# Patient Record
Sex: Female | Born: 1935 | Race: White | Hispanic: No | Marital: Married | Smoking: Former smoker
Health system: Southern US, Community
[De-identification: ages and names within clinical notes are randomized; demographics above are authoritative.]

## PROBLEM LIST (undated history)

## (undated) DIAGNOSIS — R17 Unspecified jaundice: Secondary | ICD-10-CM

## (undated) DIAGNOSIS — E039 Hypothyroidism, unspecified: Secondary | ICD-10-CM

## (undated) DIAGNOSIS — K831 Obstruction of bile duct: Secondary | ICD-10-CM

## (undated) DIAGNOSIS — I10 Essential (primary) hypertension: Secondary | ICD-10-CM

## (undated) DIAGNOSIS — M1990 Unspecified osteoarthritis, unspecified site: Secondary | ICD-10-CM

## (undated) DIAGNOSIS — E079 Disorder of thyroid, unspecified: Secondary | ICD-10-CM

## (undated) DIAGNOSIS — K5790 Diverticulosis of intestine, part unspecified, without perforation or abscess without bleeding: Secondary | ICD-10-CM

## (undated) DIAGNOSIS — T884XXA Failed or difficult intubation, initial encounter: Secondary | ICD-10-CM

## (undated) DIAGNOSIS — C259 Malignant neoplasm of pancreas, unspecified: Secondary | ICD-10-CM

## (undated) DIAGNOSIS — I341 Nonrheumatic mitral (valve) prolapse: Secondary | ICD-10-CM

## (undated) HISTORY — PX: INGUINAL HERNIA REPAIR: SUR1180

## (undated) HISTORY — DX: Diverticulosis of intestine, part unspecified, without perforation or abscess without bleeding: K57.90

## (undated) HISTORY — DX: Nonrheumatic mitral (valve) prolapse: I34.1

## (undated) HISTORY — DX: Disorder of thyroid, unspecified: E07.9

## (undated) HISTORY — DX: Malignant neoplasm of pancreas, unspecified: C25.9

## (undated) HISTORY — DX: Obstruction of bile duct: K83.1

## (undated) HISTORY — PX: BREAST CYST EXCISION: SHX579

## (undated) HISTORY — PX: TONSILLECTOMY: SUR1361

## (undated) HISTORY — PX: BREAST BIOPSY: SHX20

## (undated) HISTORY — DX: Unspecified osteoarthritis, unspecified site: M19.90

## (undated) HISTORY — DX: Essential (primary) hypertension: I10

## (undated) HISTORY — DX: Unspecified jaundice: R17

---

## 1998-01-23 ENCOUNTER — Ambulatory Visit (HOSPITAL_COMMUNITY): Admission: RE | Admit: 1998-01-23 | Discharge: 1998-01-23 | Payer: Self-pay | Admitting: Obstetrics & Gynecology

## 1998-04-10 ENCOUNTER — Other Ambulatory Visit: Admission: RE | Admit: 1998-04-10 | Discharge: 1998-04-10 | Payer: Self-pay | Admitting: Obstetrics & Gynecology

## 1999-01-15 ENCOUNTER — Other Ambulatory Visit: Admission: RE | Admit: 1999-01-15 | Discharge: 1999-01-15 | Payer: Self-pay | Admitting: Obstetrics & Gynecology

## 1999-02-05 ENCOUNTER — Encounter: Payer: Self-pay | Admitting: Family Medicine

## 1999-02-05 ENCOUNTER — Ambulatory Visit (HOSPITAL_COMMUNITY): Admission: RE | Admit: 1999-02-05 | Discharge: 1999-02-05 | Payer: Self-pay | Admitting: Family Medicine

## 1999-04-25 ENCOUNTER — Other Ambulatory Visit: Admission: RE | Admit: 1999-04-25 | Discharge: 1999-04-25 | Payer: Self-pay | Admitting: Obstetrics & Gynecology

## 2000-05-07 ENCOUNTER — Other Ambulatory Visit: Admission: RE | Admit: 2000-05-07 | Discharge: 2000-05-07 | Payer: Self-pay | Admitting: Obstetrics & Gynecology

## 2001-03-11 ENCOUNTER — Ambulatory Visit (HOSPITAL_COMMUNITY): Admission: RE | Admit: 2001-03-11 | Discharge: 2001-03-11 | Payer: Self-pay | Admitting: Obstetrics & Gynecology

## 2001-03-11 ENCOUNTER — Encounter: Payer: Self-pay | Admitting: Obstetrics & Gynecology

## 2001-03-15 ENCOUNTER — Encounter: Admission: RE | Admit: 2001-03-15 | Discharge: 2001-03-15 | Payer: Self-pay | Admitting: Obstetrics & Gynecology

## 2001-03-15 ENCOUNTER — Encounter: Payer: Self-pay | Admitting: Obstetrics & Gynecology

## 2001-06-09 ENCOUNTER — Other Ambulatory Visit: Admission: RE | Admit: 2001-06-09 | Discharge: 2001-06-09 | Payer: Self-pay | Admitting: Obstetrics & Gynecology

## 2002-03-22 ENCOUNTER — Encounter: Payer: Self-pay | Admitting: Obstetrics & Gynecology

## 2002-03-22 ENCOUNTER — Ambulatory Visit (HOSPITAL_COMMUNITY): Admission: RE | Admit: 2002-03-22 | Discharge: 2002-03-22 | Payer: Self-pay | Admitting: Obstetrics & Gynecology

## 2003-04-03 ENCOUNTER — Ambulatory Visit (HOSPITAL_COMMUNITY): Admission: RE | Admit: 2003-04-03 | Discharge: 2003-04-03 | Payer: Self-pay | Admitting: Obstetrics & Gynecology

## 2003-04-03 ENCOUNTER — Encounter: Payer: Self-pay | Admitting: Obstetrics & Gynecology

## 2003-06-15 ENCOUNTER — Other Ambulatory Visit: Admission: RE | Admit: 2003-06-15 | Discharge: 2003-06-15 | Payer: Self-pay | Admitting: Obstetrics & Gynecology

## 2004-04-08 ENCOUNTER — Ambulatory Visit (HOSPITAL_COMMUNITY): Admission: RE | Admit: 2004-04-08 | Discharge: 2004-04-08 | Payer: Self-pay | Admitting: Obstetrics & Gynecology

## 2005-04-09 ENCOUNTER — Ambulatory Visit (HOSPITAL_COMMUNITY): Admission: RE | Admit: 2005-04-09 | Discharge: 2005-04-09 | Payer: Self-pay | Admitting: Obstetrics & Gynecology

## 2005-08-05 ENCOUNTER — Other Ambulatory Visit: Admission: RE | Admit: 2005-08-05 | Discharge: 2005-08-05 | Payer: Self-pay | Admitting: Obstetrics & Gynecology

## 2006-04-14 ENCOUNTER — Ambulatory Visit (HOSPITAL_COMMUNITY): Admission: RE | Admit: 2006-04-14 | Discharge: 2006-04-14 | Payer: Self-pay | Admitting: Obstetrics & Gynecology

## 2006-04-23 ENCOUNTER — Encounter: Admission: RE | Admit: 2006-04-23 | Discharge: 2006-04-23 | Payer: Self-pay | Admitting: Obstetrics & Gynecology

## 2007-01-25 ENCOUNTER — Encounter: Admission: RE | Admit: 2007-01-25 | Discharge: 2007-01-25 | Payer: Self-pay | Admitting: Family Medicine

## 2007-04-27 ENCOUNTER — Ambulatory Visit (HOSPITAL_COMMUNITY): Admission: RE | Admit: 2007-04-27 | Discharge: 2007-04-27 | Payer: Self-pay | Admitting: Obstetrics & Gynecology

## 2008-06-04 ENCOUNTER — Ambulatory Visit (HOSPITAL_COMMUNITY): Admission: RE | Admit: 2008-06-04 | Discharge: 2008-06-04 | Payer: Self-pay | Admitting: Obstetrics & Gynecology

## 2008-08-20 ENCOUNTER — Encounter: Admission: RE | Admit: 2008-08-20 | Discharge: 2008-08-20 | Payer: Self-pay | Admitting: Family Medicine

## 2009-06-05 ENCOUNTER — Ambulatory Visit (HOSPITAL_COMMUNITY): Admission: RE | Admit: 2009-06-05 | Discharge: 2009-06-05 | Payer: Self-pay | Admitting: Obstetrics & Gynecology

## 2010-02-07 ENCOUNTER — Encounter: Admission: RE | Admit: 2010-02-07 | Discharge: 2010-02-07 | Payer: Self-pay | Admitting: Family Medicine

## 2010-03-10 ENCOUNTER — Ambulatory Visit (HOSPITAL_COMMUNITY): Admission: RE | Admit: 2010-03-10 | Discharge: 2010-03-10 | Payer: Self-pay | Admitting: General Surgery

## 2010-06-11 ENCOUNTER — Ambulatory Visit (HOSPITAL_COMMUNITY): Admission: RE | Admit: 2010-06-11 | Discharge: 2010-06-11 | Payer: Self-pay | Admitting: Obstetrics & Gynecology

## 2010-11-01 LAB — DIFFERENTIAL
Eosinophils Absolute: 0.2 10*3/uL (ref 0.0–0.7)
Eosinophils Relative: 4 % (ref 0–5)
Lymphs Abs: 1.2 10*3/uL (ref 0.7–4.0)

## 2010-11-01 LAB — CBC
HCT: 39.3 % (ref 36.0–46.0)
Hemoglobin: 13.4 g/dL (ref 12.0–15.0)
MCHC: 34 g/dL (ref 30.0–36.0)
MCV: 96.5 fL (ref 78.0–100.0)
Platelets: 222 10*3/uL (ref 150–400)
RBC: 4.07 MIL/uL (ref 3.87–5.11)
WBC: 4.7 10*3/uL (ref 4.0–10.5)

## 2010-11-01 LAB — COMPREHENSIVE METABOLIC PANEL
ALT: 30 U/L (ref 0–35)
AST: 30 U/L (ref 0–37)
BUN: 12 mg/dL (ref 6–23)
Calcium: 10.3 mg/dL (ref 8.4–10.5)
Chloride: 104 mEq/L (ref 96–112)
Creatinine, Ser: 0.84 mg/dL (ref 0.4–1.2)
GFR calc Af Amer: >60 mL/min (ref >60)
GFR calc non Af Amer: >60 mL/min (ref >60)
Glucose, Bld: 113 mg/dL — ABNORMAL HIGH (ref 70–99)
Potassium: 4.4 mEq/L (ref 3.5–5.1)
Total Protein: 7.3 g/dL (ref 6.0–8.3)

## 2010-11-01 LAB — SURGICAL PCR SCREEN: Staphylococcus aureus: NEGATIVE

## 2011-05-04 ENCOUNTER — Other Ambulatory Visit (HOSPITAL_COMMUNITY): Payer: Self-pay | Admitting: Obstetrics & Gynecology

## 2011-05-04 DIAGNOSIS — Z1231 Encounter for screening mammogram for malignant neoplasm of breast: Secondary | ICD-10-CM

## 2011-06-15 ENCOUNTER — Ambulatory Visit (HOSPITAL_COMMUNITY)
Admission: RE | Admit: 2011-06-15 | Discharge: 2011-06-15 | Disposition: A | Discharge status: Home / Self Care | Payer: PRIVATE HEALTH INSURANCE | Source: Ambulatory Visit | Attending: Obstetrics & Gynecology | Admitting: Obstetrics & Gynecology

## 2011-06-15 DIAGNOSIS — Z1231 Encounter for screening mammogram for malignant neoplasm of breast: Secondary | ICD-10-CM | POA: Insufficient documentation

## 2012-06-01 ENCOUNTER — Other Ambulatory Visit (HOSPITAL_COMMUNITY): Payer: Self-pay | Admitting: Obstetrics & Gynecology

## 2012-06-01 DIAGNOSIS — Z1231 Encounter for screening mammogram for malignant neoplasm of breast: Secondary | ICD-10-CM

## 2012-06-16 ENCOUNTER — Ambulatory Visit (HOSPITAL_COMMUNITY): Payer: 59

## 2012-06-20 ENCOUNTER — Ambulatory Visit (HOSPITAL_COMMUNITY)
Admission: RE | Admit: 2012-06-20 | Discharge: 2012-06-20 | Disposition: A | Discharge status: Home / Self Care | Payer: Medicare Other | Source: Ambulatory Visit | Attending: Obstetrics & Gynecology | Admitting: Obstetrics & Gynecology

## 2012-06-20 DIAGNOSIS — Z1231 Encounter for screening mammogram for malignant neoplasm of breast: Secondary | ICD-10-CM

## 2012-06-23 ENCOUNTER — Other Ambulatory Visit: Payer: Self-pay | Admitting: Obstetrics & Gynecology

## 2012-06-23 DIAGNOSIS — R928 Other abnormal and inconclusive findings on diagnostic imaging of breast: Secondary | ICD-10-CM

## 2012-07-01 ENCOUNTER — Ambulatory Visit
Admission: RE | Admit: 2012-07-01 | Discharge: 2012-07-01 | Disposition: A | Discharge status: Home / Self Care | Payer: PRIVATE HEALTH INSURANCE | Source: Ambulatory Visit | Attending: Obstetrics & Gynecology | Admitting: Obstetrics & Gynecology

## 2012-07-01 DIAGNOSIS — R928 Other abnormal and inconclusive findings on diagnostic imaging of breast: Secondary | ICD-10-CM

## 2013-06-05 ENCOUNTER — Encounter: Payer: Self-pay | Admitting: Physician Assistant

## 2013-06-12 ENCOUNTER — Encounter: Payer: Self-pay | Admitting: *Deleted

## 2013-06-13 ENCOUNTER — Ambulatory Visit (INDEPENDENT_AMBULATORY_CARE_PROVIDER_SITE_OTHER): Payer: Medicare Other | Admitting: Physician Assistant

## 2013-06-13 ENCOUNTER — Encounter: Payer: Self-pay | Admitting: Physician Assistant

## 2013-06-13 VITALS — BP 112/60 | HR 72 | Ht 60.0 in | Wt 96.0 lb

## 2013-06-13 DIAGNOSIS — Z9889 Other specified postprocedural states: Secondary | ICD-10-CM

## 2013-06-13 DIAGNOSIS — Z8719 Personal history of other diseases of the digestive system: Secondary | ICD-10-CM

## 2013-06-13 DIAGNOSIS — K625 Hemorrhage of anus and rectum: Secondary | ICD-10-CM

## 2013-06-13 DIAGNOSIS — K5730 Diverticulosis of large intestine without perforation or abscess without bleeding: Secondary | ICD-10-CM

## 2013-06-13 DIAGNOSIS — R1084 Generalized abdominal pain: Secondary | ICD-10-CM

## 2013-06-13 DIAGNOSIS — E039 Hypothyroidism, unspecified: Secondary | ICD-10-CM

## 2013-06-13 NOTE — Progress Notes (Signed)
Subjective:    Patient ID: Summer Jones, female    DOB: 08-Jun-1930, 79 y.o.   MRN: [id number]  HPI  Summer Jones  is a very nice 79 year old white female known to Dr. Lina Sar from previous procedures. She last had colonoscopy in June of 2005 which was done for routine screening. She was noted to have mild right and left colon diverticulosis, no polyps were found. She is generally in good health does have history of hypertension and hypothyroidism. She is referred today by Dr. Waynard Edwards for evaluation of midabdominal pain and intermittent rectal bleeding. Patient relates that she had an episode of beginning of October with a small amount of mucus and blood noted in her bowel movement and on the tissue. She says this occurred over a couple of days resolved and then recurred about a week ago. She says when symptoms started last week it was just a small amount of bright red blood in noted primarily on the tissue. She says after that she began having abdominal pain which is now been bothering her "sporadically" over the past week. She describes it as a mid abdominal aching below her belly button. She had tried Zantac on one occasion which she felt was helpful. She has not had any associated fever or chills changes in her bowel habits with melena or hematochezia. She says she has noted that she is uncomfortable with eating over the past week and that she gets a lot of gurgling and then midabdominal discomfort. Over the past few days she has not seen any blood. She has no urinary symptoms. She denies nausea and vomiting, appetite recently has been stable as has her weight. She had been seen in Dr. Laurey Morale office last week and had labs done which were unremarkable including CBC and be met. She also had a rectal exam done was noted to be Hemoccult negative.    Review of Systems  Constitutional: Negative.   HENT: Negative.   Eyes: Negative.   Respiratory: Negative.   Cardiovascular: Negative.   Gastrointestinal:  Positive for abdominal pain and anal bleeding.  Endocrine: Negative.   Genitourinary: Negative.   Musculoskeletal: Negative.   Allergic/Immunologic: Negative.   Neurological: Negative.   Hematological: Negative.   Psychiatric/Behavioral: Negative.    Outpatient Encounter Prescriptions as of 06/13/2013  Medication Sig Dispense Refill  . AMLODIPINE BESYLATE PO Take 10 mg by mouth daily.      Marland Kitchen levothyroxine (SYNTHROID, LEVOTHROID) 25 MCG tablet Take 25 mcg by mouth daily before breakfast.      . lisinopril (PRINIVIL,ZESTRIL) 5 MG tablet Take 5 mg by mouth daily.       No facility-administered encounter medications on file as of 06/13/2013.       No Known Allergies Patient Active Problem List   Diagnosis Date Noted  . Unspecified hypothyroidism 06/13/2013  . Diverticulosis of colon without hemorrhage 06/13/2013  . S/P hernia repair 06/13/2013   family history includes Breast cancer in her sister; Heart disease in her father. History  Substance Use Topics  . Smoking status: Never Smoker   . Smokeless tobacco: Not on file  . Alcohol Use: Yes     Comment: 3-4 per week     Objective:   Physical Exam Well-developed elderly white female in no acute distress, quite pleasant blood pressure 112/60 pulse 72 height 5 foot weight 96. HEENT nontraumatic normocephalic EOMI PERRLA sclera anicteric, Supple no JVD, Cardiovascular regular rate and rhythm with S1-S2 no murmur or gallop, Pulmonary clear bilaterally, Abdomen soft  nondistended no palpable mass or hepatosplenomegaly bowel sounds are somewhat hyperactive she does not have any focal abdominal tenderness no guarding or rebound, Rectal exam external exam no external hemorrhoids internal exam was not done, this was done last week and Dr. Laurey Morale  office and she was Hemoccult-negative but felt to have internal hemorrhoids, Extremities no clubbing cyanosis or edema skin warm and dry, Psych mood and affect normal and appropriate        Assessment & Plan:  #91 79 year old female with a several week history of symptoms initially with small amounts of blood and mucus in her stools and now development of intermittent mid abdominal pain x1-2 weeks. Etiology of her symptoms is unclear. She does have diverticulosis however her exam is not consistent with diverticulitis. Last colonoscopy was done 9 years ago. Will need to rule out occult colon lesion or other intra-abdominal inflammatory process  #2 hypertension #3 hypothyroidism  Plan; Will schedule for CT scan of the abdomen and pelvis with contrast. If this is unrevealing she will need colonoscopy with Dr. Juanda Chance Further plans pending results of CT.

## 2013-06-13 NOTE — Progress Notes (Deleted)
Patient ID: Summer Jones, female   DOB: 03-28-1930, 79 y.o.   MRN: [id number]

## 2013-06-13 NOTE — Progress Notes (Signed)
Reviewed and agree.

## 2013-06-13 NOTE — Patient Instructions (Signed)
  You have been scheduled for a CT scan of the abdomen and pelvis at [facility] ([address]---this is in the same building as [facility]).   You are scheduled on 06-19-2013 at 3:30 PM . You should arrive at 3:15 PM prior to your appointment time for registration. Please follow the written instructions below on the day of your exam:  WARNING: IF YOU ARE ALLERGIC TO IODINE/X-RAY DYE, PLEASE NOTIFY RADIOLOGY IMMEDIATELY AT [phone]! YOU WILL BE GIVEN A 13 HOUR PREMEDICATION PREP.  1) Do not eat or drink anything after 11:30 am (4 hours prior to your test) 2) You have been given  bottle of oral contrast to drink. The solution may taste better if refrigerated, but do NOT add ice or any other liquid to this solution. Shake well before drinking.    Drink  1/2 bottle of contrast @ 1:30 PM  (2 hours prior to your exam)  Drink 1/2 bottle of contrast @ 2:30 PM  (1 hour prior to your exam)  You may take any medications as prescribed with a small amount of water except for the following: Metformin, Glucophage, Glucovance, Avandamet, Riomet, Fortamet, Actoplus Met, Janumet, Glumetza or Metaglip. The above medications must be held the day of the exam AND 48 hours after the exam.  The purpose of you drinking the oral contrast is to aid in the visualization of your intestinal tract. The contrast solution may cause some diarrhea. Before your exam is started, you will be given a small amount of fluid to drink. Depending on your individual set of symptoms, you may also receive an intravenous injection of x-ray contrast/dye. Plan on being at [facility] for 30 minutes or long, depending on the type of exam you are having performed.  If you have any questions regarding your exam or if you need to reschedule, you may call the CT department at [phone] between the hours of 8:00 am and 5:00 pm,  Monday-Friday.  ________________________________________________________________________

## 2013-06-19 ENCOUNTER — Ambulatory Visit (INDEPENDENT_AMBULATORY_CARE_PROVIDER_SITE_OTHER)
Admission: RE | Admit: 2013-06-19 | Discharge: 2013-06-19 | Disposition: A | Discharge status: Home / Self Care | Payer: Medicare Other | Source: Ambulatory Visit | Attending: Physician Assistant | Admitting: Physician Assistant

## 2013-06-19 DIAGNOSIS — R1084 Generalized abdominal pain: Secondary | ICD-10-CM

## 2013-06-19 DIAGNOSIS — K625 Hemorrhage of anus and rectum: Secondary | ICD-10-CM

## 2013-06-19 MED ORDER — IOHEXOL 300 MG/ML  SOLN
80.0000 mL | Freq: Once | INTRAMUSCULAR | Status: AC | PRN
  Administered 2013-06-19: 80 mL via INTRAVENOUS

## 2013-08-30 ENCOUNTER — Other Ambulatory Visit (HOSPITAL_COMMUNITY): Payer: Self-pay | Admitting: Obstetrics & Gynecology

## 2013-08-30 DIAGNOSIS — Z1231 Encounter for screening mammogram for malignant neoplasm of breast: Secondary | ICD-10-CM

## 2013-09-13 ENCOUNTER — Ambulatory Visit (HOSPITAL_COMMUNITY): Payer: Medicare Other

## 2013-10-25 ENCOUNTER — Encounter: Payer: Self-pay | Admitting: Internal Medicine

## 2013-11-21 ENCOUNTER — Ambulatory Visit (HOSPITAL_COMMUNITY)
Admission: RE | Admit: 2013-11-21 | Discharge: 2013-11-21 | Disposition: A | Discharge status: Home / Self Care | Payer: Medicare Other | Source: Ambulatory Visit | Attending: Obstetrics & Gynecology | Admitting: Obstetrics & Gynecology

## 2013-11-21 DIAGNOSIS — Z1231 Encounter for screening mammogram for malignant neoplasm of breast: Secondary | ICD-10-CM | POA: Insufficient documentation

## 2013-12-26 ENCOUNTER — Ambulatory Visit (INDEPENDENT_AMBULATORY_CARE_PROVIDER_SITE_OTHER): Payer: Medicare Other | Admitting: Internal Medicine

## 2013-12-26 ENCOUNTER — Encounter: Payer: Self-pay | Admitting: Internal Medicine

## 2013-12-26 VITALS — BP 100/60 | HR 64 | Ht 60.0 in | Wt 96.0 lb

## 2013-12-26 DIAGNOSIS — Z1211 Encounter for screening for malignant neoplasm of colon: Secondary | ICD-10-CM

## 2013-12-26 NOTE — Progress Notes (Signed)
Summer Jones [birth year] [id number]  Note: This dictation was prepared with Dragon digital system. Any transcriptional errors that result from this procedure are unintentional.   History of Present Illness:  This is an 80 year old, white female who is here to discuss a screening colonoscopy. Her last colon exam in June 2005 showed diverticulosis of the left and right colon. She is asymptomatic. Her last office visit was in October 2014 with Nicoletta Ba, PA-C for acute abdominal pain and rectal bleeding. A CT scan of the abdomen showed a small hiatal hernia and diverticulosis but no acute process. She has been feeling well having normal bowel movements and having a good appetite. There is a family history of colorectal cancer in her maternal uncle and maternal aunt who were both in their 16s. Both relatives are still alive at age 70. Patient is hesitant to undergo screening colonoscopy     Past Medical History  Diagnosis Date  . Diverticular disease   . Arthritis   . Diverticulosis   . Hypertension   . Thyroid disease   . Mitral valve prolapse     Past Surgical History  Procedure Laterality Date  . Inguinal hernia repair      right  . Breast biopsy    . Tonsillectomy      No Known Allergies  Family history and social history have been reviewed.  Review of Systems:   The remainder of the 10 point ROS is negative except as outlined in the H&P  Physical Exam: General Appearance Well developed, in no distress Eyes  Non icteric  HEENT  Non traumatic, normocephalic  Mouth No lesion, tongue papillated, no cheilosis Neck Supple without adenopathy, thyroid not enlarged, no carotid bruits, no JVD Lungs Clear to auscultation bilaterally, mild kyphosis of the thoracic spine COR Normal S1, normal S2, regular rhythm, no murmur, quiet precordium Abdomen soft nontender abdomen with normal active bowel sounds Rectal small amount of formed Hemoccult-negative stool Extremities  No pedal  edema Skin No lesions Neurological Alert and oriented x 3 Psychological Normal mood and affect  Assessment and Plan:   Problem #19 80 year old white female here to discuss  screening colonoscopy. Her last exam was 10 years ago. She has no risk factors for colon cancer such colon cancer in a direct relative. She is Hemoccult negative. From my standpoint it it acceptable not to plan  a recall colonoscopy as long as she is aware of the risks of missing colon polyps or possibly a cancer.. We have discussed a high fiber diet and followup with Dr. Joylene Draft for preventive medical care. I'll be happy to see her in the future.    [name] 12/26/2013

## 2013-12-26 NOTE — Patient Instructions (Addendum)
Dr Joylene Draft

## 2015-01-21 ENCOUNTER — Other Ambulatory Visit (HOSPITAL_COMMUNITY): Payer: Self-pay | Admitting: Obstetrics & Gynecology

## 2015-01-21 DIAGNOSIS — Z1231 Encounter for screening mammogram for malignant neoplasm of breast: Secondary | ICD-10-CM

## 2015-02-06 ENCOUNTER — Ambulatory Visit (HOSPITAL_COMMUNITY)
Admission: RE | Admit: 2015-02-06 | Discharge: 2015-02-06 | Disposition: A | Discharge status: Home / Self Care | Payer: Medicare Other | Source: Ambulatory Visit | Attending: Obstetrics & Gynecology | Admitting: Obstetrics & Gynecology

## 2015-02-06 DIAGNOSIS — Z1231 Encounter for screening mammogram for malignant neoplasm of breast: Secondary | ICD-10-CM | POA: Diagnosis not present

## 2015-08-06 ENCOUNTER — Encounter (HOSPITAL_COMMUNITY): Payer: Self-pay | Admitting: *Deleted

## 2015-08-06 ENCOUNTER — Inpatient Hospital Stay (HOSPITAL_COMMUNITY)
Admission: EM | Admit: 2015-08-06 | Discharge: 2015-08-10 | DRG: 444 | Disposition: A | Discharge status: Home Health | Payer: Medicare Other | Attending: Internal Medicine | Admitting: Internal Medicine

## 2015-08-06 ENCOUNTER — Emergency Department (HOSPITAL_COMMUNITY): Payer: Medicare Other

## 2015-08-06 DIAGNOSIS — K7200 Acute and subacute hepatic failure without coma: Secondary | ICD-10-CM | POA: Diagnosis present

## 2015-08-06 DIAGNOSIS — T8386XA Thrombosis of genitourinary prosthetic devices, implants and grafts, initial encounter: Secondary | ICD-10-CM | POA: Diagnosis not present

## 2015-08-06 DIAGNOSIS — M1990 Unspecified osteoarthritis, unspecified site: Secondary | ICD-10-CM | POA: Diagnosis present

## 2015-08-06 DIAGNOSIS — D630 Anemia in neoplastic disease: Secondary | ICD-10-CM

## 2015-08-06 DIAGNOSIS — Z681 Body mass index (BMI) 19 or less, adult: Secondary | ICD-10-CM

## 2015-08-06 DIAGNOSIS — R739 Hyperglycemia, unspecified: Secondary | ICD-10-CM | POA: Diagnosis present

## 2015-08-06 DIAGNOSIS — I341 Nonrheumatic mitral (valve) prolapse: Secondary | ICD-10-CM | POA: Diagnosis present

## 2015-08-06 DIAGNOSIS — I10 Essential (primary) hypertension: Secondary | ICD-10-CM | POA: Diagnosis present

## 2015-08-06 DIAGNOSIS — R630 Anorexia: Secondary | ICD-10-CM | POA: Diagnosis present

## 2015-08-06 DIAGNOSIS — Y838 Other surgical procedures as the cause of abnormal reaction of the patient, or of later complication, without mention of misadventure at the time of the procedure: Secondary | ICD-10-CM | POA: Diagnosis not present

## 2015-08-06 DIAGNOSIS — D649 Anemia, unspecified: Secondary | ICD-10-CM | POA: Diagnosis present

## 2015-08-06 DIAGNOSIS — E43 Unspecified severe protein-calorie malnutrition: Secondary | ICD-10-CM | POA: Diagnosis present

## 2015-08-06 DIAGNOSIS — K831 Obstruction of bile duct: Principal | ICD-10-CM | POA: Diagnosis present

## 2015-08-06 DIAGNOSIS — K5900 Constipation, unspecified: Secondary | ICD-10-CM | POA: Diagnosis not present

## 2015-08-06 DIAGNOSIS — D473 Essential (hemorrhagic) thrombocythemia: Secondary | ICD-10-CM | POA: Diagnosis present

## 2015-08-06 DIAGNOSIS — R17 Unspecified jaundice: Secondary | ICD-10-CM | POA: Insufficient documentation

## 2015-08-06 DIAGNOSIS — C801 Malignant (primary) neoplasm, unspecified: Secondary | ICD-10-CM

## 2015-08-06 DIAGNOSIS — E039 Hypothyroidism, unspecified: Secondary | ICD-10-CM | POA: Diagnosis present

## 2015-08-06 DIAGNOSIS — E876 Hypokalemia: Secondary | ICD-10-CM | POA: Diagnosis present

## 2015-08-06 DIAGNOSIS — R109 Unspecified abdominal pain: Secondary | ICD-10-CM

## 2015-08-06 HISTORY — DX: Failed or difficult intubation, initial encounter: T88.4XXA

## 2015-08-06 LAB — COMPREHENSIVE METABOLIC PANEL
ALT: 361 U/L — ABNORMAL HIGH (ref 14–54)
AST: 273 U/L — AB (ref 15–41)
Albumin: 4.2 g/dL (ref 3.5–5.0)
Alkaline Phosphatase: 862 U/L — ABNORMAL HIGH (ref 38–126)
Anion gap: 11 (ref 5–15)
BUN: 8 mg/dL (ref 6–20)
CHLORIDE: 97 mmol/L — AB (ref 101–111)
CO2: 26 mmol/L (ref 22–32)
Calcium: 9.8 mg/dL (ref 8.9–10.3)
Creatinine, Ser: 0.36 mg/dL — ABNORMAL LOW (ref 0.44–1.00)
GFR calc non Af Amer: >60 mL/min (ref >60)
Glucose, Bld: 142 mg/dL — ABNORMAL HIGH (ref 65–99)
POTASSIUM: 3.6 mmol/L (ref 3.5–5.1)
Sodium: 134 mmol/L — ABNORMAL LOW (ref 135–145)
Total Bilirubin: 16.6 mg/dL — ABNORMAL HIGH (ref 0.3–1.2)
Total Protein: 7.5 g/dL (ref 6.5–8.1)

## 2015-08-06 LAB — CBC WITH DIFFERENTIAL/PLATELET
BASOS ABS: 0.1 10*3/uL (ref 0.0–0.1)
Basophils Relative: 1 %
EOS PCT: 1 %
Eosinophils Absolute: 0 10*3/uL (ref 0.0–0.7)
HCT: 31.8 % — ABNORMAL LOW (ref 36.0–46.0)
Hemoglobin: 11 g/dL — ABNORMAL LOW (ref 12.0–15.0)
LYMPHS PCT: 12 %
Lymphs Abs: 0.6 10*3/uL — ABNORMAL LOW (ref 0.7–4.0)
MCH: 32.4 pg (ref 26.0–34.0)
MCHC: 34.6 g/dL (ref 30.0–36.0)
MCV: 93.8 fL (ref 78.0–100.0)
Monocytes Absolute: 0.5 10*3/uL (ref 0.1–1.0)
Monocytes Relative: 10 %
Neutro Abs: 3.8 10*3/uL (ref 1.7–7.7)
Neutrophils Relative %: 76 %
Platelets: 411 10*3/uL — ABNORMAL HIGH (ref 150–400)
RBC: 3.39 MIL/uL — ABNORMAL LOW (ref 3.87–5.11)
RDW: 15.9 % — ABNORMAL HIGH (ref 11.5–15.5)
WBC: 5 10*3/uL (ref 4.0–10.5)

## 2015-08-06 LAB — I-STAT CHEM 8, ED
BUN: 8 mg/dL (ref 6–20)
CALCIUM ION: 1.15 mmol/L (ref 1.13–1.30)
CHLORIDE: 97 mmol/L — AB (ref 101–111)
CREATININE: 0.6 mg/dL (ref 0.44–1.00)
Glucose, Bld: 139 mg/dL — ABNORMAL HIGH (ref 65–99)
HCT: 40 % (ref 36.0–46.0)
Hemoglobin: 13.6 g/dL (ref 12.0–15.0)
POTASSIUM: 4.7 mmol/L (ref 3.5–5.1)
Sodium: 133 mmol/L — ABNORMAL LOW (ref 135–145)
TCO2: 26 mmol/L (ref 0–100)

## 2015-08-06 LAB — APTT: aPTT: 27 seconds (ref 24–37)

## 2015-08-06 LAB — PROTIME-INR
INR: 0.95 (ref 0.00–1.49)
PROTHROMBIN TIME: 12.8 s (ref 11.6–15.2)

## 2015-08-06 LAB — LIPASE, BLOOD: Lipase: 55 U/L — ABNORMAL HIGH (ref 11–51)

## 2015-08-06 LAB — BILIRUBIN, DIRECT: Bilirubin, Direct: 9.2 mg/dL — ABNORMAL HIGH (ref 0.1–0.5)

## 2015-08-06 MED ORDER — POTASSIUM CHLORIDE IN NACL 40-0.9 MEQ/L-% IV SOLN
INTRAVENOUS | Status: DC
Start: 2015-08-06 — End: 2015-08-09
  Administered 2015-08-06 – 2015-08-08 (×3): 75 mL/h via INTRAVENOUS
  Filled 2015-08-06 (×7): qty 1000

## 2015-08-06 MED ORDER — IOHEXOL 300 MG/ML  SOLN
100.0000 mL | Freq: Once | INTRAMUSCULAR | Status: AC | PRN
  Administered 2015-08-06: 100 mL via INTRAVENOUS

## 2015-08-06 MED ORDER — SODIUM CHLORIDE 0.9 % IV SOLN
INTRAVENOUS | Status: DC
  Administered 2015-08-06: 19:00:00 via INTRAVENOUS

## 2015-08-06 MED ORDER — HYDROCODONE-ACETAMINOPHEN 5-325 MG PO TABS
1.0000 | ORAL_TABLET | ORAL | Status: DC | PRN
  Administered 2015-08-09 (×2): 1 via ORAL
  Filled 2015-08-06 (×2): qty 1

## 2015-08-06 MED ORDER — LISINOPRIL 5 MG PO TABS
5.0000 mg | ORAL_TABLET | Freq: Every day | ORAL | Status: DC
  Administered 2015-08-07 – 2015-08-09 (×2): 5 mg via ORAL
  Filled 2015-08-06 (×5): qty 1

## 2015-08-06 MED ORDER — LEVOTHYROXINE SODIUM 25 MCG PO TABS
25.0000 ug | ORAL_TABLET | Freq: Every day | ORAL | Status: DC
  Administered 2015-08-09 – 2015-08-10 (×2): 25 ug via ORAL
  Filled 2015-08-06 (×5): qty 1

## 2015-08-06 MED ORDER — HEPARIN SODIUM (PORCINE) 5000 UNIT/ML IJ SOLN
5000.0000 [IU] | Freq: Three times a day (TID) | INTRAMUSCULAR | Status: DC
  Filled 2015-08-06 (×14): qty 1

## 2015-08-06 MED ORDER — POLYETHYLENE GLYCOL 3350 17 G PO PACK
17.0000 g | PACK | Freq: Every day | ORAL | Status: DC | PRN
  Filled 2015-08-06: qty 1

## 2015-08-06 MED ORDER — ONDANSETRON HCL 4 MG/2ML IJ SOLN
4.0000 mg | Freq: Four times a day (QID) | INTRAMUSCULAR | Status: DC | PRN
  Administered 2015-08-09: 4 mg via INTRAVENOUS
  Filled 2015-08-06: qty 2

## 2015-08-06 MED ORDER — ACETAMINOPHEN 325 MG PO TABS: 650.0000 mg | ORAL_TABLET | Freq: Four times a day (QID) | ORAL | Status: DC | PRN

## 2015-08-06 MED ORDER — ALUM & MAG HYDROXIDE-SIMETH 200-200-20 MG/5ML PO SUSP
30.0000 mL | Freq: Four times a day (QID) | ORAL | Status: DC | PRN
  Administered 2015-08-08 – 2015-08-09 (×2): 30 mL via ORAL
  Filled 2015-08-06 (×2): qty 30

## 2015-08-06 MED ORDER — ONDANSETRON HCL 4 MG PO TABS: 4.0000 mg | ORAL_TABLET | Freq: Four times a day (QID) | ORAL | Status: DC | PRN

## 2015-08-06 MED ORDER — AMLODIPINE BESYLATE 10 MG PO TABS
10.0000 mg | ORAL_TABLET | Freq: Every day | ORAL | Status: DC
  Administered 2015-08-07 – 2015-08-09 (×2): 10 mg via ORAL
  Filled 2015-08-06 (×5): qty 1

## 2015-08-06 MED ORDER — IOHEXOL 300 MG/ML  SOLN
50.0000 mL | Freq: Once | INTRAMUSCULAR | Status: DC | PRN
  Administered 2015-08-06: 50 mL via ORAL
  Filled 2015-08-06: qty 50

## 2015-08-06 MED ORDER — MULTIVITAMIN GUMMIES ADULT PO CHEW: 1.0000 | CHEWABLE_TABLET | Freq: Every day | ORAL | Status: DC

## 2015-08-06 MED ORDER — ACETAMINOPHEN 650 MG RE SUPP: 650.0000 mg | Freq: Four times a day (QID) | RECTAL | Status: DC | PRN

## 2015-08-06 MED ORDER — ADULT MULTIVITAMIN W/MINERALS CH
1.0000 | ORAL_TABLET | Freq: Every day | ORAL | Status: DC
  Administered 2015-08-09: 1 via ORAL
  Filled 2015-08-06 (×5): qty 1

## 2015-08-06 NOTE — H&P (Addendum)
[facility] History and Physical  Summer Jones [id number] DOB: 1930/02/10 DOA: 08/06/2015  Referring physician: ED physician PCP: [name], MD  Specialists:   Chief Complaint: Painless jaundice  HPI: Summer Jones is a generally very healthy 81 y.o. female with PMH of hypothyroidism, osteoarthritis, and mitral valve prolapse who presents to the ED at the direction of her primary care physician for evaluation of painless jaundice of 1 week's duration. Mr. Loughrey notes that she had been in her usual state of good health until 2-4 weeks ago when her appetite dropped off and she experienced some general malaise. She notes a 5-10 pound weight loss over this interval, which she attributes to her poor appetite. It was not until yesterday that the patient realized she had become jaundiced and saw her PCP for evaluation. She was sent for lab work that reportedly featured elevated LFTs and she was directed to the ED for further evaluation. She denies any abdominal pain, nausea, or vomiting. She denies fevers, chills, or easy bruising or bleeding. She consumes alcohol only occasionally and denies history of known gallstones. There is a family history of GI malignancy in two 3rd relatives.  In ED, patient was found to be afebrile, saturating well on room air, and with vital signs stable. Initial lab work feature and marked elevations in alkaline phosphatase, AST, ALT, and with total bilirubin elevated to 16.6. CBC was notable for a new normocytic anemia and thrombocytosis. Patient was sent for contrast-enhanced CT of the abdomen, revealing intra- and extrahepatic biliary dilation with abrupt cut off at the site of CBD entry into the pancreas. Dr. Hilarie Fredrickson or use of [facility] was consulted from the emergency department and recommended admission with tentative plans for endoscopic evaluation.   Where does patient live?   At home     Can patient participate in ADLs?  Yes       Review of Systems:    General: no fevers, chills, or sweats. Unintentional weight loss and anorexia. Malaise. HEENT: no blurry vision, hearing changes or sore throat might be  Pulm: no dyspnea, cough, or wheeze CV: no chest pain or palpitations Abd: no nausea, vomiting, abdominal pain, diarrhea, or constipation GU: no dysuria, hematuria, increased urinary frequency, or urgency  Ext: no leg edema Neuro: no focal weakness, numbness, or tingling, no vision change or hearing loss Skin: no rash, no wounds. Jaundiced throughout  MSK: No muscle spasm, no deformity, no red, hot, or swollen joint Heme: No easy bruising or bleeding Travel history: No recent long distant travel    Allergy: No Known Allergies  Past Medical History  Diagnosis Date  . Diverticular disease   . Arthritis   . Diverticulosis   . Hypertension   . Thyroid disease   . Mitral valve prolapse     Past Surgical History  Procedure Laterality Date  . Inguinal hernia repair      right  . Breast biopsy    . Tonsillectomy      Social History:  reports that she has never smoked. She has never used smokeless tobacco. She reports that she drinks alcohol. She reports that she does not use illicit drugs.  Family History:  Family History  Problem Relation Age of Onset  . Breast cancer Sister   . Heart disease Father      Prior to Admission medications   Medication Sig Start Date End Date Taking? Authorizing Provider  amLODipine (NORVASC) 10 MG tablet  06/16/15  Yes Historical Provider, MD  levothyroxine (SYNTHROID, LEVOTHROID) 25 MCG tablet Take 25 mcg by mouth daily before breakfast.   Yes Historical Provider, MD  lisinopril (PRINIVIL,ZESTRIL) 5 MG tablet Take 5 mg by mouth daily.   Yes Historical Provider, MD  Multiple Vitamins-Minerals (MULTIVITAMIN GUMMIES ADULT) CHEW Chew 1 tablet by mouth daily.   Yes Historical Provider, MD    Physical Exam: Filed Vitals:   08/06/15 1353 08/06/15 1821  BP: 142/64 161/86  Pulse: 82 78  Temp:  97.7 F (36.5 C)   TempSrc: Oral   Resp: 18 16  SpO2: 98% 98%   General: Not in acute distress HEENT:       Eyes: PERRL, EOMI, no conjunctival pallor. icteric sclerae       ENT: No discharge from the ears or nose, no pharyngeal ulcers, petechiae or exudate, no tonsillar enlargement.        Neck: No bruit, no appreciable mass. Modest neck vein distention  Heme: No cervical adenopathy, no pallor Cardiac: S1/S2, RRR, No murmurs, No gallops or rubs. Pulm: Good air movement bilaterally. No rales, wheezing, rhonchi or rubs. Abd: Soft, nondistended, nontender, no rebound pain or gaurding, no mass or organomegaly, BS present. Ext: No LE edema bilaterally. 2+DP/PT pulse bilaterally. Musculoskeletal: No gross deformity, no red, hot, swollen joints, no limitation in ROM  Skin: No rashes or wounds on exposed surfaces. Jaundiced throughout   Neuro: Alert, oriented X3, cranial nerves II-XII grossly intact. No focal findings Psych: Patient is not overtly psychotic.  Labs on Admission:  Basic Metabolic Panel:  Recent Labs Lab 08/06/15 1428 08/06/15 1429  NA 133* 134*  K 4.7 3.6  CL 97* 97*  CO2  --  26  GLUCOSE 139* 142*  BUN 8 8  CREATININE 0.60 0.36*  CALCIUM  --  9.8   Liver Function Tests:  Recent Labs Lab 08/06/15 1429  AST 273*  ALT 361*  ALKPHOS 862*  BILITOT 16.6*  PROT 7.5  ALBUMIN 4.2    Recent Labs Lab 08/06/15 1429  LIPASE 55*   No results for input(s): AMMONIA in the last 168 hours. CBC:  Recent Labs Lab 08/06/15 1428 08/06/15 1429  WBC  --  5.0  NEUTROABS  --  3.8  HGB 13.6 11.0*  HCT 40.0 31.8*  MCV  --  93.8  PLT  --  411*   Cardiac Enzymes: No results for input(s): CKTOTAL, CKMB, CKMBINDEX, TROPONINI in the last 168 hours.  BNP (last 3 results) No results for input(s): BNP in the last 8760 hours.  ProBNP (last 3 results) No results for input(s): PROBNP in the last 8760 hours.  CBG: No results for input(s): GLUCAP in the last 168  hours.  Radiological Exams on Admission: Ct Abdomen Pelvis W Contrast  08/06/2015  CLINICAL DATA:  Increasing fatigue since October. Yesterday patient becmae jaundiced. Elevated liver function studies. EXAM: CT ABDOMEN AND PELVIS WITH CONTRAST TECHNIQUE: Multidetector CT imaging of the abdomen and pelvis was performed using the standard protocol following bolus administration of intravenous contrast. CONTRAST:  139mL OMNIPAQUE IOHEXOL 300 MG/ML  SOLN COMPARISON:  CT scan 06/19/2013 FINDINGS: Lower chest: The lung bases are clear of acute process. No pleural effusion or pulmonary nodules. The heart is normal in size for age. No pericardial effusion. The small hiatal hernia is noted. Hepatobiliary: Significant intrahepatic biliary dilatation. The common hepatic duct is also dilated and there is an abrupt cut off of the common bile duct as it enters the pancreatic head area. Findings could be due to a  common bile duct stone, biliary tumor (cholangiocarcinoma) or possibly a pancreatic lesion. I believe a biliary tumor is most likely. The pancreatic duct is normal and I do not see an obvious pancreatic mass. MRI abdomen/ MRCP may be helpful for further evaluation if the patient can hold her breath adequately. Otherwise, ERCP may be helpful for both diagnostic and therapeutic purposes if the obstruction can be crossed. No focal hepatic lesions are identified. The portal and hepatic veins are patent. Pancreas: No definite pancreatic mass or inflammation. The pancreatic duct is normal in caliber and has a normal course. Spleen: Normal size.  No focal lesions. Adrenals/Urinary Tract: The adrenal glands and kidneys are normal. Stomach/Bowel: The stomach, duodenum, small bowel and colon are unremarkable. No inflammatory changes, mass lesions or obstructive findings. Diffuse colonic diverticulosis but no findings for acute diverticulitis. The terminal ileum is normal. The appendix is normal. Vascular/Lymphatic: No  mesenteric or retroperitoneal mass or adenopathy. Specifically, I do not see any portal or periportal mass or adenopathy. Scattered atherosclerotic calcifications involving the aorta but fairly minimal for age. The major venous structures are patent. Reproductive: The uterus and ovaries are normal. Other: The bladder is normal. No pelvic mass, adenopathy or free pelvic fluid collections. No inguinal mass or adenopathy. Musculoskeletal: Left convex thoracolumbar scoliosis and associated degenerative lumbar spondylosis. No acute bony findings or destructive bony changes. IMPRESSION: 1. Intra and extrahepatic biliary dilatation with an abrupt cut off of the common bile duct near its entry into the pancreatic head. Findings most suspicious for a biliary tumor (cholangiocarcinoma) but a common bile duct stone and pancreatic lesion are also possible. MRCP may be helpful for further evaluation if the patient can hold her breath. Otherwise, ERCP is suggested for both diagnostic and therapeutic purposes. 2. Moderate distention of the gallbladder without obvious gallstones. 3. No liver lesions or abdominal lymphadenopathy. Electronically Signed   By: Marijo Sanes M.D.   On: 08/06/2015 17:34    EKG:   Not done in ED, will obtain as appropriate   Assessment/Plan  1.  Painless jaundice, biliary obstruction  - CT abdomen demonstrates intra- and extrahepatic biliary dilatation with abrupt cut off with her CBD enters head of pancreas  - Unfortunately, the differential in this case is dominated by malignancy, possibly cholangiocarcinoma or pancreatic cancer, with obstructing stone less likely -  GI was consulted from the emergency department and much appreciated, recommending admission to the hospital for further workup - Supportive care, trend LFTs, check INR - We'll keep nothing by mouth after midnight for possible endoscopy  2. Normocytic anemia  - Mild, normocytic, and not noted on prior labs - We will  monitor, need for transfusion is not anticipated  3. Hypokalemia  - Mild, checking magnesium, replacing  4.  Hyperglycemia - Patient is not a known diabetic - Check A1c and repeat CBG in the a.m.  5. Hypertension  - There is a modest elevation in blood pressure since admission - We'll continue home dose lisinopril and Norvasc - Monitor, adjust regimen as needed  6. Hypothyroidism  - Continue home dose Synthroid, with monitoring  7. Weight loss, underweight - PMI less than 18.5, patient complains of loss of appetite and recent 5-10 pound weight loss - Likely secondary to #1 - Consult dietary   DVT ppx: SQ Heparin     Code Status: Full code Family Communication:  Yes, patient's husband at bed side Disposition Plan: Admit to inpatient   Date of Service 08/06/2015    Ilene Qua  Bob Eastwood, MD [facility] Pager [phone]  If 7PM-7AM, please contact night-coverage [email or web address] Password [facility] [place] 08/06/2015, 7:33 PM

## 2015-08-06 NOTE — ED Provider Notes (Signed)
Patient's care has been assumed for Dr. Tyrone Nine. CT does show an obstructive biliary duct mass or possibly stone. Patient has significant elevated LFTs and total bilirubin. She is alert and appropriate. She has no respiratory distress. Her mental status is clear. Patient has a nontender abdominal examination. [place] [facility] was consulted. The patient will be further evaluated in the morning. [facility] consult for admission.  Charlesetta Shanks, MD 08/06/15 [phone]

## 2015-08-06 NOTE — ED Notes (Addendum)
Pt being sent over by Dr Letta Median from [facility] for painless jaundice. Bilirubin >16, AST/ALT 300, alk phos 1000.   Upon rn assessment, pt reports she has noticed her skin has been yellow for 1 week. Denies pain. Went to pcp today due to skin being yellow. He sent her to ED due to elevated blood work results. Blood work results have been faxed over.

## 2015-08-06 NOTE — ED Notes (Signed)
Patient comes from home with c/o jaundice and icterus.  Patient states she has not felt well since the beginning of October, with increased fatigue.  Patient had a check-up with PCP in October and states her lab values were "fine."  Patient states yesterday she noticed her skin was yellow.  She called her PCP and had labs done there today.  Her "liver function" was elevated and she was directed to ED.  Patient denies N/V/D, fever and pain in abdomen or elsewhere.  Patient's abdomen is soft and non-tender to palpation.  She is jaundiced and with scleral icterus.

## 2015-08-06 NOTE — Progress Notes (Signed)
Chaplain was paged at [place] by [facility] ED. When chaplain arrived he was told that Ms Summer Jones was moved to [phone]. Chaplain visit found that Ms Summer Jones did not request a chaplain nor did she know why the chaplain was paged. Floor staff were unaware of the reason for the page.  Should pt need or request spiritual/emotional care please page the chaplain immediately.  Summer Jones. Summer Jones, DMin. MDiv Chaplain

## 2015-08-06 NOTE — ED Notes (Signed)
Report given to floor nurse

## 2015-08-06 NOTE — ED Provider Notes (Signed)
CSN: [id number]     Arrival date & time 08/06/15  1336 History   First MD Initiated Contact with Patient 08/06/15 1401     Chief Complaint  Patient presents with  . Elevated Labs      (Consider location/radiation/quality/duration/timing/severity/associated sxs/prior Treatment) Patient is a 81 y.o. female presenting with general illness. The history is provided by the patient.  Illness Severity:  Mild Onset quality:  Gradual Duration:  2 weeks Timing:  Constant Progression:  Unchanged Chronicity:  New Associated symptoms: no chest pain, no congestion, no fever, no headaches, no myalgias, no nausea, no rhinorrhea, no shortness of breath, no vomiting and no wheezing    81 yo F with a chief complaint of generalized jaundiced. This been going on for the past couple weeks. Patient denies any abdominal pain vomiting fevers or chills. Patient went to see her family doctor today was found to have a increased bilirubin as well as an elevated liver enzymes and alkaline phosphatase. Sent for further evaluation.   Past Medical History  Diagnosis Date  . Diverticular disease   . Arthritis   . Diverticulosis   . Hypertension   . Thyroid disease   . Mitral valve prolapse    Past Surgical History  Procedure Laterality Date  . Inguinal hernia repair      right  . Breast biopsy    . Tonsillectomy     Family History  Problem Relation Age of Onset  . Breast cancer Sister   . Heart disease Father    Social History  Substance Use Topics  . Smoking status: Never Smoker   . Smokeless tobacco: Never Used  . Alcohol Use: Yes     Comment: 3-4 per week    OB History    No data available     Review of Systems  Constitutional: Negative for fever and chills.  HENT: Negative for congestion and rhinorrhea.   Eyes: Negative for redness and visual disturbance.  Respiratory: Negative for shortness of breath and wheezing.   Cardiovascular: Negative for chest pain and palpitations.   Gastrointestinal: Negative for nausea and vomiting.  Genitourinary: Negative for dysuria and urgency.  Musculoskeletal: Negative for myalgias and arthralgias.  Skin: Positive for color change. Negative for pallor and wound.  Neurological: Negative for dizziness and headaches.      Allergies  Review of patient's allergies indicates no known allergies.  Home Medications   Prior to Admission medications   Medication Sig Start Date End Date Taking? Authorizing Provider  AMLODIPINE BESYLATE PO Take 10 mg by mouth daily.    Historical Provider, MD  levothyroxine (SYNTHROID, LEVOTHROID) 25 MCG tablet Take 25 mcg by mouth daily before breakfast.    Historical Provider, MD  lisinopril (PRINIVIL,ZESTRIL) 5 MG tablet Take 5 mg by mouth daily.    Historical Provider, MD   BP 142/64 mmHg  Pulse 82  Temp(Src) 97.7 F (36.5 C) (Oral)  Resp 18  SpO2 98% Physical Exam  Constitutional: She is oriented to person, place, and time. She appears well-developed and well-nourished. No distress.  HENT:  Head: Normocephalic and atraumatic.  Eyes: EOM are normal. Pupils are equal, round, and reactive to light.  Neck: Normal range of motion. Neck supple.  Cardiovascular: Normal rate and regular rhythm.  Exam reveals no gallop and no friction rub.   No murmur heard. Pulmonary/Chest: Effort normal. She has no wheezes. She has no rales.  Abdominal: Soft. She exhibits no distension. There is no tenderness.  Musculoskeletal: She exhibits no  edema or tenderness.  Neurological: She is alert and oriented to person, place, and time.  Skin: Skin is warm and dry. She is not diaphoretic.  jaundiced  Psychiatric: She has a normal mood and affect. Her behavior is normal.  Nursing note and vitals reviewed.   ED Course  Procedures (including critical care time) Labs Review Labs Reviewed - No data to display  Imaging Review No results found. I have personally reviewed and evaluated these images and lab  results as part of my medical decision-making.   EKG Interpretation None      MDM   Final diagnoses:  Common biliary duct obstruction    81 yo F with a chief complaints of jaundice. This is painless. No noted abnormality on abdominal exam. Will obtain a CT scan to further evaluate.  Discussed with patient the major concern is that this is malignancy. Awaiting CT scan.  Patient care turned over to Dr. Johnney Killian.  The patients results and plan were reviewed and discussed.   Any x-rays performed were independently reviewed by myself.   Differential diagnosis were considered with the presenting HPI.  Medications  iohexol (OMNIPAQUE) 300 MG/ML solution 50 mL (50 mLs Oral Contrast Given 08/06/15 1519)  amLODipine (NORVASC) tablet 10 mg (10 mg Oral Not Given 08/06/15 2145)  levothyroxine (SYNTHROID, LEVOTHROID) tablet 25 mcg (not administered)  lisinopril (PRINIVIL,ZESTRIL) tablet 5 mg (5 mg Oral Not Given 08/06/15 2145)  heparin injection 5,000 Units (5,000 Units Subcutaneous Not Given 08/07/15 0555)  0.9 % NaCl with KCl 40 mEq / L  infusion (75 mL/hr Intravenous New Bag/Given 08/06/15 2145)  acetaminophen (TYLENOL) tablet 650 mg (not administered)    Or  acetaminophen (TYLENOL) suppository 650 mg (not administered)  HYDROcodone-acetaminophen (NORCO/VICODIN) 5-325 MG per tablet 1-2 tablet (not administered)  polyethylene glycol (MIRALAX / GLYCOLAX) packet 17 g (not administered)  ondansetron (ZOFRAN) tablet 4 mg (not administered)    Or  ondansetron (ZOFRAN) injection 4 mg (not administered)  alum & mag hydroxide-simeth (MAALOX/MYLANTA) 200-200-20 MG/5ML suspension 30 mL (not administered)  multivitamin with minerals tablet 1 tablet (1 tablet Oral Not Given 08/06/15 2145)  iohexol (OMNIPAQUE) 300 MG/ML solution 100 mL (100 mLs Intravenous Contrast Given 08/06/15 1706)    Filed Vitals:   08/06/15 1353 08/06/15 1821 08/06/15 2018 08/07/15 0650  BP: 142/64 161/86 154/87 138/86   Pulse: 82 78 82 92  Temp: 97.7 F (36.5 C)  97.7 F (36.5 C) 98 F (36.7 C)  TempSrc: Oral  Oral Oral  Resp: 18 16 18 18   Height:   5' (1.524 m)   Weight:   93 lb 11.1 oz (42.5 kg)   SpO2: 98% 98% 100% 95%    Final diagnoses:  Common biliary duct obstruction     Deno Etienne, DO 08/07/15 0900

## 2015-08-07 ENCOUNTER — Other Ambulatory Visit: Payer: Self-pay

## 2015-08-07 ENCOUNTER — Telehealth: Payer: Self-pay

## 2015-08-07 DIAGNOSIS — R17 Unspecified jaundice: Secondary | ICD-10-CM

## 2015-08-07 LAB — COMPREHENSIVE METABOLIC PANEL
ALBUMIN: 3.5 g/dL (ref 3.5–5.0)
ALT: 324 U/L — AB (ref 14–54)
AST: 249 U/L — AB (ref 15–41)
Alkaline Phosphatase: 688 U/L — ABNORMAL HIGH (ref 38–126)
Anion gap: 10 (ref 5–15)
BUN: 8 mg/dL (ref 6–20)
CHLORIDE: 103 mmol/L (ref 101–111)
CO2: 23 mmol/L (ref 22–32)
CREATININE: 0.38 mg/dL — AB (ref 0.44–1.00)
Calcium: 9.4 mg/dL (ref 8.9–10.3)
GFR calc Af Amer: >60 mL/min (ref >60)
GFR calc non Af Amer: >60 mL/min (ref >60)
GLUCOSE: 90 mg/dL (ref 65–99)
POTASSIUM: 4.4 mmol/L (ref 3.5–5.1)
Sodium: 136 mmol/L (ref 135–145)
Total Bilirubin: 15.5 mg/dL — ABNORMAL HIGH (ref 0.3–1.2)
Total Protein: 6.3 g/dL — ABNORMAL LOW (ref 6.5–8.1)

## 2015-08-07 LAB — GLUCOSE, CAPILLARY: Glucose-Capillary: 89 mg/dL (ref 65–99)

## 2015-08-07 LAB — CBC
HEMATOCRIT: 29.8 % — AB (ref 36.0–46.0)
Hemoglobin: 10.3 g/dL — ABNORMAL LOW (ref 12.0–15.0)
MCH: 32.6 pg (ref 26.0–34.0)
MCHC: 34.6 g/dL (ref 30.0–36.0)
MCV: 94.3 fL (ref 78.0–100.0)
Platelets: 376 10*3/uL (ref 150–400)
RBC: 3.16 MIL/uL — ABNORMAL LOW (ref 3.87–5.11)
RDW: 16.1 % — AB (ref 11.5–15.5)
WBC: 4.5 10*3/uL (ref 4.0–10.5)

## 2015-08-07 LAB — TSH: TSH: 3.776 u[IU]/mL (ref 0.350–4.500)

## 2015-08-07 NOTE — Progress Notes (Signed)
[facility] PROGRESS NOTE  Summer Jones [id number] DOB: 21-May-1930 DOA: 08/06/2015 PCP: [name], MD  HPI/Brief narrative 81 y.o. female with PMH of hypothyroidism, osteoarthritis, and mitral valve prolapse who presents to the ED at the direction of her primary care physician, Dr. Joylene Draft, for evaluation of painless jaundice.  Assessment/Plan: 1. Painless jaundice, biliary obstruction  - CT abdomen demonstrates intra- and extrahepatic biliary dilatation with abrupt cut off with her CBD enters head of pancreas  - [facility] was consulted from the emergency department. Discuss with gastroenterology. Plans for EUS and possible ERCP on 08/08/2015  2. Normocytic anemia  - Mild, normocytic, and not noted on prior labs - We will monitor, need for transfusion is not anticipated  3. Hypokalemia  - Continue to replace as needed  4. Hyperglycemia - Patient is not a known diabetic - Check A1c and repeat CBG in the a.m.  5. Hypertension  - There is a modest elevation in blood pressure since admission - We'll continue home dose lisinopril and Norvasc - Monitor, adjust regimen as needed  6. Hypothyroidism  - Continue home dose Synthroid, with monitoring  7. Weight loss, underweight - PMI less than 18.5, patient complains of loss of appetite and recent 5-10 pound weight loss -Nutrition consulted  Code Status: Full Family Communication: Pt in room Disposition Plan: Home when cleared by GI   Consultants:  GI  Procedures:    Antibiotics: Anti-infectives    None      HPI/Subjective: Patient denies abdominal pain or nausea.  Objective: Filed Vitals:   08/06/15 1821 08/06/15 2018 08/07/15 0650 08/07/15 1402  BP: 161/86 154/87 138/86 128/74  Pulse: 78 82 92 73  Temp:  97.7 F (36.5 C) 98 F (36.7 C) 98.2 F (36.8 C)  TempSrc:  Oral Oral Oral  Resp: 16 18 18 17   Height:  5' (1.524 m)    Weight:  42.5 kg (93 lb 11.1 oz)    SpO2: 98% 100% 95% 98%     Intake/Output Summary (Last 24 hours) at 08/07/15 1707 Last data filed at 08/07/15 1237  Gross per 24 hour  Intake 1098.75 ml  Output      0 ml  Net 1098.75 ml   Filed Weights   08/06/15 2018  Weight: 42.5 kg (93 lb 11.1 oz)    Exam:   General:  Awake, in nad  Cardiovascular: regular, s1, s2  Respiratory: normal resp effort, no wheezing  Abdomen: soft,nondistended  Musculoskeletal: perfused, no clubbing   Data Reviewed: Basic Metabolic Panel:  Recent Labs Lab 08/06/15 1428 08/06/15 1429 08/07/15 0515  NA 133* 134* 136  K 4.7 3.6 4.4  CL 97* 97* 103  CO2  --  26 23  GLUCOSE 139* 142* 90  BUN 8 8 8   CREATININE 0.60 0.36* 0.38*  CALCIUM  --  9.8 9.4   Liver Function Tests:  Recent Labs Lab 08/06/15 1429 08/07/15 0515  AST 273* 249*  ALT 361* 324*  ALKPHOS 862* 688*  BILITOT 16.6* 15.5*  PROT 7.5 6.3*  ALBUMIN 4.2 3.5    Recent Labs Lab 08/06/15 1429  LIPASE 55*   No results for input(s): AMMONIA in the last 168 hours. CBC:  Recent Labs Lab 08/06/15 1428 08/06/15 1429 08/07/15 0515  WBC  --  5.0 4.5  NEUTROABS  --  3.8  --   HGB 13.6 11.0* 10.3*  HCT 40.0 31.8* 29.8*  MCV  --  93.8 94.3  PLT  --  411* 376  Cardiac Enzymes: No results for input(s): CKTOTAL, CKMB, CKMBINDEX, TROPONINI in the last 168 hours. BNP (last 3 results) No results for input(s): BNP in the last 8760 hours.  ProBNP (last 3 results) No results for input(s): PROBNP in the last 8760 hours.  CBG:  Recent Labs Lab 08/07/15 0738  GLUCAP 89    No results found for this or any previous visit (from the past 240 hour(s)).   Studies: Ct Abdomen Pelvis W Contrast  08/06/2015  CLINICAL DATA:  Increasing fatigue since October. Yesterday patient becmae jaundiced. Elevated liver function studies. EXAM: CT ABDOMEN AND PELVIS WITH CONTRAST TECHNIQUE: Multidetector CT imaging of the abdomen and pelvis was performed using the standard protocol following bolus  administration of intravenous contrast. CONTRAST:  179mL OMNIPAQUE IOHEXOL 300 MG/ML  SOLN COMPARISON:  CT scan 06/19/2013 FINDINGS: Lower chest: The lung bases are clear of acute process. No pleural effusion or pulmonary nodules. The heart is normal in size for age. No pericardial effusion. The small hiatal hernia is noted. Hepatobiliary: Significant intrahepatic biliary dilatation. The common hepatic duct is also dilated and there is an abrupt cut off of the common bile duct as it enters the pancreatic head area. Findings could be due to a common bile duct stone, biliary tumor (cholangiocarcinoma) or possibly a pancreatic lesion. I believe a biliary tumor is most likely. The pancreatic duct is normal and I do not see an obvious pancreatic mass. MRI abdomen/ MRCP may be helpful for further evaluation if the patient can hold her breath adequately. Otherwise, ERCP may be helpful for both diagnostic and therapeutic purposes if the obstruction can be crossed. No focal hepatic lesions are identified. The portal and hepatic veins are patent. Pancreas: No definite pancreatic mass or inflammation. The pancreatic duct is normal in caliber and has a normal course. Spleen: Normal size.  No focal lesions. Adrenals/Urinary Tract: The adrenal glands and kidneys are normal. Stomach/Bowel: The stomach, duodenum, small bowel and colon are unremarkable. No inflammatory changes, mass lesions or obstructive findings. Diffuse colonic diverticulosis but no findings for acute diverticulitis. The terminal ileum is normal. The appendix is normal. Vascular/Lymphatic: No mesenteric or retroperitoneal mass or adenopathy. Specifically, I do not see any portal or periportal mass or adenopathy. Scattered atherosclerotic calcifications involving the aorta but fairly minimal for age. The major venous structures are patent. Reproductive: The uterus and ovaries are normal. Other: The bladder is normal. No pelvic mass, adenopathy or free pelvic fluid  collections. No inguinal mass or adenopathy. Musculoskeletal: Left convex thoracolumbar scoliosis and associated degenerative lumbar spondylosis. No acute bony findings or destructive bony changes. IMPRESSION: 1. Intra and extrahepatic biliary dilatation with an abrupt cut off of the common bile duct near its entry into the pancreatic head. Findings most suspicious for a biliary tumor (cholangiocarcinoma) but a common bile duct stone and pancreatic lesion are also possible. MRCP may be helpful for further evaluation if the patient can hold her breath. Otherwise, ERCP is suggested for both diagnostic and therapeutic purposes. 2. Moderate distention of the gallbladder without obvious gallstones. 3. No liver lesions or abdominal lymphadenopathy. Electronically Signed   By: Marijo Sanes M.D.   On: 08/06/2015 17:34    Scheduled Meds: . amLODipine  10 mg Oral Daily  . heparin  5,000 Units Subcutaneous 3 times per day  . levothyroxine  25 mcg Oral QAC breakfast  . lisinopril  5 mg Oral Daily  . multivitamin with minerals  1 tablet Oral Daily   Continuous Infusions: . 0.9 %  NaCl with KCl 40 mEq / L 75 mL/hr (08/07/15 1101)    Principal Problem:   Biliary obstruction Active Problems:   Liver failure, acute   Anemia   Hypokalemia   Hyperglycemia   Common biliary duct obstruction   CHIU, STEPHEN K  [facility] Pager [phone]. If 7PM-7AM, please contact night-coverage at [email or web address], password [facility] 08/07/2015, 5:07 PM  LOS: 1 day

## 2015-08-07 NOTE — Evaluation (Signed)
Physical Therapy Evaluation Patient Details Name: Summer Jones MRN: [id number] DOB: June 17, 1930 Today's Date: 08/07/2015   History of Present Illness  Findings most suspicious for a biliary tumor (cholangiocarcinoma85 yo female admitted 12/20 with painless jaundice. CT of abdomen revealed Intra and extrahepatic biliary dilatation with an abrupt cut off. Most suspicious for biliary carcinoma.  Clinical Impression  Patient is very mobile and requires no assistance. No further PT needs. Patient  May ambulate with family.    Follow Up Recommendations No PT follow up    Equipment Recommendations  None recommended by PT    Recommendations for Other Services       Precautions / Restrictions Precautions Precautions: None      Mobility  Bed Mobility Overal bed mobility: Independent                Transfers Overall transfer level: Independent                  Ambulation/Gait Ambulation/Gait assistance: Independent Ambulation Distance (Feet): 400 Feet Assistive device: None Gait Pattern/deviations: WFL(Within Functional Limits)   Gait velocity interpretation: at or above normal speed for age/gender    Stairs            Wheelchair Mobility    Modified Rankin (Stroke Patients Only)       Balance Overall balance assessment: Independent                                           Pertinent Vitals/Pain Pain Assessment: No/denies pain    Home Living Family/patient expects to be discharged to:: Private residence Living Arrangements: Spouse/significant other Available Help at Discharge: Family;Available 24 hours/day Type of Home: House Home Access: Level entry     Home Layout: One level Home Equipment: None      Prior Function Level of Independence: Independent               Hand Dominance        Extremity/Trunk Assessment   Upper Extremity Assessment: Overall WFL for tasks assessed           Lower Extremity  Assessment: Overall WFL for tasks assessed      Cervical / Trunk Assessment: Kyphotic  Communication   Communication: No difficulties  Cognition Arousal/Alertness: Awake/alert Behavior During Therapy: WFL for tasks assessed/performed Overall Cognitive Status: Within Functional Limits for tasks assessed                      General Comments      Exercises        Assessment/Plan    PT Assessment Patent does not need any further PT services  PT Diagnosis     PT Problem List    PT Treatment Interventions     PT Goals (Current goals can be found in the Care Plan section) Acute Rehab PT Goals Patient Stated Goal: to go home soon PT Goal Formulation: All assessment and education complete, DC therapy    Frequency     Barriers to discharge        Co-evaluation               End of Session   Activity Tolerance: Patient tolerated treatment well Patient left: in bed;with call bell/phone within reach;with family/visitor present Nurse Communication: Mobility status         Time: 1110-1120 PT Time Calculation (  min) (ACUTE ONLY): 10 min   Charges:   PT Evaluation $Initial PT Evaluation Tier I: 1 Procedure     PT G CodesClaretha Cooper 08/07/2015, 12:37 PM Tresa Endo PT [phone]

## 2015-08-07 NOTE — Progress Notes (Signed)
Initial Nutrition Assessment  DOCUMENTATION CODES:   Underweight, Severe malnutrition in context of acute illness/injury  INTERVENTION:  -Ensure Enlive po BID, each supplement provides 350 kcal and 20 grams of protein   NUTRITION DIAGNOSIS:   Malnutrition related to poor appetite as evidenced by severe depletion of muscle mass, severe depletion of body fat.  GOAL:   Patient will meet greater than or equal to 90% of their needs  MONITOR:   PO intake, Supplement acceptance, I & O's, Labs  REASON FOR ASSESSMENT:   Consult, Malnutrition Screening Tool Assessment of nutrition requirement/status  ASSESSMENT:   Summer Jones is a generally very healthy 81 y.o. female with PMH of hypothyroidism, osteoarthritis, and mitral valve prolapse who presents to the ED at the direction of her primary care physician for evaluation of painless jaundice of 1 week's duration. Summer Jones notes that she had been in her usual state of good health until 2-4 weeks ago when her appetite dropped off and she experienced some general malaise. She notes a 5-10 pound weight loss over this interval, which she attributes to her poor appetite. It was not until yesterday that the patient realized she had become jaundiced and saw her PCP for evaluation. She was sent for lab work that reportedly featured elevated LFTs and she was directed to the ED for further evaluation.   Spoke with pt, sons, and husband at bedside. Pt reports her appetite has been ok, with the onset of illness it was poor. Pt reports 5-10# wt loss with 2-4 weeks of illness. Per chart, her previous admission had her at 96# in 2015 and 2014.  She states she does not eat much at home because she has to cook it there, but that she has been eating better here.  RD convinced pt to try to drink ensure during stay to help her get some extra calories in and hopefully rebuild muscle. Encouraged intake of Boost/Ensure at home when she doesn't want to  eat.  Pt said she has been 98# for many years.  Nutrition-Focused physical exam completed. Findings are severe fat depletion, severe muscle depletion, and no edema.   Labs: LFTs 249, 324, Alk Phos 688 Medications reviewed.  Diet Order:  Diet regular Room service appropriate?: Yes; Fluid consistency:: Thin Diet NPO time specified  Skin:  Reviewed, no issues  Last BM:  08/07/2015  Height:   Ht Readings from Last 1 Encounters:  08/06/15 5' (1.524 m)    Weight:   Wt Readings from Last 1 Encounters:  08/06/15 93 lb 11.1 oz (42.5 kg)    Ideal Body Weight:  45.45 kg  BMI:  Body mass index is 18.3 kg/(m^2).  Estimated Nutritional Needs:   Kcal:  1600-1900 calories  Protein:  50-60 grams  Fluid:  Per MD  EDUCATION NEEDS:   No education needs identified at this time  Summer Jones. Summer Frimpong, MS, RD LDN After Hours/Weekend Pager [phone]

## 2015-08-07 NOTE — Telephone Encounter (Signed)
-----   Message from [name], MD sent at 08/06/2015  7:52 PM EST ----- New consult at [facility], added to list.   85 in ER being admitted by hospitalist with painless jaundice, tbili 16.  CT shows dilated biliary tree (?pancreatic or biliary tumor).    I think she is going to need ERCP and possibly EUS as well. I can do those both for her on Thursday.    Chong Sicilian, can you coordinate with the inpatient team about adding her on for EUS/ERCP Thursday with MAC sedation.  Veena, let me know if that plan sounds reasonable after you guys see her.  Thanks

## 2015-08-07 NOTE — Consult Note (Signed)
Referring Provider: EDP Primary Care Physician:  [name], MD Primary Gastroenterologist:  Dr. Olevia Perches  Reason for Consultation:  Painless jaundice  HPI: Summer Jones is a 81 y.o. female with PMH of hypothyroidism, osteoarthritis, and mitral valve prolapse who presents to the ED at the direction of her primary care physician, Dr. Joylene Draft, for evaluation of painless jaundice. Mr. Cana notes that she had been in her usual state of good health until 2-4 weeks ago when her appetite dropped off and she experienced some general malaise. She notes a 5-10 pound weight loss over this interval, which she attributes to her poor appetite. It was not until yesterday that the patient realized she had become jaundiced and saw her PCP for evaluation. She was sent for lab work that reportedly featured elevated LFTs and she was directed to the ED for further evaluation. She denies any abdominal pain, nausea, or vomiting. She denies fevers, chills, or easy bruising or bleeding. She consumes alcohol only occasionally and denies history of known gallstones.  In ED, patient was found to be afebrile, saturating well on room air, and with vital signs stable. Initial lab work feature and marked elevations in alkaline phosphatase, AST, ALT, and with total bilirubin elevated to 16.6. CBC was notable for a new normocytic anemia and thrombocytosis. Patient was sent for contrast-enhanced CT of the abdomen, revealing intra- and extrahepatic biliary dilation with abrupt cut off at the site of CBD entry into the pancreas as follows:  IMPRESSION: 1. Intra and extrahepatic biliary dilatation with an abrupt cut off of the common bile duct near its entry into the pancreatic head. Findings most suspicious for a biliary tumor (cholangiocarcinoma) but a common bile duct stone and pancreatic lesion are also possible. MRCP may be helpful for further evaluation if the patient can hold her breath. Otherwise, ERCP is suggested for  both diagnostic and therapeutic purposes. 2. Moderate distention of the gallbladder without obvious gallstones. 3. No liver lesions or abdominal lymphadenopathy.  Past Medical History  Diagnosis Date  . Diverticular disease   . Arthritis   . Diverticulosis   . Hypertension   . Thyroid disease   . Mitral valve prolapse     Past Surgical History  Procedure Laterality Date  . Inguinal hernia repair      right  . Breast biopsy    . Tonsillectomy      Prior to Admission medications   Medication Sig Start Date End Date Taking? Authorizing Provider  amLODipine (NORVASC) 10 MG tablet  06/16/15  Yes Historical Provider, MD  levothyroxine (SYNTHROID, LEVOTHROID) 25 MCG tablet Take 25 mcg by mouth daily before breakfast.   Yes Historical Provider, MD  lisinopril (PRINIVIL,ZESTRIL) 5 MG tablet Take 5 mg by mouth daily.   Yes Historical Provider, MD  Multiple Vitamins-Minerals (MULTIVITAMIN GUMMIES ADULT) CHEW Chew 1 tablet by mouth daily.   Yes Historical Provider, MD    Current Facility-Administered Medications  Medication Dose Route Frequency Provider Last Rate Last Dose  . 0.9 % NaCl with KCl 40 mEq / L  infusion   Intravenous Continuous Vianne Bulls, MD 75 mL/hr at 08/06/15 2145 75 mL/hr at 08/06/15 2145  . acetaminophen (TYLENOL) tablet 650 mg  650 mg Oral Q6H PRN Vianne Bulls, MD       Or  . acetaminophen (TYLENOL) suppository 650 mg  650 mg Rectal Q6H PRN Vianne Bulls, MD      . alum & mag hydroxide-simeth (MAALOX/MYLANTA) 200-200-20 MG/5ML suspension 30 mL  30 mL Oral Q6H PRN Vianne Bulls, MD      . amLODipine (NORVASC) tablet 10 mg  10 mg Oral Daily Vianne Bulls, MD   10 mg at 08/06/15 2145  . heparin injection 5,000 Units  5,000 Units Subcutaneous 3 times per day Vianne Bulls, MD   5,000 Units at 08/06/15 2145  . HYDROcodone-acetaminophen (NORCO/VICODIN) 5-325 MG per tablet 1-2 tablet  1-2 tablet Oral Q4H PRN Ilene Qua Opyd, MD      . iohexol (OMNIPAQUE) 300 MG/ML  solution 50 mL  50 mL Oral Once PRN Deno Etienne, DO   50 mL at 08/06/15 1519  . levothyroxine (SYNTHROID, LEVOTHROID) tablet 25 mcg  25 mcg Oral QAC breakfast Ilene Qua Opyd, MD      . lisinopril (PRINIVIL,ZESTRIL) tablet 5 mg  5 mg Oral Daily Vianne Bulls, MD   5 mg at 08/06/15 2145  . multivitamin with minerals tablet 1 tablet  1 tablet Oral Daily Vianne Bulls, MD   1 tablet at 08/06/15 2145  . ondansetron (ZOFRAN) tablet 4 mg  4 mg Oral Q6H PRN Vianne Bulls, MD       Or  . ondansetron (ZOFRAN) injection 4 mg  4 mg Intravenous Q6H PRN Ilene Qua Opyd, MD      . polyethylene glycol (MIRALAX / GLYCOLAX) packet 17 g  17 g Oral Daily PRN Vianne Bulls, MD        Allergies as of 08/06/2015  . (No Known Allergies)    Family History  Problem Relation Age of Onset  . Breast cancer Sister   . Heart disease Father     Social History   Social History  . Marital Status: Married    Spouse Name: N/A  . Number of Children: 2  . Years of Education: N/A   Occupational History  . Retired    Social History Main Topics  . Smoking status: Never Smoker   . Smokeless tobacco: Never Used  . Alcohol Use: Yes     Comment: 3-4 per week   . Drug Use: No  . Sexual Activity: Not on file   Other Topics Concern  . Not on file   Social History Narrative    Review of Systems: Ten point ROS is O/W negative except as mentioned in HPI.  Physical Exam: Vital signs in last 24 hours: Temp:  [97.7 F (36.5 C)-98 F (36.7 C)] 98 F (36.7 C) (12/21 0650) Pulse Rate:  [78-92] 92 (12/21 0650) Resp:  [16-18] 18 (12/21 0650) BP: (138-161)/(64-87) 138/86 mmHg (12/21 0650) SpO2:  [95 %-100 %] 95 % (12/21 0650) Weight:  [93 lb 11.1 oz (42.5 kg)] 93 lb 11.1 oz (42.5 kg) (12/20 2018) Last BM Date: 08/07/15 General:  Alert, Well-developed, well-nourished, pleasant and cooperative in NAD; jaundice noted. Head:  Normocephalic and atraumatic. Eyes:  Scleral icterus noted. Ears:  Normal auditory  acuity. Mouth:  No deformity or lesions.   Lungs:  Clear throughout to auscultation.  No wheezes, crackles, or rhonchi.  Heart:  Regular rate and rhythm; no murmurs, clicks, rubs, or gallops. Abdomen:  Soft, non-distended.  BS present.  Non-tender. Rectal:  Deferred  Msk:  Symmetrical without gross deformities. Pulses:  Normal pulses noted. Extremities:  Without clubbing or edema. Neurologic:  Alert and  oriented x4;  grossly normal neurologically. Skin:  Intact without significant lesions or rashes. Psych:  Alert and cooperative. Normal mood and affect.  Intake/Output from previous day: 12/20 0701 -  12/21 0700 In: 618.8 [I.V.:618.8] Out: -   Lab Results:  Recent Labs  08/06/15 1428 08/06/15 1429 08/07/15 0515  WBC  --  5.0 4.5  HGB 13.6 11.0* 10.3*  HCT 40.0 31.8* 29.8*  PLT  --  411* 376   BMET  Recent Labs  08/06/15 1428 08/06/15 1429 08/07/15 0515  NA 133* 134* 136  K 4.7 3.6 4.4  CL 97* 97* 103  CO2  --  26 23  GLUCOSE 139* 142* 90  BUN 8 8 8   CREATININE 0.60 0.36* 0.38*  CALCIUM  --  9.8 9.4   LFT  Recent Labs  08/06/15 2240 08/07/15 0515  PROT  --  6.3*  ALBUMIN  --  3.5  AST  --  249*  ALT  --  324*  ALKPHOS  --  688*  BILITOT  --  15.5*  BILIDIR 9.2*  --    PT/INR  Recent Labs  08/06/15 1429  LABPROT 12.8  INR 0.95   Studies/Results: Ct Abdomen Pelvis W Contrast  08/06/2015  CLINICAL DATA:  Increasing fatigue since October. Yesterday patient becmae jaundiced. Elevated liver function studies. EXAM: CT ABDOMEN AND PELVIS WITH CONTRAST TECHNIQUE: Multidetector CT imaging of the abdomen and pelvis was performed using the standard protocol following bolus administration of intravenous contrast. CONTRAST:  128mL OMNIPAQUE IOHEXOL 300 MG/ML  SOLN COMPARISON:  CT scan 06/19/2013 FINDINGS: Lower chest: The lung bases are clear of acute process. No pleural effusion or pulmonary nodules. The heart is normal in size for age. No pericardial effusion.  The small hiatal hernia is noted. Hepatobiliary: Significant intrahepatic biliary dilatation. The common hepatic duct is also dilated and there is an abrupt cut off of the common bile duct as it enters the pancreatic head area. Findings could be due to a common bile duct stone, biliary tumor (cholangiocarcinoma) or possibly a pancreatic lesion. I believe a biliary tumor is most likely. The pancreatic duct is normal and I do not see an obvious pancreatic mass. MRI abdomen/ MRCP may be helpful for further evaluation if the patient can hold her breath adequately. Otherwise, ERCP may be helpful for both diagnostic and therapeutic purposes if the obstruction can be crossed. No focal hepatic lesions are identified. The portal and hepatic veins are patent. Pancreas: No definite pancreatic mass or inflammation. The pancreatic duct is normal in caliber and has a normal course. Spleen: Normal size.  No focal lesions. Adrenals/Urinary Tract: The adrenal glands and kidneys are normal. Stomach/Bowel: The stomach, duodenum, small bowel and colon are unremarkable. No inflammatory changes, mass lesions or obstructive findings. Diffuse colonic diverticulosis but no findings for acute diverticulitis. The terminal ileum is normal. The appendix is normal. Vascular/Lymphatic: No mesenteric or retroperitoneal mass or adenopathy. Specifically, I do not see any portal or periportal mass or adenopathy. Scattered atherosclerotic calcifications involving the aorta but fairly minimal for age. The major venous structures are patent. Reproductive: The uterus and ovaries are normal. Other: The bladder is normal. No pelvic mass, adenopathy or free pelvic fluid collections. No inguinal mass or adenopathy. Musculoskeletal: Left convex thoracolumbar scoliosis and associated degenerative lumbar spondylosis. No acute bony findings or destructive bony changes. IMPRESSION: 1. Intra and extrahepatic biliary dilatation with an abrupt cut off of the common  bile duct near its entry into the pancreatic head. Findings most suspicious for a biliary tumor (cholangiocarcinoma) but a common bile duct stone and pancreatic lesion are also possible. MRCP may be helpful for further evaluation if the patient can hold  her breath. Otherwise, ERCP is suggested for both diagnostic and therapeutic purposes. 2. Moderate distention of the gallbladder without obvious gallstones. 3. No liver lesions or abdominal lymphadenopathy. Electronically Signed   By: Marijo Sanes M.D.   On: 08/06/2015 17:34    IMPRESSION:  *81 year old female with painless jaundice/biliary obstruction:  Bili 15.5 and CT scan showing biliary dilitation with cut off of CBD suspicious for CBD tumor or pancreatic lesion.  PLAN: *Planning for EUS with ERCP with Dr. [name] tomorrow, 12/22.  Pre-op antibiotics ordered and post-op Indomethacin suppositories ordered.  The risks, benefits to these procedures were discussed with the patient and she consents to proceed.  *Ok for regular diet today then NPO after midnight.  ZEHR, JESSICA D.  08/07/2015, 9:16 AM  Pager number [phone]      Attending physician's note   I have taken a history, examined the patient and reviewed the chart. I agree with the Advanced Practitioner's note, impression and recommendations. 39 yr F with painless jaundice with dark urine and pale stool for about a month. ?pancreatic ca vs cholangio ca. Plan for EUS and +/- ERCP tomorrow   K Denzil Magnuson, MD [phone] Mon-Fri 8a-5p [phone] after 5p, weekends, holidays

## 2015-08-07 NOTE — Progress Notes (Signed)
Lab Results Per Dr. Joylene Draft : CA 19-9 is 138; CEA 3.4; LDH 267. Attempted to reach GI MD, none on call at this time.

## 2015-08-07 NOTE — Telephone Encounter (Signed)
Pt has been scheduled for EUS ERCP inpatient for 08/08/15 Alonza Bogus has been notified. Sharee Pimple at [facility] also notified

## 2015-08-08 ENCOUNTER — Inpatient Hospital Stay (HOSPITAL_COMMUNITY): Payer: Medicare Other | Admitting: Certified Registered Nurse Anesthetist

## 2015-08-08 ENCOUNTER — Inpatient Hospital Stay (HOSPITAL_COMMUNITY): Payer: Medicare Other

## 2015-08-08 ENCOUNTER — Encounter (HOSPITAL_COMMUNITY)
Admission: EM | Disposition: A | Discharge status: Home Health | Payer: Self-pay | Source: Home / Self Care | Attending: Internal Medicine

## 2015-08-08 ENCOUNTER — Encounter (HOSPITAL_COMMUNITY): Payer: Self-pay | Admitting: *Deleted

## 2015-08-08 ENCOUNTER — Ambulatory Visit (HOSPITAL_COMMUNITY): Admission: RE | Admit: 2015-08-08 | Payer: Medicare Other | Source: Ambulatory Visit | Admitting: Gastroenterology

## 2015-08-08 DIAGNOSIS — R17 Unspecified jaundice: Secondary | ICD-10-CM | POA: Insufficient documentation

## 2015-08-08 HISTORY — PX: EUS: SHX5427

## 2015-08-08 HISTORY — PX: ENDOSCOPIC RETROGRADE CHOLANGIOPANCREATOGRAPHY (ERCP) WITH PROPOFOL: SHX5810

## 2015-08-08 LAB — COMPREHENSIVE METABOLIC PANEL
ALBUMIN: 3.1 g/dL — AB (ref 3.5–5.0)
ALK PHOS: 631 U/L — AB (ref 38–126)
ALT: 282 U/L — AB (ref 14–54)
AST: 195 U/L — AB (ref 15–41)
Anion gap: 6 (ref 5–15)
BILIRUBIN TOTAL: 13.8 mg/dL — AB (ref 0.3–1.2)
BUN: 6 mg/dL (ref 6–20)
CALCIUM: 9.2 mg/dL (ref 8.9–10.3)
CO2: 24 mmol/L (ref 22–32)
CREATININE: 0.41 mg/dL — AB (ref 0.44–1.00)
Chloride: 105 mmol/L (ref 101–111)
GFR calc Af Amer: >60 mL/min (ref >60)
GLUCOSE: 106 mg/dL — AB (ref 65–99)
POTASSIUM: 4.9 mmol/L (ref 3.5–5.1)
Sodium: 135 mmol/L (ref 135–145)
Total Protein: 5.8 g/dL — ABNORMAL LOW (ref 6.5–8.1)

## 2015-08-08 LAB — HEMOGLOBIN A1C
Hgb A1c MFr Bld: 4.8 % (ref 4.8–5.6)
Mean Plasma Glucose: 91 mg/dL

## 2015-08-08 LAB — GLUCOSE, CAPILLARY: Glucose-Capillary: 92 mg/dL (ref 65–99)

## 2015-08-08 SURGERY — UPPER ENDOSCOPIC ULTRASOUND (EUS) RADIAL
Anesthesia: General

## 2015-08-08 SURGERY — ERCP, WITH INTERVENTION IF INDICATED
Anesthesia: Monitor Anesthesia Care

## 2015-08-08 MED ORDER — ROCURONIUM BROMIDE 100 MG/10ML IV SOLN
INTRAVENOUS | Status: AC
  Filled 2015-08-08: qty 1

## 2015-08-08 MED ORDER — LIDOCAINE HCL (CARDIAC) 20 MG/ML IV SOLN
INTRAVENOUS | Status: AC
  Filled 2015-08-08: qty 5

## 2015-08-08 MED ORDER — EPHEDRINE SULFATE 50 MG/ML IJ SOLN
INTRAMUSCULAR | Status: DC | PRN
  Administered 2015-08-08: 5 mg via INTRAVENOUS
  Administered 2015-08-08 (×2): 10 mg via INTRAVENOUS
  Administered 2015-08-08: 5 mg via INTRAVENOUS

## 2015-08-08 MED ORDER — SODIUM CHLORIDE 0.9 % IV SOLN
INTRAVENOUS | Status: DC | PRN
  Administered 2015-08-08: 20 mL

## 2015-08-08 MED ORDER — SODIUM CHLORIDE 0.9 % IV SOLN: INTRAVENOUS | Status: DC

## 2015-08-08 MED ORDER — LIDOCAINE HCL (CARDIAC) 20 MG/ML IV SOLN
INTRAVENOUS | Status: DC | PRN
  Administered 2015-08-08: 50 mg via INTRAVENOUS

## 2015-08-08 MED ORDER — EPHEDRINE SULFATE 50 MG/ML IJ SOLN
INTRAMUSCULAR | Status: AC
Start: 2015-08-08 — End: 2015-08-08
  Filled 2015-08-08: qty 1

## 2015-08-08 MED ORDER — FENTANYL CITRATE (PF) 100 MCG/2ML IJ SOLN
INTRAMUSCULAR | Status: AC
  Filled 2015-08-08: qty 2

## 2015-08-08 MED ORDER — SODIUM CHLORIDE 0.9 % IJ SOLN
INTRAMUSCULAR | Status: AC
Start: 2015-08-08 — End: 2015-08-08
  Filled 2015-08-08: qty 10

## 2015-08-08 MED ORDER — MIDAZOLAM HCL 2 MG/2ML IJ SOLN
INTRAMUSCULAR | Status: AC
  Filled 2015-08-08: qty 6

## 2015-08-08 MED ORDER — SUGAMMADEX SODIUM 200 MG/2ML IV SOLN
INTRAVENOUS | Status: DC | PRN
  Administered 2015-08-08: 100 mg via INTRAVENOUS

## 2015-08-08 MED ORDER — INDOMETHACIN 50 MG RE SUPP
RECTAL | Status: AC
  Filled 2015-08-08: qty 2

## 2015-08-08 MED ORDER — FENTANYL CITRATE (PF) 100 MCG/2ML IJ SOLN
INTRAMUSCULAR | Status: DC | PRN
Start: 2015-08-08 — End: 2015-08-08
  Administered 2015-08-08 (×3): 50 ug via INTRAVENOUS

## 2015-08-08 MED ORDER — INDOMETHACIN 50 MG RE SUPP
100.0000 mg | Freq: Once | RECTAL | Status: AC
  Administered 2015-08-08: 100 mg via RECTAL

## 2015-08-08 MED ORDER — ONDANSETRON HCL 4 MG/2ML IJ SOLN
INTRAMUSCULAR | Status: DC | PRN
  Administered 2015-08-08: 4 mg via INTRAVENOUS

## 2015-08-08 MED ORDER — PROPOFOL 10 MG/ML IV BOLUS
INTRAVENOUS | Status: AC
  Filled 2015-08-08: qty 40

## 2015-08-08 MED ORDER — PROPOFOL 10 MG/ML IV BOLUS
INTRAVENOUS | Status: DC | PRN
  Administered 2015-08-08: 100 mg via INTRAVENOUS
  Administered 2015-08-08: 30 mg via INTRAVENOUS
  Administered 2015-08-08: 20 mg via INTRAVENOUS
  Administered 2015-08-08 (×2): 50 mg via INTRAVENOUS
  Administered 2015-08-08: 20 mg via INTRAVENOUS
  Administered 2015-08-08: 50 mg via INTRAVENOUS

## 2015-08-08 MED ORDER — ONDANSETRON HCL 4 MG/2ML IJ SOLN
INTRAMUSCULAR | Status: AC
  Filled 2015-08-08: qty 2

## 2015-08-08 MED ORDER — LIDOCAINE HCL 1 % IJ SOLN
INTRAMUSCULAR | Status: AC
  Filled 2015-08-08: qty 20

## 2015-08-08 MED ORDER — LACTATED RINGERS IV SOLN
INTRAVENOUS | Status: DC
  Administered 2015-08-08: 1000 mL via INTRAVENOUS
  Administered 2015-08-08: 11:00:00 via INTRAVENOUS

## 2015-08-08 MED ORDER — ROCURONIUM BROMIDE 100 MG/10ML IV SOLN
INTRAVENOUS | Status: DC | PRN
Start: 2015-08-08 — End: 2015-08-08
  Administered 2015-08-08: 40 mg via INTRAVENOUS

## 2015-08-08 MED ORDER — FENTANYL CITRATE (PF) 100 MCG/2ML IJ SOLN
INTRAMUSCULAR | Status: AC
  Filled 2015-08-08: qty 4

## 2015-08-08 MED ORDER — GLUCAGON HCL RDNA (DIAGNOSTIC) 1 MG IJ SOLR
INTRAMUSCULAR | Status: AC
Start: 2015-08-08 — End: 2015-08-08
  Filled 2015-08-08: qty 1

## 2015-08-08 MED ORDER — SUGAMMADEX SODIUM 200 MG/2ML IV SOLN
INTRAVENOUS | Status: AC
  Filled 2015-08-08: qty 2

## 2015-08-08 MED ORDER — FENTANYL CITRATE (PF) 100 MCG/2ML IJ SOLN
INTRAMUSCULAR | Status: AC | PRN
  Administered 2015-08-08 (×2): 25 ug via INTRAVENOUS
  Administered 2015-08-08: 50 ug via INTRAVENOUS

## 2015-08-08 MED ORDER — SUCCINYLCHOLINE CHLORIDE 20 MG/ML IJ SOLN
INTRAMUSCULAR | Status: DC | PRN
  Administered 2015-08-08: 100 mg via INTRAVENOUS

## 2015-08-08 MED ORDER — SODIUM CHLORIDE 0.9 % IV SOLN
1.5000 g | Freq: Once | INTRAVENOUS | Status: AC
  Administered 2015-08-08: 1.5 g via INTRAVENOUS
  Filled 2015-08-08: qty 1.5

## 2015-08-08 MED ORDER — MIDAZOLAM HCL 2 MG/2ML IJ SOLN
INTRAMUSCULAR | Status: AC | PRN
  Administered 2015-08-08: 0.5 mg via INTRAVENOUS
  Administered 2015-08-08 (×2): 1 mg via INTRAVENOUS
  Administered 2015-08-08: 0.5 mg via INTRAVENOUS
  Administered 2015-08-08 (×2): 1 mg via INTRAVENOUS

## 2015-08-08 MED ORDER — PHENYLEPHRINE 40 MCG/ML (10ML) SYRINGE FOR IV PUSH (FOR BLOOD PRESSURE SUPPORT)
PREFILLED_SYRINGE | INTRAVENOUS | Status: AC
  Filled 2015-08-08: qty 10

## 2015-08-08 MED ORDER — PROPOFOL 10 MG/ML IV BOLUS
INTRAVENOUS | Status: AC
  Filled 2015-08-08: qty 20

## 2015-08-08 MED ORDER — PHENYLEPHRINE HCL 10 MG/ML IJ SOLN
INTRAMUSCULAR | Status: DC | PRN
  Administered 2015-08-08 (×2): 40 ug via INTRAVENOUS
  Administered 2015-08-08: 80 ug via INTRAVENOUS
  Administered 2015-08-08: 40 ug via INTRAVENOUS

## 2015-08-08 MED ORDER — IOHEXOL 300 MG/ML  SOLN
25.0000 mL | Freq: Once | INTRAMUSCULAR | Status: AC | PRN
  Administered 2015-08-08: 25 mL

## 2015-08-08 NOTE — Transfer of Care (Signed)
Immediate Anesthesia Transfer of Care Note  Patient: Summer Jones  Procedure(s) Performed: Procedure(s): UPPER ENDOSCOPIC ULTRASOUND (EUS) RADIAL (N/A) ENDOSCOPIC RETROGRADE CHOLANGIOPANCREATOGRAPHY (ERCP) WITH PROPOFOL (N/A)  Patient Location: PACU  Anesthesia Type:General  Level of Consciousness: Patient easily awoken, sedated, comfortable, cooperative, following commands, responds to stimulation.   Airway & Oxygen Therapy: Patient spontaneously breathing, ventilating well, oxygen via simple oxygen mask.  Post-op Assessment: Report given to PACU RN, vital signs reviewed and stable, moving all extremities.   Post vital signs: Reviewed and stable.  Complications: No apparent anesthesia complications

## 2015-08-08 NOTE — Anesthesia Preprocedure Evaluation (Addendum)
Anesthesia Evaluation  Patient identified by MRN, date of birth, ID band Patient awake    Reviewed: Allergy & Precautions, NPO status , Patient's Chart, lab work & pertinent test results  History of Anesthesia Complications Negative for: history of anesthetic complications  Airway Mallampati: III  TM Distance: <3 FB Neck ROM: Full  Mouth opening: Limited Mouth Opening  Dental  (+) Teeth Intact, Dental Advisory Given   Pulmonary neg pulmonary ROS,    Pulmonary exam normal breath sounds clear to auscultation       Cardiovascular Exercise Tolerance: Good hypertension, Pt. on medications Normal cardiovascular exam+ Valvular Problems/Murmurs MVP  Rhythm:Regular Rate:Normal     Neuro/Psych negative neurological ROS  negative psych ROS   GI/Hepatic negative GI ROS, Biliary jaundice   Endo/Other  Hypothyroidism   Renal/GU negative Renal ROS     Musculoskeletal  (+) Arthritis ,   Abdominal   Peds  Hematology  (+) Blood dyscrasia, anemia ,   Anesthesia Other Findings Day of surgery medications reviewed with the patient.  Reproductive/Obstetrics                          Anesthesia Physical Anesthesia Plan  ASA: III  Anesthesia Plan: General   Post-op Pain Management:    Induction: Intravenous  Airway Management Planned: Oral ETT  Additional Equipment:   Intra-op Plan:   Post-operative Plan: Extubation in OR  Informed Consent: I have reviewed the patients History and Physical, chart, labs and discussed the procedure including the risks, benefits and alternatives for the proposed anesthesia with the patient or authorized representative who has indicated his/her understanding and acceptance.   Dental advisory given  Plan Discussed with: CRNA  Anesthesia Plan Comments: (Risks/benefits of general anesthesia discussed with patient including risk of damage to teeth, lips, gum, and tongue,  nausea/vomiting, allergic reactions to medications, and the possibility of heart attack, stroke and death.  All patient questions answered.  Patient wishes to proceed.)        Anesthesia Quick Evaluation

## 2015-08-08 NOTE — Consult Note (Signed)
Chief Complaint: Patient was seen in consultation today for percutaneous transhepatic cholangiogram with biliary drain/possible biliary stricture biopsy Chief Complaint  Patient presents with  . Elevated Labs     Referring Physician(s): Jacobs,D  History of Present Illness: Summer Jones is a 81 y.o. female with past medical history significant for hypothyroidism, osteoarthritis, mitral valve prolapse, diverticular disease, hypertension who was admitted to the [facility] hospital on 12/20 with painless jaundice for approximately 1 week duration. In addition, the patient also had diminished appetite, malaise and weight loss. LFTs are elevated. CT scan of the abdomen and pelvis on 12/20 revealed intra-and extrahepatic biliary dilatation with an abrupt cut off of the common bile duct near its entry into the pancreatic head. There was also moderate distention of the gallbladder without gallstones. ERCP performed today revealed distorted duodenal anatomy with a 1 cm distal common bile duct stricture which could not be traversed with a wire. Request now received from GI for PTC with biliary drain and possible biopsy of the stricture.  Past Medical History  Diagnosis Date  . Diverticular disease   . Arthritis   . Diverticulosis   . Hypertension   . Thyroid disease   . Mitral valve prolapse   . Difficult intubation Anterior glottis, short TMD, limited mouth opening. Required glidescope and bougie stylet with multiple attempts.    Past Surgical History  Procedure Laterality Date  . Inguinal hernia repair      right  . Breast biopsy    . Tonsillectomy      Allergies: Review of patient's allergies indicates no known allergies.  Medications: Prior to Admission medications   Medication Sig Start Date End Date Taking? Authorizing Provider  amLODipine (NORVASC) 10 MG tablet  06/16/15  Yes Historical Provider, MD  levothyroxine (SYNTHROID, LEVOTHROID) 25 MCG tablet Take 25 mcg by mouth daily  before breakfast.   Yes Historical Provider, MD  lisinopril (PRINIVIL,ZESTRIL) 5 MG tablet Take 5 mg by mouth daily.   Yes Historical Provider, MD  Multiple Vitamins-Minerals (MULTIVITAMIN GUMMIES ADULT) CHEW Chew 1 tablet by mouth daily.   Yes Historical Provider, MD     Family History  Problem Relation Age of Onset  . Breast cancer Sister   . Heart disease Father     Social History   Social History  . Marital Status: Married    Spouse Name: N/A  . Number of Children: 2  . Years of Education: N/A   Occupational History  . Retired    Social History Main Topics  . Smoking status: Never Smoker   . Smokeless tobacco: Never Used  . Alcohol Use: Yes     Comment: 3-4 per week   . Drug Use: No  . Sexual Activity: Not Asked   Other Topics Concern  . None   Social History Narrative      Review of Systems see above  Vital Signs: BP 150/57 mmHg  Pulse 69  Temp(Src) 98.6 F (37 C) (Oral)  Resp 25  Ht 5' (1.524 m)  Wt 93 lb (42.185 kg)  BMI 18.16 kg/m2  SpO2 100%  Physical Exam patient is awake but slightly drowsy; markedly jaundiced with scleral icterus; currently in endoscopy suite. Family at bedside; chest clear to auscultation bilaterally. Heart with regular rate and rhythm. Abdomen soft, positive bowel sounds, nontender. Extremities with no edema.  Mallampati Score:  MD Evaluation Airway: WNL Heart: WNL Abdomen: WNL Chest/ Lungs: WNL ASA  Classification: 2 Mallampati/Airway Score: Two  Imaging: Ct  Abdomen Pelvis W Contrast  08/06/2015  CLINICAL DATA:  Increasing fatigue since October. Yesterday patient becmae jaundiced. Elevated liver function studies. EXAM: CT ABDOMEN AND PELVIS WITH CONTRAST TECHNIQUE: Multidetector CT imaging of the abdomen and pelvis was performed using the standard protocol following bolus administration of intravenous contrast. CONTRAST:  182m OMNIPAQUE IOHEXOL 300 MG/ML  SOLN COMPARISON:  CT scan 06/19/2013 FINDINGS: Lower chest: The  lung bases are clear of acute process. No pleural effusion or pulmonary nodules. The heart is normal in size for age. No pericardial effusion. The small hiatal hernia is noted. Hepatobiliary: Significant intrahepatic biliary dilatation. The common hepatic duct is also dilated and there is an abrupt cut off of the common bile duct as it enters the pancreatic head area. Findings could be due to a common bile duct stone, biliary tumor (cholangiocarcinoma) or possibly a pancreatic lesion. I believe a biliary tumor is most likely. The pancreatic duct is normal and I do not see an obvious pancreatic mass. MRI abdomen/ MRCP may be helpful for further evaluation if the patient can hold her breath adequately. Otherwise, ERCP may be helpful for both diagnostic and therapeutic purposes if the obstruction can be crossed. No focal hepatic lesions are identified. The portal and hepatic veins are patent. Pancreas: No definite pancreatic mass or inflammation. The pancreatic duct is normal in caliber and has a normal course. Spleen: Normal size.  No focal lesions. Adrenals/Urinary Tract: The adrenal glands and kidneys are normal. Stomach/Bowel: The stomach, duodenum, small bowel and colon are unremarkable. No inflammatory changes, mass lesions or obstructive findings. Diffuse colonic diverticulosis but no findings for acute diverticulitis. The terminal ileum is normal. The appendix is normal. Vascular/Lymphatic: No mesenteric or retroperitoneal mass or adenopathy. Specifically, I do not see any portal or periportal mass or adenopathy. Scattered atherosclerotic calcifications involving the aorta but fairly minimal for age. The major venous structures are patent. Reproductive: The uterus and ovaries are normal. Other: The bladder is normal. No pelvic mass, adenopathy or free pelvic fluid collections. No inguinal mass or adenopathy. Musculoskeletal: Left convex thoracolumbar scoliosis and associated degenerative lumbar spondylosis. No  acute bony findings or destructive bony changes. IMPRESSION: 1. Intra and extrahepatic biliary dilatation with an abrupt cut off of the common bile duct near its entry into the pancreatic head. Findings most suspicious for a biliary tumor (cholangiocarcinoma) but a common bile duct stone and pancreatic lesion are also possible. MRCP may be helpful for further evaluation if the patient can hold her breath. Otherwise, ERCP is suggested for both diagnostic and therapeutic purposes. 2. Moderate distention of the gallbladder without obvious gallstones. 3. No liver lesions or abdominal lymphadenopathy. Electronically Signed   By: PMarijo SanesM.D.   On: 08/06/2015 17:34    Labs:  CBC:  Recent Labs  08/06/15 1428 08/06/15 1429 08/07/15 0515  WBC  --  5.0 4.5  HGB 13.6 11.0* 10.3*  HCT 40.0 31.8* 29.8*  PLT  --  411* 376    COAGS:  Recent Labs  08/06/15 1429  INR 0.95  APTT 27    BMP:  Recent Labs  08/06/15 1428 08/06/15 1429 08/07/15 0515 08/08/15 0510  NA 133* 134* 136 135  K 4.7 3.6 4.4 4.9  CL 97* 97* 103 105  CO2  --  '26 23 24  '$ GLUCOSE 139* 142* 90 106*  BUN '8 8 8 6  '$ CALCIUM  --  9.8 9.4 9.2  CREATININE 0.60 0.36* 0.38* 0.41*  GFRNONAA  --  >60 >60 >60  GFRAA  --  >60 >60 >60    LIVER FUNCTION TESTS:  Recent Labs  08/06/15 1429 08/07/15 0515 08/08/15 0510  BILITOT 16.6* 15.5* 13.8*  AST 273* 249* 195*  ALT 361* 324* 282*  ALKPHOS 862* 688* 631*  PROT 7.5 6.3* 5.8*  ALBUMIN 4.2 3.5 3.1*    TUMOR MARKERS: No results for input(s): AFPTM, CEA, CA199, CHROMGRNA in the last 8760 hours.  Assessment and Plan: Patient with history of painless jaundice, elevated LFTs, weight loss, anorexia, and imaging findings of intra-and extra hepatic biliary dilatation with abrupt cut off of the common bile duct near its entry into the pancreatic head suspicious for cholangiocarcinoma. ERCP on 12/22 revealed distorted duodenal anatomy with distal common bile duct stricture; a  wire could not be passed beyond stricture. Request now received for PTC with biliary drainage/possible biliary stricture biopsy. Imaging studies have been reviewed by Dr. Laurence Ferrari. Current laboratory values reveal WBC 4.5, hemoglobin 10.3, platelets 376K, creatinine 0.41, PT 12.8, INR 0.95, total bilirubin 13.8. Details/risks of above procedure, including but not limited to, internal bleeding, infection/sepsis, inability to place drain or prolonged need for biliary drainage discussed with patient's family with their understanding and consent. Procedure scheduled for later today.   Thank you for this interesting consult.  I greatly enjoyed meeting AZKA STEGER and look forward to participating in their care.  A copy of this report was sent to the requesting provider on this date.  Signed: D. Rowe Robert 08/08/2015, 2:09 PM   I spent a total of   20 minutes in face to face in clinical consultation, greater than 50% of which was counseling/coordinating care for PTC with biliary drain, possible biliary stricture biopsy

## 2015-08-08 NOTE — Interval H&P Note (Signed)
History and Physical Interval Note:  08/08/2015 10:11 AM  Summer Jones  has presented today for surgery, with the diagnosis of painless jaundice  The various methods of treatment have been discussed with the patient and family. After consideration of risks, benefits and other options for treatment, the patient has consented to  Procedure(s): UPPER ENDOSCOPIC ULTRASOUND (EUS) RADIAL (N/A) ENDOSCOPIC RETROGRADE CHOLANGIOPANCREATOGRAPHY (ERCP) WITH PROPOFOL (N/A) as a surgical intervention .  The patient's history has been reviewed, patient examined, no change in status, stable for surgery.  I have reviewed the patient's chart and labs.  Questions were answered to the patient's satisfaction.     [name]

## 2015-08-08 NOTE — Sedation Documentation (Signed)
Unable to return or waste drugs in pyxis.  Versed 1mg  and Fentanyl 190mcg wasted in sink and witnessed by [facility], RTR.

## 2015-08-08 NOTE — Progress Notes (Signed)
[facility] PROGRESS NOTE  Summer Jones [id number] DOB: 07/03/1930 DOA: 08/06/2015 PCP: [name], MD  HPI/Brief narrative 81 y.o. female with PMH of hypothyroidism, osteoarthritis, and mitral valve prolapse who presents to the ED at the direction of her primary care physician, Dr. Joylene Draft, for evaluation of painless jaundice.  Assessment/Plan: 1. Painless jaundice, biliary obstruction  - CT abdomen demonstrates intra- and extrahepatic biliary dilatation with abrupt cut off with her CBD enters head of pancreas with concerns for cancer - [facility] was consulted from the emergency department. Discuss with gastroenterology. -Patient for EUS and possible ERCP on 08/08/2015  2. Normocytic anemia  - Mild, normocytic, and not noted on prior labs - We will monitor, need for transfusion is not anticipated  3. Hypokalemia  - Continue to replace as needed -Normalized  4. Hyperglycemia - Patient is not a known diabetic -Likely transient - A1c 4.8  5. Hypertension  - There is a modest elevation in blood pressure since admission - Will continue home dose lisinopril and Norvasc - Continue to monitor, adjust regimen as needed  6. Hypothyroidism  - Continue home dose Synthroid, with monitoring  7. Weight loss, underweight - BMI less than 18.5, patient complains of loss of appetite and recent 5-10 pound weight loss -Nutrition was consulted  Code Status: Full Family Communication: Pt in room Disposition Plan: Home when cleared by GI   Consultants:  GI  Procedures:    Antibiotics: Anti-infectives    Start     Dose/Rate Route Frequency Ordered Stop   08/08/15 0915  ampicillin-sulbactam (UNASYN) 1.5 g in sodium chloride 0.9 % 50 mL IVPB     1.5 g 100 mL/hr over 30 Minutes Intravenous  Once 08/08/15 0910        HPI/Subjective: Patient in good spirits this morning. Denies abdominal pain  Objective: Filed Vitals:   08/07/15 1402 08/07/15 2133 08/08/15  0609 08/08/15 0909  BP: 128/74 131/67 140/74 149/72  Pulse: 73 67 61 67  Temp: 98.2 F (36.8 C) 98.1 F (36.7 C) 98.3 F (36.8 C) 98.2 F (36.8 C)  TempSrc: Oral Oral Oral Oral  Resp: 17 16 16 20   Height:    5' (1.524 m)  Weight:    42.185 kg (93 lb)  SpO2: 98% 99% 98% 100%    Intake/Output Summary (Last 24 hours) at 08/08/15 1242 Last data filed at 08/08/15 1103  Gross per 24 hour  Intake   2800 ml  Output      0 ml  Net   2800 ml   Filed Weights   08/06/15 2018 08/08/15 0909  Weight: 42.5 kg (93 lb 11.1 oz) 42.185 kg (93 lb)    Exam:   General:  Awake, in nad ambulating in room  Cardiovascular: regular, s1, s2  Respiratory: normal resp effort, no wheezing  Abdomen: soft,nondistended, nontender  Musculoskeletal: perfused, no clubbing   Data Reviewed: Basic Metabolic Panel:  Recent Labs Lab 08/06/15 1428 08/06/15 1429 08/07/15 0515 08/08/15 0510  NA 133* 134* 136 135  K 4.7 3.6 4.4 4.9  CL 97* 97* 103 105  CO2  --  26 23 24   GLUCOSE 139* 142* 90 106*  BUN 8 8 8 6   CREATININE 0.60 0.36* 0.38* 0.41*  CALCIUM  --  9.8 9.4 9.2   Liver Function Tests:  Recent Labs Lab 08/06/15 1429 08/07/15 0515 08/08/15 0510  AST 273* 249* 195*  ALT 361* 324* 282*  ALKPHOS 862* 688* 631*  BILITOT 16.6* 15.5* 13.8*  PROT  7.5 6.3* 5.8*  ALBUMIN 4.2 3.5 3.1*    Recent Labs Lab 08/06/15 1429  LIPASE 55*   No results for input(s): AMMONIA in the last 168 hours. CBC:  Recent Labs Lab 08/06/15 1428 08/06/15 1429 08/07/15 0515  WBC  --  5.0 4.5  NEUTROABS  --  3.8  --   HGB 13.6 11.0* 10.3*  HCT 40.0 31.8* 29.8*  MCV  --  93.8 94.3  PLT  --  411* 376   Cardiac Enzymes: No results for input(s): CKTOTAL, CKMB, CKMBINDEX, TROPONINI in the last 168 hours. BNP (last 3 results) No results for input(s): BNP in the last 8760 hours.  ProBNP (last 3 results) No results for input(s): PROBNP in the last 8760 hours.  CBG:  Recent Labs Lab 08/07/15 0738  08/08/15 0746  GLUCAP 89 92    No results found for this or any previous visit (from the past 240 hour(s)).   Studies: Ct Abdomen Pelvis W Contrast  08/06/2015  CLINICAL DATA:  Increasing fatigue since October. Yesterday patient becmae jaundiced. Elevated liver function studies. EXAM: CT ABDOMEN AND PELVIS WITH CONTRAST TECHNIQUE: Multidetector CT imaging of the abdomen and pelvis was performed using the standard protocol following bolus administration of intravenous contrast. CONTRAST:  157mL OMNIPAQUE IOHEXOL 300 MG/ML  SOLN COMPARISON:  CT scan 06/19/2013 FINDINGS: Lower chest: The lung bases are clear of acute process. No pleural effusion or pulmonary nodules. The heart is normal in size for age. No pericardial effusion. The small hiatal hernia is noted. Hepatobiliary: Significant intrahepatic biliary dilatation. The common hepatic duct is also dilated and there is an abrupt cut off of the common bile duct as it enters the pancreatic head area. Findings could be due to a common bile duct stone, biliary tumor (cholangiocarcinoma) or possibly a pancreatic lesion. I believe a biliary tumor is most likely. The pancreatic duct is normal and I do not see an obvious pancreatic mass. MRI abdomen/ MRCP may be helpful for further evaluation if the patient can hold her breath adequately. Otherwise, ERCP may be helpful for both diagnostic and therapeutic purposes if the obstruction can be crossed. No focal hepatic lesions are identified. The portal and hepatic veins are patent. Pancreas: No definite pancreatic mass or inflammation. The pancreatic duct is normal in caliber and has a normal course. Spleen: Normal size.  No focal lesions. Adrenals/Urinary Tract: The adrenal glands and kidneys are normal. Stomach/Bowel: The stomach, duodenum, small bowel and colon are unremarkable. No inflammatory changes, mass lesions or obstructive findings. Diffuse colonic diverticulosis but no findings for acute diverticulitis.  The terminal ileum is normal. The appendix is normal. Vascular/Lymphatic: No mesenteric or retroperitoneal mass or adenopathy. Specifically, I do not see any portal or periportal mass or adenopathy. Scattered atherosclerotic calcifications involving the aorta but fairly minimal for age. The major venous structures are patent. Reproductive: The uterus and ovaries are normal. Other: The bladder is normal. No pelvic mass, adenopathy or free pelvic fluid collections. No inguinal mass or adenopathy. Musculoskeletal: Left convex thoracolumbar scoliosis and associated degenerative lumbar spondylosis. No acute bony findings or destructive bony changes. IMPRESSION: 1. Intra and extrahepatic biliary dilatation with an abrupt cut off of the common bile duct near its entry into the pancreatic head. Findings most suspicious for a biliary tumor (cholangiocarcinoma) but a common bile duct stone and pancreatic lesion are also possible. MRCP may be helpful for further evaluation if the patient can hold her breath. Otherwise, ERCP is suggested for both diagnostic and therapeutic  purposes. 2. Moderate distention of the gallbladder without obvious gallstones. 3. No liver lesions or abdominal lymphadenopathy. Electronically Signed   By: Marijo Sanes M.D.   On: 08/06/2015 17:34    Scheduled Meds: . [MAR Hold] amLODipine  10 mg Oral Daily  . ampicillin-sulbactam (UNASYN) 1.5 g IVPB  1.5 g Intravenous Once  . [MAR Hold] heparin  5,000 Units Subcutaneous 3 times per day  . indomethacin  100 mg Rectal Once  . [MAR Hold] levothyroxine  25 mcg Oral QAC breakfast  . [MAR Hold] lisinopril  5 mg Oral Daily  . [MAR Hold] multivitamin with minerals  1 tablet Oral Daily   Continuous Infusions: . sodium chloride    . sodium chloride    . 0.9 % NaCl with KCl 40 mEq / L Stopped (08/08/15 0859)  . lactated ringers 1,000 mL (08/08/15 0913)    Principal Problem:   Biliary obstruction Active Problems:   Liver failure, acute    Anemia   Hypokalemia   Hyperglycemia   Common biliary duct obstruction   Seven [place] K  [facility] Pager [phone]. If 7PM-7AM, please contact night-coverage at [email or web address], password [email or web address] 08/08/2015, 12:42 PM  LOS: 2 days

## 2015-08-08 NOTE — H&P (View-Only) (Signed)
Referring Provider: EDP Primary Care Physician:  [name], MD Primary Gastroenterologist:  Dr. Olevia Perches  Reason for Consultation:  Painless jaundice  HPI: Summer Jones is a 81 y.o. female with PMH of hypothyroidism, osteoarthritis, and mitral valve prolapse who presents to the ED at the direction of her primary care physician, Dr. Joylene Draft, for evaluation of painless jaundice. Mr. Dimos notes that she had been in her usual state of good health until 2-4 weeks ago when her appetite dropped off and she experienced some general malaise. She notes a 5-10 pound weight loss over this interval, which she attributes to her poor appetite. It was not until yesterday that the patient realized she had become jaundiced and saw her PCP for evaluation. She was sent for lab work that reportedly featured elevated LFTs and she was directed to the ED for further evaluation. She denies any abdominal pain, nausea, or vomiting. She denies fevers, chills, or easy bruising or bleeding. She consumes alcohol only occasionally and denies history of known gallstones.  In ED, patient was found to be afebrile, saturating well on room air, and with vital signs stable. Initial lab work feature and marked elevations in alkaline phosphatase, AST, ALT, and with total bilirubin elevated to 16.6. CBC was notable for a new normocytic anemia and thrombocytosis. Patient was sent for contrast-enhanced CT of the abdomen, revealing intra- and extrahepatic biliary dilation with abrupt cut off at the site of CBD entry into the pancreas as follows:  IMPRESSION: 1. Intra and extrahepatic biliary dilatation with an abrupt cut off of the common bile duct near its entry into the pancreatic head. Findings most suspicious for a biliary tumor (cholangiocarcinoma) but a common bile duct stone and pancreatic lesion are also possible. MRCP may be helpful for further evaluation if the patient can hold her breath. Otherwise, ERCP is suggested for  both diagnostic and therapeutic purposes. 2. Moderate distention of the gallbladder without obvious gallstones. 3. No liver lesions or abdominal lymphadenopathy.  Past Medical History  Diagnosis Date  . Diverticular disease   . Arthritis   . Diverticulosis   . Hypertension   . Thyroid disease   . Mitral valve prolapse     Past Surgical History  Procedure Laterality Date  . Inguinal hernia repair      right  . Breast biopsy    . Tonsillectomy      Prior to Admission medications   Medication Sig Start Date End Date Taking? Authorizing Provider  amLODipine (NORVASC) 10 MG tablet  06/16/15  Yes Historical Provider, MD  levothyroxine (SYNTHROID, LEVOTHROID) 25 MCG tablet Take 25 mcg by mouth daily before breakfast.   Yes Historical Provider, MD  lisinopril (PRINIVIL,ZESTRIL) 5 MG tablet Take 5 mg by mouth daily.   Yes Historical Provider, MD  Multiple Vitamins-Minerals (MULTIVITAMIN GUMMIES ADULT) CHEW Chew 1 tablet by mouth daily.   Yes Historical Provider, MD    Current Facility-Administered Medications  Medication Dose Route Frequency Provider Last Rate Last Dose  . 0.9 % NaCl with KCl 40 mEq / L  infusion   Intravenous Continuous Vianne Bulls, MD 75 mL/hr at 08/06/15 2145 75 mL/hr at 08/06/15 2145  . acetaminophen (TYLENOL) tablet 650 mg  650 mg Oral Q6H PRN Vianne Bulls, MD       Or  . acetaminophen (TYLENOL) suppository 650 mg  650 mg Rectal Q6H PRN Vianne Bulls, MD      . alum & mag hydroxide-simeth (MAALOX/MYLANTA) 200-200-20 MG/5ML suspension 30 mL  30 mL Oral Q6H PRN Vianne Bulls, MD      . amLODipine (NORVASC) tablet 10 mg  10 mg Oral Daily Vianne Bulls, MD   10 mg at 08/06/15 2145  . heparin injection 5,000 Units  5,000 Units Subcutaneous 3 times per day Vianne Bulls, MD   5,000 Units at 08/06/15 2145  . HYDROcodone-acetaminophen (NORCO/VICODIN) 5-325 MG per tablet 1-2 tablet  1-2 tablet Oral Q4H PRN Ilene Qua Opyd, MD      . iohexol (OMNIPAQUE) 300 MG/ML  solution 50 mL  50 mL Oral Once PRN Deno Etienne, DO   50 mL at 08/06/15 1519  . levothyroxine (SYNTHROID, LEVOTHROID) tablet 25 mcg  25 mcg Oral QAC breakfast Ilene Qua Opyd, MD      . lisinopril (PRINIVIL,ZESTRIL) tablet 5 mg  5 mg Oral Daily Vianne Bulls, MD   5 mg at 08/06/15 2145  . multivitamin with minerals tablet 1 tablet  1 tablet Oral Daily Vianne Bulls, MD   1 tablet at 08/06/15 2145  . ondansetron (ZOFRAN) tablet 4 mg  4 mg Oral Q6H PRN Vianne Bulls, MD       Or  . ondansetron (ZOFRAN) injection 4 mg  4 mg Intravenous Q6H PRN Ilene Qua Opyd, MD      . polyethylene glycol (MIRALAX / GLYCOLAX) packet 17 g  17 g Oral Daily PRN Vianne Bulls, MD        Allergies as of 08/06/2015  . (No Known Allergies)    Family History  Problem Relation Age of Onset  . Breast cancer Sister   . Heart disease Father     Social History   Social History  . Marital Status: Married    Spouse Name: N/A  . Number of Children: 2  . Years of Education: N/A   Occupational History  . Retired    Social History Main Topics  . Smoking status: Never Smoker   . Smokeless tobacco: Never Used  . Alcohol Use: Yes     Comment: 3-4 per week   . Drug Use: No  . Sexual Activity: Not on file   Other Topics Concern  . Not on file   Social History Narrative    Review of Systems: Ten point ROS is O/W negative except as mentioned in HPI.  Physical Exam: Vital signs in last 24 hours: Temp:  [97.7 F (36.5 C)-98 F (36.7 C)] 98 F (36.7 C) (12/21 0650) Pulse Rate:  [78-92] 92 (12/21 0650) Resp:  [16-18] 18 (12/21 0650) BP: (138-161)/(64-87) 138/86 mmHg (12/21 0650) SpO2:  [95 %-100 %] 95 % (12/21 0650) Weight:  [93 lb 11.1 oz (42.5 kg)] 93 lb 11.1 oz (42.5 kg) (12/20 2018) Last BM Date: 08/07/15 General:  Alert, Well-developed, well-nourished, pleasant and cooperative in NAD; jaundice noted. Head:  Normocephalic and atraumatic. Eyes:  Scleral icterus noted. Ears:  Normal auditory  acuity. Mouth:  No deformity or lesions.   Lungs:  Clear throughout to auscultation.  No wheezes, crackles, or rhonchi.  Heart:  Regular rate and rhythm; no murmurs, clicks, rubs, or gallops. Abdomen:  Soft, non-distended.  BS present.  Non-tender. Rectal:  Deferred  Msk:  Symmetrical without gross deformities. Pulses:  Normal pulses noted. Extremities:  Without clubbing or edema. Neurologic:  Alert and  oriented x4;  grossly normal neurologically. Skin:  Intact without significant lesions or rashes. Psych:  Alert and cooperative. Normal mood and affect.  Intake/Output from previous day: 12/20 0701 -  12/21 0700 In: 618.8 [I.V.:618.8] Out: -   Lab Results:  Recent Labs  08/06/15 1428 08/06/15 1429 08/07/15 0515  WBC  --  5.0 4.5  HGB 13.6 11.0* 10.3*  HCT 40.0 31.8* 29.8*  PLT  --  411* 376   BMET  Recent Labs  08/06/15 1428 08/06/15 1429 08/07/15 0515  NA 133* 134* 136  K 4.7 3.6 4.4  CL 97* 97* 103  CO2  --  26 23  GLUCOSE 139* 142* 90  BUN 8 8 8   CREATININE 0.60 0.36* 0.38*  CALCIUM  --  9.8 9.4   LFT  Recent Labs  08/06/15 2240 08/07/15 0515  PROT  --  6.3*  ALBUMIN  --  3.5  AST  --  249*  ALT  --  324*  ALKPHOS  --  688*  BILITOT  --  15.5*  BILIDIR 9.2*  --    PT/INR  Recent Labs  08/06/15 1429  LABPROT 12.8  INR 0.95   Studies/Results: Ct Abdomen Pelvis W Contrast  08/06/2015  CLINICAL DATA:  Increasing fatigue since October. Yesterday patient becmae jaundiced. Elevated liver function studies. EXAM: CT ABDOMEN AND PELVIS WITH CONTRAST TECHNIQUE: Multidetector CT imaging of the abdomen and pelvis was performed using the standard protocol following bolus administration of intravenous contrast. CONTRAST:  131mL OMNIPAQUE IOHEXOL 300 MG/ML  SOLN COMPARISON:  CT scan 06/19/2013 FINDINGS: Lower chest: The lung bases are clear of acute process. No pleural effusion or pulmonary nodules. The heart is normal in size for age. No pericardial effusion.  The small hiatal hernia is noted. Hepatobiliary: Significant intrahepatic biliary dilatation. The common hepatic duct is also dilated and there is an abrupt cut off of the common bile duct as it enters the pancreatic head area. Findings could be due to a common bile duct stone, biliary tumor (cholangiocarcinoma) or possibly a pancreatic lesion. I believe a biliary tumor is most likely. The pancreatic duct is normal and I do not see an obvious pancreatic mass. MRI abdomen/ MRCP may be helpful for further evaluation if the patient can hold her breath adequately. Otherwise, ERCP may be helpful for both diagnostic and therapeutic purposes if the obstruction can be crossed. No focal hepatic lesions are identified. The portal and hepatic veins are patent. Pancreas: No definite pancreatic mass or inflammation. The pancreatic duct is normal in caliber and has a normal course. Spleen: Normal size.  No focal lesions. Adrenals/Urinary Tract: The adrenal glands and kidneys are normal. Stomach/Bowel: The stomach, duodenum, small bowel and colon are unremarkable. No inflammatory changes, mass lesions or obstructive findings. Diffuse colonic diverticulosis but no findings for acute diverticulitis. The terminal ileum is normal. The appendix is normal. Vascular/Lymphatic: No mesenteric or retroperitoneal mass or adenopathy. Specifically, I do not see any portal or periportal mass or adenopathy. Scattered atherosclerotic calcifications involving the aorta but fairly minimal for age. The major venous structures are patent. Reproductive: The uterus and ovaries are normal. Other: The bladder is normal. No pelvic mass, adenopathy or free pelvic fluid collections. No inguinal mass or adenopathy. Musculoskeletal: Left convex thoracolumbar scoliosis and associated degenerative lumbar spondylosis. No acute bony findings or destructive bony changes. IMPRESSION: 1. Intra and extrahepatic biliary dilatation with an abrupt cut off of the common  bile duct near its entry into the pancreatic head. Findings most suspicious for a biliary tumor (cholangiocarcinoma) but a common bile duct stone and pancreatic lesion are also possible. MRCP may be helpful for further evaluation if the patient can hold  her breath. Otherwise, ERCP is suggested for both diagnostic and therapeutic purposes. 2. Moderate distention of the gallbladder without obvious gallstones. 3. No liver lesions or abdominal lymphadenopathy. Electronically Signed   By: Marijo Sanes M.D.   On: 08/06/2015 17:34    IMPRESSION:  *81 year old female with painless jaundice/biliary obstruction:  Bili 15.5 and CT scan showing biliary dilitation with cut off of CBD suspicious for CBD tumor or pancreatic lesion.  PLAN: *Planning for EUS with ERCP with Dr. [name] tomorrow, 12/22.  Pre-op antibiotics ordered and post-op Indomethacin suppositories ordered.  The risks, benefits to these procedures were discussed with the patient and she consents to proceed.  *Ok for regular diet today then NPO after midnight.  ZEHR, JESSICA D.  08/07/2015, 9:16 AM  Pager number [phone]      Attending physician's note   I have taken a history, examined the patient and reviewed the chart. I agree with the Advanced Practitioner's note, impression and recommendations. 31 yr F with painless jaundice with dark urine and pale stool for about a month. ?pancreatic ca vs cholangio ca. Plan for EUS and +/- ERCP tomorrow   K Denzil Magnuson, MD [phone] Mon-Fri 8a-5p [phone] after 5p, weekends, holidays

## 2015-08-08 NOTE — Procedures (Signed)
Interventional Radiology Procedure Note  Procedure:  1.) PTC shows CBD occlusion 2.) Alligator forceps biopsy 3.) Placement int/ext 81F biliary drain  Complications: None  Estimated Blood Loss: 0 mL  Recommendations: - Bedrest overnight - Path pending - Drain to gravity until Br down then may cap  Signed,  Criselda Peaches, MD

## 2015-08-08 NOTE — Anesthesia Postprocedure Evaluation (Signed)
Anesthesia Post Note  Patient: Summer Jones  Procedure(s) Performed: Procedure(s) (LRB): UPPER ENDOSCOPIC ULTRASOUND (EUS) RADIAL (N/A) ENDOSCOPIC RETROGRADE CHOLANGIOPANCREATOGRAPHY (ERCP) WITH PROPOFOL (N/A)  Patient location during evaluation: PACU Anesthesia Type: General Level of consciousness: awake and alert Pain management: pain level controlled Vital Signs Assessment: post-procedure vital signs reviewed and stable Respiratory status: spontaneous breathing, nonlabored ventilation, respiratory function stable and patient connected to nasal cannula oxygen Cardiovascular status: blood pressure returned to baseline and stable Postop Assessment: no signs of nausea or vomiting Anesthetic complications: yes Anesthetic complication details: anesthesia complicationsComments: Difficult airway.  Required glidescope plus bougie stylet for anterior, immobile glottis.      Last Vitals:  Filed Vitals:   08/08/15 1350 08/08/15 1400  BP: 173/62 145/62  Pulse: 67 66  Temp:    Resp: 24 20    Last Pain:  Filed Vitals:   08/08/15 1425  PainSc: 0-No pain                 Catalina Gravel

## 2015-08-08 NOTE — Op Note (Signed)
[facility] [place], [id number]   ERCP PROCEDURE REPORT  PATIENT: Summer Jones, Summer Jones  MR#: [id number] BIRTHDATE: Jan 01, 1930  GENDER: female  ENDOSCOPIST: [name], MD REFERRED BY: Denzil Magnuson, MD  PROCEDURE DATE:  08/08/2015 PROCEDURE:   ERCP, diagnostic INDICATIONS:painless jaundice, dilated biliary tree.  MEDICATIONS: Per Anesthesia and indomethacin '100mg'$  PR, unasyn 1.5gm IV TOPICAL ANESTHETIC:   none  DESCRIPTION OF PROCEDURE:     Physical exam was performed.  Informed consent was obtained from the patient after explaining the benefits, risks, and alternatives to the procedure.  The patient was connected to the monitor and placed in the semi-prone position. IV medicine was administered through an indwelling cannula and oxygen via endotracheal tube.  After administration of sedation, the patients esophagus was intubated and the duodenoscope [id number] endoscope was advanced under direct visualization to the second portion of the duodenum with some difficulty (she has a very small mouth and seemingly large tongue, see EUS report just prior to this case.  The anatomy of the duodenum was quite distorted and so ERCP positioning was less than ideal (long position and also leftward). A 44 Autotome over a .035 hydrawire was initially used.  Main pancreatic duct was cannulated several times.  Main pancreatic duct gently injected with dye once. I was able to intubate the distal CBD with wire and this position was confirmed with incomplete cholangiogram.  This cholangiogram showed a tight, 1cm long distal CBD stricture.  Despite multiple tries I was not able to advance the wire more deeply into the bile duct past the stricture.  I was similarly unsucssesful with a .025 hydrawire.  During my last injection of dye, with catheter tip in the distal CBD, it appeared that contrast extravasated into retroperitineal space without obvious leak site (presumed  leak to be from distal CBD).  The scope was then completely withdrawn from the patient and the procedure terminated.     COMPLICATIONS:    None  ENDOSCOPIC IMPRESSION: Distorted duodenal anatomy. Tight, 1cm distal CBD stricture that was not able to be passed with wire. Extravasation of contrast dye without obvious leak site (presumed leak to be from distal CBD)  RECOMMENDATIONS: PTC for biliary decompression and stricture brushing.  In patient GI team aware.   _______________________________ eSigned:  [name], MD 08/08/2015 1:23 PM

## 2015-08-08 NOTE — Anesthesia Procedure Notes (Signed)
Procedure Name: Intubation Date/Time: 08/08/2015 10:46 AM Performed by: Deliah Boston Pre-anesthesia Checklist: Patient identified, Emergency Drugs available, Suction available and Patient being monitored Patient Re-evaluated:Patient Re-evaluated prior to inductionOxygen Delivery Method: Circle System Utilized Preoxygenation: Pre-oxygenation with 100% oxygen Intubation Type: IV induction Ventilation: Mask ventilation without difficulty Laryngoscope Size: Glidescope and 3 Grade View: Grade I Tube type: Oral Tube size: 7.0 mm Number of attempts: 3 Airway Equipment and Method: Stylet,  Oral airway and Video-laryngoscopy Placement Confirmation: ETT inserted through vocal cords under direct vision,  positive ETCO2 and breath sounds checked- equal and bilateral Secured at: 22 cm Tube secured with: Tape Dental Injury: Teeth and Oropharynx as per pre-operative assessment  Difficulty Due To: Difficulty was anticipated, Difficult Airway- due to anterior larynx, Difficult Airway- due to limited oral opening and Difficult Airway- due to large tongue

## 2015-08-09 ENCOUNTER — Encounter (HOSPITAL_COMMUNITY): Payer: Self-pay | Admitting: Gastroenterology

## 2015-08-09 ENCOUNTER — Inpatient Hospital Stay (HOSPITAL_COMMUNITY): Payer: Medicare Other

## 2015-08-09 ENCOUNTER — Other Ambulatory Visit: Payer: Self-pay | Admitting: *Deleted

## 2015-08-09 DIAGNOSIS — K7200 Acute and subacute hepatic failure without coma: Secondary | ICD-10-CM

## 2015-08-09 DIAGNOSIS — R17 Unspecified jaundice: Secondary | ICD-10-CM

## 2015-08-09 DIAGNOSIS — K831 Obstruction of bile duct: Secondary | ICD-10-CM

## 2015-08-09 LAB — COMPREHENSIVE METABOLIC PANEL
ALK PHOS: 633 U/L — AB (ref 38–126)
ALT: 264 U/L — ABNORMAL HIGH (ref 14–54)
ALT: 256 U/L — AB (ref 14–54)
ANION GAP: 9 (ref 5–15)
AST: 166 U/L — AB (ref 15–41)
AST: 140 U/L — AB (ref 15–41)
Albumin: 3.4 g/dL — ABNORMAL LOW (ref 3.5–5.0)
Albumin: 3.6 g/dL (ref 3.5–5.0)
Alkaline Phosphatase: 624 U/L — ABNORMAL HIGH (ref 38–126)
Anion gap: 8 (ref 5–15)
BILIRUBIN TOTAL: 11.5 mg/dL — AB (ref 0.3–1.2)
BILIRUBIN TOTAL: 11 mg/dL — AB (ref 0.3–1.2)
BUN: 8 mg/dL (ref 6–20)
BUN: 8 mg/dL (ref 6–20)
CALCIUM: 9.2 mg/dL (ref 8.9–10.3)
CHLORIDE: 100 mmol/L — AB (ref 101–111)
CO2: 27 mmol/L (ref 22–32)
CO2: 28 mmol/L (ref 22–32)
CREATININE: 0.43 mg/dL — AB (ref 0.44–1.00)
Calcium: 9.4 mg/dL (ref 8.9–10.3)
Chloride: 101 mmol/L (ref 101–111)
Creatinine, Ser: 0.54 mg/dL (ref 0.44–1.00)
GFR calc Af Amer: >60 mL/min (ref >60)
GLUCOSE: 124 mg/dL — AB (ref 65–99)
Glucose, Bld: 115 mg/dL — ABNORMAL HIGH (ref 65–99)
POTASSIUM: 4.8 mmol/L (ref 3.5–5.1)
Potassium: 4.4 mmol/L (ref 3.5–5.1)
Sodium: 137 mmol/L (ref 135–145)
Sodium: 136 mmol/L (ref 135–145)
TOTAL PROTEIN: 5.9 g/dL — AB (ref 6.5–8.1)
TOTAL PROTEIN: 6.7 g/dL (ref 6.5–8.1)

## 2015-08-09 LAB — CBC
HEMATOCRIT: 29.5 % — AB (ref 36.0–46.0)
Hemoglobin: 10.2 g/dL — ABNORMAL LOW (ref 12.0–15.0)
MCH: 32.8 pg (ref 26.0–34.0)
MCHC: 34.6 g/dL (ref 30.0–36.0)
MCV: 94.9 fL (ref 78.0–100.0)
Platelets: 351 10*3/uL (ref 150–400)
RBC: 3.11 MIL/uL — ABNORMAL LOW (ref 3.87–5.11)
RDW: 16.2 % — AB (ref 11.5–15.5)
WBC: 11.6 10*3/uL — AB (ref 4.0–10.5)

## 2015-08-09 LAB — GLUCOSE, CAPILLARY: GLUCOSE-CAPILLARY: 91 mg/dL (ref 65–99)

## 2015-08-09 MED ORDER — GLYCERIN (LAXATIVE) 2.1 G RE SUPP
1.0000 | Freq: Once | RECTAL | Status: AC
  Administered 2015-08-09: 1 via RECTAL
  Filled 2015-08-09: qty 1

## 2015-08-09 MED ORDER — HYDROMORPHONE HCL 1 MG/ML IJ SOLN: 0.5000 mg | INTRAMUSCULAR | Status: DC | PRN

## 2015-08-09 MED ORDER — PEG 3350-KCL-NA BICARB-NACL 420 G PO SOLR: 4000.0000 mL | Freq: Once | ORAL | Status: DC

## 2015-08-09 MED ORDER — IOHEXOL 300 MG/ML  SOLN
100.0000 mL | Freq: Once | INTRAMUSCULAR | Status: AC | PRN
  Administered 2015-08-09: 100 mL via INTRAVENOUS

## 2015-08-09 MED ORDER — HYDROMORPHONE HCL 1 MG/ML IJ SOLN: 1.0000 mg | INTRAMUSCULAR | Status: DC | PRN

## 2015-08-09 MED ORDER — OXYCODONE HCL 5 MG PO TABS: 5.0000 mg | ORAL_TABLET | ORAL | Status: DC | PRN

## 2015-08-09 NOTE — Discharge Summary (Addendum)
Physician Discharge Summary  Summer Jones ERX:[id number] DOB: 07/30/1930 DOA: 08/06/2015  PCP: [name], MD  Admit date: 08/06/2015 Discharge date: 08/10/2015  Time spent: 20 minutes  Recommendations for Outpatient Follow-up:  1. Follow up with PCP in 2-3 weeks 2. Follow up with GI as scheduled 3. Follow up with IR in 4 weeks for placement of an internal wall flex stent  4. Recommend repeat Comprehensive Metabolic Profile within 1 week  Discharge Diagnoses:  Principal Problem:   Biliary obstruction Active Problems:   Liver failure, acute   Anemia   Hypokalemia   Hyperglycemia   Common biliary duct obstruction   Jaundice   Discharge Condition: Stable  Diet recommendation: Regular  Filed Weights   08/06/15 2018 08/08/15 0909  Weight: 42.5 kg (93 lb 11.1 oz) 42.185 kg (93 lb)    History of present illness:  Please review dictated H and P from 12/20 for details. Briefly, 81 y.o. female with PMH of hypothyroidism, osteoarthritis, and mitral valve prolapse who presents to the ED at the direction of her primary care physician, Dr. Joylene Draft, for evaluation of painless jaundice  Hospital Course:  1. Painless jaundice, biliary obstruction  - CT abdomen demonstrates intra- and extrahepatic biliary dilatation with abrupt cut off with her CBD enters head of pancreas with concerns for cancer - [facility] was consulted from the emergency department. -Patient underwent EUS and ERCP on 08/08/2015 demonstrating a tight 1 cm distal CBD stricture that was not able to be passed with wire. -Interventional radiology was consulted for PTC of biliary decompression and stricture brushing which was performed on 08/08/2015 -Patient remained stable overnight with a gradual trend down of her liver function -on 12/23, pt's biliary drain output stopped and was not able to be flushed. Discussed case with IR and pt subsequently underwent cholangiogram with drain exchange on 12/24 -Drain now working  and patient cleared for discharge per IR  2. Normocytic anemia  - Mild, normocytic, and not noted on prior labs  3. Hypokalemia  - Continue to replace as needed -Normalized  4. Hyperglycemia - Patient is not a known diabetic -Likely transient - A1c 4.8  5. Hypertension  - There is a modest elevation in blood pressure since admission - Will continue home dose lisinopril and Norvasc - Continue to monitor, adjust regimen as needed  6. Hypothyroidism  - Continue home dose Synthroid, with monitoring  7. Weight loss, underweight - BMI less than 18.5, patient complains of loss of appetite and recent 5-10 pound weight loss -Nutrition was consulted  Procedures:  EUS, ERCP 08/08/2015  PTC by interventional radiology on 08/08/2015  Consultations:  Gastroenterology  Interventional radiology  Discharge Exam: Filed Vitals:   08/08/15 1746 08/08/15 1839 08/09/15 0514 08/09/15 1300  BP: 154/88 137/59 125/59 140/66  Pulse: 85 62 71 81  Temp:  97.5 F (36.4 C) 98.4 F (36.9 C) 97.7 F (36.5 C)  TempSrc:  Axillary Oral Oral  Resp: _0 Height:      Weight:      SpO2: 100% 98% 99% 98%    General: awake, in nad Cardiovascular: regular, s1, s2 Respiratory: normal resp effort, no wheezing  Discharge Instructions     Medication List    TAKE these medications        amLODipine 10 MG tablet  Commonly known as:  NORVASC     levothyroxine 25 MCG tablet  Commonly known as:  SYNTHROID, LEVOTHROID  Take 25 mcg by mouth daily before breakfast.  lisinopril 5 MG tablet  Commonly known as:  PRINIVIL,ZESTRIL  Take 5 mg by mouth daily.     MULTIVITAMIN GUMMIES ADULT Chew  Chew 1 tablet by mouth daily.     oxyCODONE 5 MG immediate release tablet  Commonly known as:  Oxy IR/ROXICODONE  Take 1 tablet (5 mg total) by mouth every 4 (four) hours as needed for severe pain.       No Known Allergies Follow-up Information    Follow up with Harl Bowie, MD  On 08/13/2015.   Specialty:  Gastroenterology   Why:  9:00 am   Contact information:   [place] [address] [phone]       Schedule an appointment as soon as possible for a visit with Return to IR in approximately 4 weeks for placement of an internal wall flex stent.       The results of significant diagnostics from this hospitalization (including imaging, microbiology, ancillary and laboratory) are listed below for reference.    Significant Diagnostic Studies: Ct Abdomen Pelvis W Contrast  08/06/2015  CLINICAL DATA:  Increasing fatigue since October. Yesterday patient becmae jaundiced. Elevated liver function studies. EXAM: CT ABDOMEN AND PELVIS WITH CONTRAST TECHNIQUE: Multidetector CT imaging of the abdomen and pelvis was performed using the standard protocol following bolus administration of intravenous contrast. CONTRAST:  126m OMNIPAQUE IOHEXOL 300 MG/ML  SOLN COMPARISON:  CT scan 06/19/2013 FINDINGS: Lower chest: The lung bases are clear of acute process. No pleural effusion or pulmonary nodules. The heart is normal in size for age. No pericardial effusion. The small hiatal hernia is noted. Hepatobiliary: Significant intrahepatic biliary dilatation. The common hepatic duct is also dilated and there is an abrupt cut off of the common bile duct as it enters the pancreatic head area. Findings could be due to a common bile duct stone, biliary tumor (cholangiocarcinoma) or possibly a pancreatic lesion. I believe a biliary tumor is most likely. The pancreatic duct is normal and I do not see an obvious pancreatic mass. MRI abdomen/ MRCP may be helpful for further evaluation if the patient can hold her breath adequately. Otherwise, ERCP may be helpful for both diagnostic and therapeutic purposes if the obstruction can be crossed. No focal hepatic lesions are identified. The portal and hepatic veins are patent. Pancreas: No definite pancreatic mass or inflammation. The  pancreatic duct is normal in caliber and has a normal course. Spleen: Normal size.  No focal lesions. Adrenals/Urinary Tract: The adrenal glands and kidneys are normal. Stomach/Bowel: The stomach, duodenum, small bowel and colon are unremarkable. No inflammatory changes, mass lesions or obstructive findings. Diffuse colonic diverticulosis but no findings for acute diverticulitis. The terminal ileum is normal. The appendix is normal. Vascular/Lymphatic: No mesenteric or retroperitoneal mass or adenopathy. Specifically, I do not see any portal or periportal mass or adenopathy. Scattered atherosclerotic calcifications involving the aorta but fairly minimal for age. The major venous structures are patent. Reproductive: The uterus and ovaries are normal. Other: The bladder is normal. No pelvic mass, adenopathy or free pelvic fluid collections. No inguinal mass or adenopathy. Musculoskeletal: Left convex thoracolumbar scoliosis and associated degenerative lumbar spondylosis. No acute bony findings or destructive bony changes. IMPRESSION: 1. Intra and extrahepatic biliary dilatation with an abrupt cut off of the common bile duct near its entry into the pancreatic head. Findings most suspicious for a biliary tumor (cholangiocarcinoma) but a common bile duct stone and pancreatic lesion are also possible. MRCP may be helpful for further evaluation if  the patient can hold her breath. Otherwise, ERCP is suggested for both diagnostic and therapeutic purposes. 2. Moderate distention of the gallbladder without obvious gallstones. 3. No liver lesions or abdominal lymphadenopathy. Electronically Signed   By: Marijo Sanes M.D.   On: 08/06/2015 17:34   Dg Ercp  08/08/2015  CLINICAL DATA:  Distal biliary obstruction EXAM: ERCP attempted, failed endoscopic stent placement TECHNIQUE: Multiple spot images obtained with the fluoroscopic device and submitted for interpretation post-procedure. FLUOROSCOPY TIME:  Radiation Exposure Index  (as provided by the fluoroscopic device): 19.9 mGy If the device does not provide the exposure index: Fluoroscopy Time:  4 minutes 31 seconds Number of Acquired Images:  2 COMPARISON:  08/06/2015 FINDINGS: Limited opacification of the distal CBD over a short segment. Distal CBD obstruction/severe stricture suspected. IMPRESSION: Incomplete cholangiogram.  Distal CBD obstruction/severe stricture. These images were submitted for radiologic interpretation only. Please see the procedural report for the amount of contrast and the fluoroscopy time utilized. Electronically Signed   By: Jerilynn Mages.  Shick M.D.   On: 08/08/2015 15:04   Ir Int Lianne Cure Biliary Drain With Cholangiogram  08/09/2015  CLINICAL DATA:  81 year old female with obstructed jaundice and severe hyperbilirubinemia. ERCP was unsuccessful secondary to high-grade stenosis in the distal common bile duct. Patient presents for percutaneous transhepatic cholangiography with possible biliary duct biopsy and placement of a biliary drain EXAM: IR INT-EXT BILIARY DRAIN W/ CHOLANGIOGRAM; IR ENDOLUMINAL BIOPSY OF BILIARY TREE Date: 08/09/2015 PROCEDURE: 1. Ultrasound-guided puncture of a peripheral left biliary duct 2. Percutaneous transhepatic cholangiogram 3. Successful crossing of distal common bile duct occlusion 4. Alligator forceps biopsy of distal common bile duct 5. Placement of an internal/external biliary drainage catheter Interventional Radiologist:  Criselda Peaches, MD ANESTHESIA/SEDATION: Moderate (conscious) sedation was used. 4 mg Versed, 100 mcg Fentanyl were administered intravenously. The patient's vital signs were monitored continuously by radiology nursing throughout the procedure. Sedation Time: 39 minutes MEDICATIONS: Patient Unasyn intravenously prior to the attempted ERCP. No additional antibiotics were administered. FLUOROSCOPY TIME:  13 minutes 6 seconds for a total of 126 mGy CONTRAST:  49m OMNIPAQUE IOHEXOL 300 MG/ML  SOLN TECHNIQUE: Informed  consent was obtained from the patient following explanation of the procedure, risks, benefits and alternatives. The patient understands, agrees and consents for the procedure. All questions were addressed. A time out was performed. Maximal barrier sterile technique utilized including caps, mask, sterile gowns, sterile gloves, large sterile drape, hand hygiene, and Betadine skin prep. The upper abdomen was interrogated with ultrasound. There is a very ductal dilatation is visible by sonography. Local anesthesia was attained by infiltration with 1% lidocaine. A small dermatotomy was made. Under real-time sonographic guidance, a peripheral branch of the left segment 3 biliary tree was punctured with a 2[place] Lakesneedle. Puncture was confirmed by return of bile from the needle as well as a gentle hand injection of contrast material under fluoroscopy. A night tracks wire was then carefully advanced into the biliary tree and coiled within the common bile duct. The needle was then exchanged for the Accustick sheath. Through the Accustick sheath a percutaneous transhepatic cholangiogram was performed. There is significant intra and extrahepatic biliary ductal dilatation. There is complete smooth occlusion of the distal common bile duct. No significant irregularity or evidence of choledocholithiasis. The cystic duct is patent. There is opacifications of the gallbladder lumen. Using a 4 FP[place]istanglide catheter and hydrophilic wire coaxially through the Accustick sheath, the wire and catheter were successfully navigated through the occlusion into the duodenum.  Of note, the occlusion is very tight and crossing the lesion was difficult. A superstiff Amplatz wire was advanced through the Accustick sheath and coiled in the duodenum. The skin tract was then dilated and a 7 [place] cook flexor sheath advanced over the wire and positioned at the distal common bile duct. The Cook flexible alligator forceps were then advanced  coaxially through the sheath next to the safety wire and several biopsy specimens were obtained from the distal common bile duct occlusion. The specimens were placed in formalin and sent to pathology for further analysis. The skin tract was further dilated and a [place] biliary drainage catheter was advanced over the wire and formed with the locking loop in the duodenum. The proximal side hole is in the left segment 3 ductal system. The catheter was connected to gravity bag drainage and secured to the skin with 0 Prolene suture. The patient tolerated the procedure well. COMPLICATIONS: None Estimated blood loss:  0 mL IMPRESSION: 1. Percutaneous transhepatic cholangiogram demonstrates complete occlusion of the distal common bile duct. The occlusion appears smooth and is not particularly irregular. Differential considerations include both benign and malignant stricture. 2. Difficulty but ultimately successful crossing of the distal common bile duct occlusion. 3. Transhepatic alligator forceps biopsy of the distal common bile duct occlusion. 4. Placement of a 10 French internal/external biliary drainage catheter. PLAN: 1. Maintain tube to external gravity drainage until bilirubin has significantly trended down. At that point, since this is an internal/external drainage catheter the tube can be capped. 2. Monitor electrolytes and fluid balance and hydrate and replete electrolytes as needed while externally draining. 3. Pathology results are pending. 4. Recommend return to interventional radiology in approximately 4 weeks 4 placement of an internal WalFlex stent. Signed, Criselda Peaches, MD [facility] Electronically Signed   By: Jacqulynn Cadet M.D.   On: 08/09/2015 07:45   Ir Endoluminal Bx Of Biliary Tree  08/09/2015  CLINICAL DATA:  81 year old female with obstructed jaundice and severe hyperbilirubinemia. ERCP was unsuccessful secondary to  high-grade stenosis in the distal common bile duct. Patient presents for percutaneous transhepatic cholangiography with possible biliary duct biopsy and placement of a biliary drain EXAM: IR INT-EXT BILIARY DRAIN W/ CHOLANGIOGRAM; IR ENDOLUMINAL BIOPSY OF BILIARY TREE Date: 08/09/2015 PROCEDURE: 1. Ultrasound-guided puncture of a peripheral left biliary duct 2. Percutaneous transhepatic cholangiogram 3. Successful crossing of distal common bile duct occlusion 4. Alligator forceps biopsy of distal common bile duct 5. Placement of an internal/external biliary drainage catheter Interventional Radiologist:  Criselda Peaches, MD ANESTHESIA/SEDATION: Moderate (conscious) sedation was used. 4 mg Versed, 100 mcg Fentanyl were administered intravenously. The patient's vital signs were monitored continuously by radiology nursing throughout the procedure. Sedation Time: 39 minutes MEDICATIONS: Patient Unasyn intravenously prior to the attempted ERCP. No additional antibiotics were administered. FLUOROSCOPY TIME:  13 minutes 6 seconds for a total of 126 mGy CONTRAST:  40m OMNIPAQUE IOHEXOL 300 MG/ML  SOLN TECHNIQUE: Informed consent was obtained from the patient following explanation of the procedure, risks, benefits and alternatives. The patient understands, agrees and consents for the procedure. All questions were addressed. A time out was performed. Maximal barrier sterile technique utilized including caps, mask, sterile gowns, sterile gloves, large sterile drape, hand hygiene, and Betadine skin prep. The upper abdomen was interrogated with ultrasound. There is a very ductal dilatation is visible by sonography. Local anesthesia was attained by infiltration with 1% lidocaine. A small dermatotomy was made. Under real-time sonographic  guidance, a peripheral branch of the left segment 3 biliary tree was punctured with a [place] needle. Puncture was confirmed by return of bile from the needle as well as a gentle hand  injection of contrast material under fluoroscopy. A night tracks wire was then carefully advanced into the biliary tree and coiled within the common bile duct. The needle was then exchanged for the Accustick sheath. Through the Accustick sheath a percutaneous transhepatic cholangiogram was performed. There is significant intra and extrahepatic biliary ductal dilatation. There is complete smooth occlusion of the distal common bile duct. No significant irregularity or evidence of choledocholithiasis. The cystic duct is patent. There is opacifications of the gallbladder lumen. Using a 4 [place] glide catheter and hydrophilic wire coaxially through the Accustick sheath, the wire and catheter were successfully navigated through the occlusion into the duodenum. Of note, the occlusion is very tight and crossing the lesion was difficult. A superstiff Amplatz wire was advanced through the Accustick sheath and coiled in the duodenum. The skin tract was then dilated and a 7 [place] cook flexor sheath advanced over the wire and positioned at the distal common bile duct. The Cook flexible alligator forceps were then advanced coaxially through the sheath next to the safety wire and several biopsy specimens were obtained from the distal common bile duct occlusion. The specimens were placed in formalin and sent to pathology for further analysis. The skin tract was further dilated and a [place] biliary drainage catheter was advanced over the wire and formed with the locking loop in the duodenum. The proximal side hole is in the left segment 3 ductal system. The catheter was connected to gravity bag drainage and secured to the skin with 0 Prolene suture. The patient tolerated the procedure well. COMPLICATIONS: None Estimated blood loss:  0 mL IMPRESSION: 1. Percutaneous transhepatic cholangiogram demonstrates complete occlusion of the distal common bile duct. The occlusion appears smooth and is not particularly irregular.  Differential considerations include both benign and malignant stricture. 2. Difficulty but ultimately successful crossing of the distal common bile duct occlusion. 3. Transhepatic alligator forceps biopsy of the distal common bile duct occlusion. 4. Placement of a 10 French internal/external biliary drainage catheter. PLAN: 1. Maintain tube to external gravity drainage until bilirubin has significantly trended down. At that point, since this is an internal/external drainage catheter the tube can be capped. 2. Monitor electrolytes and fluid balance and hydrate and replete electrolytes as needed while externally draining. 3. Pathology results are pending. 4. Recommend return to interventional radiology in approximately 4 weeks 4 placement of an internal WalFlex stent. Signed, Criselda Peaches, MD [facility] Radiology Electronically Signed   By: Jacqulynn Cadet M.D.   On: 08/09/2015 07:45    Microbiology: No results found for this or any previous visit (from the past 240 hour(s)).   Labs: Basic Metabolic Panel:  Recent Labs Lab 08/06/15 1429 08/07/15 0515 08/08/15 0510 08/09/15 0455 08/09/15 1345  NA 134* 136 135 137 136  K 3.6 4.4 4.9 4.8 4.4  CL 97* 103 105 101 100*  CO2 _0 GLUCOSE 142* 90 106* 124* 115*  BUN _1 CREATININE 0.36* 0.38* 0.41* 0.54 0.43*  CALCIUM 9.8 9.4 9.2 9.2 9.4   Liver Function Tests:  Recent Labs Lab 08/06/15 1429 08/07/15 0515 08/08/15 0510 08/09/15 0455 08/09/15 1345  AST 273* 249* 195* 166* 140*  ALT 361* 324* 282* 264* 256*  ALKPHOS 862* 688* 631* 633* 624*  BILITOT 16.6* 15.5* 13.8* 11.5* 11.0*  PROT 7.5 6.3* 5.8* 5.9* 6.7  ALBUMIN 4.2 3.5 3.1* 3.4* 3.6    Recent Labs Lab 08/06/15 1429  LIPASE 55*   No results for input(s): AMMONIA in the last 168 hours. CBC:  Recent Labs Lab 08/06/15 1428 08/06/15 1429 08/07/15 0515  WBC  --  5.0 4.5  NEUTROABS  --  3.8  --    HGB 13.6 11.0* 10.3*  HCT 40.0 31.8* 29.8*  MCV  --  93.8 94.3  PLT  --  411* 376   Cardiac Enzymes: No results for input(s): CKTOTAL, CKMB, CKMBINDEX, TROPONINI in the last 168 hours. BNP: BNP (last 3 results) No results for input(s): BNP in the last 8760 hours.  ProBNP (last 3 results) No results for input(s): PROBNP in the last 8760 hours.  CBG:  Recent Labs Lab 08/07/15 0738 08/08/15 0746 08/09/15 0753  GLUCAP 89 92 91    Signed:  Kailee Essman K  [facility] 08/09/2015, 4:21 PM

## 2015-08-09 NOTE — Treatment Plan (Signed)
Called by RN. Prior to finalizing d/c home, pt noted to have increased abd pain, bloody drainage. Later in afternoon, drain output essentially ceased per RN. Abd xray done with tube appearing to be in correct position. CT abd ordered, pending. Discussed case with IR on call who recommends adding CBC and OK to attempt flushing as tolerated. IR to follow up on CT already ordered and will d/w Hospitalist night coverage.   I have since updated the Hospitalist night cross cover provider the above plan

## 2015-08-09 NOTE — Progress Notes (Signed)
[facility] Progress Note  Subjective:  Feels sore and bloated.  Otherwise doing ok.  Ate a small amount for breakfast.  Objective:  Vital signs in last 24 hours: Temp:  [97.5 F (36.4 C)-98.6 F (37 C)] 98.4 F (36.9 C) (12/23 0514) Pulse Rate:  [62-92] 71 (12/23 0514) Resp:  [15-28] 16 (12/23 0514) BP: (123-173)/(57-88) 125/59 mmHg (12/23 0514) SpO2:  [98 %-100 %] 99 % (12/23 0514) Weight:  [93 lb (42.185 kg)] 93 lb (42.185 kg) (12/22 0909) Last BM Date: 08/07/15 General:  Alert, Well-developed, in NAD; jaundice noted. Heart:  Regular rate and rhythm; no murmurs Pulm:  CTAB.  No W/R/R. Abdomen:  Soft, non-distended. Normal bowel sounds.  Some right sided TTP. Extremities:  Without edema. Neurologic:  Alert and  oriented x 4;  grossly normal neurologically. Psych:  Alert and cooperative. Normal mood and affect.  Intake/Output from previous day: 12/22 0701 - 12/23 0700 In: 2800 [I.V.:2800] Out: 970 [Drains:970]  Lab Results:  Recent Labs  08/06/15 1428 08/06/15 1429 08/07/15 0515  WBC  --  5.0 4.5  HGB 13.6 11.0* 10.3*  HCT 40.0 31.8* 29.8*  PLT  --  411* 376   BMET  Recent Labs  08/07/15 0515 08/08/15 0510 08/09/15 0455  NA 136 135 137  K 4.4 4.9 4.8  CL 103 105 101  CO2 '23 24 27  '$ GLUCOSE 90 106* 124*  BUN '8 6 8  '$ CREATININE 0.38* 0.41* 0.54  CALCIUM 9.4 9.2 9.2   LFT  Recent Labs  08/06/15 2240  08/09/15 0455  PROT  --   < > 5.9*  ALBUMIN  --   < > 3.4*  AST  --   < > 166*  ALT  --   < > 264*  ALKPHOS  --   < > 633*  BILITOT  --   < > 11.5*  BILIDIR 9.2*  --   --   < > = values in this interval not displayed. PT/INR  Recent Labs  08/06/15 1429  LABPROT 12.8  INR 0.95   Dg Ercp  08/08/2015  CLINICAL DATA:  Distal biliary obstruction EXAM: ERCP attempted, failed endoscopic stent placement TECHNIQUE: Multiple spot images obtained with the fluoroscopic device and submitted for interpretation post-procedure. FLUOROSCOPY  TIME:  Radiation Exposure Index (as provided by the fluoroscopic device): 19.9 mGy If the device does not provide the exposure index: Fluoroscopy Time:  4 minutes 31 seconds Number of Acquired Images:  2 COMPARISON:  08/06/2015 FINDINGS: Limited opacification of the distal CBD over a short segment. Distal CBD obstruction/severe stricture suspected. IMPRESSION: Incomplete cholangiogram.  Distal CBD obstruction/severe stricture. These images were submitted for radiologic interpretation only. Please see the procedural report for the amount of contrast and the fluoroscopy time utilized. Electronically Signed   By: Jerilynn Mages.  Shick M.D.   On: 08/08/2015 15:04   Ir Int Lianne Cure Biliary Drain With Cholangiogram  08/09/2015  CLINICAL DATA:  81 year old female with obstructed jaundice and severe hyperbilirubinemia. ERCP was unsuccessful secondary to high-grade stenosis in the distal common bile duct. Patient presents for percutaneous transhepatic cholangiography with possible biliary duct biopsy and placement of a biliary drain EXAM: IR INT-EXT BILIARY DRAIN W/ CHOLANGIOGRAM; IR ENDOLUMINAL BIOPSY OF BILIARY TREE Date: 08/09/2015 PROCEDURE: 1. Ultrasound-guided puncture of a peripheral left biliary duct 2. Percutaneous transhepatic cholangiogram 3. Successful crossing of distal common bile duct occlusion 4. Alligator forceps biopsy of distal common bile duct 5. Placement of an internal/external biliary drainage catheter Interventional  Radiologist:  Sterling Big, MD ANESTHESIA/SEDATION: Moderate (conscious) sedation was used. 4 mg Versed, 100 mcg Fentanyl were administered intravenously. The patient's vital signs were monitored continuously by radiology nursing throughout the procedure. Sedation Time: 39 minutes MEDICATIONS: Patient Unasyn intravenously prior to the attempted ERCP. No additional antibiotics were administered. FLUOROSCOPY TIME:  13 minutes 6 seconds for a total of 126 mGy CONTRAST:  61mL OMNIPAQUE IOHEXOL 300  MG/ML  SOLN TECHNIQUE: Informed consent was obtained from the patient following explanation of the procedure, risks, benefits and alternatives. The patient understands, agrees and consents for the procedure. All questions were addressed. A time out was performed. Maximal barrier sterile technique utilized including caps, mask, sterile gowns, sterile gloves, large sterile drape, hand hygiene, and Betadine skin prep. The upper abdomen was interrogated with ultrasound. There is a very ductal dilatation is visible by sonography. Local anesthesia was attained by infiltration with 1% lidocaine. A small dermatotomy was made. Under real-time sonographic guidance, a peripheral branch of the left segment 3 biliary tree was punctured with a 22 gauge Chiba needle. Puncture was confirmed by return of bile from the needle as well as a gentle hand injection of contrast material under fluoroscopy. A night tracks wire was then carefully advanced into the biliary tree and coiled within the common bile duct. The needle was then exchanged for the Accustick sheath. Through the Accustick sheath a percutaneous transhepatic cholangiogram was performed. There is significant intra and extrahepatic biliary ductal dilatation. There is complete smooth occlusion of the distal common bile duct. No significant irregularity or evidence of choledocholithiasis. The cystic duct is patent. There is opacifications of the gallbladder lumen. Using a 4 [place] glide catheter and hydrophilic wire coaxially through the Accustick sheath, the wire and catheter were successfully navigated through the occlusion into the duodenum. Of note, the occlusion is very tight and crossing the lesion was difficult. A superstiff Amplatz wire was advanced through the Accustick sheath and coiled in the duodenum. The skin tract was then dilated and a 7 [place] cook flexor sheath advanced over the wire and positioned at the distal common bile duct. The Cook flexible alligator  forceps were then advanced coaxially through the sheath next to the safety wire and several biopsy specimens were obtained from the distal common bile duct occlusion. The specimens were placed in formalin and sent to pathology for further analysis. The skin tract was further dilated and a [place] biliary drainage catheter was advanced over the wire and formed with the locking loop in the duodenum. The proximal side hole is in the left segment 3 ductal system. The catheter was connected to gravity bag drainage and secured to the skin with 0 Prolene suture. The patient tolerated the procedure well. COMPLICATIONS: None Estimated blood loss:  0 mL IMPRESSION: 1. Percutaneous transhepatic cholangiogram demonstrates complete occlusion of the distal common bile duct. The occlusion appears smooth and is not particularly irregular. Differential considerations include both benign and malignant stricture. 2. Difficulty but ultimately successful crossing of the distal common bile duct occlusion. 3. Transhepatic alligator forceps biopsy of the distal common bile duct occlusion. 4. Placement of a 10 French internal/external biliary drainage catheter. PLAN: 1. Maintain tube to external gravity drainage until bilirubin has significantly trended down. At that point, since this is an internal/external drainage catheter the tube can be capped. 2. Monitor electrolytes and fluid balance and hydrate and replete electrolytes as needed while externally draining. 3. Pathology results are pending. 4. Recommend return to interventional radiology  in approximately 4 weeks 4 placement of an internal WalFlex stent. Signed, Criselda Peaches, MD [facility] Radiology Electronically Signed   By: Jacqulynn Cadet M.D.   On: 08/09/2015 07:45   Ir Endoluminal Bx Of Biliary Tree  08/09/2015  CLINICAL DATA:  81 year old female with obstructed jaundice and severe hyperbilirubinemia. ERCP was  unsuccessful secondary to high-grade stenosis in the distal common bile duct. Patient presents for percutaneous transhepatic cholangiography with possible biliary duct biopsy and placement of a biliary drain EXAM: IR INT-EXT BILIARY DRAIN W/ CHOLANGIOGRAM; IR ENDOLUMINAL BIOPSY OF BILIARY TREE Date: 08/09/2015 PROCEDURE: 1. Ultrasound-guided puncture of a peripheral left biliary duct 2. Percutaneous transhepatic cholangiogram 3. Successful crossing of distal common bile duct occlusion 4. Alligator forceps biopsy of distal common bile duct 5. Placement of an internal/external biliary drainage catheter Interventional Radiologist:  Criselda Peaches, MD ANESTHESIA/SEDATION: Moderate (conscious) sedation was used. 4 mg Versed, 100 mcg Fentanyl were administered intravenously. The patient's vital signs were monitored continuously by radiology nursing throughout the procedure. Sedation Time: 39 minutes MEDICATIONS: Patient Unasyn intravenously prior to the attempted ERCP. No additional antibiotics were administered. FLUOROSCOPY TIME:  13 minutes 6 seconds for a total of 126 mGy CONTRAST:  43m OMNIPAQUE IOHEXOL 300 MG/ML  SOLN TECHNIQUE: Informed consent was obtained from the patient following explanation of the procedure, risks, benefits and alternatives. The patient understands, agrees and consents for the procedure. All questions were addressed. A time out was performed. Maximal barrier sterile technique utilized including caps, mask, sterile gowns, sterile gloves, large sterile drape, hand hygiene, and Betadine skin prep. The upper abdomen was interrogated with ultrasound. There is a very ductal dilatation is visible by sonography. Local anesthesia was attained by infiltration with 1% lidocaine. A small dermatotomy was made. Under real-time sonographic guidance, a peripheral branch of the left segment 3 biliary tree was punctured with a 2Kendallneedle. Puncture was confirmed by return of bile from the needle  as well as a gentle hand injection of contrast material under fluoroscopy. A night tracks wire was then carefully advanced into the biliary tree and coiled within the common bile duct. The needle was then exchanged for the Accustick sheath. Through the Accustick sheath a percutaneous transhepatic cholangiogram was performed. There is significant intra and extrahepatic biliary ductal dilatation. There is complete smooth occlusion of the distal common bile duct. No significant irregularity or evidence of choledocholithiasis. The cystic duct is patent. There is opacifications of the gallbladder lumen. Using a 4 [place] catheter and hydrophilic wire coaxially through the Accustick sheath, the wire and catheter were successfully navigated through the occlusion into the duodenum. Of note, the occlusion is very tight and crossing the lesion was difficult. A superstiff Amplatz wire was advanced through the Accustick sheath and coiled in the duodenum. The skin tract was then dilated and a 7 [place] flexor sheath advanced over the wire and positioned at the distal common bile duct. The Cook flexible alligator forceps were then advanced coaxially through the sheath next to the safety wire and several biopsy specimens were obtained from the distal common bile duct occlusion. The specimens were placed in formalin and sent to pathology for further analysis. The skin tract was further dilated and a [place]iliary drainage catheter was advanced over the wire and formed with the locking loop in the duodenum. The proximal side hole is in the left segment 3 ductal system. The catheter was connected to gravity bag drainage  and secured to the skin with 0 Prolene suture. The patient tolerated the procedure well. COMPLICATIONS: None Estimated blood loss:  0 mL IMPRESSION: 1. Percutaneous transhepatic cholangiogram demonstrates complete occlusion of the distal common bile duct. The occlusion appears smooth and is not  particularly irregular. Differential considerations include both benign and malignant stricture. 2. Difficulty but ultimately successful crossing of the distal common bile duct occlusion. 3. Transhepatic alligator forceps biopsy of the distal common bile duct occlusion. 4. Placement of a 10 French internal/external biliary drainage catheter. PLAN: 1. Maintain tube to external gravity drainage until bilirubin has significantly trended down. At that point, since this is an internal/external drainage catheter the tube can be capped. 2. Monitor electrolytes and fluid balance and hydrate and replete electrolytes as needed while externally draining. 3. Pathology results are pending. 4. Recommend return to interventional radiology in approximately 4 weeks 4 placement of an internal WalFlex stent. Signed, Criselda Peaches, MD [facility] Radiology Electronically Signed   By: Jacqulynn Cadet M.D.   On: 08/09/2015 07:45    Assessment / Plan: *81 year old female with painless jaundice/biliary obstruction:  Bili 15.5 and CT scan showing biliary dilitation with cut off of CBD suspicious for CBD tumor or pancreatic lesion.  EUS/ERCP unsuccessful 12/22 so she underwent PTC with internal/external drain placement by IR.  LFT's trending down.  Will repeat LFT's this afternoon and if continue to trend down then ok to go home (as long as cleared by IR as well).  Has follow-up with Dr. Silverio Decamp on 12/27 at which time she will have repeat labs and hopefully can discuss pathology/biopsy results at that time.   LOS: 3 days   ZEHR, JESSICA D.  08/09/2015, 9:01 AM  Pager number [phone]    Attending physician's note   I have taken an interval history, reviewed the chart and examined the patient. I agree with the Advanced Practitioner's note, impression and recommendations.  Bili is down trending with bilious output in the internal external drain. Ok to discharge home, if no  concerns from IR. f/u labs and office visit next week on 12/27  Damaris Hippo, MD [phone] Mon-Fri 8a-5p [phone] after 5p, weekends, holidays

## 2015-08-09 NOTE — Progress Notes (Signed)
Patient ID: RELENA MCADAM, female   DOB: April 03, 1930, 80 y.o.   MRN: [id number] CT tonight shows no acute bleed or hematoma.  Stable biliary drain placement and no significant change in bile ducts.  Drain is occluded likely from clot related to the forceps biopsy yesterday.  Recs:   Keep drain to gravity bag even though its clogged Control pain overnight NPO after MN We will exchange tube tomorrow am  If any questions call [phone]

## 2015-08-09 NOTE — Discharge Instructions (Signed)
Please come to the office Tuesday morning, December 27th at 8 AM to have labs drawn prior to your 9 AM appt with Dr. Silverio Decamp.  The lab is on the basement level and our office is on the 3rd floor.

## 2015-08-09 NOTE — Progress Notes (Signed)
Patient ID: Summer Jones, female   DOB: 10-Aug-1930, 81 y.o.   MRN: [id number]          Subjective: Patient doing fairly well today; does have some soreness at biliary drain site; denies nausea, vomiting; constipated but passing gas.   Allergies: Review of patient's allergies indicates no known allergies.  Medications: Prior to Admission medications   Medication Sig Start Date End Date Taking? Authorizing Provider  amLODipine (NORVASC) 10 MG tablet  06/16/15  Yes Historical Provider, MD  levothyroxine (SYNTHROID, LEVOTHROID) 25 MCG tablet Take 25 mcg by mouth daily before breakfast.   Yes Historical Provider, MD  lisinopril (PRINIVIL,ZESTRIL) 5 MG tablet Take 5 mg by mouth daily.   Yes Historical Provider, MD  Multiple Vitamins-Minerals (MULTIVITAMIN GUMMIES ADULT) CHEW Chew 1 tablet by mouth daily.   Yes Historical Provider, MD     Vital Signs: BP 125/59 mmHg  Pulse 71  Temp(Src) 98.4 F (36.9 C) (Oral)  Resp 16  Ht 5' (1.524 m)  Wt 93 lb (42.185 kg)  BMI 18.16 kg/m2  SpO2 99%  Physical Exam patient awake, alert. Biliary drain intact; insertion site okay, mildly tender to palpation. Output approximately 1 L dark bile; abdomen soft, mildly distended.  Imaging: Ct Abdomen Pelvis W Contrast  08/06/2015  CLINICAL DATA:  Increasing fatigue since October. Yesterday patient becmae jaundiced. Elevated liver function studies. EXAM: CT ABDOMEN AND PELVIS WITH CONTRAST TECHNIQUE: Multidetector CT imaging of the abdomen and pelvis was performed using the standard protocol following bolus administration of intravenous contrast. CONTRAST:  121mL OMNIPAQUE IOHEXOL 300 MG/ML  SOLN COMPARISON:  CT scan 06/19/2013 FINDINGS: Lower chest: The lung bases are clear of acute process. No pleural effusion or pulmonary nodules. The heart is normal in size for age. No pericardial effusion. The small hiatal hernia is noted. Hepatobiliary: Significant intrahepatic biliary dilatation. The common hepatic  duct is also dilated and there is an abrupt cut off of the common bile duct as it enters the pancreatic head area. Findings could be due to a common bile duct stone, biliary tumor (cholangiocarcinoma) or possibly a pancreatic lesion. I believe a biliary tumor is most likely. The pancreatic duct is normal and I do not see an obvious pancreatic mass. MRI abdomen/ MRCP may be helpful for further evaluation if the patient can hold her breath adequately. Otherwise, ERCP may be helpful for both diagnostic and therapeutic purposes if the obstruction can be crossed. No focal hepatic lesions are identified. The portal and hepatic veins are patent. Pancreas: No definite pancreatic mass or inflammation. The pancreatic duct is normal in caliber and has a normal course. Spleen: Normal size.  No focal lesions. Adrenals/Urinary Tract: The adrenal glands and kidneys are normal. Stomach/Bowel: The stomach, duodenum, small bowel and colon are unremarkable. No inflammatory changes, mass lesions or obstructive findings. Diffuse colonic diverticulosis but no findings for acute diverticulitis. The terminal ileum is normal. The appendix is normal. Vascular/Lymphatic: No mesenteric or retroperitoneal mass or adenopathy. Specifically, I do not see any portal or periportal mass or adenopathy. Scattered atherosclerotic calcifications involving the aorta but fairly minimal for age. The major venous structures are patent. Reproductive: The uterus and ovaries are normal. Other: The bladder is normal. No pelvic mass, adenopathy or free pelvic fluid collections. No inguinal mass or adenopathy. Musculoskeletal: Left convex thoracolumbar scoliosis and associated degenerative lumbar spondylosis. No acute bony findings or destructive bony changes. IMPRESSION: 1. Intra and extrahepatic biliary dilatation with an abrupt cut off of the common bile duct  near its entry into the pancreatic head. Findings most suspicious for a biliary tumor  (cholangiocarcinoma) but a common bile duct stone and pancreatic lesion are also possible. MRCP may be helpful for further evaluation if the patient can hold her breath. Otherwise, ERCP is suggested for both diagnostic and therapeutic purposes. 2. Moderate distention of the gallbladder without obvious gallstones. 3. No liver lesions or abdominal lymphadenopathy. Electronically Signed   By: Marijo Sanes M.D.   On: 08/06/2015 17:34   Dg Ercp  08/08/2015  CLINICAL DATA:  Distal biliary obstruction EXAM: ERCP attempted, failed endoscopic stent placement TECHNIQUE: Multiple spot images obtained with the fluoroscopic device and submitted for interpretation post-procedure. FLUOROSCOPY TIME:  Radiation Exposure Index (as provided by the fluoroscopic device): 19.9 mGy If the device does not provide the exposure index: Fluoroscopy Time:  4 minutes 31 seconds Number of Acquired Images:  2 COMPARISON:  08/06/2015 FINDINGS: Limited opacification of the distal CBD over a short segment. Distal CBD obstruction/severe stricture suspected. IMPRESSION: Incomplete cholangiogram.  Distal CBD obstruction/severe stricture. These images were submitted for radiologic interpretation only. Please see the procedural report for the amount of contrast and the fluoroscopy time utilized. Electronically Signed   By: Jerilynn Mages.  Shick M.D.   On: 08/08/2015 15:04   Ir Int Lianne Cure Biliary Drain With Cholangiogram  08/09/2015  CLINICAL DATA:  81 year old female with obstructed jaundice and severe hyperbilirubinemia. ERCP was unsuccessful secondary to high-grade stenosis in the distal common bile duct. Patient presents for percutaneous transhepatic cholangiography with possible biliary duct biopsy and placement of a biliary drain EXAM: IR INT-EXT BILIARY DRAIN W/ CHOLANGIOGRAM; IR ENDOLUMINAL BIOPSY OF BILIARY TREE Date: 08/09/2015 PROCEDURE: 1. Ultrasound-guided puncture of a peripheral left biliary duct 2. Percutaneous transhepatic cholangiogram 3.  Successful crossing of distal common bile duct occlusion 4. Alligator forceps biopsy of distal common bile duct 5. Placement of an internal/external biliary drainage catheter Interventional Radiologist:  Criselda Peaches, MD ANESTHESIA/SEDATION: Moderate (conscious) sedation was used. 4 mg Versed, 100 mcg Fentanyl were administered intravenously. The patient's vital signs were monitored continuously by radiology nursing throughout the procedure. Sedation Time: 39 minutes MEDICATIONS: Patient Unasyn intravenously prior to the attempted ERCP. No additional antibiotics were administered. FLUOROSCOPY TIME:  13 minutes 6 seconds for a total of 126 mGy CONTRAST:  66mL OMNIPAQUE IOHEXOL 300 MG/ML  SOLN TECHNIQUE: Informed consent was obtained from the patient following explanation of the procedure, risks, benefits and alternatives. The patient understands, agrees and consents for the procedure. All questions were addressed. A time out was performed. Maximal barrier sterile technique utilized including caps, mask, sterile gowns, sterile gloves, large sterile drape, hand hygiene, and Betadine skin prep. The upper abdomen was interrogated with ultrasound. There is a very ductal dilatation is visible by sonography. Local anesthesia was attained by infiltration with 1% lidocaine. A small dermatotomy was made. Under real-time sonographic guidance, a peripheral branch of the left segment 3 biliary tree was punctured with a [place] needle. Puncture was confirmed by return of bile from the needle as well as a gentle hand injection of contrast material under fluoroscopy. A night tracks wire was then carefully advanced into the biliary tree and coiled within the common bile duct. The needle was then exchanged for the Accustick sheath. Through the Accustick sheath a percutaneous transhepatic cholangiogram was performed. There is significant intra and extrahepatic biliary ductal dilatation. There is complete smooth occlusion  of the distal common bile duct. No significant irregularity or evidence of choledocholithiasis. The cystic duct  is patent. There is opacifications of the gallbladder lumen. Using a 4 [place] glide catheter and hydrophilic wire coaxially through the Accustick sheath, the wire and catheter were successfully navigated through the occlusion into the duodenum. Of note, the occlusion is very tight and crossing the lesion was difficult. A superstiff Amplatz wire was advanced through the Accustick sheath and coiled in the duodenum. The skin tract was then dilated and a 7 [place] cook flexor sheath advanced over the wire and positioned at the distal common bile duct. The Cook flexible alligator forceps were then advanced coaxially through the sheath next to the safety wire and several biopsy specimens were obtained from the distal common bile duct occlusion. The specimens were placed in formalin and sent to pathology for further analysis. The skin tract was further dilated and a [place] biliary drainage catheter was advanced over the wire and formed with the locking loop in the duodenum. The proximal side hole is in the left segment 3 ductal system. The catheter was connected to gravity bag drainage and secured to the skin with 0 Prolene suture. The patient tolerated the procedure well. COMPLICATIONS: None Estimated blood loss:  0 mL IMPRESSION: 1. Percutaneous transhepatic cholangiogram demonstrates complete occlusion of the distal common bile duct. The occlusion appears smooth and is not particularly irregular. Differential considerations include both benign and malignant stricture. 2. Difficulty but ultimately successful crossing of the distal common bile duct occlusion. 3. Transhepatic alligator forceps biopsy of the distal common bile duct occlusion. 4. Placement of a 10 French internal/external biliary drainage catheter. PLAN: 1. Maintain tube to external gravity drainage until bilirubin has significantly trended  down. At that point, since this is an internal/external drainage catheter the tube can be capped. 2. Monitor electrolytes and fluid balance and hydrate and replete electrolytes as needed while externally draining. 3. Pathology results are pending. 4. Recommend return to interventional radiology in approximately 4 weeks 4 placement of an internal WalFlex stent. Signed, Criselda Peaches, MD [facility] Radiology Electronically Signed   By: Jacqulynn Cadet M.D.   On: 08/09/2015 07:45   Ir Endoluminal Bx Of Biliary Tree  08/09/2015  CLINICAL DATA:  81 year old female with obstructed jaundice and severe hyperbilirubinemia. ERCP was unsuccessful secondary to high-grade stenosis in the distal common bile duct. Patient presents for percutaneous transhepatic cholangiography with possible biliary duct biopsy and placement of a biliary drain EXAM: IR INT-EXT BILIARY DRAIN W/ CHOLANGIOGRAM; IR ENDOLUMINAL BIOPSY OF BILIARY TREE Date: 08/09/2015 PROCEDURE: 1. Ultrasound-guided puncture of a peripheral left biliary duct 2. Percutaneous transhepatic cholangiogram 3. Successful crossing of distal common bile duct occlusion 4. Alligator forceps biopsy of distal common bile duct 5. Placement of an internal/external biliary drainage catheter Interventional Radiologist:  Criselda Peaches, MD ANESTHESIA/SEDATION: Moderate (conscious) sedation was used. 4 mg Versed, 100 mcg Fentanyl were administered intravenously. The patient's vital signs were monitored continuously by radiology nursing throughout the procedure. Sedation Time: 39 minutes MEDICATIONS: Patient Unasyn intravenously prior to the attempted ERCP. No additional antibiotics were administered. FLUOROSCOPY TIME:  13 minutes 6 seconds for a total of 126 mGy CONTRAST:  73mL OMNIPAQUE IOHEXOL 300 MG/ML  SOLN TECHNIQUE: Informed consent was obtained from the patient following explanation of the procedure, risks, benefits  and alternatives. The patient understands, agrees and consents for the procedure. All questions were addressed. A time out was performed. Maximal barrier sterile technique utilized including caps, mask, sterile gowns, sterile gloves, large sterile drape, hand hygiene, and Betadine  skin prep. The upper abdomen was interrogated with ultrasound. There is a very ductal dilatation is visible by sonography. Local anesthesia was attained by infiltration with 1% lidocaine. A small dermatotomy was made. Under real-time sonographic guidance, a peripheral branch of the left segment 3 biliary tree was punctured with a [place] needle. Puncture was confirmed by return of bile from the needle as well as a gentle hand injection of contrast material under fluoroscopy. A night tracks wire was then carefully advanced into the biliary tree and coiled within the common bile duct. The needle was then exchanged for the Accustick sheath. Through the Accustick sheath a percutaneous transhepatic cholangiogram was performed. There is significant intra and extrahepatic biliary ductal dilatation. There is complete smooth occlusion of the distal common bile duct. No significant irregularity or evidence of choledocholithiasis. The cystic duct is patent. There is opacifications of the gallbladder lumen. Using a 4 [place] glide catheter and hydrophilic wire coaxially through the Accustick sheath, the wire and catheter were successfully navigated through the occlusion into the duodenum. Of note, the occlusion is very tight and crossing the lesion was difficult. A superstiff Amplatz wire was advanced through the Accustick sheath and coiled in the duodenum. The skin tract was then dilated and a 7 [place] cook flexor sheath advanced over the wire and positioned at the distal common bile duct. The Cook flexible alligator forceps were then advanced coaxially through the sheath next to the safety wire and several biopsy specimens were obtained from  the distal common bile duct occlusion. The specimens were placed in formalin and sent to pathology for further analysis. The skin tract was further dilated and a [place] biliary drainage catheter was advanced over the wire and formed with the locking loop in the duodenum. The proximal side hole is in the left segment 3 ductal system. The catheter was connected to gravity bag drainage and secured to the skin with 0 Prolene suture. The patient tolerated the procedure well. COMPLICATIONS: None Estimated blood loss:  0 mL IMPRESSION: 1. Percutaneous transhepatic cholangiogram demonstrates complete occlusion of the distal common bile duct. The occlusion appears smooth and is not particularly irregular. Differential considerations include both benign and malignant stricture. 2. Difficulty but ultimately successful crossing of the distal common bile duct occlusion. 3. Transhepatic alligator forceps biopsy of the distal common bile duct occlusion. 4. Placement of a 10 French internal/external biliary drainage catheter. PLAN: 1. Maintain tube to external gravity drainage until bilirubin has significantly trended down. At that point, since this is an internal/external drainage catheter the tube can be capped. 2. Monitor electrolytes and fluid balance and hydrate and replete electrolytes as needed while externally draining. 3. Pathology results are pending. 4. Recommend return to interventional radiology in approximately 4 weeks 4 placement of an internal WalFlex stent. Signed, Criselda Peaches, MD [facility] Radiology Electronically Signed   By: Jacqulynn Cadet M.D.   On: 08/09/2015 07:45    Labs:  CBC:  Recent Labs  08/06/15 1428 08/06/15 1429 08/07/15 0515  WBC  --  5.0 4.5  HGB 13.6 11.0* 10.3*  HCT 40.0 31.8* 29.8*  PLT  --  411* 376    COAGS:  Recent Labs  08/06/15 1429  INR 0.95  APTT 27    BMP:  Recent Labs  08/06/15 1429  08/07/15 0515 08/08/15 0510 08/09/15 0455  NA 134* 136 135 137  K 3.6 4.4 4.9 4.8  CL 97* 103 105 101  CO2 26 23 24 27   GLUCOSE 142* 90 106* 124*  BUN 8 8 6 8   CALCIUM 9.8 9.4 9.2 9.2  CREATININE 0.36* 0.38* 0.41* 0.54  GFRNONAA >60 >60 >60 >60  GFRAA >60 >60 >60 >60    LIVER FUNCTION TESTS:  Recent Labs  08/06/15 1429 08/07/15 0515 08/08/15 0510 08/09/15 0455  BILITOT 16.6* 15.5* 13.8* 11.5*  AST 273* 249* 195* 166*  ALT 361* 324* 282* 264*  ALKPHOS 862* 688* 631* 633*  PROT 7.5 6.3* 5.8* 5.9*  ALBUMIN 4.2 3.5 3.1* 3.4*    Assessment and Plan: Status post placement of 10 French internal/external biliary drain, and transhepatic alligator forceps biopsy of distal common bile duct occlusion on 12/23; currently afebrile, creatinine okay, LFTs slightly decreased, total bilirubin 11.5(13.8); per Dr. Laurence Ferrari maintain tube to external gravity drain bag until bilirubin has significantly trended down. Following this, patient can undergo a capping trial of the drain. Patient needs to monitor electrolytes and fluid balance carefully and replete as needed while external drain intact. Pathology results pending. Recommend return to IR in approximately 4 weeks for placement of an internal wall flex stent.   Signed: D. Rowe Robert 08/09/2015, 11:33 AM   I spent a total of 15 minutes at the the patient's bedside AND on the patient's hospital floor or unit, greater than 50% of which was counseling/coordinating care for biliary drain

## 2015-08-10 ENCOUNTER — Inpatient Hospital Stay (HOSPITAL_COMMUNITY): Payer: Medicare Other

## 2015-08-10 ENCOUNTER — Other Ambulatory Visit: Payer: Self-pay | Admitting: Radiology

## 2015-08-10 DIAGNOSIS — K831 Obstruction of bile duct: Secondary | ICD-10-CM

## 2015-08-10 LAB — COMPREHENSIVE METABOLIC PANEL
ALBUMIN: 3.2 g/dL — AB (ref 3.5–5.0)
ALK PHOS: 513 U/L — AB (ref 38–126)
ALT: 187 U/L — AB (ref 14–54)
AST: 82 U/L — ABNORMAL HIGH (ref 15–41)
Anion gap: 7 (ref 5–15)
BILIRUBIN TOTAL: 10.8 mg/dL — AB (ref 0.3–1.2)
BUN: 8 mg/dL (ref 6–20)
CALCIUM: 9.1 mg/dL (ref 8.9–10.3)
CO2: 27 mmol/L (ref 22–32)
CREATININE: 0.39 mg/dL — AB (ref 0.44–1.00)
Chloride: 96 mmol/L — ABNORMAL LOW (ref 101–111)
GFR calc Af Amer: >60 mL/min (ref >60)
GFR calc non Af Amer: >60 mL/min (ref >60)
GLUCOSE: 101 mg/dL — AB (ref 65–99)
Potassium: 4.1 mmol/L (ref 3.5–5.1)
SODIUM: 130 mmol/L — AB (ref 135–145)
TOTAL PROTEIN: 6 g/dL — AB (ref 6.5–8.1)

## 2015-08-10 LAB — CBC
HCT: 26.8 % — ABNORMAL LOW (ref 36.0–46.0)
Hemoglobin: 9.1 g/dL — ABNORMAL LOW (ref 12.0–15.0)
MCH: 32.3 pg (ref 26.0–34.0)
MCHC: 34 g/dL (ref 30.0–36.0)
MCV: 95 fL (ref 78.0–100.0)
PLATELETS: 263 10*3/uL (ref 150–400)
RBC: 2.82 MIL/uL — ABNORMAL LOW (ref 3.87–5.11)
RDW: 15.4 % (ref 11.5–15.5)
WBC: 9.9 10*3/uL (ref 4.0–10.5)

## 2015-08-10 LAB — GLUCOSE, CAPILLARY: Glucose-Capillary: 95 mg/dL (ref 65–99)

## 2015-08-10 MED ORDER — IOHEXOL 300 MG/ML  SOLN
25.0000 mL | Freq: Once | INTRAMUSCULAR | Status: AC | PRN
  Administered 2015-08-10: 25 mL

## 2015-08-10 NOTE — Progress Notes (Addendum)
[facility] PROGRESS NOTE  MIRAH NEVINS ZOX:[id number] DOB: 12-21-1929 DOA: 08/06/2015 PCP: [name], MD  HPI/Brief narrative 81 y.o. female with PMH of hypothyroidism, osteoarthritis, and mitral valve prolapse who presents to the ED at the direction of her primary care physician, Dr. Joylene Draft, for evaluation of painless jaundice.  Assessment/Plan: 1. Painless jaundice, biliary obstruction  - CT abdomen demonstrates intra- and extrahepatic biliary dilatation with abrupt cut off with her CBD enters head of pancreas with concerns for cancer - [facility] was consulted from the emergency department. -Patient underwent EUS and ERCP on 08/08/2015 demonstrating a tight 1 cm distal CBD stricture that was not able to be passed with wire. -Interventional radiology was consulted for PTC of biliary decompression and stricture brushing which was performed on 08/08/2015 -Patient remained stable overnight with a gradual trend down of her liver function -on 12/23, pt's biliary drain output stopped and was not able to be flushed. Discussed case with IR and pt subsequently underwent cholangiogram with drain exchange on 12/24  2. Normocytic anemia  - Mild, normocytic, and not noted on prior labs  3. Hypokalemia  - Continue to replace as needed -Normalized  4. Hyperglycemia - Patient is not a known diabetic -Likely transient - A1c 4.8  5. Hypertension  - There is a modest elevation in blood pressure since admission - Will continue home dose lisinopril and Norvasc - Continue to monitor, adjust regimen as needed  6. Hypothyroidism  - Continue home dose Synthroid, with monitoring  7. Weight loss, underweight with severe protein calorie malnutrition - BMI less than 18.5, patient complains of loss of appetite and recent 5-10 pound weight loss -Nutrition was consulted  Code Status: Full Family Communication: Pt in room Disposition Plan: D/c home  today   Consultants:  GI  Procedures:    Antibiotics: Anti-infectives    Start     Dose/Rate Route Frequency Ordered Stop   08/08/15 0915  ampicillin-sulbactam (UNASYN) 1.5 g in sodium chloride 0.9 % 50 mL IVPB     1.5 g 100 mL/hr over 30 Minutes Intravenous  Once 08/08/15 0910 08/08/15 1244      HPI/Subjective: Feels much better after procedure. Eager to go home  Objective: Filed Vitals:   08/09/15 1300 08/09/15 1839 08/10/15 0511 08/10/15 0745  BP: 140/66 122/77 134/68 138/92  Pulse: 81 73 78 95  Temp: 97.7 F (36.5 C) 98.1 F (36.7 C) 98.3 F (36.8 C) 97.5 F (36.4 C)  TempSrc: Oral  Oral Oral  Resp: '20 20 18 19  '$ Height:      Weight:      SpO2: 98% 98% 98% 97%    Intake/Output Summary (Last 24 hours) at 08/10/15 1020 Last data filed at 08/09/15 1616  Gross per 24 hour  Intake    360 ml  Output    900 ml  Net   -540 ml   Filed Weights   08/06/15 2018 08/08/15 0909  Weight: 42.5 kg (93 lb 11.1 oz) 42.185 kg (93 lb)    Exam:   General:  Awake, in nad ambulating in room  Cardiovascular: regular, s1, s2  Respiratory: normal resp effort, no wheezing  Abdomen: soft,nondistended, nontender  Musculoskeletal: perfused, no clubbing   Data Reviewed: Basic Metabolic Panel:  Recent Labs Lab 08/07/15 0515 08/08/15 0510 08/09/15 0455 08/09/15 1345 08/10/15 0526  NA 136 135 137 136 130*  K 4.4 4.9 4.8 4.4 4.1  CL 103 105 101 100* 96*  CO2 '23 24 27 28 '$ 27  GLUCOSE 90 106* 124* 115* 101*  BUN '8 6 8 8 8  '$ CREATININE 0.38* 0.41* 0.54 0.43* 0.39*  CALCIUM 9.4 9.2 9.2 9.4 9.1   Liver Function Tests:  Recent Labs Lab 08/07/15 0515 08/08/15 0510 08/09/15 0455 08/09/15 1345 08/10/15 0526  AST 249* 195* 166* 140* 82*  ALT 324* 282* 264* 256* 187*  ALKPHOS 688* 631* 633* 624* 513*  BILITOT 15.5* 13.8* 11.5* 11.0* 10.8*  PROT 6.3* 5.8* 5.9* 6.7 6.0*  ALBUMIN 3.5 3.1* 3.4* 3.6 3.2*    Recent Labs Lab 08/06/15 1429  LIPASE 55*   No results  for input(s): AMMONIA in the last 168 hours. CBC:  Recent Labs Lab 08/06/15 1428 08/06/15 1429 08/07/15 0515 08/09/15 2015 08/10/15 0526  WBC  --  5.0 4.5 11.6* 9.9  NEUTROABS  --  3.8  --   --   --   HGB 13.6 11.0* 10.3* 10.2* 9.1*  HCT 40.0 31.8* 29.8* 29.5* 26.8*  MCV  --  93.8 94.3 94.9 95.0  PLT  --  411* 376 351 263   Cardiac Enzymes: No results for input(s): CKTOTAL, CKMB, CKMBINDEX, TROPONINI in the last 168 hours. BNP (last 3 results) No results for input(s): BNP in the last 8760 hours.  ProBNP (last 3 results) No results for input(s): PROBNP in the last 8760 hours.  CBG:  Recent Labs Lab 08/07/15 0738 08/08/15 0746 08/09/15 0753 08/10/15 0738  GLUCAP 89 92 91 95    No results found for this or any previous visit (from the past 240 hour(s)).   Studies: Ct Abdomen Pelvis W Contrast  08/09/2015  CLINICAL DATA:  Biliary drainage catheter placement today with bleeding in Uresil bag and decreased biliary output. EXAM: CT ABDOMEN AND PELVIS WITH CONTRAST TECHNIQUE: Multidetector CT imaging of the abdomen and pelvis was performed using the standard protocol following bolus administration of intravenous contrast. CONTRAST:  176m OMNIPAQUE IOHEXOL 300 MG/ML  SOLN COMPARISON:  CT 08/06/2015. FINDINGS: Lower chest: Mild atelectasis at the right lung base. No significant pleural or pericardial effusion. Stable small hiatal hernia. Hepatobiliary: Percutaneous transhepatic biliary tube appears well positioned. This enters via the left hepatic lobe and terminates within the second portion of the duodenum. Biliary dilatation has mildly improved. There is intrahepatic pneumobilia. There is no focal fluid collection along the biliary tube. There is contrast material and air within the gallbladder lumen. No focal hepatic abnormalities are identified. Pancreas: Unremarkable. No pancreatic ductal dilatation or surrounding inflammatory changes. Spleen: Normal in size without focal  abnormality. Adrenals/Urinary Tract: Both adrenal glands appear normal. The kidneys appear stable without suspicious findings. There is a tiny nonobstructing calculus in the upper pole of the right kidney and probable small bilateral renal cysts. No hydronephrosis. The bladder appears unremarkable. Stomach/Bowel: No evidence of bowel wall thickening, distention or surrounding inflammatory change. Prominent liquid stool in the cecum. Vascular/Lymphatic: There are no enlarged abdominal or pelvic lymph nodes. Atherosclerosis of the aorta, its branches and the iliac arteries again noted. Reproductive: Unremarkable. Other: There is no ascites or free intraperitoneal air. Musculoskeletal: No acute or significant osseous findings. Stable degenerative changes within the spine associated with a convex left scoliosis. IMPRESSION: 1. No demonstrated complication following percutaneous biliary tube placement. The tube appears well positioned. Biliary dilatation has mildly improved and there is pneumobilia. 2. No new findings identified. 3. Atherosclerosis. Electronically Signed   By: WRichardean SaleM.D.   On: 08/09/2015 21:57   Dg Ercp  08/08/2015  CLINICAL DATA:  Distal biliary obstruction  EXAM: ERCP attempted, failed endoscopic stent placement TECHNIQUE: Multiple spot images obtained with the fluoroscopic device and submitted for interpretation post-procedure. FLUOROSCOPY TIME:  Radiation Exposure Index (as provided by the fluoroscopic device): 19.9 mGy If the device does not provide the exposure index: Fluoroscopy Time:  4 minutes 31 seconds Number of Acquired Images:  2 COMPARISON:  08/06/2015 FINDINGS: Limited opacification of the distal CBD over a short segment. Distal CBD obstruction/severe stricture suspected. IMPRESSION: Incomplete cholangiogram.  Distal CBD obstruction/severe stricture. These images were submitted for radiologic interpretation only. Please see the procedural report for the amount of contrast and  the fluoroscopy time utilized. Electronically Signed   By: Jerilynn Mages.  Shick M.D.   On: 08/08/2015 15:04   Dg Abd Portable 1v  08/09/2015  CLINICAL DATA:  Abdominal pain, assess drainage catheter position EXAM: PORTABLE ABDOMEN - 1 VIEW COMPARISON:  08/08/2015 FINDINGS: There is normal small bowel gas pattern. Again noted a percutaneous abdominal catheter. Tip of the catheter is about 3 cm right lateral to L4 vertebral body in right abdomen stable in position from prior exam. Abundant stool noted in right colon. IMPRESSION: Percutaneous abdominal catheter without change in position from prior exam. Please see image 12 from previous study. Electronically Signed   By: Lahoma Crocker M.D.   On: 08/09/2015 19:20   Ir Int Lianne Cure Biliary Drain With Cholangiogram  08/09/2015  CLINICAL DATA:  81 year old female with obstructed jaundice and severe hyperbilirubinemia. ERCP was unsuccessful secondary to high-grade stenosis in the distal common bile duct. Patient presents for percutaneous transhepatic cholangiography with possible biliary duct biopsy and placement of a biliary drain EXAM: IR INT-EXT BILIARY DRAIN W/ CHOLANGIOGRAM; IR ENDOLUMINAL BIOPSY OF BILIARY TREE Date: 08/09/2015 PROCEDURE: 1. Ultrasound-guided puncture of a peripheral left biliary duct 2. Percutaneous transhepatic cholangiogram 3. Successful crossing of distal common bile duct occlusion 4. Alligator forceps biopsy of distal common bile duct 5. Placement of an internal/external biliary drainage catheter Interventional Radiologist:  Criselda Peaches, MD ANESTHESIA/SEDATION: Moderate (conscious) sedation was used. 4 mg Versed, 100 mcg Fentanyl were administered intravenously. The patient's vital signs were monitored continuously by radiology nursing throughout the procedure. Sedation Time: 39 minutes MEDICATIONS: Patient Unasyn intravenously prior to the attempted ERCP. No additional antibiotics were administered. FLUOROSCOPY TIME:  13 minutes 6 seconds for a  total of 126 mGy CONTRAST:  11m OMNIPAQUE IOHEXOL 300 MG/ML  SOLN TECHNIQUE: Informed consent was obtained from the patient following explanation of the procedure, risks, benefits and alternatives. The patient understands, agrees and consents for the procedure. All questions were addressed. A time out was performed. Maximal barrier sterile technique utilized including caps, mask, sterile gowns, sterile gloves, large sterile drape, hand hygiene, and Betadine skin prep. The upper abdomen was interrogated with ultrasound. There is a very ductal dilatation is visible by sonography. Local anesthesia was attained by infiltration with 1% lidocaine. A small dermatotomy was made. Under real-time sonographic guidance, a peripheral branch of the left segment 3 biliary tree was punctured with a 2Lamontneedle. Puncture was confirmed by return of bile from the needle as well as a gentle hand injection of contrast material under fluoroscopy. A night tracks wire was then carefully advanced into the biliary tree and coiled within the common bile duct. The needle was then exchanged for the Accustick sheath. Through the Accustick sheath a percutaneous transhepatic cholangiogram was performed. There is significant intra and extrahepatic biliary ductal dilatation. There is complete smooth occlusion of the distal common bile duct. No significant irregularity  or evidence of choledocholithiasis. The cystic duct is patent. There is opacifications of the gallbladder lumen. Using a 4 [place] glide catheter and hydrophilic wire coaxially through the Accustick sheath, the wire and catheter were successfully navigated through the occlusion into the duodenum. Of note, the occlusion is very tight and crossing the lesion was difficult. A superstiff Amplatz wire was advanced through the Accustick sheath and coiled in the duodenum. The skin tract was then dilated and a 7 [place] cook flexor sheath advanced over the wire and positioned at the  distal common bile duct. The Cook flexible alligator forceps were then advanced coaxially through the sheath next to the safety wire and several biopsy specimens were obtained from the distal common bile duct occlusion. The specimens were placed in formalin and sent to pathology for further analysis. The skin tract was further dilated and a [place] biliary drainage catheter was advanced over the wire and formed with the locking loop in the duodenum. The proximal side hole is in the left segment 3 ductal system. The catheter was connected to gravity bag drainage and secured to the skin with 0 Prolene suture. The patient tolerated the procedure well. COMPLICATIONS: None Estimated blood loss:  0 mL IMPRESSION: 1. Percutaneous transhepatic cholangiogram demonstrates complete occlusion of the distal common bile duct. The occlusion appears smooth and is not particularly irregular. Differential considerations include both benign and malignant stricture. 2. Difficulty but ultimately successful crossing of the distal common bile duct occlusion. 3. Transhepatic alligator forceps biopsy of the distal common bile duct occlusion. 4. Placement of a 10 French internal/external biliary drainage catheter. PLAN: 1. Maintain tube to external gravity drainage until bilirubin has significantly trended down. At that point, since this is an internal/external drainage catheter the tube can be capped. 2. Monitor electrolytes and fluid balance and hydrate and replete electrolytes as needed while externally draining. 3. Pathology results are pending. 4. Recommend return to interventional radiology in approximately 4 weeks 4 placement of an internal WalFlex stent. Signed, Criselda Peaches, MD [facility] Radiology Electronically Signed   By: Jacqulynn Cadet M.D.   On: 08/09/2015 07:45   Ir Endoluminal Bx Of Biliary Tree  08/09/2015  CLINICAL DATA:  81 year old female with  obstructed jaundice and severe hyperbilirubinemia. ERCP was unsuccessful secondary to high-grade stenosis in the distal common bile duct. Patient presents for percutaneous transhepatic cholangiography with possible biliary duct biopsy and placement of a biliary drain EXAM: IR INT-EXT BILIARY DRAIN W/ CHOLANGIOGRAM; IR ENDOLUMINAL BIOPSY OF BILIARY TREE Date: 08/09/2015 PROCEDURE: 1. Ultrasound-guided puncture of a peripheral left biliary duct 2. Percutaneous transhepatic cholangiogram 3. Successful crossing of distal common bile duct occlusion 4. Alligator forceps biopsy of distal common bile duct 5. Placement of an internal/external biliary drainage catheter Interventional Radiologist:  Criselda Peaches, MD ANESTHESIA/SEDATION: Moderate (conscious) sedation was used. 4 mg Versed, 100 mcg Fentanyl were administered intravenously. The patient's vital signs were monitored continuously by radiology nursing throughout the procedure. Sedation Time: 39 minutes MEDICATIONS: Patient Unasyn intravenously prior to the attempted ERCP. No additional antibiotics were administered. FLUOROSCOPY TIME:  13 minutes 6 seconds for a total of 126 mGy CONTRAST:  13m OMNIPAQUE IOHEXOL 300 MG/ML  SOLN TECHNIQUE: Informed consent was obtained from the patient following explanation of the procedure, risks, benefits and alternatives. The patient understands, agrees and consents for the procedure. All questions were addressed. A time out was performed. Maximal barrier sterile technique utilized including caps, mask, sterile gowns, sterile gloves,  large sterile drape, hand hygiene, and Betadine skin prep. The upper abdomen was interrogated with ultrasound. There is a very ductal dilatation is visible by sonography. Local anesthesia was attained by infiltration with 1% lidocaine. A small dermatotomy was made. Under real-time sonographic guidance, a peripheral branch of the left segment 3 biliary tree was punctured with a Gabbs  needle. Puncture was confirmed by return of bile from the needle as well as a gentle hand injection of contrast material under fluoroscopy. A night tracks wire was then carefully advanced into the biliary tree and coiled within the common bile duct. The needle was then exchanged for the Accustick sheath. Through the Accustick sheath a percutaneous transhepatic cholangiogram was performed. There is significant intra and extrahepatic biliary ductal dilatation. There is complete smooth occlusion of the distal common bile duct. No significant irregularity or evidence of choledocholithiasis. The cystic duct is patent. There is opacifications of the gallbladder lumen. Using a 4 [place] glide catheter and hydrophilic wire coaxially through the Accustick sheath, the wire and catheter were successfully navigated through the occlusion into the duodenum. Of note, the occlusion is very tight and crossing the lesion was difficult. A superstiff Amplatz wire was advanced through the Accustick sheath and coiled in the duodenum. The skin tract was then dilated and a 7 [place] cook flexor sheath advanced over the wire and positioned at the distal common bile duct. The Cook flexible alligator forceps were then advanced coaxially through the sheath next to the safety wire and several biopsy specimens were obtained from the distal common bile duct occlusion. The specimens were placed in formalin and sent to pathology for further analysis. The skin tract was further dilated and a [place] biliary drainage catheter was advanced over the wire and formed with the locking loop in the duodenum. The proximal side hole is in the left segment 3 ductal system. The catheter was connected to gravity bag drainage and secured to the skin with 0 Prolene suture. The patient tolerated the procedure well. COMPLICATIONS: None Estimated blood loss:  0 mL IMPRESSION: 1. Percutaneous transhepatic cholangiogram demonstrates complete occlusion of the  distal common bile duct. The occlusion appears smooth and is not particularly irregular. Differential considerations include both benign and malignant stricture. 2. Difficulty but ultimately successful crossing of the distal common bile duct occlusion. 3. Transhepatic alligator forceps biopsy of the distal common bile duct occlusion. 4. Placement of a 10 French internal/external biliary drainage catheter. PLAN: 1. Maintain tube to external gravity drainage until bilirubin has significantly trended down. At that point, since this is an internal/external drainage catheter the tube can be capped. 2. Monitor electrolytes and fluid balance and hydrate and replete electrolytes as needed while externally draining. 3. Pathology results are pending. 4. Recommend return to interventional radiology in approximately 4 weeks 4 placement of an internal WalFlex stent. Signed, Criselda Peaches, MD [facility] Radiology Electronically Signed   By: Jacqulynn Cadet M.D.   On: 08/09/2015 07:45    Scheduled Meds: . amLODipine  10 mg Oral Daily  . heparin  5,000 Units Subcutaneous 3 times per day  . levothyroxine  25 mcg Oral QAC breakfast  . lisinopril  5 mg Oral Daily  . multivitamin with minerals  1 tablet Oral Daily   Continuous Infusions:    Principal Problem:   Biliary obstruction Active Problems:   Liver failure, acute   Anemia   Hypokalemia   Hyperglycemia   Common biliary duct  obstruction   Jaundice   Summer Jones, [facility] Pager [phone]. If 7PM-7AM, please contact night-coverage at [email or web address], password [facility] 08/10/2015, 10:20 AM  LOS: 4 days

## 2015-08-10 NOTE — Procedures (Signed)
Interventional Radiology Procedure Note  Procedure:  1.) Cholangiogram through existing tube 2.) Mechanical thrombectomy and unclogging of existing tube  Complications: None  Estimated Blood Loss: 0 mL  Recommendations: - Tube to gravity drainage - May DC home as previously planned - Pt to call IR doctor on call ([phone]) if problems recur   Signed,  Criselda Peaches, MD

## 2015-08-10 NOTE — Progress Notes (Signed)
Patient ID: Summer Jones, female   DOB: July 10, 1930, 81 y.o.   MRN: [id number]          Subjective: Pt still having some abd discomfort, primarily at drain site; had some N/V last night with onset of hemobilia; constipated; f/u CT without acute changes   Allergies: Review of patient's allergies indicates no known allergies.  Medications: Prior to Admission medications   Medication Sig Start Date End Date Taking? Authorizing Provider  amLODipine (NORVASC) 10 MG tablet  06/16/15  Yes Historical Provider, MD  levothyroxine (SYNTHROID, LEVOTHROID) 25 MCG tablet Take 25 mcg by mouth daily before breakfast.   Yes Historical Provider, MD  lisinopril (PRINIVIL,ZESTRIL) 5 MG tablet Take 5 mg by mouth daily.   Yes Historical Provider, MD  Multiple Vitamins-Minerals (MULTIVITAMIN GUMMIES ADULT) CHEW Chew 1 tablet by mouth daily.   Yes Historical Provider, MD  oxyCODONE (OXY IR/ROXICODONE) 5 MG immediate release tablet Take 1 tablet (5 mg total) by mouth every 4 (four) hours as needed for severe pain. 08/09/15   Donne Hazel, MD     Vital Signs: BP 138/92 mmHg  Pulse 95  Temp(Src) 97.5 F (36.4 C) (Oral)  Resp 19  Ht 5' (1.524 m)  Wt 93 lb (42.185 kg)  BMI 18.16 kg/m2  SpO2 97%  Physical Exam awake/alert; chest- CTA bilat; heart- RRR; abd- soft, mildly dist; biliary drain intact, insertion site ok, mild-mod tender, small amt bloody bile in bag; drain flushed gently with 5 cc sterile NS with return of bloody bile  Imaging: Ct Abdomen Pelvis W Contrast  08/09/2015  CLINICAL DATA:  Biliary drainage catheter placement today with bleeding in Uresil bag and decreased biliary output. EXAM: CT ABDOMEN AND PELVIS WITH CONTRAST TECHNIQUE: Multidetector CT imaging of the abdomen and pelvis was performed using the standard protocol following bolus administration of intravenous contrast. CONTRAST:  142mL OMNIPAQUE IOHEXOL 300 MG/ML  SOLN COMPARISON:  CT 08/06/2015. FINDINGS: Lower chest: Mild  atelectasis at the right lung base. No significant pleural or pericardial effusion. Stable small hiatal hernia. Hepatobiliary: Percutaneous transhepatic biliary tube appears well positioned. This enters via the left hepatic lobe and terminates within the second portion of the duodenum. Biliary dilatation has mildly improved. There is intrahepatic pneumobilia. There is no focal fluid collection along the biliary tube. There is contrast material and air within the gallbladder lumen. No focal hepatic abnormalities are identified. Pancreas: Unremarkable. No pancreatic ductal dilatation or surrounding inflammatory changes. Spleen: Normal in size without focal abnormality. Adrenals/Urinary Tract: Both adrenal glands appear normal. The kidneys appear stable without suspicious findings. There is a tiny nonobstructing calculus in the upper pole of the right kidney and probable small bilateral renal cysts. No hydronephrosis. The bladder appears unremarkable. Stomach/Bowel: No evidence of bowel wall thickening, distention or surrounding inflammatory change. Prominent liquid stool in the cecum. Vascular/Lymphatic: There are no enlarged abdominal or pelvic lymph nodes. Atherosclerosis of the aorta, its branches and the iliac arteries again noted. Reproductive: Unremarkable. Other: There is no ascites or free intraperitoneal air. Musculoskeletal: No acute or significant osseous findings. Stable degenerative changes within the spine associated with a convex left scoliosis. IMPRESSION: 1. No demonstrated complication following percutaneous biliary tube placement. The tube appears well positioned. Biliary dilatation has mildly improved and there is pneumobilia. 2. No new findings identified. 3. Atherosclerosis. Electronically Signed   By: Richardean Sale M.D.   On: 08/09/2015 21:57   Ct Abdomen Pelvis W Contrast  08/06/2015  CLINICAL DATA:  Increasing fatigue since October.  Yesterday patient becmae jaundiced. Elevated liver  function studies. EXAM: CT ABDOMEN AND PELVIS WITH CONTRAST TECHNIQUE: Multidetector CT imaging of the abdomen and pelvis was performed using the standard protocol following bolus administration of intravenous contrast. CONTRAST:  190mL OMNIPAQUE IOHEXOL 300 MG/ML  SOLN COMPARISON:  CT scan 06/19/2013 FINDINGS: Lower chest: The lung bases are clear of acute process. No pleural effusion or pulmonary nodules. The heart is normal in size for age. No pericardial effusion. The small hiatal hernia is noted. Hepatobiliary: Significant intrahepatic biliary dilatation. The common hepatic duct is also dilated and there is an abrupt cut off of the common bile duct as it enters the pancreatic head area. Findings could be due to a common bile duct stone, biliary tumor (cholangiocarcinoma) or possibly a pancreatic lesion. I believe a biliary tumor is most likely. The pancreatic duct is normal and I do not see an obvious pancreatic mass. MRI abdomen/ MRCP may be helpful for further evaluation if the patient can hold her breath adequately. Otherwise, ERCP may be helpful for both diagnostic and therapeutic purposes if the obstruction can be crossed. No focal hepatic lesions are identified. The portal and hepatic veins are patent. Pancreas: No definite pancreatic mass or inflammation. The pancreatic duct is normal in caliber and has a normal course. Spleen: Normal size.  No focal lesions. Adrenals/Urinary Tract: The adrenal glands and kidneys are normal. Stomach/Bowel: The stomach, duodenum, small bowel and colon are unremarkable. No inflammatory changes, mass lesions or obstructive findings. Diffuse colonic diverticulosis but no findings for acute diverticulitis. The terminal ileum is normal. The appendix is normal. Vascular/Lymphatic: No mesenteric or retroperitoneal mass or adenopathy. Specifically, I do not see any portal or periportal mass or adenopathy. Scattered atherosclerotic calcifications involving the aorta but fairly  minimal for age. The major venous structures are patent. Reproductive: The uterus and ovaries are normal. Other: The bladder is normal. No pelvic mass, adenopathy or free pelvic fluid collections. No inguinal mass or adenopathy. Musculoskeletal: Left convex thoracolumbar scoliosis and associated degenerative lumbar spondylosis. No acute bony findings or destructive bony changes. IMPRESSION: 1. Intra and extrahepatic biliary dilatation with an abrupt cut off of the common bile duct near its entry into the pancreatic head. Findings most suspicious for a biliary tumor (cholangiocarcinoma) but a common bile duct stone and pancreatic lesion are also possible. MRCP may be helpful for further evaluation if the patient can hold her breath. Otherwise, ERCP is suggested for both diagnostic and therapeutic purposes. 2. Moderate distention of the gallbladder without obvious gallstones. 3. No liver lesions or abdominal lymphadenopathy. Electronically Signed   By: Marijo Sanes M.D.   On: 08/06/2015 17:34   Dg Ercp  08/08/2015  CLINICAL DATA:  Distal biliary obstruction EXAM: ERCP attempted, failed endoscopic stent placement TECHNIQUE: Multiple spot images obtained with the fluoroscopic device and submitted for interpretation post-procedure. FLUOROSCOPY TIME:  Radiation Exposure Index (as provided by the fluoroscopic device): 19.9 mGy If the device does not provide the exposure index: Fluoroscopy Time:  4 minutes 31 seconds Number of Acquired Images:  2 COMPARISON:  08/06/2015 FINDINGS: Limited opacification of the distal CBD over a short segment. Distal CBD obstruction/severe stricture suspected. IMPRESSION: Incomplete cholangiogram.  Distal CBD obstruction/severe stricture. These images were submitted for radiologic interpretation only. Please see the procedural report for the amount of contrast and the fluoroscopy time utilized. Electronically Signed   By: Jerilynn Mages.  Shick M.D.   On: 08/08/2015 15:04   Dg Abd Portable  1v  08/09/2015  CLINICAL DATA:  Abdominal pain, assess drainage catheter position EXAM: PORTABLE ABDOMEN - 1 VIEW COMPARISON:  08/08/2015 FINDINGS: There is normal small bowel gas pattern. Again noted a percutaneous abdominal catheter. Tip of the catheter is about 3 cm right lateral to L4 vertebral body in right abdomen stable in position from prior exam. Abundant stool noted in right colon. IMPRESSION: Percutaneous abdominal catheter without change in position from prior exam. Please see image 12 from previous study. Electronically Signed   By: Lahoma Crocker M.D.   On: 08/09/2015 19:20   Ir Int Lianne Cure Biliary Drain With Cholangiogram  08/09/2015  CLINICAL DATA:  81 year old female with obstructed jaundice and severe hyperbilirubinemia. ERCP was unsuccessful secondary to high-grade stenosis in the distal common bile duct. Patient presents for percutaneous transhepatic cholangiography with possible biliary duct biopsy and placement of a biliary drain EXAM: IR INT-EXT BILIARY DRAIN W/ CHOLANGIOGRAM; IR ENDOLUMINAL BIOPSY OF BILIARY TREE Date: 08/09/2015 PROCEDURE: 1. Ultrasound-guided puncture of a peripheral left biliary duct 2. Percutaneous transhepatic cholangiogram 3. Successful crossing of distal common bile duct occlusion 4. Alligator forceps biopsy of distal common bile duct 5. Placement of an internal/external biliary drainage catheter Interventional Radiologist:  Criselda Peaches, MD ANESTHESIA/SEDATION: Moderate (conscious) sedation was used. 4 mg Versed, 100 mcg Fentanyl were administered intravenously. The patient's vital signs were monitored continuously by radiology nursing throughout the procedure. Sedation Time: 39 minutes MEDICATIONS: Patient Unasyn intravenously prior to the attempted ERCP. No additional antibiotics were administered. FLUOROSCOPY TIME:  13 minutes 6 seconds for a total of 126 mGy CONTRAST:  46mL OMNIPAQUE IOHEXOL 300 MG/ML  SOLN TECHNIQUE: Informed consent was obtained from the  patient following explanation of the procedure, risks, benefits and alternatives. The patient understands, agrees and consents for the procedure. All questions were addressed. A time out was performed. Maximal barrier sterile technique utilized including caps, mask, sterile gowns, sterile gloves, large sterile drape, hand hygiene, and Betadine skin prep. The upper abdomen was interrogated with ultrasound. There is a very ductal dilatation is visible by sonography. Local anesthesia was attained by infiltration with 1% lidocaine. A small dermatotomy was made. Under real-time sonographic guidance, a peripheral branch of the left segment 3 biliary tree was punctured with a [place] needle. Puncture was confirmed by return of bile from the needle as well as a gentle hand injection of contrast material under fluoroscopy. A night tracks wire was then carefully advanced into the biliary tree and coiled within the common bile duct. The needle was then exchanged for the Accustick sheath. Through the Accustick sheath a percutaneous transhepatic cholangiogram was performed. There is significant intra and extrahepatic biliary ductal dilatation. There is complete smooth occlusion of the distal common bile duct. No significant irregularity or evidence of choledocholithiasis. The cystic duct is patent. There is opacifications of the gallbladder lumen. Using a 4 [place] glide catheter and hydrophilic wire coaxially through the Accustick sheath, the wire and catheter were successfully navigated through the occlusion into the duodenum. Of note, the occlusion is very tight and crossing the lesion was difficult. A superstiff Amplatz wire was advanced through the Accustick sheath and coiled in the duodenum. The skin tract was then dilated and a 7 [place] cook flexor sheath advanced over the wire and positioned at the distal common bile duct. The Cook flexible alligator forceps were then advanced coaxially through the sheath next to  the safety wire and several biopsy specimens were obtained from the distal common bile duct occlusion. The specimens were placed in formalin and  sent to pathology for further analysis. The skin tract was further dilated and a [place] biliary drainage catheter was advanced over the wire and formed with the locking loop in the duodenum. The proximal side hole is in the left segment 3 ductal system. The catheter was connected to gravity bag drainage and secured to the skin with 0 Prolene suture. The patient tolerated the procedure well. COMPLICATIONS: None Estimated blood loss:  0 mL IMPRESSION: 1. Percutaneous transhepatic cholangiogram demonstrates complete occlusion of the distal common bile duct. The occlusion appears smooth and is not particularly irregular. Differential considerations include both benign and malignant stricture. 2. Difficulty but ultimately successful crossing of the distal common bile duct occlusion. 3. Transhepatic alligator forceps biopsy of the distal common bile duct occlusion. 4. Placement of a 10 French internal/external biliary drainage catheter. PLAN: 1. Maintain tube to external gravity drainage until bilirubin has significantly trended down. At that point, since this is an internal/external drainage catheter the tube can be capped. 2. Monitor electrolytes and fluid balance and hydrate and replete electrolytes as needed while externally draining. 3. Pathology results are pending. 4. Recommend return to interventional radiology in approximately 4 weeks 4 placement of an internal WalFlex stent. Signed, Criselda Peaches, MD [facility] Electronically Signed   By: Jacqulynn Cadet M.D.   On: 08/09/2015 07:45   Ir Endoluminal Bx Of Biliary Tree  08/09/2015  CLINICAL DATA:  81 year old female with obstructed jaundice and severe hyperbilirubinemia. ERCP was unsuccessful secondary to high-grade stenosis in the distal  common bile duct. Patient presents for percutaneous transhepatic cholangiography with possible biliary duct biopsy and placement of a biliary drain EXAM: IR INT-EXT BILIARY DRAIN W/ CHOLANGIOGRAM; IR ENDOLUMINAL BIOPSY OF BILIARY TREE Date: 08/09/2015 PROCEDURE: 1. Ultrasound-guided puncture of a peripheral left biliary duct 2. Percutaneous transhepatic cholangiogram 3. Successful crossing of distal common bile duct occlusion 4. Alligator forceps biopsy of distal common bile duct 5. Placement of an internal/external biliary drainage catheter Interventional Radiologist:  Criselda Peaches, MD ANESTHESIA/SEDATION: Moderate (conscious) sedation was used. 4 mg Versed, 100 mcg Fentanyl were administered intravenously. The patient's vital signs were monitored continuously by radiology nursing throughout the procedure. Sedation Time: 39 minutes MEDICATIONS: Patient Unasyn intravenously prior to the attempted ERCP. No additional antibiotics were administered. FLUOROSCOPY TIME:  13 minutes 6 seconds for a total of 126 mGy CONTRAST:  68mL OMNIPAQUE IOHEXOL 300 MG/ML  SOLN TECHNIQUE: Informed consent was obtained from the patient following explanation of the procedure, risks, benefits and alternatives. The patient understands, agrees and consents for the procedure. All questions were addressed. A time out was performed. Maximal barrier sterile technique utilized including caps, mask, sterile gowns, sterile gloves, large sterile drape, hand hygiene, and Betadine skin prep. The upper abdomen was interrogated with ultrasound. There is a very ductal dilatation is visible by sonography. Local anesthesia was attained by infiltration with 1% lidocaine. A small dermatotomy was made. Under real-time sonographic guidance, a peripheral branch of the left segment 3 biliary tree was punctured with a [place] needle. Puncture was confirmed by return of bile from the needle as well as a gentle hand injection of contrast material  under fluoroscopy. A night tracks wire was then carefully advanced into the biliary tree and coiled within the common bile duct. The needle was then exchanged for the Accustick sheath. Through the Accustick sheath a percutaneous transhepatic cholangiogram was performed. There is significant intra and extrahepatic biliary ductal dilatation. There is  complete smooth occlusion of the distal common bile duct. No significant irregularity or evidence of choledocholithiasis. The cystic duct is patent. There is opacifications of the gallbladder lumen. Using a 4 [place] glide catheter and hydrophilic wire coaxially through the Accustick sheath, the wire and catheter were successfully navigated through the occlusion into the duodenum. Of note, the occlusion is very tight and crossing the lesion was difficult. A superstiff Amplatz wire was advanced through the Accustick sheath and coiled in the duodenum. The skin tract was then dilated and a 7 [place] cook flexor sheath advanced over the wire and positioned at the distal common bile duct. The Cook flexible alligator forceps were then advanced coaxially through the sheath next to the safety wire and several biopsy specimens were obtained from the distal common bile duct occlusion. The specimens were placed in formalin and sent to pathology for further analysis. The skin tract was further dilated and a [place] biliary drainage catheter was advanced over the wire and formed with the locking loop in the duodenum. The proximal side hole is in the left segment 3 ductal system. The catheter was connected to gravity bag drainage and secured to the skin with 0 Prolene suture. The patient tolerated the procedure well. COMPLICATIONS: None Estimated blood loss:  0 mL IMPRESSION: 1. Percutaneous transhepatic cholangiogram demonstrates complete occlusion of the distal common bile duct. The occlusion appears smooth and is not particularly irregular. Differential considerations include  both benign and malignant stricture. 2. Difficulty but ultimately successful crossing of the distal common bile duct occlusion. 3. Transhepatic alligator forceps biopsy of the distal common bile duct occlusion. 4. Placement of a 10 French internal/external biliary drainage catheter. PLAN: 1. Maintain tube to external gravity drainage until bilirubin has significantly trended down. At that point, since this is an internal/external drainage catheter the tube can be capped. 2. Monitor electrolytes and fluid balance and hydrate and replete electrolytes as needed while externally draining. 3. Pathology results are pending. 4. Recommend return to interventional radiology in approximately 4 weeks 4 placement of an internal WalFlex stent. Signed, Criselda Peaches, MD [facility] Electronically Signed   By: Jacqulynn Cadet M.D.   On: 08/09/2015 07:45    Labs:  CBC:  Recent Labs  08/06/15 1429 08/07/15 0515 08/09/15 2015 08/10/15 0526  WBC 5.0 4.5 11.6* 9.9  HGB 11.0* 10.3* 10.2* 9.1*  HCT 31.8* 29.8* 29.5* 26.8*  PLT 411* 376 351 263    COAGS:  Recent Labs  08/06/15 1429  INR 0.95  APTT 27    BMP:  Recent Labs  08/08/15 0510 08/09/15 0455 08/09/15 1345 08/10/15 0526  NA 135 137 136 130*  K 4.9 4.8 4.4 4.1  CL 105 101 100* 96*  CO2 24 27 28 27   GLUCOSE 106* 124* 115* 101*  BUN 6 8 8 8   CALCIUM 9.2 9.2 9.4 9.1  CREATININE 0.41* 0.54 0.43* 0.39*  GFRNONAA >60 >60 >60 >60  GFRAA >60 >60 >60 >60    LIVER FUNCTION TESTS:  Recent Labs  08/08/15 0510 08/09/15 0455 08/09/15 1345 08/10/15 0526  BILITOT 13.8* 11.5* 11.0* 10.8*  AST 195* 166* 140* 82*  ALT 282* 264* 256* 187*  ALKPHOS 631* 633* 624* 513*  PROT 5.8* 5.9* 6.7 6.0*  ALBUMIN 3.1* 3.4* 3.6 3.2*    Assessment and Plan: Status post placement of 10 French internal/external biliary drain, and transhepatic alligator forceps biopsy of distal common bile  duct occlusion on 12/23; currently  afebrile;  WBC nl ; hgb 9.1(10.2), plts 263k, NA 130, K 4.1, creat .39, t bili 10.8(11), LFT's still trending down; hemobilia starting last night with clots in drain tubing; SQ heparin held; f/u CT without acute changes, drain well positioned; drain also able to be flushed this am with return of bloody bile, most likely secondary to bleed from prior CBD bx site; will plan cholangiogram with drain exchange this am .Plans d/w pt/family with their understanding and consent.    Signed: D. Rowe Robert 08/10/2015, 8:47 AM   I spent a total of 15 minutes at the the patient's bedside AND on the patient's hospital floor or unit, greater than 50% of which was counseling/coordinating care for biliary drain

## 2015-08-10 NOTE — Progress Notes (Signed)
Went over d/c instructions with patient and family.  Patient and husband verbalized understanding.  Left hospital with all belongings via w/c and personal vehicle.  Virginia Rochester, RN

## 2015-08-10 NOTE — Care Management Note (Signed)
Case Management Note  Patient Details  Name: Summer Jones MRN: [id number] Date of Birth: 1930/03/05  Subjective/Objective:    Biliary obstruction                Action/Plan: HH arranged with [facility] . Contacted [facility] with dc home today with drain. [place] agency will follow up for visit 08/11/2015.   Expected Discharge Date:08/10/2015            Expected Discharge Plan:  [place]  In-House Referral:  NA  Discharge planning Services  CM Consult  Post Acute Care Choice:  Home Health Choice offered to:  Patient  HH Arranged:  RN [facility] Agency:  [place]  Status of Service:  Completed, signed off  Medicare Important Message Given:  yes Date Medicare IM Given:    Medicare IM give by:    Date Additional Medicare IM Given:    Additional Medicare Important Message give by:     If discussed at [place] of Stay Meetings, dates discussed:    Additional Comments:  Erenest Rasher, RN 08/10/2015, 11:58 AM

## 2015-08-13 ENCOUNTER — Encounter (HOSPITAL_COMMUNITY): Payer: Self-pay | Admitting: Emergency Medicine

## 2015-08-13 ENCOUNTER — Ambulatory Visit (INDEPENDENT_AMBULATORY_CARE_PROVIDER_SITE_OTHER): Payer: Medicare Other | Admitting: Gastroenterology

## 2015-08-13 ENCOUNTER — Other Ambulatory Visit (INDEPENDENT_AMBULATORY_CARE_PROVIDER_SITE_OTHER): Payer: Medicare Other

## 2015-08-13 ENCOUNTER — Inpatient Hospital Stay (HOSPITAL_COMMUNITY)
Admission: EM | Admit: 2015-08-13 | Discharge: 2015-08-16 | DRG: 682 | Disposition: A | Discharge status: Home Health | Payer: Medicare Other | Attending: Internal Medicine | Admitting: Internal Medicine

## 2015-08-13 ENCOUNTER — Encounter: Payer: Self-pay | Admitting: Gastroenterology

## 2015-08-13 VITALS — BP 70/46 | HR 76 | Ht 60.0 in | Wt 87.4 lb

## 2015-08-13 DIAGNOSIS — K838 Other specified diseases of biliary tract: Secondary | ICD-10-CM | POA: Diagnosis not present

## 2015-08-13 DIAGNOSIS — K5730 Diverticulosis of large intestine without perforation or abscess without bleeding: Secondary | ICD-10-CM | POA: Diagnosis present

## 2015-08-13 DIAGNOSIS — R531 Weakness: Secondary | ICD-10-CM | POA: Diagnosis not present

## 2015-08-13 DIAGNOSIS — E871 Hypo-osmolality and hyponatremia: Secondary | ICD-10-CM | POA: Diagnosis present

## 2015-08-13 DIAGNOSIS — Z803 Family history of malignant neoplasm of breast: Secondary | ICD-10-CM

## 2015-08-13 DIAGNOSIS — M1990 Unspecified osteoarthritis, unspecified site: Secondary | ICD-10-CM | POA: Diagnosis present

## 2015-08-13 DIAGNOSIS — K831 Obstruction of bile duct: Secondary | ICD-10-CM

## 2015-08-13 DIAGNOSIS — R627 Adult failure to thrive: Secondary | ICD-10-CM

## 2015-08-13 DIAGNOSIS — C801 Malignant (primary) neoplasm, unspecified: Secondary | ICD-10-CM | POA: Insufficient documentation

## 2015-08-13 DIAGNOSIS — E43 Unspecified severe protein-calorie malnutrition: Secondary | ICD-10-CM | POA: Diagnosis present

## 2015-08-13 DIAGNOSIS — E039 Hypothyroidism, unspecified: Secondary | ICD-10-CM | POA: Diagnosis present

## 2015-08-13 DIAGNOSIS — I341 Nonrheumatic mitral (valve) prolapse: Secondary | ICD-10-CM | POA: Diagnosis present

## 2015-08-13 DIAGNOSIS — Z882 Allergy status to sulfonamides status: Secondary | ICD-10-CM

## 2015-08-13 DIAGNOSIS — Z79899 Other long term (current) drug therapy: Secondary | ICD-10-CM

## 2015-08-13 DIAGNOSIS — Z681 Body mass index (BMI) 19 or less, adult: Secondary | ICD-10-CM

## 2015-08-13 DIAGNOSIS — Z8249 Family history of ischemic heart disease and other diseases of the circulatory system: Secondary | ICD-10-CM

## 2015-08-13 DIAGNOSIS — E860 Dehydration: Secondary | ICD-10-CM | POA: Diagnosis present

## 2015-08-13 DIAGNOSIS — I10 Essential (primary) hypertension: Secondary | ICD-10-CM | POA: Diagnosis present

## 2015-08-13 DIAGNOSIS — N179 Acute kidney failure, unspecified: Principal | ICD-10-CM | POA: Diagnosis present

## 2015-08-13 LAB — BASIC METABOLIC PANEL
Anion gap: 13 (ref 5–15)
Anion gap: 11 (ref 5–15)
BUN: 34 mg/dL — AB (ref 6–20)
BUN: 34 mg/dL — AB (ref 6–20)
CALCIUM: 8.6 mg/dL — AB (ref 8.9–10.3)
CALCIUM: 8.5 mg/dL — AB (ref 8.9–10.3)
CO2: 22 mmol/L (ref 22–32)
CO2: 26 mmol/L (ref 22–32)
CREATININE: 1.75 mg/dL — AB (ref 0.44–1.00)
CREATININE: 1.34 mg/dL — AB (ref 0.44–1.00)
Chloride: 87 mmol/L — ABNORMAL LOW (ref 101–111)
Chloride: 89 mmol/L — ABNORMAL LOW (ref 101–111)
GFR calc Af Amer: 30 mL/min — ABNORMAL LOW (ref >60)
GFR calc Af Amer: 41 mL/min — ABNORMAL LOW (ref >60)
GFR, EST NON AFRICAN AMERICAN: 26 mL/min — AB (ref >60)
GFR, EST NON AFRICAN AMERICAN: 36 mL/min — AB (ref >60)
GLUCOSE: 111 mg/dL — AB (ref 65–99)
GLUCOSE: 110 mg/dL — AB (ref 65–99)
POTASSIUM: 4.4 mmol/L (ref 3.5–5.1)
Potassium: 4 mmol/L (ref 3.5–5.1)
Sodium: 122 mmol/L — ABNORMAL LOW (ref 135–145)
Sodium: 126 mmol/L — ABNORMAL LOW (ref 135–145)

## 2015-08-13 LAB — CBC
HCT: 28.8 % — ABNORMAL LOW (ref 36.0–46.0)
HEMATOCRIT: 33.7 % — AB (ref 36.0–46.0)
Hemoglobin: 11.6 g/dL — ABNORMAL LOW (ref 12.0–15.0)
Hemoglobin: 9.9 g/dL — ABNORMAL LOW (ref 12.0–15.0)
MCH: 32.7 pg (ref 26.0–34.0)
MCH: 31.9 pg (ref 26.0–34.0)
MCHC: 34.4 g/dL (ref 30.0–36.0)
MCHC: 34.4 g/dL (ref 30.0–36.0)
MCV: 94.9 fL (ref 78.0–100.0)
MCV: 92.9 fL (ref 78.0–100.0)
PLATELETS: 376 10*3/uL (ref 150–400)
PLATELETS: 361 10*3/uL (ref 150–400)
RBC: 3.55 MIL/uL — ABNORMAL LOW (ref 3.87–5.11)
RBC: 3.1 MIL/uL — ABNORMAL LOW (ref 3.87–5.11)
RDW: 14.9 % (ref 11.5–15.5)
RDW: 14.6 % (ref 11.5–15.5)
WBC: 8 10*3/uL (ref 4.0–10.5)
WBC: 7.6 10*3/uL (ref 4.0–10.5)

## 2015-08-13 LAB — COMPREHENSIVE METABOLIC PANEL
ALT: 123 U/L — AB (ref 0–35)
AST: 58 U/L — AB (ref 0–37)
Albumin: 4.1 g/dL (ref 3.5–5.2)
Alkaline Phosphatase: 513 U/L — ABNORMAL HIGH (ref 39–117)
BILIRUBIN TOTAL: 10.9 mg/dL — AB (ref 0.2–1.2)
BUN: 36 mg/dL — AB (ref 6–23)
CO2: 24 meq/L (ref 19–32)
CREATININE: 2.07 mg/dL — AB (ref 0.40–1.20)
Calcium: 9.7 mg/dL (ref 8.4–10.5)
Chloride: 80 mEq/L — ABNORMAL LOW (ref 96–112)
GFR: 24.29 mL/min — AB (ref >60.00)
GLUCOSE: 173 mg/dL — AB (ref 70–99)
Potassium: 4.4 mEq/L (ref 3.5–5.1)
SODIUM: 120 meq/L — AB (ref 135–145)
TOTAL PROTEIN: 7.3 g/dL (ref 6.0–8.3)

## 2015-08-13 MED ORDER — HEPARIN SODIUM (PORCINE) 5000 UNIT/ML IJ SOLN
5000.0000 [IU] | Freq: Three times a day (TID) | INTRAMUSCULAR | Status: DC
  Filled 2015-08-13: qty 1

## 2015-08-13 MED ORDER — MULTIVITAMIN GUMMIES ADULT PO CHEW: 1.0000 | CHEWABLE_TABLET | Freq: Every day | ORAL | Status: DC

## 2015-08-13 MED ORDER — ADULT MULTIVITAMIN W/MINERALS CH
1.0000 | ORAL_TABLET | Freq: Every day | ORAL | Status: DC
  Filled 2015-08-13 (×3): qty 1

## 2015-08-13 MED ORDER — ENSURE ENLIVE PO LIQD
237.0000 mL | Freq: Two times a day (BID) | ORAL | Status: DC
  Administered 2015-08-15: 237 mL via ORAL

## 2015-08-13 MED ORDER — SODIUM CHLORIDE 0.9 % IV BOLUS (SEPSIS)
1000.0000 mL | Freq: Once | INTRAVENOUS | Status: AC
  Administered 2015-08-13: 1000 mL via INTRAVENOUS

## 2015-08-13 MED ORDER — SODIUM CHLORIDE 0.9 % IV SOLN
INTRAVENOUS | Status: DC
Start: 2015-08-13 — End: 2015-08-16
  Administered 2015-08-13 – 2015-08-15 (×3): via INTRAVENOUS

## 2015-08-13 MED ORDER — SALINE SPRAY 0.65 % NA SOLN
1.0000 | NASAL | Status: DC | PRN
  Filled 2015-08-13: qty 44

## 2015-08-13 MED ORDER — OXYCODONE HCL 5 MG PO TABS: 5.0000 mg | ORAL_TABLET | ORAL | Status: DC | PRN

## 2015-08-13 MED ORDER — FOOD THICKENER (THICKENUP CLEAR): ORAL | Status: DC

## 2015-08-13 MED ORDER — POLYVINYL ALCOHOL 1.4 % OP SOLN
2.0000 [drp] | Freq: Three times a day (TID) | OPHTHALMIC | Status: DC | PRN
  Filled 2015-08-13: qty 15

## 2015-08-13 MED ORDER — LEVOTHYROXINE SODIUM 25 MCG PO TABS
25.0000 ug | ORAL_TABLET | Freq: Every day | ORAL | Status: DC
  Administered 2015-08-14 – 2015-08-16 (×3): 25 ug via ORAL
  Filled 2015-08-13 (×3): qty 1

## 2015-08-13 MED ORDER — HYPROMELLOSE (GONIOSCOPIC) 2.5 % OP SOLN: 2.0000 [drp] | Freq: Three times a day (TID) | OPHTHALMIC | Status: DC | PRN

## 2015-08-13 NOTE — Progress Notes (Signed)
Summer Jones    [id number]    [birth year]  Primary Care Physician:PERINI,MARK A, MD  Referring Physician: Crist Infante, MD [address] [place], [place] [address]  Chief complaint:  Obstructive jaundice, failure to thrive  HPI:  81 year old female with painless obstructive jaundice recently admitted to the hospital status post PTC and placement of 10 French internal/external biliary drain by IR on 08/08/2015 is here for follow-up office visit. She reports being extremely fatigued with no appetite since discharge. Reports drinking water and some Gatorade. She felt lightheaded this morning while getting out of the car to come to this appointment. She is putting out a lot through the biliary drain, is emptying the 400 mL bag about every 3-4 hours. Denies any abdominal pain, nausea or vomiting. She is constipated and has not had any bowel movement since discharge from the hospital on 12/24. No fever or chills   Outpatient Encounter Prescriptions as of 08/13/2015  Medication Sig  . amLODipine (NORVASC) 10 MG tablet   . levothyroxine (SYNTHROID, LEVOTHROID) 25 MCG tablet Take 25 mcg by mouth daily before breakfast.  . lisinopril (PRINIVIL,ZESTRIL) 5 MG tablet Take 5 mg by mouth daily.  . Multiple Vitamins-Minerals (MULTIVITAMIN GUMMIES ADULT) CHEW Chew 1 tablet by mouth daily.  Marland Kitchen oxyCODONE (OXY IR/ROXICODONE) 5 MG immediate release tablet Take 1 tablet (5 mg total) by mouth every 4 (four) hours as needed for severe pain.  . polyethylene glycol powder (GLYCOLAX/MIRALAX) powder Take 17 g by mouth daily.  . food thickener (RESOURCE THICKENUP CLEAR) POWD Use with 1with every meal and at bedtime   No facility-administered encounter medications on file as of 08/13/2015.    Allergies as of 08/13/2015  . (No Known Allergies)    Past Medical History  Diagnosis Date  . Diverticular disease   . Arthritis   . Diverticulosis   . Hypertension   . Thyroid disease   . Mitral valve  prolapse   . Difficult intubation Anterior glottis, short TMD, limited mouth opening. Required glidescope and bougie stylet with multiple attempts.  . Common bile duct (CBD) obstruction   . Jaundice     Past Surgical History  Procedure Laterality Date  . Inguinal hernia repair      right  . Breast biopsy    . Tonsillectomy    . Eus N/A 08/08/2015    Procedure: UPPER ENDOSCOPIC ULTRASOUND (EUS) RADIAL;  Surgeon: [name], MD;  Location: [facility];  Service: Endoscopy;  Laterality: N/A;  . Endoscopic retrograde cholangiopancreatography (ercp) with propofol N/A 08/08/2015    Procedure: ENDOSCOPIC RETROGRADE CHOLANGIOPANCREATOGRAPHY (ERCP) WITH PROPOFOL;  Surgeon: [name], MD;  Location: [facility];  Service: Endoscopy;  Laterality: N/A;    Family History  Problem Relation Age of Onset  . Breast cancer Sister   . Heart disease Father     Social History   Social History  . Marital Status: Married    Spouse Name: N/A  . Number of Children: 2  . Years of Education: N/A   Occupational History  . Retired    Social History Main Topics  . Smoking status: Never Smoker   . Smokeless tobacco: Never Used  . Alcohol Use: Yes     Comment: 3-4 per week   . Drug Use: No  . Sexual Activity: Not on file   Other Topics Concern  . Not on file   Social History Narrative      Review of  systems: Review of Systems  Constitutional: Negative for fever and chills.  HENT: Negative.   Eyes: Negative for blurred vision.  Respiratory: Negative for cough, shortness of breath and wheezing.   Cardiovascular: Negative for chest pain and palpitations.  Gastrointestinal: as per HPI Genitourinary: Negative for dysuria, urgency, frequency and hematuria.  Musculoskeletal: Negative for myalgias, back pain and joint pain.  Skin: Negative for itching and rash.  Neurological: Negative for dizziness, tremors, focal weakness, seizures and loss of consciousness.  Endo/Heme/Allergies:  Negative for environmental allergies.  Psychiatric/Behavioral: Negative for depression, suicidal ideas and hallucinations.  All other systems reviewed and are negative.   Physical Exam: Filed Vitals:   08/13/15 0822  BP: 70/46  Pulse: 76   Gen:      No acute distress, jaundice, cachectic HEENT:  EOMI, sclera icteric Lungs:    Clear to auscultation bilaterally; normal respiratory effort CV:         Regular rate and rhythm; no murmurs Abd:      + bowel sounds; soft, non-tender; no palpable masses, no distension Ext:    No edema; adequate peripheral perfusion Skin:      Warm and dry; no rash Neuro: alert and oriented x 3 Psych: normal mood and affect  Data Reviewed: Reviewed her her recent imaging and chart  Assessment and Plan/Recommendations: 81 year old female with painless obstructive jaundice status post PTC with 10 French internal/external biliary drain by IR here for follow-up visit  Awaiting labs done this morning Advised patient to maintain  adequate hydration Awaiting biopsy results We'll call patient back with the lab results  Addendum Noted severe hyponatremia with sodium of 120, creatinine elevated at 2. Called patient and advised her to go to ER for management of hyponatremia and acute kidney injury secondary to volume loss and dehydration   K. Denzil Magnuson , MD [phone] Mon-Fri 8a-5p [phone] after 5p, weekends, holidays

## 2015-08-13 NOTE — ED Provider Notes (Signed)
CSN: [id number]     Arrival date & time 08/13/15  1018 History   First MD Initiated Contact with Patient 08/13/15 1157     Chief Complaint  Patient presents with  . Dehydration     (Consider location/radiation/quality/duration/timing/severity/associated sxs/prior Treatment) Patient is a 81 y.o. female presenting with weakness. The history is provided by the patient (The patient was sent over here from her GI office because she was hypo-natriuretic dehydrated).  Weakness This is a recurrent problem. The current episode started 2 days ago. The problem occurs constantly. The problem has not changed since onset.Pertinent negatives include no chest pain, no abdominal pain and no headaches. Nothing aggravates the symptoms. Nothing relieves the symptoms.    Past Medical History  Diagnosis Date  . Diverticular disease   . Arthritis   . Diverticulosis   . Hypertension   . Thyroid disease   . Mitral valve prolapse   . Difficult intubation Anterior glottis, short TMD, limited mouth opening. Required glidescope and bougie stylet with multiple attempts.  . Common bile duct (CBD) obstruction   . Jaundice    Past Surgical History  Procedure Laterality Date  . Inguinal hernia repair      right  . Breast biopsy    . Tonsillectomy    . Eus N/A 08/08/2015    Procedure: UPPER ENDOSCOPIC ULTRASOUND (EUS) RADIAL;  Surgeon: [name], MD;  Location: [facility];  Service: Endoscopy;  Laterality: N/A;  . Endoscopic retrograde cholangiopancreatography (ercp) with propofol N/A 08/08/2015    Procedure: ENDOSCOPIC RETROGRADE CHOLANGIOPANCREATOGRAPHY (ERCP) WITH PROPOFOL;  Surgeon: [name], MD;  Location: [facility];  Service: Endoscopy;  Laterality: N/A;   Family History  Problem Relation Age of Onset  . Breast cancer Sister   . Heart disease Father    Social History  Substance Use Topics  . Smoking status: Never Smoker   . Smokeless tobacco: Never Used  . Alcohol Use: Yes   Comment: 3-4 per week    OB History    No data available     Review of Systems  Constitutional: Negative for appetite change and fatigue.  HENT: Negative for congestion, ear discharge and sinus pressure.   Eyes: Negative for discharge.  Respiratory: Negative for cough.   Cardiovascular: Negative for chest pain.  Gastrointestinal: Negative for abdominal pain and diarrhea.  Genitourinary: Negative for frequency and hematuria.  Musculoskeletal: Negative for back pain.  Skin: Negative for rash.  Neurological: Positive for weakness. Negative for seizures and headaches.  Psychiatric/Behavioral: Negative for hallucinations.      Allergies  Sulfamethoxazole  Home Medications   Prior to Admission medications   Medication Sig Start Date End Date Taking? Authorizing Provider  amLODipine (NORVASC) 10 MG tablet Take 10 mg by mouth daily.  06/16/15  Yes Historical Provider, MD  hydroxypropyl methylcellulose / hypromellose (ISOPTO TEARS / GONIOVISC) 2.5 % ophthalmic solution Place 2 drops into both eyes 3 (three) times daily as needed for dry eyes.   Yes Historical Provider, MD  levothyroxine (SYNTHROID, LEVOTHROID) 25 MCG tablet Take 25 mcg by mouth daily before breakfast.   Yes Historical Provider, MD  lisinopril (PRINIVIL,ZESTRIL) 5 MG tablet Take 5 mg by mouth daily.   Yes Historical Provider, MD  Multiple Vitamins-Minerals (MULTIVITAMIN GUMMIES ADULT) CHEW Chew 1 tablet by mouth daily.   Yes Historical Provider, MD  polyethylene glycol powder (GLYCOLAX/MIRALAX) powder Take 17 g by mouth daily.   Yes Historical Provider, MD  sodium chloride (OCEAN) 0.65 % SOLN nasal spray  Place 1 spray into both nostrils as needed for congestion.   Yes Historical Provider, MD  food thickener (RESOURCE THICKENUP CLEAR) POWD Use with 1with every meal and at bedtime 08/13/15   Mauri Pole, MD  oxyCODONE (OXY IR/ROXICODONE) 5 MG immediate release tablet Take 1 tablet (5 mg total) by mouth every 4 (four)  hours as needed for severe pain. 08/09/15   Donne Hazel, MD   BP 99/60 mmHg  Pulse 70  Temp(Src) 97.7 F (36.5 C) (Oral)  Resp 16  SpO2 100% Physical Exam  Constitutional: She is oriented to person, place, and time. She appears well-developed.  HENT:  Head: Normocephalic.  Eyes: Conjunctivae and EOM are normal. No scleral icterus.  Sclera icteric  Neck: Neck supple. No thyromegaly present.  Cardiovascular: Normal rate and regular rhythm.  Exam reveals no gallop and no friction rub.   No murmur heard. Pulmonary/Chest: No stridor. She has no wheezes. She has no rales. She exhibits no tenderness.  Abdominal: She exhibits no distension. There is no tenderness. There is no rebound.  Patient has a drain in her biliary system  Musculoskeletal: Normal range of motion. She exhibits no edema.  Lymphadenopathy:    She has no cervical adenopathy.  Neurological: She is oriented to person, place, and time. She exhibits normal muscle tone. Coordination normal.  Skin: No rash noted. No erythema.  Patient is jaundice  Psychiatric: She has a normal mood and affect. Her behavior is normal.    ED Course  Procedures (including critical care time) Labs Review Labs Reviewed  CBC - Abnormal; Notable for the following:    RBC 3.55 (*)    Hemoglobin 11.6 (*)    HCT 33.7 (*)    All other components within normal limits    Imaging Review No results found. I have personally reviewed and evaluated these images and lab results as part of my medical decision-making.   EKG Interpretation None      MDM   Final diagnoses:  Hyponatremia  Dehydration    Patient is being admitted for hyponatremia and dehydration    Milton Ferguson, MD 08/13/15 1406

## 2015-08-13 NOTE — Progress Notes (Signed)
Patient refused multi vitamin stating that our pills were to big and she only took the chewable kind, said she didn't need them while she was here.  She also refused heparin shot said she didn't need it.  Patient was educated about risk of DVT's, she verbalized understanding.

## 2015-08-13 NOTE — Progress Notes (Signed)
ED CM updated [facility] Santiago Glad of pt re admission

## 2015-08-13 NOTE — H&P (Signed)
[facility] History and Physical  Summer Jones [id number] DOB: 08/26/1929 DOA: 08/13/2015  Referring physician: Dr. Roderic Palau PCP: [name], MD   Chief Complaint: weakness  HPI: Summer Jones is a 81 y.o. female  Patient is a 81 year old with past medical history of osteoarthritis, hypothyroidism, mitral prolapse who was recently hospitalized for painless jaundice. Patient was found to have CBD stricture and radiology was consulted for PTCA of biliary decompression. Recently went workup and currently is awaiting biopsy results. Patient presented to her gastroenterologist today and was complaining of weakness after drawing lab work patient was found to have elevated serum creatinine and low sodium levels. Subsequently patient presented to the ED for further evaluation. She does not endorse any other problems other than weakness. She does state that she has not been eating or drinking all that well.  We have been consulted for medical evaluation if and recommendations regarding AK and hyponatremia.   Review of Systems:  Constitutional:  No weight loss, night sweats, Fevers, chills,+ fatigue.  HEENT:  No headaches, Difficulty swallowing,Tooth/dental problems,Sore throat,  No sneezing, itching, ear ache, nasal congestion, post nasal drip,  Cardio-vascular:  No chest pain, Orthopnea, PND, swelling in lower extremities, anasarca, dizziness, palpitations  GI:  No heartburn, indigestion, abdominal pain, nausea, vomiting, diarrhea, change in bowel habits, + loss of appetite  Resp:  No shortness of breath with exertion or at rest. No excess mucus, no productive cough, No non-productive cough, No coughing up of blood.No change in color of mucus.No wheezing.No chest wall deformity  Skin:  no rash or lesions.  GU:  no dysuria, change in color of urine, no urgency or frequency. No flank pain.  Musculoskeletal:  No joint pain or swelling. No decreased range of motion. No back pain.    Psych:  No change in mood or affect. No depression or anxiety. No memory loss.   Past Medical History  Diagnosis Date  . Diverticular disease   . Arthritis   . Diverticulosis   . Hypertension   . Thyroid disease   . Mitral valve prolapse   . Difficult intubation Anterior glottis, short TMD, limited mouth opening. Required glidescope and bougie stylet with multiple attempts.  . Common bile duct (CBD) obstruction   . Jaundice    Past Surgical History  Procedure Laterality Date  . Inguinal hernia repair      right  . Breast biopsy    . Tonsillectomy    . Eus N/A 08/08/2015    Procedure: UPPER ENDOSCOPIC ULTRASOUND (EUS) RADIAL;  Surgeon: [name], MD;  Location: [facility];  Service: Endoscopy;  Laterality: N/A;  . Endoscopic retrograde cholangiopancreatography (ercp) with propofol N/A 08/08/2015    Procedure: ENDOSCOPIC RETROGRADE CHOLANGIOPANCREATOGRAPHY (ERCP) WITH PROPOFOL;  Surgeon: [name], MD;  Location: [facility];  Service: Endoscopy;  Laterality: N/A;   Social History:  reports that she has never smoked. She has never used smokeless tobacco. She reports that she drinks alcohol. She reports that she does not use illicit drugs.  Allergies  Allergen Reactions  . Sulfamethoxazole Nausea Only    Family History  Problem Relation Age of Onset  . Breast cancer Sister   . Heart disease Father     Prior to Admission medications   Medication Sig Start Date End Date Taking? Authorizing Provider  amLODipine (NORVASC) 10 MG tablet Take 10 mg by mouth daily.  06/16/15  Yes Historical Provider, MD  hydroxypropyl methylcellulose / hypromellose (ISOPTO TEARS / GONIOVISC) 2.5 % ophthalmic  solution Place 2 drops into both eyes 3 (three) times daily as needed for dry eyes.   Yes Historical Provider, MD  levothyroxine (SYNTHROID, LEVOTHROID) 25 MCG tablet Take 25 mcg by mouth daily before breakfast.   Yes Historical Provider, MD  lisinopril (PRINIVIL,ZESTRIL) 5 MG  tablet Take 5 mg by mouth daily.   Yes Historical Provider, MD  Multiple Vitamins-Minerals (MULTIVITAMIN GUMMIES ADULT) CHEW Chew 1 tablet by mouth daily.   Yes Historical Provider, MD  polyethylene glycol powder (GLYCOLAX/MIRALAX) powder Take 17 g by mouth daily.   Yes Historical Provider, MD  sodium chloride (OCEAN) 0.65 % SOLN nasal spray Place 1 spray into both nostrils as needed for congestion.   Yes Historical Provider, MD  food thickener (RESOURCE THICKENUP CLEAR) POWD Use with 1with every meal and at bedtime 08/13/15   Mauri Pole, MD  oxyCODONE (OXY IR/ROXICODONE) 5 MG immediate release tablet Take 1 tablet (5 mg total) by mouth every 4 (four) hours as needed for severe pain. 08/09/15   Donne Hazel, MD   Physical Exam: Filed Vitals:   08/13/15 1049 08/13/15 1052 08/13/15 1538  BP: 99/60  113/61  Pulse: 70  75  Temp:  97.7 F (36.5 C) 97.9 F (36.6 C)  TempSrc:  Oral Oral  Resp: 16  18  SpO2: 100%  97%    Wt Readings from Last 3 Encounters:  08/13/15 39.633 kg (87 lb 6 oz)  08/08/15 42.185 kg (93 lb)  12/26/13 43.545 kg (96 lb)    General:  Appears calm and comfortable Eyes: PERRL, normal lids, irises & conjunctiva ENT: grossly normal hearing, lips & tongue Neck: no LAD, masses or thyromegaly Cardiovascular: RRR, no rubs No LE edema. Respiratory: CTA bilaterally, no w/r/r. Normal respiratory effort. Abdomen: soft, nt,nd, biliary drain in place and draining bile colored fluid no blood or pus identified in bag. Skin: no rash or induration seen on limited exam Musculoskeletal: grossly normal tone BUE/BLE Psychiatric: grossly normal mood and affect, speech fluent and appropriate Neurologic: grossly non-focal.          Labs on Admission:  Basic Metabolic Panel:  Recent Labs Lab 08/08/15 0510 08/09/15 0455 08/09/15 1345 08/10/15 0526 08/13/15 0802  NA 135 137 136 130* 120*  K 4.9 4.8 4.4 4.1 4.4  CL 105 101 100* 96* 80*  CO2 24 27 28 27 24   GLUCOSE  106* 124* 115* 101* 173*  BUN 6 8 8 8  36*  CREATININE 0.41* 0.54 0.43* 0.39* 2.07*  CALCIUM 9.2 9.2 9.4 9.1 9.7   Liver Function Tests:  Recent Labs Lab 08/08/15 0510 08/09/15 0455 08/09/15 1345 08/10/15 0526 08/13/15 0802  AST 195* 166* 140* 82* 58*  ALT 282* 264* 256* 187* 123*  ALKPHOS 631* 633* 624* 513* 513*  BILITOT 13.8* 11.5* 11.0* 10.8* 10.9*  PROT 5.8* 5.9* 6.7 6.0* 7.3  ALBUMIN 3.1* 3.4* 3.6 3.2* 4.1   No results for input(s): LIPASE, AMYLASE in the last 168 hours. No results for input(s): AMMONIA in the last 168 hours. CBC:  Recent Labs Lab 08/07/15 0515 08/09/15 2015 08/10/15 0526 08/13/15 1112  WBC 4.5 11.6* 9.9 8.0  HGB 10.3* 10.2* 9.1* 11.6*  HCT 29.8* 29.5* 26.8* 33.7*  MCV 94.3 94.9 95.0 94.9  PLT 376 351 263 376   Cardiac Enzymes: No results for input(s): CKTOTAL, CKMB, CKMBINDEX, TROPONINI in the last 168 hours.  BNP (last 3 results) No results for input(s): BNP in the last 8760 hours.  ProBNP (last 3 results) No  results for input(s): PROBNP in the last 8760 hours.  CBG:  Recent Labs Lab 08/07/15 0738 08/08/15 0746 08/09/15 0753 08/10/15 0738  GLUCAP 89 92 91 95    Radiological Exams on Admission: No results found.    Assessment/Plan Principal Problem:   AKI (acute kidney injury) ([place]) - Most likely due to prerenal etiology/dehydration in context of continued use of nephrotoxic agent lisinopril. - We'll discontinue lisinopril and place on IV fluid rehydration  Active Problems:   Dehydration - Rehydrate with normal saline and reassess    Hyponatremia - Will place on normal saline rehydration. Most likely secondary to poor oral solute intake given history. Will obtain BMPs every 6 hours while on IV fluid rehydration.    Hypothyroidism - Stable continue home medication regimen    Common biliary duct obstruction - Patient is status post drain placement. Drain currently draining. Stable we'll have patient continue to  follow-up with gastroenterologist   Code Status: Full DVT Prophylaxis: Heparin Family Communication: Discussed with patient and family members at bedside Disposition Plan: Pending improvement in condition  Time spent: > 55 minutes  [place], [facility] Pager [phone]

## 2015-08-13 NOTE — ED Notes (Signed)
PATIENT  ALREADY HAD A BRUISE ON HER LEFT AC.

## 2015-08-13 NOTE — Patient Instructions (Signed)
We have sent Resource to your pharmacy Interventional Radiology will contact you with a 4 week follow up appointment for your biliary drain

## 2015-08-13 NOTE — ED Notes (Signed)
Pt recently admitted for liver issues/jaundice. Was d/c on Saturday. Had follow-up appointment today and was told she was dehydrated and to return to ED.

## 2015-08-14 DIAGNOSIS — N179 Acute kidney failure, unspecified: Secondary | ICD-10-CM | POA: Diagnosis not present

## 2015-08-14 DIAGNOSIS — E871 Hypo-osmolality and hyponatremia: Secondary | ICD-10-CM

## 2015-08-14 DIAGNOSIS — K831 Obstruction of bile duct: Secondary | ICD-10-CM

## 2015-08-14 DIAGNOSIS — E860 Dehydration: Secondary | ICD-10-CM

## 2015-08-14 DIAGNOSIS — E038 Other specified hypothyroidism: Secondary | ICD-10-CM

## 2015-08-14 LAB — BASIC METABOLIC PANEL
Anion gap: 9 (ref 5–15)
Anion gap: 9 (ref 5–15)
Anion gap: 8 (ref 5–15)
BUN: 27 mg/dL — ABNORMAL HIGH (ref 6–20)
BUN: 24 mg/dL — AB (ref 6–20)
BUN: 21 mg/dL — AB (ref 6–20)
CALCIUM: 8.3 mg/dL — AB (ref 8.9–10.3)
CALCIUM: 8.4 mg/dL — AB (ref 8.9–10.3)
CO2: 21 mmol/L — AB (ref 22–32)
CO2: 24 mmol/L (ref 22–32)
CO2: 26 mmol/L (ref 22–32)
CREATININE: 0.99 mg/dL (ref 0.44–1.00)
CREATININE: 0.83 mg/dL (ref 0.44–1.00)
CREATININE: 0.66 mg/dL (ref 0.44–1.00)
Calcium: 8.8 mg/dL — ABNORMAL LOW (ref 8.9–10.3)
Chloride: 95 mmol/L — ABNORMAL LOW (ref 101–111)
Chloride: 98 mmol/L — ABNORMAL LOW (ref 101–111)
Chloride: 99 mmol/L — ABNORMAL LOW (ref 101–111)
GFR calc Af Amer: >60 mL/min (ref >60)
GFR, EST AFRICAN AMERICAN: 59 mL/min — AB (ref >60)
GFR, EST NON AFRICAN AMERICAN: 51 mL/min — AB (ref >60)
Glucose, Bld: 107 mg/dL — ABNORMAL HIGH (ref 65–99)
Glucose, Bld: 109 mg/dL — ABNORMAL HIGH (ref 65–99)
Glucose, Bld: 120 mg/dL — ABNORMAL HIGH (ref 65–99)
POTASSIUM: 3.8 mmol/L (ref 3.5–5.1)
Potassium: 4.4 mmol/L (ref 3.5–5.1)
Potassium: 4.3 mmol/L (ref 3.5–5.1)
SODIUM: 131 mmol/L — AB (ref 135–145)
SODIUM: 133 mmol/L — AB (ref 135–145)
Sodium: 125 mmol/L — ABNORMAL LOW (ref 135–145)

## 2015-08-14 LAB — CBC
HCT: 24.9 % — ABNORMAL LOW (ref 36.0–46.0)
Hemoglobin: 8.7 g/dL — ABNORMAL LOW (ref 12.0–15.0)
MCH: 32.6 pg (ref 26.0–34.0)
MCHC: 34.9 g/dL (ref 30.0–36.0)
MCV: 93.3 fL (ref 78.0–100.0)
PLATELETS: 291 10*3/uL (ref 150–400)
RBC: 2.67 MIL/uL — AB (ref 3.87–5.11)
RDW: 14.5 % (ref 11.5–15.5)
WBC: 7.4 10*3/uL (ref 4.0–10.5)

## 2015-08-14 MED ORDER — POLYETHYLENE GLYCOL 3350 17 G PO PACK: 17.0000 g | PACK | Freq: Once | ORAL | Status: DC

## 2015-08-14 NOTE — Care Management Note (Signed)
Case Management Note  Patient Details  Name: Summer Jones MRN: [id number] Date of Birth: June 14, 1930  Subjective/Objective:     81 yo admitted with AKI               Action/Plan: From home with spouse. Was active with [facility] for drain care. Will need resumption of care orders for [facility] at discharge.  Expected Discharge Date:   (unknown)               Expected Discharge Plan:  [place]  In-House Referral:     Discharge planning Services  CM Consult  Post Acute Care Choice:    Choice offered to:     DME Arranged:    DME Agency:     HH Arranged:  RN Dresser Agency:  [facility]  Status of Service:  In process, will continue to follow  Medicare Important Message Given:    Date Medicare IM Given:    Medicare IM give by:    Date Additional Medicare IM Given:    Additional Medicare Important Message give by:     If discussed at [place] of Stay Meetings, dates discussed:    Additional Comments:  Lynnell Catalan, RN 08/14/2015, 2:08 PM

## 2015-08-14 NOTE — Progress Notes (Signed)
Subjective: Patient states she has some baseline abdominal pain, but no worsening pain. She denies fevers or chills.   Allergies: Sulfamethoxazole  Medications: Prior to Admission medications   Medication Sig Start Date End Date Taking? Authorizing Provider  amLODipine (NORVASC) 10 MG tablet Take 10 mg by mouth daily.  06/16/15  Yes Historical Provider, MD  hydroxypropyl methylcellulose / hypromellose (ISOPTO TEARS / GONIOVISC) 2.5 % ophthalmic solution Place 2 drops into both eyes 3 (three) times daily as needed for dry eyes.   Yes Historical Provider, MD  levothyroxine (SYNTHROID, LEVOTHROID) 25 MCG tablet Take 25 mcg by mouth daily before breakfast.   Yes Historical Provider, MD  lisinopril (PRINIVIL,ZESTRIL) 5 MG tablet Take 5 mg by mouth daily.   Yes Historical Provider, MD  Multiple Vitamins-Minerals (MULTIVITAMIN GUMMIES ADULT) CHEW Chew 1 tablet by mouth daily.   Yes Historical Provider, MD  polyethylene glycol powder (GLYCOLAX/MIRALAX) powder Take 17 g by mouth daily.   Yes Historical Provider, MD  sodium chloride (OCEAN) 0.65 % SOLN nasal spray Place 1 spray into both nostrils as needed for congestion.   Yes Historical Provider, MD  food thickener (RESOURCE THICKENUP CLEAR) POWD Use with 1with every meal and at bedtime 08/13/15   Mauri Pole, MD  oxyCODONE (OXY IR/ROXICODONE) 5 MG immediate release tablet Take 1 tablet (5 mg total) by mouth every 4 (four) hours as needed for severe pain. 08/09/15   Donne Hazel, MD    Vital Signs: BP 115/68 mmHg  Pulse 68  Temp(Src) 98.6 F (37 C) (Oral)  Resp 18  Ht 5' (1.524 m)  Wt 87 lb 6 oz (39.633 kg)  BMI 17.06 kg/m2  SpO2 100%  Physical Exam General: A&Ox3, NAD Abd: Soft, Distended, biliary drain intact with light yellow output with debris within 200 cc  Imaging: No results found.  Labs:  CBC:  Recent Labs  08/10/15 0526 08/13/15 1112 08/13/15 1719 08/14/15 0505  WBC 9.9 8.0 7.6 7.4  HGB 9.1* 11.6* 9.9*  8.7*  HCT 26.8* 33.7* 28.8* 24.9*  PLT 263 376 361 291    COAGS:  Recent Labs  08/06/15 1429  INR 0.95  APTT 27    BMP:  Recent Labs  08/13/15 1719 08/13/15 2310 08/14/15 0505 08/14/15 1030  NA 122* 126* 125* 131*  K 4.0 4.4 4.4 3.8  CL 87* 89* 95* 98*  CO2 22 26 21* 24  GLUCOSE 111* 110* 107* 109*  BUN 34* 34* 27* 24*  CALCIUM 8.6* 8.5* 8.3* 8.8*  CREATININE 1.75* 1.34* 0.99 0.83  GFRNONAA 26* 36* 51* >60  GFRAA 30* 41* 59* >60    LIVER FUNCTION TESTS:  Recent Labs  08/09/15 0455 08/09/15 1345 08/10/15 0526 08/13/15 0802  BILITOT 11.5* 11.0* 10.8* 10.9*  AST 166* 140* 82* 58*  ALT 264* 256* 187* 123*  ALKPHOS 633* 624* 513* 513*  PROT 5.9* 6.7 6.0* 7.3  ALBUMIN 3.4* 3.6 3.2* 4.1    Assessment and Plan: Biliary obstruction s/p internal/external biliary drain placed and biopsy 12/22 done that revealed no malignancy  Partially occluded biliary drain secondary to thrombus s/p mechanical thrombectomy and debris removal 08/10/15- biliary tube to gravity post procedure cholangiogram revealed patency  Admitted with dehydration/hyponatremia 08/13/15 Dr. Barbie Banner has reviewed this case and we will cap biliary drain today for trial Watch CMP closely and WBC as well as fevers and increased abdominal pain- if significant change in patient's clinical status will need hooked back to gravity bag- discussed with RN IR will  follow    Signed: Hedy Jacob 08/14/2015, 4:03 PM   I spent a total of 15 Minutes at the the patient's bedside AND on the patient's hospital floor or unit, greater than 50% of which was counseling/coordinating care for biliary obstruction.

## 2015-08-14 NOTE — Progress Notes (Signed)
Initial Nutrition Assessment  DOCUMENTATION CODES:   Underweight, Severe malnutrition in context of acute illness/injury  INTERVENTION:  -Ensure Enlive po BID, each supplement provides 350 kcal and 20 grams of protein -RD to continue to monitor for needs  NUTRITION DIAGNOSIS:   Malnutrition related to poor appetite as evidenced by severe depletion of body fat, severe depletion of muscle mass.  GOAL:   Patient will meet greater than or equal to 90% of their needs  MONITOR:   PO intake, Supplement acceptance, I & O's, Labs  REASON FOR ASSESSMENT:   Malnutrition Screening Tool    ASSESSMENT:   Summer Jones is an 81 yo female Patient is a 81 year old with past medical history of osteoarthritis, hypothyroidism, mitral prolapse who was recently hospitalized for painless jaundice. Patient was found to have CBD stricture and radiology was consulted for PTCA of biliary decompression. Recently went workup and currently is awaiting biopsy results. Patient presented to her gastroenterologist today and was complaining of weakness after drawing lab work patient was found to have elevated serum creatinine and low sodium levels. Subsequently patient presented to the ED for further evaluation. She does not endorse any other problems other than weakness. She does state that she has not been eating or drinking all that well.  Spoke with pt at bedside. Pt is well known to RD. Appears pt has been admitted for AKI secondary to dehydration and lisinopril use. Pt reports continued poor PO secondary to bile duct obstruction. This is evidenced by continued weight loss, pt is down to 87# from 93# on previous admit for a 6#/6.5% severe wt loss.  Encouraged pt to continue drinking ensure similar to previous admit. Pt seems to be in a worse mood during this admit, per chart she refused Heparin shot and Multivitamin.  Nutrition-Focused physical exam completed. Findings are severe fat depletion, severe muscle  depletion, and no edema.   Current PO intake 100%  Labs and Medications reviewed  Diet Order:  Diet regular Room service appropriate?: Yes; Fluid consistency:: Thin  Skin:  Reviewed, no issues  Last BM:  08/13/2015  Height:   Ht Readings from Last 1 Encounters:  08/13/15 5' (1.524 m)    Weight:   Wt Readings from Last 1 Encounters:  08/13/15 87 lb 6 oz (39.633 kg)    Ideal Body Weight:  45.45 kg  BMI:  Body mass index is 17.06 kg/(m^2).  Estimated Nutritional Needs:   Kcal:  1600-1900  Protein:  50-60 grams  Fluid:  Per MD  EDUCATION NEEDS:   No education needs identified at this time  Satira Anis. Summer Holstrom, MS, RD LDN After Hours/Weekend Pager [phone]

## 2015-08-14 NOTE — Consult Note (Signed)
[facility] Consult: 2:48 PM 08/14/2015  LOS: 1 day    Referring Provider: Dr Erlinda Hong.   Primary Care Physician:  [name], MD Primary Gastroenterologist:  Dr.  Silverio Decamp   Reason for Consultation:  FTT.     HPI: Summer Jones is a 81 y.o. female.   PMH of hypothyroidism, osteoarthritis, and mitral valve prolapse.  12/20 through 12/24 admission with painless obstructive jaundice, malaise, weight loss.   12/20 CT scan: dilated intra/extra hepatic ducts, cut of near head of pancreas, suspicious for biliary tumor.   12/22 ERCP.  Distorted duodenal anatomy.  Tight, 1cm distal CBD stricture that was not able to be passed with wire.  Extravasation of contrast dye without obvious leak site (presumed leak to be from distal CBD).   12/22 EUS: vagueley abnormal region at [facility].  Hypoechoic, somewhat mass-like area at HOP, 2cm max size.  13 mm CBD, tapers at HOP.  Non-dilated PD.  No pancreatic adenopathy.  12/22 PTC and biopsy and internal/external drain placement by IR. Pathology negative for malignancy.  No tumor markers obtained.  12/23 CT: improved ductal dilatation and good position of tube, + pneumobilia.  12/24 IR cholangiogram:  With suction aspiration and mechanical thrombectomy of partially occluded biliary tube and CBD.   T bili reduced from 16.6 to 10.8, discharged 12/24.  Transaminases and alk phos also improved.   Seen 12/27 by Dr Silverio Decamp for follow up.  Pt c/o fatigue, weakness, no appetite.  No n/v.  Biliary drainage 400 ml every 3 to 4 hours.  No abd pain, no n/v.  +constipation.  Sent to ED, readmitted to hospitalist service.  IR has plans to cap the drain and internalize in a few days if capping goes well.  Pt feeling a bit better after IVF.   Labs show Na of 120 (136 down to 130 at recent admit). But Na  up to 131 now.  + AKI, improved with IVF.   t bili is 10.9.   Good BM today.  Appetite still poor but is managing to eat and drink some.     Past Medical History  Diagnosis Date  . Diverticular disease   . Arthritis   . Diverticulosis   . Hypertension   . Thyroid disease   . Mitral valve prolapse   . Difficult intubation Anterior glottis, short TMD, limited mouth opening. Required glidescope and bougie stylet with multiple attempts.  . Common bile duct (CBD) obstruction   . Jaundice     Past Surgical History  Procedure Laterality Date  . Inguinal hernia repair      right  . Breast biopsy    . Tonsillectomy    . Eus N/A 08/08/2015    Procedure: UPPER ENDOSCOPIC ULTRASOUND (EUS) RADIAL;  Surgeon: [name], MD;  Location: [facility];  Service: Endoscopy;  Laterality: N/A;  . Endoscopic retrograde cholangiopancreatography (ercp) with propofol N/A 08/08/2015    Procedure: ENDOSCOPIC RETROGRADE CHOLANGIOPANCREATOGRAPHY (ERCP) WITH PROPOFOL;  Surgeon: [name], MD;  Location: [facility];  Service: Endoscopy;  Laterality: N/A;  Prior to Admission medications   Medication Sig Start Date End Date Taking? Authorizing Provider  amLODipine (NORVASC) 10 MG tablet Take 10 mg by mouth daily.  06/16/15  Yes Historical Provider, MD  hydroxypropyl methylcellulose / hypromellose (ISOPTO TEARS / GONIOVISC) 2.5 % ophthalmic solution Place 2 drops into both eyes 3 (three) times daily as needed for dry eyes.   Yes Historical Provider, MD  levothyroxine (SYNTHROID, LEVOTHROID) 25 MCG tablet Take 25 mcg by mouth daily before breakfast.   Yes Historical Provider, MD  lisinopril (PRINIVIL,ZESTRIL) 5 MG tablet Take 5 mg by mouth daily.   Yes Historical Provider, MD  Multiple Vitamins-Minerals (MULTIVITAMIN GUMMIES ADULT) CHEW Chew 1 tablet by mouth daily.   Yes Historical Provider, MD  polyethylene glycol powder (GLYCOLAX/MIRALAX) powder Take 17 g by mouth daily.   Yes Historical  Provider, MD  sodium chloride (OCEAN) 0.65 % SOLN nasal spray Place 1 spray into both nostrils as needed for congestion.   Yes Historical Provider, MD  food thickener (RESOURCE THICKENUP CLEAR) POWD Use with 1with every meal and at bedtime 08/13/15   Mauri Pole, MD  oxyCODONE (OXY IR/ROXICODONE) 5 MG immediate release tablet Take 1 tablet (5 mg total) by mouth every 4 (four) hours as needed for severe pain. 08/09/15   Donne Hazel, MD    Scheduled Meds: . feeding supplement (ENSURE ENLIVE)  237 mL Oral BID BM  . heparin  5,000 Units Subcutaneous 3 times per day  . levothyroxine  25 mcg Oral QAC breakfast  . multivitamin with minerals  1 tablet Oral Daily   Infusions: . sodium chloride 100 mL/hr at 08/13/15 1647   PRN Meds: oxyCODONE, polyvinyl alcohol, sodium chloride   Allergies as of 08/13/2015 - Review Complete 08/13/2015  Allergen Reaction Noted  . Sulfamethoxazole Nausea Only 08/13/2015    Family History  Problem Relation Age of Onset  . Breast cancer Sister   . Heart disease Father     Social History   Social History  . Marital Status: Married    Spouse Name: N/A  . Number of Children: 2  . Years of Education: N/A   Occupational History  . Retired    Social History Main Topics  . Smoking status: Never Smoker   . Smokeless tobacco: Never Used  . Alcohol Use: Yes     Comment: 3-4 per week   . Drug Use: No  . Sexual Activity: Not on file   Other Topics Concern  . Not on file   Social History Narrative    REVIEW OF SYSTEMS: Constitutional:  Weakness but able to walk independently at home ENT:  No nose bleeds Pulm:  No SOB or cough CV:  No palpitations, no LE edema.  GU:  No hematuria, no frequency GI:  Per HPI.  Heme:  No  Bleeding per tube   Transfusions:  none Neuro:  No headaches, no peripheral tingling or numbness Derm:  No itching, no rash or sores.  Endocrine:  No sweats or chills.  No polyuria or dysuria Immunization:  Not  inquired.  Travel:  None beyond local counties in last few months.    PHYSICAL EXAM: Vital signs in last 24 hours: Filed Vitals:   08/13/15 2310 08/14/15 0550  BP: 104/58 118/67  Pulse: 76 76  Temp:  98.7 F (37.1 C)  Resp: 18 16   Wt Readings from Last 3 Encounters:  08/13/15 39.633 kg (87 lb 6 oz)  08/13/15 39.633 kg (87 lb 6 oz)  08/08/15 42.185 kg (93 lb)    General: thin, frail but not acutely ill looking.  Minimal jaundice Head:  No swelling or asymmetry  Eyes:  Slight icterus Ears:  Slightly HOH  Nose:  No congestion  Mouth:  Clear, moist, good dentition Neck:  No jvd or mass.  No tmg Lungs:  Clear bil.  No dyspnea Heart: RRR.  No mrg Abdomen:  Soft, ND.  No mass or HSM.  Tannish, cloudy, opaque liquid in biliary drain bag.  Perc site benign.  Slightly tender upper abdomen and left.  No guard or rebound  .   Rectal: deferred   Musc/Skeltl: no joint swelling or erythema Extremities:  No CCE  Neurologic:  Oriented x 3.  No tremor .   Moves all 4s.   Skin:  Slight jaundice Tattoos:  none Nodes:  No adenopathy.    Psych:  Pleasant, cooperative.   Intake/Output from previous day: 12/27 0701 - 12/28 0700 In: 240 [P.O.:240] Out: 2452 [Urine:502; Drains:1950] Intake/Output this shift: Total I/O In: -  Out: 404 [Urine:3; Drains:400; Stool:1]  LAB RESULTS:  Recent Labs  08/13/15 1112 08/13/15 1719 08/14/15 0505  WBC 8.0 7.6 7.4  HGB 11.6* 9.9* 8.7*  HCT 33.7* 28.8* 24.9*  PLT 376 361 291   BMET Lab Results  Component Value Date   NA 131* 08/14/2015   NA 125* 08/14/2015   NA 126* 08/13/2015   K 3.8 08/14/2015   K 4.4 08/14/2015   K 4.4 08/13/2015   CL 98* 08/14/2015   CL 95* 08/14/2015   CL 89* 08/13/2015   CO2 24 08/14/2015   CO2 21* 08/14/2015   CO2 26 08/13/2015   GLUCOSE 109* 08/14/2015   GLUCOSE 107* 08/14/2015   GLUCOSE 110* 08/13/2015   BUN 24* 08/14/2015   BUN 27* 08/14/2015   BUN 34* 08/13/2015   CREATININE 0.83 08/14/2015    CREATININE 0.99 08/14/2015   CREATININE 1.34* 08/13/2015   CALCIUM 8.8* 08/14/2015   CALCIUM 8.3* 08/14/2015   CALCIUM 8.5* 08/13/2015   LFT  Recent Labs  08/13/15 0802  PROT 7.3  ALBUMIN 4.1  AST 58*  ALT 123*  ALKPHOS 513*  BILITOT 10.9*   PT/INR Lab Results  Component Value Date   INR 0.95 08/06/2015   Hepatitis Panel No results for input(s): HEPBSAG, HCVAB, HEPAIGM, HEPBIGM in the last 72 hours. C-Diff No components found for: CDIFF Lipase     Component Value Date/Time   LIPASE 55* 08/06/2015 1429    Drugs of Abuse  No results found for: LABOPIA, COCAINSCRNUR, LABBENZ, AMPHETMU, THCU, LABBARB   RADIOLOGY STUDIES: No results found.  ENDOSCOPIC STUDIES: Per HPI  IMPRESSION:   *  Dehydration FTT in pt with painless, obstructive jaundice and biliary stricture.  S/p perc biliary stent last week.  Drainage volume is large. T bili and LFTs improved from pre perc drain levels.   Etiology of obstruction not clear, mass like area at [facility] on the EUS but perc biopsy was neg for cancer but this is still suspected.   *  Dehydration and hyponatremia, AKI.  Improving with IVF.     PLAN:     *  IR will probably cap tube today, watch t bili level and internalize drain in a few days if all goes well.    *  Check CA 19-9.     Azucena Freed  08/14/2015, 2:48 PM Pager: [phone]

## 2015-08-14 NOTE — Progress Notes (Signed)
PROGRESS NOTE  Summer Jones [id number] DOB: 04-07-1930 DOA: 08/13/2015 PCP: [name], MD  HPI/Recap of past 24 hours:  Documented biliary drain since admission about 2liters, reported poor appetite. Feeling better with hydration, denies pain, husband at bedside  Assessment/Plan: Principal Problem:   AKI (acute kidney injury) ([place]) Active Problems:   Hypothyroidism   Common biliary duct obstruction   Dehydration   Hyponatremia  AKI (acute kidney injury) ([place]) - Most likely due to poor oral intake, gi loss from biliary drain/ dehydration in context of continued use of nephrotoxic agent lisinopril. -  lisinopril discontinued since admission,  on IV fluid rehydration    Hyponatremia -  normal saline rehydration.    Common biliary duct obstruction - Patient is status post drain placement. 2-3 liter output daily _IR and [facility] GI consulted   Hypothyroidism - Stable continue home medication regimen  Code Status: Full DVT Prophylaxis: Heparin Family Communication: Discussed with patient and family members at bedside   Disposition Plan: home when medically ready   Consultants:  [facility] GI  IR  Procedures:  Capping of biliary drain  Antibiotics:  none   Objective: BP 118/67 mmHg  Pulse 76  Temp(Src) 98.7 F (37.1 C) (Oral)  Resp 16  Ht 5' (1.524 m)  Wt 87 lb 6 oz (39.633 kg)  BMI 17.06 kg/m2  SpO2 96%  Intake/Output Summary (Last 24 hours) at 08/14/15 1448 Last data filed at 08/14/15 1343  Gross per 24 hour  Intake    240 ml  Output   2356 ml  Net  -2116 ml   Filed Weights   08/13/15 1533  Weight: 87 lb 6 oz (39.633 kg)    Exam:   General:  NAD, thin  Cardiovascular: RRR  Respiratory: CTABL  Abdomen: Soft/ND/NT, positive BS, biliary drain with yellow fluids, denies pain  Musculoskeletal: No Edema  Neuro: aaox3  Data Reviewed: Basic Metabolic Panel:  Recent Labs Lab 08/13/15 0802 08/13/15 1719 08/13/15 2310 08/14/15 0505  08/14/15 1030  NA 120* 122* 126* 125* 131*  K 4.4 4.0 4.4 4.4 3.8  CL 80* 87* 89* 95* 98*  CO2 24 22 26  21* 24  GLUCOSE 173* 111* 110* 107* 109*  BUN 36* 34* 34* 27* 24*  CREATININE 2.07* 1.75* 1.34* 0.99 0.83  CALCIUM 9.7 8.6* 8.5* 8.3* 8.8*   Liver Function Tests:  Recent Labs Lab 08/08/15 0510 08/09/15 0455 08/09/15 1345 08/10/15 0526 08/13/15 0802  AST 195* 166* 140* 82* 58*  ALT 282* 264* 256* 187* 123*  ALKPHOS 631* 633* 624* 513* 513*  BILITOT 13.8* 11.5* 11.0* 10.8* 10.9*  PROT 5.8* 5.9* 6.7 6.0* 7.3  ALBUMIN 3.1* 3.4* 3.6 3.2* 4.1   No results for input(s): LIPASE, AMYLASE in the last 168 hours. No results for input(s): AMMONIA in the last 168 hours. CBC:  Recent Labs Lab 08/09/15 2015 08/10/15 0526 08/13/15 1112 08/13/15 1719 08/14/15 0505  WBC 11.6* 9.9 8.0 7.6 7.4  HGB 10.2* 9.1* 11.6* 9.9* 8.7*  HCT 29.5* 26.8* 33.7* 28.8* 24.9*  MCV 94.9 95.0 94.9 92.9 93.3  PLT 351 263 376 361 291   Cardiac Enzymes:   No results for input(s): CKTOTAL, CKMB, CKMBINDEX, TROPONINI in the last 168 hours. BNP (last 3 results) No results for input(s): BNP in the last 8760 hours.  ProBNP (last 3 results) No results for input(s): PROBNP in the last 8760 hours.  CBG:  Recent Labs Lab 08/08/15 0746 08/09/15 0753 08/10/15 0738  GLUCAP 92 91 95  No results found for this or any previous visit (from the past 240 hour(s)).   Studies: No results found.  Scheduled Meds: . feeding supplement (ENSURE ENLIVE)  237 mL Oral BID BM  . heparin  5,000 Units Subcutaneous 3 times per day  . levothyroxine  25 mcg Oral QAC breakfast  . multivitamin with minerals  1 tablet Oral Daily    Continuous Infusions: . sodium chloride 100 mL/hr at 08/13/15 1647     Time spent: 69mins  Chevis Weisensel MD, PhD  [facility] Pager [phone]. If 7PM-7AM, please contact night-coverage at [email or web address], password [facility] 08/14/2015, 2:48 PM  LOS: 1 day

## 2015-08-15 DIAGNOSIS — M1990 Unspecified osteoarthritis, unspecified site: Secondary | ICD-10-CM | POA: Diagnosis present

## 2015-08-15 DIAGNOSIS — E43 Unspecified severe protein-calorie malnutrition: Secondary | ICD-10-CM | POA: Diagnosis present

## 2015-08-15 DIAGNOSIS — K5730 Diverticulosis of large intestine without perforation or abscess without bleeding: Secondary | ICD-10-CM | POA: Diagnosis present

## 2015-08-15 DIAGNOSIS — I10 Essential (primary) hypertension: Secondary | ICD-10-CM | POA: Diagnosis present

## 2015-08-15 DIAGNOSIS — Z79899 Other long term (current) drug therapy: Secondary | ICD-10-CM | POA: Diagnosis not present

## 2015-08-15 DIAGNOSIS — Z8249 Family history of ischemic heart disease and other diseases of the circulatory system: Secondary | ICD-10-CM | POA: Diagnosis not present

## 2015-08-15 DIAGNOSIS — R627 Adult failure to thrive: Secondary | ICD-10-CM | POA: Diagnosis present

## 2015-08-15 DIAGNOSIS — R531 Weakness: Secondary | ICD-10-CM | POA: Diagnosis present

## 2015-08-15 DIAGNOSIS — N179 Acute kidney failure, unspecified: Secondary | ICD-10-CM | POA: Diagnosis not present

## 2015-08-15 DIAGNOSIS — E860 Dehydration: Secondary | ICD-10-CM | POA: Diagnosis not present

## 2015-08-15 DIAGNOSIS — Z681 Body mass index (BMI) 19 or less, adult: Secondary | ICD-10-CM | POA: Diagnosis not present

## 2015-08-15 DIAGNOSIS — E871 Hypo-osmolality and hyponatremia: Secondary | ICD-10-CM | POA: Diagnosis not present

## 2015-08-15 DIAGNOSIS — Z803 Family history of malignant neoplasm of breast: Secondary | ICD-10-CM | POA: Diagnosis not present

## 2015-08-15 DIAGNOSIS — E039 Hypothyroidism, unspecified: Secondary | ICD-10-CM | POA: Diagnosis present

## 2015-08-15 DIAGNOSIS — I341 Nonrheumatic mitral (valve) prolapse: Secondary | ICD-10-CM | POA: Diagnosis present

## 2015-08-15 DIAGNOSIS — Z882 Allergy status to sulfonamides status: Secondary | ICD-10-CM | POA: Diagnosis not present

## 2015-08-15 DIAGNOSIS — K831 Obstruction of bile duct: Secondary | ICD-10-CM | POA: Diagnosis not present

## 2015-08-15 LAB — BASIC METABOLIC PANEL
Anion gap: 8 (ref 5–15)
Anion gap: 11 (ref 5–15)
BUN: 15 mg/dL (ref 6–20)
BUN: 14 mg/dL (ref 6–20)
CALCIUM: 8.1 mg/dL — AB (ref 8.9–10.3)
CO2: 23 mmol/L (ref 22–32)
CO2: 23 mmol/L (ref 22–32)
CREATININE: 0.67 mg/dL (ref 0.44–1.00)
CREATININE: 0.56 mg/dL (ref 0.44–1.00)
Calcium: 8.6 mg/dL — ABNORMAL LOW (ref 8.9–10.3)
Chloride: 102 mmol/L (ref 101–111)
Chloride: 100 mmol/L — ABNORMAL LOW (ref 101–111)
GFR calc Af Amer: >60 mL/min (ref >60)
GFR calc Af Amer: >60 mL/min (ref >60)
Glucose, Bld: 98 mg/dL (ref 65–99)
Glucose, Bld: 116 mg/dL — ABNORMAL HIGH (ref 65–99)
POTASSIUM: 5.1 mmol/L (ref 3.5–5.1)
Potassium: 4.3 mmol/L (ref 3.5–5.1)
SODIUM: 133 mmol/L — AB (ref 135–145)
SODIUM: 134 mmol/L — AB (ref 135–145)

## 2015-08-15 LAB — HEPATIC FUNCTION PANEL
ALK PHOS: 427 U/L — AB (ref 38–126)
ALT: 97 U/L — ABNORMAL HIGH (ref 14–54)
AST: 62 U/L — ABNORMAL HIGH (ref 15–41)
Albumin: 2.9 g/dL — ABNORMAL LOW (ref 3.5–5.0)
BILIRUBIN INDIRECT: 3.5 mg/dL — AB (ref 0.3–0.9)
BILIRUBIN TOTAL: 7.5 mg/dL — AB (ref 0.3–1.2)
Bilirubin, Direct: 4 mg/dL — ABNORMAL HIGH (ref 0.1–0.5)
TOTAL PROTEIN: 6.1 g/dL — AB (ref 6.5–8.1)

## 2015-08-15 LAB — CBC
HEMATOCRIT: 27.4 % — AB (ref 36.0–46.0)
Hemoglobin: 9.2 g/dL — ABNORMAL LOW (ref 12.0–15.0)
MCH: 31.9 pg (ref 26.0–34.0)
MCHC: 33.6 g/dL (ref 30.0–36.0)
MCV: 95.1 fL (ref 78.0–100.0)
Platelets: 320 10*3/uL (ref 150–400)
RBC: 2.88 MIL/uL — AB (ref 3.87–5.11)
RDW: 14.4 % (ref 11.5–15.5)
WBC: 7.1 10*3/uL (ref 4.0–10.5)

## 2015-08-15 LAB — MAGNESIUM: Magnesium: 2.1 mg/dL (ref 1.7–2.4)

## 2015-08-15 MED ORDER — SENNOSIDES-DOCUSATE SODIUM 8.6-50 MG PO TABS
1.0000 | ORAL_TABLET | Freq: Two times a day (BID) | ORAL | Status: DC
  Filled 2015-08-15: qty 1

## 2015-08-15 NOTE — Progress Notes (Signed)
Patient refusing SCDs and medications. Counseled patient on the importance of taking scheduled medications and VTE SCD. Patient verbalized understanding of importance. But refused it anyway. Will continue to monitor patient.

## 2015-08-15 NOTE — Progress Notes (Signed)
PROGRESS NOTE  Summer Jones [id number] DOB: 29-Mar-1930 DOA: 08/13/2015 PCP: [name], MD  HPI/Recap of past 24 hours:  biliary drain capped on 12/28, denies ab pain, no fever, t max 99.4, lft continue to trend down Reported feeling better,  Husband in room.  Assessment/Plan: Principal Problem:   AKI (acute kidney injury) ([place]) Active Problems:   Hypothyroidism   Common biliary duct obstruction   Dehydration   Hyponatremia  AKI (acute kidney injury) ([place]) - Most likely due to poor oral intake, gi loss from biliary drain/ dehydration in context of continued use of nephrotoxic agent lisinopril. -  lisinopril discontinued since admission,  on IV fluid since admission, cr normalized, will continue for another 24hrs.    Hyponatremia -  Improving normal saline rehydration.   Common biliary duct obstruction Biliary obstruction s/p internal/external biliary drain placed and biopsy 12/22 done that revealed no malignancy  Partially occluded biliary drain secondary to thrombus s/p mechanical thrombectomy and debris removal 08/10/15- biliary tube to gravity post procedure cholangiogram revealed patency  - readmitted on 12/27 due to dehydration and  2-3 liter output daily from biliary drain, drain capped on 12/28, no fever, no ab pain, lft continue to trend down _IR and [facility] GI input appreciated   Hypothyroidism - Stable continue home medication regimen  Code Status: Full DVT Prophylaxis: refused sub Heparin, start scd's, start PT Family Communication: Discussed with patient and husband at bedside   Disposition Plan: home when medically ready possible in 24-48 hrs   Consultants:  LB GI  IR  Procedures:  Capping of biliary drain on 12/28  Antibiotics:  none   Objective: BP 124/63 mmHg  Pulse 67  Temp(Src) 99.4 F (37.4 C) (Oral)  Resp 16  Ht 5' (1.524 m)  Wt 87 lb 6 oz (39.633 kg)  BMI 17.06 kg/m2  SpO2 100%  Intake/Output Summary (Last 24 hours)  at 08/15/15 0909 Last data filed at 08/15/15 0847  Gross per 24 hour  Intake 2653.33 ml  Output    757 ml  Net 1896.33 ml   Filed Weights   08/13/15 1533  Weight: 87 lb 6 oz (39.633 kg)    Exam:   General:  NAD, thin  Cardiovascular: RRR  Respiratory: CTABL  Abdomen: Soft/ND/NT, positive BS, biliary drain capped  Musculoskeletal: No Edema  Neuro: aaox3  Data Reviewed: Basic Metabolic Panel:  Recent Labs Lab 08/13/15 2310 08/14/15 0505 08/14/15 1030 08/14/15 1800 08/15/15 0454 08/15/15 0500  NA 126* 125* 131* 133*  --  133*  K 4.4 4.4 3.8 4.3  --  5.1  CL 89* 95* 98* 99*  --  102  CO2 26 21* 24 26  --  23  GLUCOSE 110* 107* 109* 120*  --  98  BUN 34* 27* 24* 21*  --  15  CREATININE 1.34* 0.99 0.83 0.66  --  0.67  CALCIUM 8.5* 8.3* 8.8* 8.4*  --  8.6*  MG  --   --   --   --  2.1  --    Liver Function Tests:  Recent Labs Lab 08/09/15 0455 08/09/15 1345 08/10/15 0526 08/13/15 0802 08/15/15 0454  AST 166* 140* 82* 58* 62*  ALT 264* 256* 187* 123* 97*  ALKPHOS 633* 624* 513* 513* 427*  BILITOT 11.5* 11.0* 10.8* 10.9* 7.5*  PROT 5.9* 6.7 6.0* 7.3 6.1*  ALBUMIN 3.4* 3.6 3.2* 4.1 2.9*   No results for input(s): LIPASE, AMYLASE in the last 168 hours. No results for input(s):  AMMONIA in the last 168 hours. CBC:  Recent Labs Lab 08/10/15 0526 08/13/15 1112 08/13/15 1719 08/14/15 0505 08/15/15 0454  WBC 9.9 8.0 7.6 7.4 7.1  HGB 9.1* 11.6* 9.9* 8.7* 9.2*  HCT 26.8* 33.7* 28.8* 24.9* 27.4*  MCV 95.0 94.9 92.9 93.3 95.1  PLT 263 376 361 291 320   Cardiac Enzymes:   No results for input(s): CKTOTAL, CKMB, CKMBINDEX, TROPONINI in the last 168 hours. BNP (last 3 results) No results for input(s): BNP in the last 8760 hours.  ProBNP (last 3 results) No results for input(s): PROBNP in the last 8760 hours.  CBG:  Recent Labs Lab 08/09/15 0753 08/10/15 0738  GLUCAP 91 95    No results found for this or any previous visit (from the past 240  hour(s)).   Studies: No results found.  Scheduled Meds: . feeding supplement (ENSURE ENLIVE)  237 mL Oral BID BM  . heparin  5,000 Units Subcutaneous 3 times per day  . levothyroxine  25 mcg Oral QAC breakfast  . multivitamin with minerals  1 tablet Oral Daily  . polyethylene glycol  17 g Oral Once  . senna-docusate  1 tablet Oral BID    Continuous Infusions: . sodium chloride 100 mL/hr at 08/14/15 1940     Time spent: 17mins  [name] MD, PhD  [facility] Pager [phone]. If 7PM-7AM, please contact night-coverage at [email or web address], password [facility] 08/15/2015, 9:09 AM  LOS: 2 days

## 2015-08-15 NOTE — Plan of Care (Signed)
Problem: Tissue Perfusion: Goal: Risk factors for ineffective tissue perfusion will decrease Outcome: Not Progressing Pt refuses scheduled heparin injections. Understands rationale for ordered VTE prophylaxis.

## 2015-08-15 NOTE — Progress Notes (Signed)
   Subjective: Patient denies any abdominal pain or fevers overnight.   Allergies: Sulfamethoxazole  Medications: Prior to Admission medications   Medication Sig Start Date End Date Taking? Authorizing Provider  amLODipine (NORVASC) 10 MG tablet Take 10 mg by mouth daily.  06/16/15  Yes Historical Provider, MD  hydroxypropyl methylcellulose / hypromellose (ISOPTO TEARS / GONIOVISC) 2.5 % ophthalmic solution Place 2 drops into both eyes 3 (three) times daily as needed for dry eyes.   Yes Historical Provider, MD  levothyroxine (SYNTHROID, LEVOTHROID) 25 MCG tablet Take 25 mcg by mouth daily before breakfast.   Yes Historical Provider, MD  lisinopril (PRINIVIL,ZESTRIL) 5 MG tablet Take 5 mg by mouth daily.   Yes Historical Provider, MD  Multiple Vitamins-Minerals (MULTIVITAMIN GUMMIES ADULT) CHEW Chew 1 tablet by mouth daily.   Yes Historical Provider, MD  polyethylene glycol powder (GLYCOLAX/MIRALAX) powder Take 17 g by mouth daily.   Yes Historical Provider, MD  sodium chloride (OCEAN) 0.65 % SOLN nasal spray Place 1 spray into both nostrils as needed for congestion.   Yes Historical Provider, MD  food thickener (RESOURCE THICKENUP CLEAR) POWD Use with 1with every meal and at bedtime 08/13/15   Mauri Pole, MD  oxyCODONE (OXY IR/ROXICODONE) 5 MG immediate release tablet Take 1 tablet (5 mg total) by mouth every 4 (four) hours as needed for severe pain. 08/09/15   Donne Hazel, MD   Vital Signs: BP 131/62 mmHg  Pulse 71  Temp(Src) 99.1 F (37.3 C) (Oral)  Resp 16  Ht 5' (1.524 m)  Wt 87 lb 6 oz (39.633 kg)  BMI 17.06 kg/m2  SpO2 100%  Physical Exam General: A&Ox3, NAD Abd: Soft, NT, distended, biliary drain intact and capped  Imaging: No results found.  Labs:  CBC:  Recent Labs  08/13/15 1112 08/13/15 1719 08/14/15 0505 08/15/15 0454  WBC 8.0 7.6 7.4 7.1  HGB 11.6* 9.9* 8.7* 9.2*  HCT 33.7* 28.8* 24.9* 27.4*  PLT 376 361 291 320    COAGS:  Recent Labs  08/06/15 1429  INR 0.95  APTT 27    BMP:  Recent Labs  08/14/15 0505 08/14/15 1030 08/14/15 1800 08/15/15 0500  NA 125* 131* 133* 133*  K 4.4 3.8 4.3 5.1  CL 95* 98* 99* 102  CO2 21* 24 26 23   GLUCOSE 107* 109* 120* 98  BUN 27* 24* 21* 15  CALCIUM 8.3* 8.8* 8.4* 8.6*  CREATININE 0.99 0.83 0.66 0.67  GFRNONAA 51* >60 >60 >60  GFRAA 59* >60 >60 >60    LIVER FUNCTION TESTS:  Recent Labs  08/09/15 1345 08/10/15 0526 08/13/15 0802 08/15/15 0454  BILITOT 11.0* 10.8* 10.9* 7.5*  AST 140* 82* 58* 62*  ALT 256* 187* 123* 97*  ALKPHOS 624* 513* 513* 427*  PROT 6.7 6.0* 7.3 6.1*  ALBUMIN 3.6 3.2* 4.1 2.9*    Assessment and Plan: Biliary obstruction s/p internal/external biliary drain placed and biopsy 12/22 done that revealed no malignancy  Partially occluded biliary drain secondary to thrombus s/p mechanical thrombectomy and debris removal 08/10/15- biliary tube to gravity post procedure cholangiogram revealed patency  Admitted with dehydration/hyponatremia 08/13/15 Biliary drain capped 12/28, T. Bili 7.5 (10.9), wbc wnl, afebrile Check am labs   Signed: Hedy Jacob 08/15/2015, 4:26 PM

## 2015-08-15 NOTE — Progress Notes (Signed)
Changed gauze dressing around biliary drain. Gauze had small amount of green drainage. Will continue to monitor patient.

## 2015-08-15 NOTE — Progress Notes (Signed)
Patient ID: Summer Jones, female   DOB: 08-Jun-1930, 81 y.o.   MRN: [id number]  GI will follow peripherally, IR managing drain. [id number] pending Initial bx of bile duct negative  Will discuss further with Dr [name] regarding if future imaging vs repeat EUS is warranted.  [place], MD [facility] Pager [phone]

## 2015-08-15 NOTE — Evaluation (Signed)
Physical Therapy One Time Evaluation Patient Details Name: Summer Jones MRN: [id number] DOB: 14-Aug-1930 Today's Date: 08/15/2015   History of Present Illness  Pt is an 81 year old famel admitted with dehydration/hyponatremia on 08/13/15.  Recent admission with partially occluded biliary drain secondary to thrombus s/p mechanical thrombectomy and debris removal 08/10/15- biliary tube to gravity post procedure cholangiogram revealed patency   Clinical Impression  Patient evaluated by Physical Therapy with no further acute PT needs identified. All education has been completed and the patient has no further questions. Pt mobilizing very well and encouraged to ambulate during acute stay. See below for any follow-up Physical Therapy or equipment needs. PT is signing off. Thank you for this referral.       Follow Up Recommendations No PT follow up    Equipment Recommendations  None recommended by PT    Recommendations for Other Services       Precautions / Restrictions Precautions Precautions: None      Mobility  Bed Mobility Overal bed mobility: Independent                Transfers Overall transfer level: Independent                  Ambulation/Gait Ambulation/Gait assistance: Independent Ambulation Distance (Feet): 400 Feet Assistive device: None Gait Pattern/deviations: WFL(Within Functional Limits)   Gait velocity interpretation: at or above normal speed for age/gender General Gait Details: no LOB or unsteadiness observed, SpO2 99-100% room air and HR 85 bpm end of ambulation  Stairs            Wheelchair Mobility    Modified Rankin (Stroke Patients Only)       Balance                                             Pertinent Vitals/Pain Pain Assessment: No/denies pain    Home Living Family/patient expects to be discharged to:: Private residence Living Arrangements: Spouse/significant other Available Help at Discharge:  Family;Available 24 hours/day Type of Home: House Home Access: Level entry     Home Layout: One level Home Equipment: None      Prior Function Level of Independence: Independent               Hand Dominance        Extremity/Trunk Assessment   Upper Extremity Assessment: Overall WFL for tasks assessed           Lower Extremity Assessment: Overall WFL for tasks assessed         Communication   Communication: No difficulties  Cognition Arousal/Alertness: Awake/alert Behavior During Therapy: WFL for tasks assessed/performed Overall Cognitive Status: Within Functional Limits for tasks assessed                      General Comments      Exercises        Assessment/Plan    PT Assessment Patent does not need any further PT services  PT Diagnosis     PT Problem List    PT Treatment Interventions     PT Goals (Current goals can be found in the Care Plan section) Acute Rehab PT Goals PT Goal Formulation: All assessment and education complete, DC therapy    Frequency     Barriers to discharge        Co-evaluation  End of Session   Activity Tolerance: Patient tolerated treatment well Patient left: with family/visitor present;Other (comment) (in bathroom) Nurse Communication: Mobility status    Functional Assessment Tool Used: clinical judgement Functional Limitation: Mobility: Walking and moving around Mobility: Walking and Moving Around Current Status [phone]): 0 percent impaired, limited or restricted Mobility: Walking and Moving Around Goal Status [phone]): 0 percent impaired, limited or restricted Mobility: Walking and Moving Around Discharge Status [phone]): 0 percent impaired, limited or restricted    Time: 1029-1039 PT Time Calculation (min) (ACUTE ONLY): 10 min   Charges:   PT Evaluation $Initial PT Evaluation Tier I: 1 Procedure     PT G Codes:   PT G-Codes **NOT FOR INPATIENT CLASS** Functional Assessment  Tool Used: clinical judgement Functional Limitation: Mobility: Walking and moving around Mobility: Walking and Moving Around Current Status [id number]): 0 percent impaired, limited or restricted Mobility: Walking and Moving Around Goal Status [id number]): 0 percent impaired, limited or restricted Mobility: Walking and Moving Around Discharge Status [id number]): 0 percent impaired, limited or restricted    Dorthea Maina,KATHrine E 08/15/2015, 11:44 AM Carmelia Bake, PT, DPT 08/15/2015 Pager: [phone]

## 2015-08-16 ENCOUNTER — Other Ambulatory Visit: Payer: Self-pay | Admitting: Radiology

## 2015-08-16 DIAGNOSIS — I10 Essential (primary) hypertension: Secondary | ICD-10-CM

## 2015-08-16 DIAGNOSIS — K831 Obstruction of bile duct: Secondary | ICD-10-CM

## 2015-08-16 LAB — COMPREHENSIVE METABOLIC PANEL
ALBUMIN: 2.7 g/dL — AB (ref 3.5–5.0)
ALT: 92 U/L — ABNORMAL HIGH (ref 14–54)
ANION GAP: 8 (ref 5–15)
AST: 59 U/L — ABNORMAL HIGH (ref 15–41)
Alkaline Phosphatase: 393 U/L — ABNORMAL HIGH (ref 38–126)
BILIRUBIN TOTAL: 5.6 mg/dL — AB (ref 0.3–1.2)
BUN: 10 mg/dL (ref 6–20)
CO2: 22 mmol/L (ref 22–32)
Calcium: 8.4 mg/dL — ABNORMAL LOW (ref 8.9–10.3)
Chloride: 101 mmol/L (ref 101–111)
Creatinine, Ser: 0.52 mg/dL (ref 0.44–1.00)
GFR calc non Af Amer: >60 mL/min (ref >60)
GLUCOSE: 100 mg/dL — AB (ref 65–99)
POTASSIUM: 4.5 mmol/L (ref 3.5–5.1)
SODIUM: 131 mmol/L — AB (ref 135–145)
TOTAL PROTEIN: 5.5 g/dL — AB (ref 6.5–8.1)

## 2015-08-16 LAB — CANCER ANTIGEN 19-9: CA 19 9: 102 U/mL — AB (ref 0–35)

## 2015-08-16 LAB — CBC
HEMATOCRIT: 26.7 % — AB (ref 36.0–46.0)
Hemoglobin: 8.9 g/dL — ABNORMAL LOW (ref 12.0–15.0)
MCH: 32.4 pg (ref 26.0–34.0)
MCHC: 33.3 g/dL (ref 30.0–36.0)
MCV: 97.1 fL (ref 78.0–100.0)
Platelets: 312 10*3/uL (ref 150–400)
RBC: 2.75 MIL/uL — ABNORMAL LOW (ref 3.87–5.11)
RDW: 14.8 % (ref 11.5–15.5)
WBC: 7.1 10*3/uL (ref 4.0–10.5)

## 2015-08-16 MED ORDER — SODIUM CHLORIDE 1 G PO TABS
1.0000 g | ORAL_TABLET | Freq: Two times a day (BID) | ORAL | Status: DC
  Filled 2015-08-16 (×2): qty 1

## 2015-08-16 MED ORDER — SODIUM CHLORIDE 1 G PO TABS: 1.0000 g | ORAL_TABLET | Freq: Two times a day (BID) | ORAL | Status: DC

## 2015-08-16 MED ORDER — ENSURE ENLIVE PO LIQD: 237.0000 mL | Freq: Two times a day (BID) | ORAL | Status: DC

## 2015-08-16 NOTE — Discharge Summary (Addendum)
Discharge Summary  MERCURY SCHULMEISTER [id number] DOB: [birth year]  PCP: [name], MD  Admit date: 08/13/2015 Discharge date: 08/16/2015  Time spent: >22mins  Recommendations for Outpatient Follow-up:  1. F/u with PMD within two weeks for hospital discharge follow up, repeat cbc/cmp at follow up. 2. F/u with interventional radiology early next week for biopsy and biliary drain management 3. F/u with [facility] for obstructive jaudice  Discharge Diagnoses:  Active Hospital Problems   Diagnosis Date Noted  . AKI (acute kidney injury) ([place]) 08/13/2015  . Dehydration 08/13/2015  . Hyponatremia 08/13/2015  . Common biliary duct obstruction   . Hypothyroidism 06/13/2013    Resolved Hospital Problems   Diagnosis Date Noted Date Resolved  No resolved problems to display.    Discharge Condition: stable  Diet recommendation: low fat diet  Filed Weights   08/13/15 1533  Weight: 87 lb 6 oz (39.633 kg)    History of present illness:  Summer Jones is a 81 y.o. female  Patient is a 81 year old with past medical history of osteoarthritis, hypothyroidism, mitral prolapse who was recently hospitalized for painless jaundice. Patient was found to have CBD stricture and radiology was consulted for PTCA of biliary decompression. Recently went workup and currently is awaiting biopsy results. Patient presented to her gastroenterologist today and was complaining of weakness after drawing lab work patient was found to have elevated serum creatinine and low sodium levels. Subsequently patient presented to the ED for further evaluation. She does not endorse any other problems other than weakness. She does state that she has not been eating or drinking all that well.  We have been consulted for medical evaluation if and recommendations regarding AK and hyponatremia.   Hospital Course:  Principal Problem:   AKI (acute kidney injury) ([place]) Active Problems:   Hypothyroidism   Common biliary duct  obstruction   Dehydration   Hyponatremia  AKI (acute kidney injury) ([place]) - Most likely due to poor oral intake, gi loss from biliary drain/ dehydration in context of continued use of nephrotoxic agent lisinopril. - lisinopril discontinued since admission, on IV fluid since admission, cr normalized, -f/u with pmd to repeat basic labs   Hyponatremia - Improving normal saline rehydration. -patient reported she drinks a lot of free water daily, encourage she drink gatorade,  i have also provided salt tabs bid for two day, she is advised to f/u with pmd to repeat labs   Common biliary duct obstruction, obstructive jaundice: Biliary obstruction s/p internal/external biliary drain placed and biopsy 12/22 done that revealed no malignancy  Partially occluded biliary drain secondary to thrombus s/p mechanical thrombectomy and debris removal 08/10/15- biliary tube to gravity post procedure cholangiogram revealed patency  - readmitted on 12/27 due to dehydration and 2-3 liter output daily from biliary drain, drain capped on 12/28, no fever, no ab pain, lft continue to trend down _IR cleared patient to be discharged home with close follow up with interventional radiology early next week for another biopsy. - [facility] also consulted during this admission who ordered CA19-9 which is elevated at 102, close f/u [facility] outpatient.  Hypothyroidism - Stable continue home medication regimen  HTN; bp meds held during hospitalization due to dehydration and arf, home bp meds restarted at discharge with resolution of acute issues.  Underweight, Severe malnutrition in context of acute illness/injury Appreciate nutrition consult. Nutrition supplement provided.  Code Status: Full DVT Prophylaxis: refused sub Heparin, start scd's,  Family Communication: Discussed with patient and husband at bedside  at length, due to lack of understanding the disease process and possibly underline mild impairment of memory,  patient and husband are having a hard time to understand the plan of care ( repeated states that nobody tells them anything) even with multiple specialties talking to them daily,   i have spent extra time with them prior to discharge explained to them about the disease process and the need to close follow up with pmd/GI and interventional radiology, all follow up information are written done for them.  Disposition Plan: home on 12/30   Consultants:  [facility]  IR  Procedures:  Capping of biliary drain on 12/28  Antibiotics:  none  Discharge Exam: BP 136/62 mmHg  Pulse 71  Temp(Src) 98.1 F (36.7 C) (Oral)  Resp 16  Ht 5' (1.524 m)  Wt 87 lb 6 oz (39.633 kg)  BMI 17.06 kg/m2  SpO2 98%   General: NAD, thin  Cardiovascular: RRR  Respiratory: CTABL  Abdomen: Soft/ND/NT, positive BS, biliary drain capped, no leakage observed  Musculoskeletal: No Edema  Neuro: aaox3   Discharge Instructions You were cared for by a hospitalist during your hospital stay. If you have any questions about your discharge medications or the care you received while you were in the hospital after you are discharged, you can call the unit and asked to speak with the hospitalist on call if the hospitalist that took care of you is not available. Once you are discharged, your primary care physician will handle any further medical issues. Please note that NO REFILLS for any discharge medications will be authorized once you are discharged, as it is imperative that you return to your primary care physician (or establish a relationship with a primary care physician if you do not have one) for your aftercare needs so that they can reassess your need for medications and monitor your lab values.  Discharge Instructions    Discharge instructions    Complete by:  As directed   Low fat diet,     Increase activity slowly    Complete by:  As directed             Medication List    STOP taking these  medications        food thickener Powd  Commonly known as:  RESOURCE THICKENUP CLEAR     oxyCODONE 5 MG immediate release tablet  Commonly known as:  Oxy IR/ROXICODONE      TAKE these medications        amLODipine 10 MG tablet  Commonly known as:  NORVASC  Take 10 mg by mouth daily.     feeding supplement (ENSURE ENLIVE) Liqd  Take 237 mLs by mouth 2 (two) times daily between meals.     hydroxypropyl methylcellulose / hypromellose 2.5 % ophthalmic solution  Commonly known as:  ISOPTO TEARS / GONIOVISC  Place 2 drops into both eyes 3 (three) times daily as needed for dry eyes.     levothyroxine 25 MCG tablet  Commonly known as:  SYNTHROID, LEVOTHROID  Take 25 mcg by mouth daily before breakfast.     lisinopril 5 MG tablet  Commonly known as:  PRINIVIL,ZESTRIL  Take 5 mg by mouth daily.     MULTIVITAMIN GUMMIES ADULT Chew  Chew 1 tablet by mouth daily.     polyethylene glycol powder powder  Commonly known as:  GLYCOLAX/MIRALAX  Take 17 g by mouth daily.     sodium chloride 0.65 % Soln nasal spray  Commonly known  as:  OCEAN  Place 1 spray into both nostrils as needed for congestion.     sodium chloride 1 g tablet  Take 1 tablet (1 g total) by mouth 2 (two) times daily with a meal.       Allergies  Allergen Reactions  . Sulfamethoxazole Nausea Only       Follow-up Information    Follow up with PERINI,MARK A, MD In 2 weeks.   Specialty:  Internal Medicine   Why:  hospital discharge follow up, repeat    Contact information:   [place] [address] [phone]       Follow up with Harl Bowie, MD In 3 weeks.   Specialty:  Gastroenterology   Why:  follow up with GI for obstructive juandice   Contact information:   [place] [address] [phone]       Follow up with follow up with interventional radiology for biopsy.       The results of significant diagnostics from this hospitalization (including imaging,  microbiology, ancillary and laboratory) are listed below for reference.    Significant Diagnostic Studies: Ct Abdomen Pelvis W Contrast  08/09/2015  CLINICAL DATA:  Biliary drainage catheter placement today with bleeding in Uresil bag and decreased biliary output. EXAM: CT ABDOMEN AND PELVIS WITH CONTRAST TECHNIQUE: Multidetector CT imaging of the abdomen and pelvis was performed using the standard protocol following bolus administration of intravenous contrast. CONTRAST:  122mL OMNIPAQUE IOHEXOL 300 MG/ML  SOLN COMPARISON:  CT 08/06/2015. FINDINGS: Lower chest: Mild atelectasis at the right lung base. No significant pleural or pericardial effusion. Stable small hiatal hernia. Hepatobiliary: Percutaneous transhepatic biliary tube appears well positioned. This enters via the left hepatic lobe and terminates within the second portion of the duodenum. Biliary dilatation has mildly improved. There is intrahepatic pneumobilia. There is no focal fluid collection along the biliary tube. There is contrast material and air within the gallbladder lumen. No focal hepatic abnormalities are identified. Pancreas: Unremarkable. No pancreatic ductal dilatation or surrounding inflammatory changes. Spleen: Normal in size without focal abnormality. Adrenals/Urinary Tract: Both adrenal glands appear normal. The kidneys appear stable without suspicious findings. There is a tiny nonobstructing calculus in the upper pole of the right kidney and probable small bilateral renal cysts. No hydronephrosis. The bladder appears unremarkable. Stomach/Bowel: No evidence of bowel wall thickening, distention or surrounding inflammatory change. Prominent liquid stool in the cecum. Vascular/Lymphatic: There are no enlarged abdominal or pelvic lymph nodes. Atherosclerosis of the aorta, its branches and the iliac arteries again noted. Reproductive: Unremarkable. Other: There is no ascites or free intraperitoneal air. Musculoskeletal: No acute or  significant osseous findings. Stable degenerative changes within the spine associated with a convex left scoliosis. IMPRESSION: 1. No demonstrated complication following percutaneous biliary tube placement. The tube appears well positioned. Biliary dilatation has mildly improved and there is pneumobilia. 2. No new findings identified. 3. Atherosclerosis. Electronically Signed   By: Richardean Sale M.D.   On: 08/09/2015 21:57   Ct Abdomen Pelvis W Contrast  08/06/2015  CLINICAL DATA:  Increasing fatigue since October. Yesterday patient becmae jaundiced. Elevated liver function studies. EXAM: CT ABDOMEN AND PELVIS WITH CONTRAST TECHNIQUE: Multidetector CT imaging of the abdomen and pelvis was performed using the standard protocol following bolus administration of intravenous contrast. CONTRAST:  127mL OMNIPAQUE IOHEXOL 300 MG/ML  SOLN COMPARISON:  CT scan 06/19/2013 FINDINGS: Lower chest: The lung bases are clear of acute process. No pleural effusion or pulmonary nodules. The heart  is normal in size for age. No pericardial effusion. The small hiatal hernia is noted. Hepatobiliary: Significant intrahepatic biliary dilatation. The common hepatic duct is also dilated and there is an abrupt cut off of the common bile duct as it enters the pancreatic head area. Findings could be due to a common bile duct stone, biliary tumor (cholangiocarcinoma) or possibly a pancreatic lesion. I believe a biliary tumor is most likely. The pancreatic duct is normal and I do not see an obvious pancreatic mass. MRI abdomen/ MRCP may be helpful for further evaluation if the patient can hold her breath adequately. Otherwise, ERCP may be helpful for both diagnostic and therapeutic purposes if the obstruction can be crossed. No focal hepatic lesions are identified. The portal and hepatic veins are patent. Pancreas: No definite pancreatic mass or inflammation. The pancreatic duct is normal in caliber and has a normal course. Spleen: Normal  size.  No focal lesions. Adrenals/Urinary Tract: The adrenal glands and kidneys are normal. Stomach/Bowel: The stomach, duodenum, small bowel and colon are unremarkable. No inflammatory changes, mass lesions or obstructive findings. Diffuse colonic diverticulosis but no findings for acute diverticulitis. The terminal ileum is normal. The appendix is normal. Vascular/Lymphatic: No mesenteric or retroperitoneal mass or adenopathy. Specifically, I do not see any portal or periportal mass or adenopathy. Scattered atherosclerotic calcifications involving the aorta but fairly minimal for age. The major venous structures are patent. Reproductive: The uterus and ovaries are normal. Other: The bladder is normal. No pelvic mass, adenopathy or free pelvic fluid collections. No inguinal mass or adenopathy. Musculoskeletal: Left convex thoracolumbar scoliosis and associated degenerative lumbar spondylosis. No acute bony findings or destructive bony changes. IMPRESSION: 1. Intra and extrahepatic biliary dilatation with an abrupt cut off of the common bile duct near its entry into the pancreatic head. Findings most suspicious for a biliary tumor (cholangiocarcinoma) but a common bile duct stone and pancreatic lesion are also possible. MRCP may be helpful for further evaluation if the patient can hold her breath. Otherwise, ERCP is suggested for both diagnostic and therapeutic purposes. 2. Moderate distention of the gallbladder without obvious gallstones. 3. No liver lesions or abdominal lymphadenopathy. Electronically Signed   By: Marijo Sanes M.D.   On: 08/06/2015 17:34   Dg Ercp  08/08/2015  CLINICAL DATA:  Distal biliary obstruction EXAM: ERCP attempted, failed endoscopic stent placement TECHNIQUE: Multiple spot images obtained with the fluoroscopic device and submitted for interpretation post-procedure. FLUOROSCOPY TIME:  Radiation Exposure Index (as provided by the fluoroscopic device): 19.9 mGy If the device does not  provide the exposure index: Fluoroscopy Time:  4 minutes 31 seconds Number of Acquired Images:  2 COMPARISON:  08/06/2015 FINDINGS: Limited opacification of the distal CBD over a short segment. Distal CBD obstruction/severe stricture suspected. IMPRESSION: Incomplete cholangiogram.  Distal CBD obstruction/severe stricture. These images were submitted for radiologic interpretation only. Please see the procedural report for the amount of contrast and the fluoroscopy time utilized. Electronically Signed   By: Jerilynn Mages.  Shick M.D.   On: 08/08/2015 15:04   Dg Abd Portable 1v  08/09/2015  CLINICAL DATA:  Abdominal pain, assess drainage catheter position EXAM: PORTABLE ABDOMEN - 1 VIEW COMPARISON:  08/08/2015 FINDINGS: There is normal small bowel gas pattern. Again noted a percutaneous abdominal catheter. Tip of the catheter is about 3 cm right lateral to L4 vertebral body in right abdomen stable in position from prior exam. Abundant stool noted in right colon. IMPRESSION: Percutaneous abdominal catheter without change in position from prior exam.  Please see image 12 from previous study. Electronically Signed   By: Lahoma Crocker M.D.   On: 08/09/2015 19:20   Ir Cholangiogram Existing Tube  08/10/2015  CLINICAL DATA:  81 year old female with obstructed jaundice 2 days status post percutaneous transhepatic cholangiogram, distal bile duct obstruction crossing with biopsy and placement of an internal/external biliary drain. Patient was ready for discharge today before she began to develop right upper quadrant pain, and decreased drain output with some evidence of hemobilia. EXAM: IR REMOVE CALCULI;/DEBRIS BILIARY DUCT/GB; CHOLANGIOGRAM VIA EXISTING CATHETER Date: 08/10/2015 PROCEDURE: 1. Cholangiogram through existing tube 2. Suction aspiration and mechanical thrombectomy of the partially occluded biliary tube and common bile duct Interventional Radiologist:  Criselda Peaches, MD ANESTHESIA/SEDATION: None required  MEDICATIONS: None FLUOROSCOPY TIME:  4 minutes 42 seconds for a total of 37 mGy CONTRAST:  73mL OMNIPAQUE IOHEXOL 300 MG/ML  SOLN TECHNIQUE: Informed consent was obtained from the patient following explanation of the procedure, risks, benefits and alternatives. The patient understands, agrees and consents for the procedure. All questions were addressed. A time out was performed. Maximal barrier sterile technique utilized including caps, mask, sterile gowns, sterile gloves, large sterile drape, hand hygiene, and Betadine skin prep. An initial hand injection of contrast material opacifies the left and right intrahepatic ducts is well as the common hepatic duct, gallbladder and common bile duct up to the point of occlusion. A linear tube like filling defect within the common hepatic duct is most consistent with thrombus. The internal/external biliary drainage catheter is completely occluded beginning in the region of the thrombus. Using a stiff glidewire, the glidewire was carefully advanced through the tube and into the duodenum. The Glidewire was then manipulated vigorously to perform mechanical breakdown of the thrombus. Next, a 4 French straight glide catheter was advanced over the Glidewire and positioned at the distal tip of the biliary tube in the duodenum. Suction thrombectomy was then performed throughout the occluded segment of the tube followed by forceful injection through the glide catheter into the distal portion of the tube. Aspiration yielded a significant amount of small fragmented thrombus particles and debris. Repeat cholangiogram through the existing tube confirms complete patency of the tube with contrast material filling the duodenum. The tube was gently flushed with saline and reconnected to gravity bag drainage. The patient tolerated the procedure well. COMPLICATIONS: None IMPRESSION: 1. Partially occluded internal/external biliary drainage catheter secondary to thrombus within the common bile  duct and tube. 2. Successful mechanical thrombectomy and debris removal with restoration of tube patency. PLAN: Maintain tube to gravity drainage. I see no reason why discharge cannot be completed today. If the patient has additional problems with her tube over the holiday weekend she should call the interventional radiologist on call. Signed, Criselda Peaches, MD [facility] Rad[facility] Electronically Signed   By: Jacqulynn Cadet M.D.   On: 08/10/2015 11:51   Ir Int Lianne Cure Biliary Drain With Cholangiogram  08/09/2015  CLINICAL DATA:  81 year old female with obstructed jaundice and severe hyperbilirubinemia. ERCP was unsuccessful secondary to high-grade stenosis in the distal common bile duct. Patient presents for percutaneous transhepatic cholangiography with possible biliary duct biopsy and placement of a biliary drain EXAM: IR INT-EXT BILIARY DRAIN W/ CHOLANGIOGRAM; IR ENDOLUMINAL BIOPSY OF BILIARY TREE Date: 08/09/2015 PROCEDURE: 1. Ultrasound-guided puncture of a peripheral left biliary duct 2. Percutaneous transhepatic cholangiogram 3. Successful crossing of distal common bile duct occlusion 4. Alligator forceps biopsy of distal common bile duct 5. Placement of  an internal/external biliary drainage catheter Interventional Radiologist:  Criselda Peaches, MD ANESTHESIA/SEDATION: Moderate (conscious) sedation was used. 4 mg Versed, 100 mcg Fentanyl were administered intravenously. The patient's vital signs were monitored continuously by radiology nursing throughout the procedure. Sedation Time: 39 minutes MEDICATIONS: Patient Unasyn intravenously prior to the attempted ERCP. No additional antibiotics were administered. FLUOROSCOPY TIME:  13 minutes 6 seconds for a total of 126 mGy CONTRAST:  97mL OMNIPAQUE IOHEXOL 300 MG/ML  SOLN TECHNIQUE: Informed consent was obtained from the patient following explanation of the procedure, risks, benefits and  alternatives. The patient understands, agrees and consents for the procedure. All questions were addressed. A time out was performed. Maximal barrier sterile technique utilized including caps, mask, sterile gowns, sterile gloves, large sterile drape, hand hygiene, and Betadine skin prep. The upper abdomen was interrogated with ultrasound. There is a very ductal dilatation is visible by sonography. Local anesthesia was attained by infiltration with 1% lidocaine. A small dermatotomy was made. Under real-time sonographic guidance, a peripheral branch of the left segment 3 biliary tree was punctured with a [place] needle. Puncture was confirmed by return of bile from the needle as well as a gentle hand injection of contrast material under fluoroscopy. A night tracks wire was then carefully advanced into the biliary tree and coiled within the common bile duct. The needle was then exchanged for the Accustick sheath. Through the Accustick sheath a percutaneous transhepatic cholangiogram was performed. There is significant intra and extrahepatic biliary ductal dilatation. There is complete smooth occlusion of the distal common bile duct. No significant irregularity or evidence of choledocholithiasis. The cystic duct is patent. There is opacifications of the gallbladder lumen. Using a 4 [place] glide catheter and hydrophilic wire coaxially through the Accustick sheath, the wire and catheter were successfully navigated through the occlusion into the duodenum. Of note, the occlusion is very tight and crossing the lesion was difficult. A superstiff Amplatz wire was advanced through the Accustick sheath and coiled in the duodenum. The skin tract was then dilated and a 7 [place] cook flexor sheath advanced over the wire and positioned at the distal common bile duct. The Cook flexible alligator forceps were then advanced coaxially through the sheath next to the safety wire and several biopsy specimens were obtained from the  distal common bile duct occlusion. The specimens were placed in formalin and sent to pathology for further analysis. The skin tract was further dilated and a [place] biliary drainage catheter was advanced over the wire and formed with the locking loop in the duodenum. The proximal side hole is in the left segment 3 ductal system. The catheter was connected to gravity bag drainage and secured to the skin with 0 Prolene suture. The patient tolerated the procedure well. COMPLICATIONS: None Estimated blood loss:  0 mL IMPRESSION: 1. Percutaneous transhepatic cholangiogram demonstrates complete occlusion of the distal common bile duct. The occlusion appears smooth and is not particularly irregular. Differential considerations include both benign and malignant stricture. 2. Difficulty but ultimately successful crossing of the distal common bile duct occlusion. 3. Transhepatic alligator forceps biopsy of the distal common bile duct occlusion. 4. Placement of a 10 French internal/external biliary drainage catheter. PLAN: 1. Maintain tube to external gravity drainage until bilirubin has significantly trended down. At that point, since this is an internal/external drainage catheter the tube can be capped. 2. Monitor electrolytes and fluid balance and hydrate and replete electrolytes as needed while externally draining. 3. Pathology results are pending.  4. Recommend return to interventional radiology in approximately 4 weeks 4 placement of an internal WalFlex stent. Signed, Criselda Peaches, MD [facility] Radiology Electronically Signed   By: Jacqulynn Cadet M.D.   On: 08/09/2015 07:45   Ir Endoluminal Bx Of Biliary Tree  08/09/2015  CLINICAL DATA:  81 year old female with obstructed jaundice and severe hyperbilirubinemia. ERCP was unsuccessful secondary to high-grade stenosis in the distal common bile duct. Patient presents for percutaneous transhepatic  cholangiography with possible biliary duct biopsy and placement of a biliary drain EXAM: IR INT-EXT BILIARY DRAIN W/ CHOLANGIOGRAM; IR ENDOLUMINAL BIOPSY OF BILIARY TREE Date: 08/09/2015 PROCEDURE: 1. Ultrasound-guided puncture of a peripheral left biliary duct 2. Percutaneous transhepatic cholangiogram 3. Successful crossing of distal common bile duct occlusion 4. Alligator forceps biopsy of distal common bile duct 5. Placement of an internal/external biliary drainage catheter Interventional Radiologist:  Criselda Peaches, MD ANESTHESIA/SEDATION: Moderate (conscious) sedation was used. 4 mg Versed, 100 mcg Fentanyl were administered intravenously. The patient's vital signs were monitored continuously by radiology nursing throughout the procedure. Sedation Time: 39 minutes MEDICATIONS: Patient Unasyn intravenously prior to the attempted ERCP. No additional antibiotics were administered. FLUOROSCOPY TIME:  13 minutes 6 seconds for a total of 126 mGy CONTRAST:  74mL OMNIPAQUE IOHEXOL 300 MG/ML  SOLN TECHNIQUE: Informed consent was obtained from the patient following explanation of the procedure, risks, benefits and alternatives. The patient understands, agrees and consents for the procedure. All questions were addressed. A time out was performed. Maximal barrier sterile technique utilized including caps, mask, sterile gowns, sterile gloves, large sterile drape, hand hygiene, and Betadine skin prep. The upper abdomen was interrogated with ultrasound. There is a very ductal dilatation is visible by sonography. Local anesthesia was attained by infiltration with 1% lidocaine. A small dermatotomy was made. Under real-time sonographic guidance, a peripheral branch of the left segment 3 biliary tree was punctured with a [place] needle. Puncture was confirmed by return of bile from the needle as well as a gentle hand injection of contrast material under fluoroscopy. A night tracks wire was then carefully advanced  into the biliary tree and coiled within the common bile duct. The needle was then exchanged for the Accustick sheath. Through the Accustick sheath a percutaneous transhepatic cholangiogram was performed. There is significant intra and extrahepatic biliary ductal dilatation. There is complete smooth occlusion of the distal common bile duct. No significant irregularity or evidence of choledocholithiasis. The cystic duct is patent. There is opacifications of the gallbladder lumen. Using a 4 [place] glide catheter and hydrophilic wire coaxially through the Accustick sheath, the wire and catheter were successfully navigated through the occlusion into the duodenum. Of note, the occlusion is very tight and crossing the lesion was difficult. A superstiff Amplatz wire was advanced through the Accustick sheath and coiled in the duodenum. The skin tract was then dilated and a 7 [place] cook flexor sheath advanced over the wire and positioned at the distal common bile duct. The Cook flexible alligator forceps were then advanced coaxially through the sheath next to the safety wire and several biopsy specimens were obtained from the distal common bile duct occlusion. The specimens were placed in formalin and sent to pathology for further analysis. The skin tract was further dilated and a [place] biliary drainage catheter was advanced over the wire and formed with the locking loop in the duodenum. The proximal side hole is in the left segment 3 ductal system. The catheter  was connected to gravity bag drainage and secured to the skin with 0 Prolene suture. The patient tolerated the procedure well. COMPLICATIONS: None Estimated blood loss:  0 mL IMPRESSION: 1. Percutaneous transhepatic cholangiogram demonstrates complete occlusion of the distal common bile duct. The occlusion appears smooth and is not particularly irregular. Differential considerations include both benign and malignant stricture. 2. Difficulty but ultimately  successful crossing of the distal common bile duct occlusion. 3. Transhepatic alligator forceps biopsy of the distal common bile duct occlusion. 4. Placement of a 10 French internal/external biliary drainage catheter. PLAN: 1. Maintain tube to external gravity drainage until bilirubin has significantly trended down. At that point, since this is an internal/external drainage catheter the tube can be capped. 2. Monitor electrolytes and fluid balance and hydrate and replete electrolytes as needed while externally draining. 3. Pathology results are pending. 4. Recommend return to interventional radiology in approximately 4 weeks 4 placement of an internal WalFlex stent. Signed, Criselda Peaches, MD [facility] Radiology Electronically Signed   By: Jacqulynn Cadet M.D.   On: 08/09/2015 07:45   Ir Removal Of Calculi/debris Biliary Duct/gb  08/10/2015  CLINICAL DATA:  81 year old female with obstructed jaundice 2 days status post percutaneous transhepatic cholangiogram, distal bile duct obstruction crossing with biopsy and placement of an internal/external biliary drain. Patient was ready for discharge today before she began to develop right upper quadrant pain, and decreased drain output with some evidence of hemobilia. EXAM: IR REMOVE CALCULI;/DEBRIS BILIARY DUCT/GB; CHOLANGIOGRAM VIA EXISTING CATHETER Date: 08/10/2015 PROCEDURE: 1. Cholangiogram through existing tube 2. Suction aspiration and mechanical thrombectomy of the partially occluded biliary tube and common bile duct Interventional Radiologist:  Criselda Peaches, MD ANESTHESIA/SEDATION: None required MEDICATIONS: None FLUOROSCOPY TIME:  4 minutes 42 seconds for a total of 37 mGy CONTRAST:  66mL OMNIPAQUE IOHEXOL 300 MG/ML  SOLN TECHNIQUE: Informed consent was obtained from the patient following explanation of the procedure, risks, benefits and alternatives. The patient understands, agrees and consents  for the procedure. All questions were addressed. A time out was performed. Maximal barrier sterile technique utilized including caps, mask, sterile gowns, sterile gloves, large sterile drape, hand hygiene, and Betadine skin prep. An initial hand injection of contrast material opacifies the left and right intrahepatic ducts is well as the common hepatic duct, gallbladder and common bile duct up to the point of occlusion. A linear tube like filling defect within the common hepatic duct is most consistent with thrombus. The internal/external biliary drainage catheter is completely occluded beginning in the region of the thrombus. Using a stiff glidewire, the glidewire was carefully advanced through the tube and into the duodenum. The Glidewire was then manipulated vigorously to perform mechanical breakdown of the thrombus. Next, a 4 French straight glide catheter was advanced over the Glidewire and positioned at the distal tip of the biliary tube in the duodenum. Suction thrombectomy was then performed throughout the occluded segment of the tube followed by forceful injection through the glide catheter into the distal portion of the tube. Aspiration yielded a significant amount of small fragmented thrombus particles and debris. Repeat cholangiogram through the existing tube confirms complete patency of the tube with contrast material filling the duodenum. The tube was gently flushed with saline and reconnected to gravity bag drainage. The patient tolerated the procedure well. COMPLICATIONS: None IMPRESSION: 1. Partially occluded internal/external biliary drainage catheter secondary to thrombus within the common bile duct and tube. 2. Successful mechanical thrombectomy and debris removal with restoration  of tube patency. PLAN: Maintain tube to gravity drainage. I see no reason why discharge cannot be completed today. If the patient has additional problems with her tube over the holiday weekend she should call the  interventional radiologist on call. Signed, Criselda Peaches, MD [facility] Radiology Electronically Signed   By: Jacqulynn Cadet M.D.   On: 08/10/2015 11:51    Microbiology: No results found for this or any previous visit (from the past 240 hour(s)).   Labs: Basic Metabolic Panel:  Recent Labs Lab 08/14/15 1030 08/14/15 1800 08/15/15 0454 08/15/15 0500 08/15/15 1816 08/16/15 0433  NA 131* 133*  --  133* 134* 131*  K 3.8 4.3  --  5.1 4.3 4.5  CL 98* 99*  --  102 100* 101  CO2 24 26  --  23 23 22   GLUCOSE 109* 120*  --  98 116* 100*  BUN 24* 21*  --  15 14 10   CREATININE 0.83 0.66  --  0.67 0.56 0.52  CALCIUM 8.8* 8.4*  --  8.6* 8.1* 8.4*  MG  --   --  2.1  --   --   --    Liver Function Tests:  Recent Labs Lab 08/09/15 1345 08/10/15 0526 08/13/15 0802 08/15/15 0454 08/16/15 0433  AST 140* 82* 58* 62* 59*  ALT 256* 187* 123* 97* 92*  ALKPHOS 624* 513* 513* 427* 393*  BILITOT 11.0* 10.8* 10.9* 7.5* 5.6*  PROT 6.7 6.0* 7.3 6.1* 5.5*  ALBUMIN 3.6 3.2* 4.1 2.9* 2.7*   No results for input(s): LIPASE, AMYLASE in the last 168 hours. No results for input(s): AMMONIA in the last 168 hours. CBC:  Recent Labs Lab 08/13/15 1112 08/13/15 1719 08/14/15 0505 08/15/15 0454 08/16/15 0433  WBC 8.0 7.6 7.4 7.1 7.1  HGB 11.6* 9.9* 8.7* 9.2* 8.9*  HCT 33.7* 28.8* 24.9* 27.4* 26.7*  MCV 94.9 92.9 93.3 95.1 97.1  PLT 376 361 291 320 312   Cardiac Enzymes: No results for input(s): CKTOTAL, CKMB, CKMBINDEX, TROPONINI in the last 168 hours. BNP: BNP (last 3 results) No results for input(s): BNP in the last 8760 hours.  ProBNP (last 3 results) No results for input(s): PROBNP in the last 8760 hours.  CBG:  Recent Labs Lab 08/10/15 0738  GLUCAP 95       Signed:  Conny Situ MD, PhD  [facility] 08/16/2015, 11:32 AM

## 2015-08-16 NOTE — Care Management Important Message (Signed)
Important Message  Patient Details  Name: Summer Jones MRN: [id number] Date of Birth: [birth year]   Medicare Important Message Given:  Yes    Camillo Flaming 08/16/2015, 11:24 AMImportant Message  Patient Details  Name: Summer Jones MRN: [id number] Date of Birth: 1929-11-02   Medicare Important Message Given:  Yes    Camillo Flaming 08/16/2015, 11:24 AM

## 2015-08-16 NOTE — Progress Notes (Signed)
Subjective: Patient denies any abdominal pain, denies any drain site leaking or fevers.   Allergies: Sulfamethoxazole  Medications: Prior to Admission medications   Medication Sig Start Date End Date Taking? Authorizing Provider  amLODipine (NORVASC) 10 MG tablet Take 10 mg by mouth daily.  06/16/15  Yes Historical Provider, MD  hydroxypropyl methylcellulose / hypromellose (ISOPTO TEARS / GONIOVISC) 2.5 % ophthalmic solution Place 2 drops into both eyes 3 (three) times daily as needed for dry eyes.   Yes Historical Provider, MD  levothyroxine (SYNTHROID, LEVOTHROID) 25 MCG tablet Take 25 mcg by mouth daily before breakfast.   Yes Historical Provider, MD  lisinopril (PRINIVIL,ZESTRIL) 5 MG tablet Take 5 mg by mouth daily.   Yes Historical Provider, MD  Multiple Vitamins-Minerals (MULTIVITAMIN GUMMIES ADULT) CHEW Chew 1 tablet by mouth daily.   Yes Historical Provider, MD  polyethylene glycol powder (GLYCOLAX/MIRALAX) powder Take 17 g by mouth daily.   Yes Historical Provider, MD  sodium chloride (OCEAN) 0.65 % SOLN nasal spray Place 1 spray into both nostrils as needed for congestion.   Yes Historical Provider, MD  feeding supplement, ENSURE ENLIVE, (ENSURE ENLIVE) LIQD Take 237 mLs by mouth 2 (two) times daily between meals. 08/16/15   Florencia Reasons, MD  food thickener ([place]) POWD Use with 1with every meal and at bedtime 08/13/15   Mauri Pole, MD  oxyCODONE (OXY IR/ROXICODONE) 5 MG immediate release tablet Take 1 tablet (5 mg total) by mouth every 4 (four) hours as needed for severe pain. 08/09/15   Donne Hazel, MD  sodium chloride 1 g tablet Take 1 tablet (1 g total) by mouth 2 (two) times daily with a meal. 08/16/15   Florencia Reasons, MD    Vital Signs: BP 136/62 mmHg  Pulse 71  Temp(Src) 98.1 F (36.7 C) (Oral)  Resp 16  Ht 5' (1.524 m)  Wt 87 lb 6 oz (39.633 kg)  BMI 17.06 kg/m2  SpO2 98%  Physical Exam  General: A&Ox3, NAD Abd: Soft, NT, distended,  biliary drain intact and capped, no leaking on bandage  Imaging: No results found.  Labs:  CBC:  Recent Labs  08/13/15 1719 08/14/15 0505 08/15/15 0454 08/16/15 0433  WBC 7.6 7.4 7.1 7.1  HGB 9.9* 8.7* 9.2* 8.9*  HCT 28.8* 24.9* 27.4* 26.7*  PLT 361 291 320 312    COAGS:  Recent Labs  08/06/15 1429  INR 0.95  APTT 27    BMP:  Recent Labs  08/14/15 1800 08/15/15 0500 08/15/15 1816 08/16/15 0433  NA 133* 133* 134* 131*  K 4.3 5.1 4.3 4.5  CL 99* 102 100* 101  CO2 26 23 23 22   GLUCOSE 120* 98 116* 100*  BUN 21* 15 14 10   CALCIUM 8.4* 8.6* 8.1* 8.4*  CREATININE 0.66 0.67 0.56 0.52  GFRNONAA >60 >60 >60 >60  GFRAA >60 >60 >60 >60    LIVER FUNCTION TESTS:  Recent Labs  08/10/15 0526 08/13/15 0802 08/15/15 0454 08/16/15 0433  BILITOT 10.8* 10.9* 7.5* 5.6*  AST 82* 58* 62* 59*  ALT 187* 123* 97* 92*  ALKPHOS 513* 513* 427* 393*  PROT 6.0* 7.3 6.1* 5.5*  ALBUMIN 3.2* 4.1 2.9* 2.7*    Assessment and Plan: Biliary obstruction s/p internal/external biliary drain placed and forceps biopsy 12/22 done that revealed no malignancy  Partially occluded biliary drain secondary to thrombus s/p mechanical thrombectomy and debris removal 08/10/15- biliary tube to gravity post procedure cholangiogram revealed patency  Admitted with dehydration/hyponatremia 08/13/15  Biliary drain capped 12/28, T. Bili 5.6 (7.5), LFT's improving, wbc wnl, afebrile  Case discussed with Dr. Kathlene Cote and Nicoletta Ba PA-C, Dr. [name] does not feel EUS would be possible given patient's anatomy and previously difficulty and would recommend repeat forceps biopsy. Per Dr. Kathlene Cote IR will schedule for repeat Forceps biopsy and biliary drain exchange Tuesday 08/20/2015   Signed: Tsosie Billing D 08/16/2015, 11:40 AM   I spent a total of 15 Minutes at the the patient's bedside AND on the patient's hospital floor or unit, greater than 50% of which was counseling/coordinating care for  biliary obstruction

## 2015-08-16 NOTE — Progress Notes (Signed)
DC instructions reviewed with the patient and her husband at the bedside,all questions and concerns were addressed. Pt is alert and oriented times four, VSS, skin intact, biliary drain remains in place and clamped. Pt to follow up on January 3 at 10am for repeat biopsy in radiology. All personal belongings sent with the patient. Pt DC home.

## 2015-08-16 NOTE — Care Management Note (Signed)
Case Management Note  Patient Details  Name: Summer Jones MRN: [id number] Date of Birth: October 01, 1929  Subjective/Objective:    Admitted with CBD stricture and radiology was consulted for PTCA of biliary decompression                Action/Plan: Discharge planning, no HH needs identified. Tube capped and OP f/u planned.  Expected Discharge Date:   (unknown)               Expected Discharge Plan:  [place]  In-House Referral:  NA  Discharge planning Services  CM Consult  Post Acute Care Choice:  Home Health Choice offered to:  Patient  DME Arranged:  N/A DME Agency:  NA  HH Arranged:  NA HH Agency:  NA  Status of Service:  Completed, signed off  Medicare Important Message Given:  Yes Date Medicare IM Given:    Medicare IM give by:    Date Additional Medicare IM Given:    Additional Medicare Important Message give by:     If discussed at [place] of Stay Meetings, dates discussed:    Additional Comments:  Mychaela, Krukowski, RN 08/16/2015, 1:15 PM

## 2015-08-20 ENCOUNTER — Ambulatory Visit (HOSPITAL_COMMUNITY)
Admission: RE | Admit: 2015-08-20 | Discharge: 2015-08-20 | Disposition: A | Discharge status: Home / Self Care | Payer: Medicare Other | Source: Ambulatory Visit | Attending: Radiology | Admitting: Radiology

## 2015-08-20 ENCOUNTER — Encounter (HOSPITAL_COMMUNITY): Payer: Self-pay

## 2015-08-20 ENCOUNTER — Ambulatory Visit (HOSPITAL_COMMUNITY)
Admit: 2015-08-20 | Discharge: 2015-08-20 | Disposition: A | Discharge status: Home / Self Care | Payer: Medicare Other | Source: Ambulatory Visit | Attending: Radiology | Admitting: Radiology

## 2015-08-20 DIAGNOSIS — E860 Dehydration: Secondary | ICD-10-CM | POA: Insufficient documentation

## 2015-08-20 DIAGNOSIS — K831 Obstruction of bile duct: Secondary | ICD-10-CM | POA: Diagnosis not present

## 2015-08-20 DIAGNOSIS — E871 Hypo-osmolality and hyponatremia: Secondary | ICD-10-CM | POA: Diagnosis not present

## 2015-08-20 DIAGNOSIS — R627 Adult failure to thrive: Secondary | ICD-10-CM

## 2015-08-20 HISTORY — DX: Hypothyroidism, unspecified: E03.9

## 2015-08-20 LAB — CBC WITH DIFFERENTIAL/PLATELET
BASOS ABS: 0 10*3/uL (ref 0.0–0.1)
BASOS PCT: 0 %
EOS ABS: 0.1 10*3/uL (ref 0.0–0.7)
Eosinophils Relative: 2 %
HCT: 32 % — ABNORMAL LOW (ref 36.0–46.0)
HEMOGLOBIN: 10.4 g/dL — AB (ref 12.0–15.0)
LYMPHS ABS: 0.6 10*3/uL — AB (ref 0.7–4.0)
Lymphocytes Relative: 9 %
MCH: 31.9 pg (ref 26.0–34.0)
MCHC: 32.5 g/dL (ref 30.0–36.0)
MCV: 98.2 fL (ref 78.0–100.0)
Monocytes Absolute: 0.7 10*3/uL (ref 0.1–1.0)
Monocytes Relative: 11 %
NEUTROS PCT: 78 %
Neutro Abs: 5.2 10*3/uL (ref 1.7–7.7)
PLATELETS: 548 10*3/uL — AB (ref 150–400)
RBC: 3.26 MIL/uL — AB (ref 3.87–5.11)
RDW: 14.5 % (ref 11.5–15.5)
WBC: 6.7 10*3/uL (ref 4.0–10.5)

## 2015-08-20 LAB — COMPREHENSIVE METABOLIC PANEL
ALBUMIN: 3.6 g/dL (ref 3.5–5.0)
ALK PHOS: 320 U/L — AB (ref 38–126)
ALT: 82 U/L — AB (ref 14–54)
AST: 51 U/L — ABNORMAL HIGH (ref 15–41)
Anion gap: 10 (ref 5–15)
BUN: 10 mg/dL (ref 6–20)
CALCIUM: 9.6 mg/dL (ref 8.9–10.3)
CHLORIDE: 98 mmol/L — AB (ref 101–111)
CO2: 27 mmol/L (ref 22–32)
CREATININE: 0.65 mg/dL (ref 0.44–1.00)
GFR calc Af Amer: >60 mL/min (ref >60)
GFR calc non Af Amer: >60 mL/min (ref >60)
GLUCOSE: 112 mg/dL — AB (ref 65–99)
Potassium: 4.1 mmol/L (ref 3.5–5.1)
SODIUM: 135 mmol/L (ref 135–145)
TOTAL PROTEIN: 7.2 g/dL (ref 6.5–8.1)
Total Bilirubin: 5 mg/dL — ABNORMAL HIGH (ref 0.3–1.2)

## 2015-08-20 LAB — PROTIME-INR
INR: 0.97 (ref 0.00–1.49)
Prothrombin Time: 13.1 seconds (ref 11.6–15.2)

## 2015-08-20 MED ORDER — FENTANYL CITRATE (PF) 100 MCG/2ML IJ SOLN
INTRAMUSCULAR | Status: AC | PRN
  Administered 2015-08-20 (×2): 25 ug via INTRAVENOUS

## 2015-08-20 MED ORDER — LIDOCAINE-EPINEPHRINE 2 %-1:100000 IJ SOLN
INTRAMUSCULAR | Status: AC
  Filled 2015-08-20: qty 1

## 2015-08-20 MED ORDER — MIDAZOLAM HCL 2 MG/2ML IJ SOLN
INTRAMUSCULAR | Status: AC
  Filled 2015-08-20: qty 4

## 2015-08-20 MED ORDER — MIDAZOLAM HCL 2 MG/2ML IJ SOLN
INTRAMUSCULAR | Status: AC | PRN
  Administered 2015-08-20: 0.5 mg via INTRAVENOUS

## 2015-08-20 MED ORDER — IOHEXOL 300 MG/ML  SOLN
20.0000 mL | Freq: Once | INTRAMUSCULAR | Status: AC | PRN
  Administered 2015-08-20: 20 mL via INTRAVENOUS

## 2015-08-20 MED ORDER — FENTANYL CITRATE (PF) 100 MCG/2ML IJ SOLN
INTRAMUSCULAR | Status: AC
  Filled 2015-08-20: qty 2

## 2015-08-20 MED ORDER — PIPERACILLIN SOD-TAZOBACTAM SO 2.25 (2-0.25) G IV SOLR
3.3750 g | Freq: Once | INTRAVENOUS | Status: AC
  Administered 2015-08-20: 3.375 g via INTRAVENOUS
  Filled 2015-08-20: qty 3.38

## 2015-08-20 MED ORDER — SODIUM CHLORIDE 0.9 % IV SOLN
INTRAVENOUS | Status: DC
  Administered 2015-08-20: 11:00:00 via INTRAVENOUS

## 2015-08-20 NOTE — Procedures (Signed)
Technically successful fluoroscopic guided brush biopsy of moderate to long segment stricture of the CBD. Technically successful fluoroscopic guided biliary drain exchange. No immediate post procedural complications.   Ronny Bacon, MD Pager #: [phone]

## 2015-08-20 NOTE — Discharge Instructions (Signed)
Quiet rest  remainder of day. No change in diet or medications. Dressing changes as directed by doctor call for any problems or questions [facility] [phone]. Use drainage bag as directed by doctor.     Moderate Conscious Sedation, Adult Sedation is the use of medicines to promote relaxation and relieve discomfort and anxiety. Moderate conscious sedation is a type of sedation. Under moderate conscious sedation you are less alert than normal but are still able to respond to instructions or stimulation. Moderate conscious sedation is used during short medical and dental procedures. It is milder than deep sedation or general anesthesia and allows you to return to your regular activities sooner. LET [facility] CARE PROVIDER KNOW ABOUT:   Any allergies you have.  All medicines you are taking, including vitamins, herbs, eye drops, creams, and over-the-counter medicines.  Use of steroids (by mouth or creams).  Previous problems you or members of your family have had with the use of anesthetics.  Any blood disorders you have.  Previous surgeries you have had.  Medical conditions you have.  Possibility of pregnancy, if this applies.  Use of cigarettes, alcohol, or illegal drugs. RISKS AND COMPLICATIONS Generally, this is a safe procedure. However, as with any procedure, problems can occur. Possible problems include:  Oversedation.  Trouble breathing on your own. You may need to have a breathing tube until you are awake and breathing on your own.  Allergic reaction to any of the medicines used for the procedure. BEFORE THE PROCEDURE  You may have blood tests done. These tests can help show how well your kidneys and liver are working. They can also show how well your blood clots.  A physical exam will be done.  Only take medicines as directed by your health care provider. You may need to stop taking medicines (such as blood thinners, aspirin, or nonsteroidal  anti-inflammatory drugs) before the procedure.   Do not eat or drink at least 6 hours before the procedure or as directed by your health care provider.  Arrange for a responsible adult, family member, or friend to take you home after the procedure. He or she should stay with you for at least 24 hours after the procedure, until the medicine has worn off. PROCEDURE   An intravenous (IV) catheter will be inserted into one of your veins. Medicine will be able to flow directly into your body through this catheter. You may be given medicine through this tube to help prevent pain and help you relax.  The medical or dental procedure will be done. AFTER THE PROCEDURE  You will stay in a recovery area until the medicine has worn off. Your blood pressure and pulse will be checked.   Depending on the procedure you had, you may be allowed to go home when you can tolerate liquids and your pain is under control.   This information is not intended to replace advice given to you by your health care provider. Make sure you discuss any questions you have with your health care provider.   Document Released: 04/28/2001 Document Revised: 08/24/2014 Document Reviewed: 04/10/2013 Elsevier Interactive Patient Education [employer].

## 2015-08-20 NOTE — Progress Notes (Signed)
Patient ID: Summer Jones, female   DOB: 1929-11-16, 82 y.o.   MRN: [id number]          Subjective: Patient is familiar to IR service from prior placement of an internal/external biliary drain and forceps biopsy on 12/22 secondary to common bile duct obstruction. Pathology from the forceps biopsy revealed no evidence of malignancy. Patient was recently admitted to the hospital on 12/27 secondary to dehydration and hyponatremia. Her biliary drain was capped on 12/28. She was discharged home on 12/30. She has done well since discharge with no fevers,  significant abdominal or back pain, nausea/ vomiting or abnormal bleeding. She presents today for biliary drain exchange and repeat forceps biopsy of the common bile duct strictured region.   Allergies: Sulfamethoxazole  Medications: Prior to Admission medications   Medication Sig Start Date End Date Taking? Authorizing Provider  amLODipine (NORVASC) 10 MG tablet Take 10 mg by mouth daily.  06/16/15  Yes Historical Provider, MD  feeding supplement, ENSURE ENLIVE, (ENSURE ENLIVE) LIQD Take 237 mLs by mouth 2 (two) times daily between meals. 08/16/15  Yes Florencia Reasons, MD  hydroxypropyl methylcellulose / hypromellose (ISOPTO TEARS / GONIOVISC) 2.5 % ophthalmic solution Place 2 drops into both eyes 3 (three) times daily as needed for dry eyes.   Yes Historical Provider, MD  levothyroxine (SYNTHROID, LEVOTHROID) 25 MCG tablet Take 25 mcg by mouth daily before breakfast.   Yes Historical Provider, MD  lisinopril (PRINIVIL,ZESTRIL) 5 MG tablet Take 5 mg by mouth daily.   Yes Historical Provider, MD  Multiple Vitamins-Minerals (MULTIVITAMIN GUMMIES ADULT) CHEW Chew 1 tablet by mouth daily.   Yes Historical Provider, MD  sodium chloride 1 g tablet Take 1 tablet (1 g total) by mouth 2 (two) times daily with a meal. 08/16/15  Yes Florencia Reasons, MD  polyethylene glycol powder (GLYCOLAX/MIRALAX) powder Take 17 g by mouth daily.    Historical Provider, MD  sodium  chloride (OCEAN) 0.65 % SOLN nasal spray Place 1 spray into both nostrils as needed for congestion.    Historical Provider, MD     Vital Signs: BP 128/97 mmHg  Pulse 73  Temp(Src) 97.5 F (36.4 C) (Oral)  Resp 14  SpO2 100%  Physical Exam patient is awake, alert. Mild scleral icterus. Chest clear to auscultation bilaterally. Heart with regular rate and rhythm. Biliary drain intact and capped, insertion site okay, minimal tenderness. Abdomen soft, bowel sounds; extremities with trace pretibial edema bilaterally.  Imaging: No results found.  Labs:  CBC:  Recent Labs  08/14/15 0505 08/15/15 0454 08/16/15 0433 08/20/15 1030  WBC 7.4 7.1 7.1 6.7  HGB 8.7* 9.2* 8.9* 10.4*  HCT 24.9* 27.4* 26.7* 32.0*  PLT 291 320 312 548*    COAGS:  Recent Labs  08/06/15 1429 08/20/15 1030  INR 0.95 0.97  APTT 27  --     BMP:  Recent Labs  08/15/15 0500 08/15/15 1816 08/16/15 0433 08/20/15 1030  NA 133* 134* 131* 135  K 5.1 4.3 4.5 4.1  CL 102 100* 101 98*  CO2 23 23 22 27   GLUCOSE 98 116* 100* 112*  BUN 15 14 10 10   CALCIUM 8.6* 8.1* 8.4* 9.6  CREATININE 0.67 0.56 0.52 0.65  GFRNONAA >60 >60 >60 >60  GFRAA >60 >60 >60 >60    LIVER FUNCTION TESTS:  Recent Labs  08/13/15 0802 08/15/15 0454 08/16/15 0433 08/20/15 1030  BILITOT 10.9* 7.5* 5.6* 5.0*  AST 58* 62* 59* 51*  ALT 123* 97* 92* 82*  ALKPHOS 513* 427* 393* 320*  PROT 7.3 6.1* 5.5* 7.2  ALBUMIN 4.1 2.9* 2.7* 3.6    Assessment and Plan: Pt with prior placement of an internal/external biliary drain and forceps biopsy on 12/22 secondary to common bile duct obstruction. Pathology from the forceps biopsy revealed no evidence of malignancy. Patient was recently admitted to the hospital on 12/27 secondary to dehydration and hyponatremia. Her biliary drain was capped on 12/28. She was discharged home on 12/30. She has done well since discharge with no fevers,  significant abdominal or back pain, nausea/ vomiting  or abnormal bleeding. She presents today for biliary drain exchange and repeat forceps biopsy of the common bile duct strictured region. Details/risks of procedures, including but not limited to, internal bleeding, infection discussed with patient and family with their understanding and consent.    Signed: D. Lennette Bihari Allred 08/20/2015, 11:35 AM   15 minutes spent during exam/prep for above procedure

## 2015-08-26 ENCOUNTER — Ambulatory Visit (HOSPITAL_COMMUNITY): Payer: Medicare Other

## 2015-08-28 ENCOUNTER — Other Ambulatory Visit: Payer: Self-pay

## 2015-08-28 DIAGNOSIS — K8061 Calculus of gallbladder and bile duct with cholecystitis, unspecified, with obstruction: Secondary | ICD-10-CM

## 2015-08-28 DIAGNOSIS — R634 Abnormal weight loss: Secondary | ICD-10-CM

## 2015-08-28 DIAGNOSIS — R17 Unspecified jaundice: Secondary | ICD-10-CM

## 2015-09-05 ENCOUNTER — Emergency Department (HOSPITAL_COMMUNITY): Payer: Medicare Other

## 2015-09-05 ENCOUNTER — Other Ambulatory Visit (HOSPITAL_COMMUNITY): Payer: Self-pay | Admitting: Interventional Radiology

## 2015-09-05 ENCOUNTER — Other Ambulatory Visit: Payer: Self-pay | Admitting: Radiology

## 2015-09-05 ENCOUNTER — Ambulatory Visit (HOSPITAL_COMMUNITY)
Admission: RE | Admit: 2015-09-05 | Discharge: 2015-09-05 | Disposition: A | Discharge status: Home / Self Care | Payer: Medicare Other | Source: Ambulatory Visit | Attending: Radiology | Admitting: Radiology

## 2015-09-05 ENCOUNTER — Ambulatory Visit (HOSPITAL_COMMUNITY)
Admission: RE | Admit: 2015-09-05 | Discharge: 2015-09-05 | Disposition: A | Discharge status: Home / Self Care | Payer: Medicare Other | Source: Ambulatory Visit | Attending: Interventional Radiology | Admitting: Interventional Radiology

## 2015-09-05 ENCOUNTER — Emergency Department (HOSPITAL_COMMUNITY)
Admission: EM | Admit: 2015-09-05 | Discharge: 2015-09-06 | Disposition: A | Discharge status: Home / Self Care | Payer: Medicare Other | Attending: Emergency Medicine | Admitting: Emergency Medicine

## 2015-09-05 ENCOUNTER — Encounter (HOSPITAL_COMMUNITY): Payer: Self-pay

## 2015-09-05 ENCOUNTER — Encounter (HOSPITAL_COMMUNITY): Payer: Self-pay | Admitting: Emergency Medicine

## 2015-09-05 DIAGNOSIS — M1990 Unspecified osteoarthritis, unspecified site: Secondary | ICD-10-CM | POA: Insufficient documentation

## 2015-09-05 DIAGNOSIS — E039 Hypothyroidism, unspecified: Secondary | ICD-10-CM | POA: Diagnosis not present

## 2015-09-05 DIAGNOSIS — R634 Abnormal weight loss: Secondary | ICD-10-CM

## 2015-09-05 DIAGNOSIS — Z8719 Personal history of other diseases of the digestive system: Secondary | ICD-10-CM | POA: Diagnosis not present

## 2015-09-05 DIAGNOSIS — Z4682 Encounter for fitting and adjustment of non-vascular catheter: Secondary | ICD-10-CM

## 2015-09-05 DIAGNOSIS — K831 Obstruction of bile duct: Secondary | ICD-10-CM

## 2015-09-05 DIAGNOSIS — I10 Essential (primary) hypertension: Secondary | ICD-10-CM | POA: Insufficient documentation

## 2015-09-05 DIAGNOSIS — Z9889 Other specified postprocedural states: Secondary | ICD-10-CM | POA: Diagnosis not present

## 2015-09-05 DIAGNOSIS — R1110 Vomiting, unspecified: Secondary | ICD-10-CM | POA: Insufficient documentation

## 2015-09-05 DIAGNOSIS — R1084 Generalized abdominal pain: Secondary | ICD-10-CM | POA: Diagnosis not present

## 2015-09-05 DIAGNOSIS — R17 Unspecified jaundice: Secondary | ICD-10-CM | POA: Insufficient documentation

## 2015-09-05 DIAGNOSIS — R109 Unspecified abdominal pain: Secondary | ICD-10-CM

## 2015-09-05 DIAGNOSIS — Z79899 Other long term (current) drug therapy: Secondary | ICD-10-CM | POA: Insufficient documentation

## 2015-09-05 DIAGNOSIS — K8061 Calculus of gallbladder and bile duct with cholecystitis, unspecified, with obstruction: Secondary | ICD-10-CM

## 2015-09-05 DIAGNOSIS — G8918 Other acute postprocedural pain: Secondary | ICD-10-CM | POA: Diagnosis present

## 2015-09-05 LAB — LIPASE, BLOOD: LIPASE: 62 U/L — AB (ref 11–51)

## 2015-09-05 LAB — COMPREHENSIVE METABOLIC PANEL
ALBUMIN: 4.5 g/dL (ref 3.5–5.0)
ALK PHOS: 167 U/L — AB (ref 38–126)
ALT: 31 U/L (ref 14–54)
ANION GAP: 12 (ref 5–15)
AST: 34 U/L (ref 15–41)
BILIRUBIN TOTAL: 2.1 mg/dL — AB (ref 0.3–1.2)
BUN: 8 mg/dL (ref 6–20)
CALCIUM: 10.1 mg/dL (ref 8.9–10.3)
CO2: 24 mmol/L (ref 22–32)
Chloride: 105 mmol/L (ref 101–111)
Creatinine, Ser: 0.69 mg/dL (ref 0.44–1.00)
GFR calc non Af Amer: >60 mL/min (ref >60)
GLUCOSE: 109 mg/dL — AB (ref 65–99)
POTASSIUM: 4.1 mmol/L (ref 3.5–5.1)
Sodium: 141 mmol/L (ref 135–145)
TOTAL PROTEIN: 7.5 g/dL (ref 6.5–8.1)

## 2015-09-05 LAB — CBC
HEMATOCRIT: 38.7 % (ref 36.0–46.0)
HEMATOCRIT: 38.6 % (ref 36.0–46.0)
HEMOGLOBIN: 12.9 g/dL (ref 12.0–15.0)
Hemoglobin: 12.4 g/dL (ref 12.0–15.0)
MCH: 31.7 pg (ref 26.0–34.0)
MCH: 32 pg (ref 26.0–34.0)
MCHC: 32 g/dL (ref 30.0–36.0)
MCHC: 33.4 g/dL (ref 30.0–36.0)
MCV: 99 fL (ref 78.0–100.0)
MCV: 95.8 fL (ref 78.0–100.0)
Platelets: 278 10*3/uL (ref 150–400)
Platelets: 242 10*3/uL (ref 150–400)
RBC: 3.91 MIL/uL (ref 3.87–5.11)
RBC: 4.03 MIL/uL (ref 3.87–5.11)
RDW: 13.9 % (ref 11.5–15.5)
RDW: 13.5 % (ref 11.5–15.5)
WBC: 5.4 10*3/uL (ref 4.0–10.5)
WBC: 10.2 10*3/uL (ref 4.0–10.5)

## 2015-09-05 LAB — PROTIME-INR
INR: 0.93 (ref 0.00–1.49)
PROTHROMBIN TIME: 12.7 s (ref 11.6–15.2)

## 2015-09-05 LAB — COMPREHENSIVE METABOLIC PANEL WITH GFR
ALT: 57 U/L — ABNORMAL HIGH (ref 14–54)
AST: 71 U/L — ABNORMAL HIGH (ref 15–41)
Albumin: 4.3 g/dL (ref 3.5–5.0)
Alkaline Phosphatase: 191 U/L — ABNORMAL HIGH (ref 38–126)
Anion gap: 13 (ref 5–15)
BUN: 10 mg/dL (ref 6–20)
CO2: 23 mmol/L (ref 22–32)
Calcium: 9.9 mg/dL (ref 8.9–10.3)
Chloride: 101 mmol/L (ref 101–111)
Creatinine, Ser: 0.65 mg/dL (ref 0.44–1.00)
GFR calc Af Amer: >60 mL/min
GFR calc non Af Amer: >60 mL/min
Glucose, Bld: 121 mg/dL — ABNORMAL HIGH (ref 65–99)
Potassium: 4.2 mmol/L (ref 3.5–5.1)
Sodium: 137 mmol/L (ref 135–145)
Total Bilirubin: 2.8 mg/dL — ABNORMAL HIGH (ref 0.3–1.2)
Total Protein: 7.7 g/dL (ref 6.5–8.1)

## 2015-09-05 MED ORDER — MIDAZOLAM HCL 2 MG/2ML IJ SOLN
INTRAMUSCULAR | Status: AC | PRN
  Administered 2015-09-05 (×6): 0.5 mg via INTRAVENOUS

## 2015-09-05 MED ORDER — FENTANYL CITRATE (PF) 100 MCG/2ML IJ SOLN
INTRAMUSCULAR | Status: AC
  Filled 2015-09-05: qty 2

## 2015-09-05 MED ORDER — LIDOCAINE HCL 1 % IJ SOLN
INTRAMUSCULAR | Status: AC
  Filled 2015-09-05: qty 20

## 2015-09-05 MED ORDER — SODIUM CHLORIDE 0.9 % IV SOLN
INTRAVENOUS | Status: DC
  Administered 2015-09-05: 10:00:00 via INTRAVENOUS

## 2015-09-05 MED ORDER — IOHEXOL 300 MG/ML  SOLN
25.0000 mL | Freq: Once | INTRAMUSCULAR | Status: AC | PRN
  Administered 2015-09-05: 25 mL

## 2015-09-05 MED ORDER — ONDANSETRON HCL 4 MG/2ML IJ SOLN
INTRAMUSCULAR | Status: AC
  Administered 2015-09-05: 4 mg
  Filled 2015-09-05: qty 2

## 2015-09-05 MED ORDER — MIDAZOLAM HCL 2 MG/2ML IJ SOLN
INTRAMUSCULAR | Status: AC
  Filled 2015-09-05: qty 2

## 2015-09-05 MED ORDER — IOHEXOL 300 MG/ML  SOLN
100.0000 mL | Freq: Once | INTRAMUSCULAR | Status: AC | PRN
  Administered 2015-09-05: 80 mL via INTRAVENOUS

## 2015-09-05 MED ORDER — PIPERACILLIN SOD-TAZOBACTAM SO 2.25 (2-0.25) G IV SOLR
3.3750 g | INTRAVENOUS | Status: DC
  Filled 2015-09-05: qty 3.38

## 2015-09-05 MED ORDER — SODIUM CHLORIDE 0.9 % IV BOLUS (SEPSIS)
500.0000 mL | Freq: Once | INTRAVENOUS | Status: AC
  Administered 2015-09-06: 500 mL via INTRAVENOUS

## 2015-09-05 MED ORDER — FENTANYL CITRATE (PF) 100 MCG/2ML IJ SOLN
INTRAMUSCULAR | Status: AC | PRN
  Administered 2015-09-05 (×6): 25 ug via INTRAVENOUS

## 2015-09-05 MED ORDER — FENTANYL CITRATE (PF) 100 MCG/2ML IJ SOLN
50.0000 ug | Freq: Once | INTRAMUSCULAR | Status: AC
  Administered 2015-09-06: 25 ug via INTRAVENOUS
  Filled 2015-09-05: qty 2

## 2015-09-05 MED ORDER — HYDROCODONE-ACETAMINOPHEN 5-325 MG PO TABS: 1.0000 | ORAL_TABLET | ORAL | Status: DC | PRN

## 2015-09-05 MED ORDER — IOHEXOL 300 MG/ML  SOLN
25.0000 mL | Freq: Once | INTRAMUSCULAR | Status: AC | PRN
  Administered 2015-09-05: 25 mL via ORAL

## 2015-09-05 NOTE — Sedation Documentation (Signed)
Awaiting lab results

## 2015-09-05 NOTE — Sedation Documentation (Signed)
C/o pain remedicated

## 2015-09-05 NOTE — Procedures (Signed)
Interventional Radiology Procedure Note  Procedure: 1.) Cholangiogram 2.) Brush biopsy CBD stricture x3 3.) Forceps biopsy CBD stricture x 9 4.) Placement of 10 x 60 mm WallFlex covered metal stent 5.) Placement of safety external biliary drain at intrahepatic confluence.  Tube left capped to trial effectiveness of stent.   Complications: None  Estimated Blood Loss: 0   Recommendations: - Path pending - Bedrest x 1 hour, then DC home  Signed,  Criselda Peaches, MD

## 2015-09-05 NOTE — ED Provider Notes (Signed)
CSN: [id number]     Arrival date & time 09/05/15  1856 History   First MD Initiated Contact with Patient 09/05/15 2206     Chief Complaint  Patient presents with  . Post-op Problem     (Consider location/radiation/quality/duration/timing/severity/associated sxs/prior Treatment) HPI Comments: The patient is an 82 year old female, she has a history of biliary stricture for which she has had an external biliary drain as well as a stent that crosses the stricture, this was replaced today during an interventional radiology procedure, she had a cholangiogram which redemonstrated that area that was approximately 2 cm long with an irregular stricture. The patient states that since the study she has had increased pain, she is now having some vomiting and increased distention of her abdomen. She denies fevers, diarrhea or any blood in her stools. The symptoms are persistent, gradually worsening, it was recommended by the radiologist that she come to the hospital for evaluation tonight.  The history is provided by the patient.    Past Medical History  Diagnosis Date  . Diverticular disease   . Arthritis   . Diverticulosis   . Hypertension   . Thyroid disease   . Mitral valve prolapse   . Difficult intubation Anterior glottis, short TMD, limited mouth opening. Required glidescope and bougie stylet with multiple attempts.  . Common bile duct (CBD) obstruction   . Jaundice   . Hypothyroidism    Past Surgical History  Procedure Laterality Date  . Inguinal hernia repair      right  . Breast biopsy    . Tonsillectomy    . Eus N/A 08/08/2015    Procedure: UPPER ENDOSCOPIC ULTRASOUND (EUS) RADIAL;  Surgeon: [name], MD;  Location: [facility];  Service: Endoscopy;  Laterality: N/A;  . Endoscopic retrograde cholangiopancreatography (ercp) with propofol N/A 08/08/2015    Procedure: ENDOSCOPIC RETROGRADE CHOLANGIOPANCREATOGRAPHY (ERCP) WITH PROPOFOL;  Surgeon: [name], MD;  Location:  [facility];  Service: Endoscopy;  Laterality: N/A;   Family History  Problem Relation Age of Onset  . Breast cancer Sister   . Heart disease Father    Social History  Substance Use Topics  . Smoking status: Never Smoker   . Smokeless tobacco: Never Used  . Alcohol Use: Yes     Comment: 3-4 per week    OB History    No data available     Review of Systems  All other systems reviewed and are negative.     Allergies  Sulfamethoxazole  Home Medications   Prior to Admission medications   Medication Sig Start Date End Date Taking? Authorizing Provider  amLODipine (NORVASC) 10 MG tablet Take 10 mg by mouth daily.  06/16/15  Yes Historical Provider, MD  feeding supplement, ENSURE ENLIVE, (ENSURE ENLIVE) LIQD Take 237 mLs by mouth 2 (two) times daily between meals. Patient taking differently: Take 237 mLs by mouth daily.  08/16/15  Yes Florencia Reasons, MD  levothyroxine (SYNTHROID, LEVOTHROID) 25 MCG tablet Take 25 mcg by mouth daily before breakfast.   Yes Historical Provider, MD  lisinopril (PRINIVIL,ZESTRIL) 5 MG tablet Take 5 mg by mouth daily.   Yes Historical Provider, MD  Multiple Vitamins-Minerals (MULTIVITAMIN GUMMIES ADULT) CHEW Chew 1 tablet by mouth daily.   Yes Historical Provider, MD  polyethylene glycol powder (GLYCOLAX/MIRALAX) powder Take 17 g by mouth daily as needed for mild constipation.    Yes Historical Provider, MD  HYDROcodone-acetaminophen (NORCO/VICODIN) 5-325 MG tablet Take 2 tablets by mouth every 4 (four) hours as  needed. 09/06/15   Noemi Chapel, MD  sodium chloride 1 g tablet Take 1 tablet (1 g total) by mouth 2 (two) times daily with a meal. Patient not taking: Reported on 09/05/2015 08/16/15   Florencia Reasons, MD   BP 132/58 mmHg  Pulse 74  Temp(Src) 97.4 F (36.3 C) (Oral)  Resp 18  Ht 5' (1.524 m)  Wt 88 lb (39.917 kg)  BMI 17.19 kg/m2  SpO2 99% Physical Exam  Constitutional: She appears well-developed and well-nourished. No distress.  HENT:  Head:  Normocephalic and atraumatic.  Mouth/Throat: Oropharynx is clear and moist. No oropharyngeal exudate.  Eyes: Conjunctivae and EOM are normal. Pupils are equal, round, and reactive to light. Right eye exhibits no discharge. Left eye exhibits no discharge. No scleral icterus.  Neck: Normal range of motion. Neck supple. No JVD present. No thyromegaly present.  Cardiovascular: Normal rate, regular rhythm, normal heart sounds and intact distal pulses.  Exam reveals no gallop and no friction rub.   No murmur heard. Pulmonary/Chest: Effort normal and breath sounds normal. No respiratory distress. She has no wheezes. She has no rales.  Abdominal: Soft. Bowel sounds are normal. She exhibits distension. She exhibits no mass. There is tenderness.  Moderate distention of the abdomen, tympanitic sounds to percussion, no guarding but has diffuse tenderness. No peritoneal signs  Musculoskeletal: Normal range of motion. She exhibits no edema or tenderness.  Lymphadenopathy:    She has no cervical adenopathy.  Neurological: She is alert. Coordination normal.  Skin: Skin is warm and dry. No rash noted. No erythema.  Psychiatric: She has a normal mood and affect. Her behavior is normal.  Nursing note and vitals reviewed.   ED Course  Procedures (including critical care time) Labs Review Labs Reviewed  LIPASE, BLOOD - Abnormal; Notable for the following:    Lipase 62 (*)    All other components within normal limits  COMPREHENSIVE METABOLIC PANEL - Abnormal; Notable for the following:    Glucose, Bld 121 (*)    AST 71 (*)    ALT 57 (*)    Alkaline Phosphatase 191 (*)    Total Bilirubin 2.8 (*)    All other components within normal limits  CBC  URINALYSIS, ROUTINE W REFLEX MICROSCOPIC (NOT AT [facility])    Imaging Review Ct Abdomen Pelvis W Contrast  09/05/2015  CLINICAL DATA:  Acute onset of generalized abdominal pain, status post internal/external biliary drainage catheter placement. Initial encounter.  EXAM: CT ABDOMEN AND PELVIS WITH CONTRAST TECHNIQUE: Multidetector CT imaging of the abdomen and pelvis was performed using the standard protocol following bolus administration of intravenous contrast. CONTRAST:  96mL OMNIPAQUE IOHEXOL 300 MG/ML  SOLN COMPARISON:  CT of the abdomen and pelvis performed 08/09/2015 FINDINGS: The visualized lung bases are clear. Scattered pneumobilia is slightly more prominent than on the prior study, reflecting the common bile duct stent. The external biliary drainage catheter is noted in expected position, adjacent to the upper aspect of the common bile duct stent. Vague soft tissue edema is suggested about the pancreatic head and second segment of the duodenum. This is relatively stable in appearance and likely reflects the recent procedure. There is no definite evidence for pancreatitis at this time. The liver and spleen are otherwise unremarkable. The adrenal glands are within normal limits. The kidneys are unremarkable in appearance. There is no evidence of hydronephrosis. No renal or ureteral stones are seen. No perinephric stranding is appreciated. The small bowel is unremarkable in appearance. The stomach is within  normal limits. No acute vascular abnormalities are seen. The appendix is not definitely seen; there is no evidence of appendicitis. Minimal diverticulosis is noted at the cecum, without evidence of diverticulitis. The sigmoid colon is mildly redundant. The bladder is mildly distended and grossly unremarkable. The uterus is grossly unremarkable in appearance. No suspicious adnexal masses are seen. No inguinal lymphadenopathy is seen. No acute osseous abnormalities are identified. There is mild grade 1 retrolisthesis of L1 on L2 and of L2 on L3, and mild grade 1 anterolisthesis of L5 on S1. Underlying mild facet disease is noted. IMPRESSION: 1. No acute abnormality seen to explain the patient's symptoms. 2. Scattered pneumobilia is slightly more prominent, status post  placement of a common bile duct stent. External biliary drainage catheter noted in expected position, adjacent to the upper aspect of the common bile duct stent. 3. Vague soft tissue edema about the pancreatic head and second segment of duodenum is relatively stable from the prior CT, and likely reflects the recent procedure. 4. Minimal diverticulosis at the cecum, without evidence of diverticulitis. 5. Mild degenerative change along the lumbar spine. Electronically Signed   By: Garald Balding M.D.   On: 09/05/2015 23:59   Ir Biliary Stent(s) Existing Access Inc Dilation Cath Exchange  09/05/2015  INDICATION: 82 year old female who initially presented with painless jaundice. Initial ERCP was unsuccessful due to the occlusion of the common bile duct. Patient therefore underwent percutaneous transhepatic cholangiogram with 4 septa biopsy and placement of an internal/ external biliary drainage catheter. The initial biopsy specimens were negative for malignancy. The patient then returned Interventional Radiology for repeat brush biopsy. This biopsy yielded atypical cells but was again, nondiagnostic for malignancy. Patient presents today for repeat cholangiogram, repeat biopsy and placement of an internal and potentially removable covered metal stent. EXAM: IR BILIARY STENT EXISTING ACCESS INC DILATION, CATH EXCHANGE; IR ENDOLUMINAL BIOPSY OF BILIARY TREE COMPARISON:  Prior cholangiogram images 08/20/2015 and 08/09/2016 MEDICATIONS: 3.375 g Zosyn; The antibiotic was administered within an appropriate time frame prior to the initiation of the procedure. ANESTHESIA/SEDATION: Fentanyl 150 mcg IV; Versed 3 mg IV Moderate Sedation Time:  65 minutes The patient's level of consciousness and vital signs were continuously monitored by radiology nursing throughout the procedure under my supervision. FLUOROSCOPY TIME:  Fluoroscopy Time: 14 minutes 6 seconds (172 mGy). COMPLICATIONS: None immediate. TECHNIQUE: Informed written  consent was obtained from the patient after a thorough discussion of the procedural risks, benefits and alternatives. All questions were addressed. Maximal Sterile Barrier Technique was utilized including caps, mask, sterile gowns, sterile gloves, sterile drape, hand hygiene and skin antiseptic. A timeout was performed prior to the initiation of the procedure. An initial fluoroscopic image confirmed expected internal/external placement of the existing 10 [place] biliary drainage catheter. The catheter was cut and removed over a 0.035 inch wire. Local anesthesia was attained by infiltration with 1% lidocaine. A 7 French cook flexor sheath was advanced over the wire and positioned in the common hepatic duct. A cholangiogram was performed. There is a high grade stenosis of the proximal and mid common bile duct measuring approximately 2.2 cm in length. The 0.035 wire was exchanged via a 5 [place] catheter for a 0.018 inch wire. Multiple (3) brush biopsies were then obtained coaxially through the 7 French sheath. Biopsy specimens were focused on the high-grade stenosis. Next, the [facility] flexible out ligature forceps were advanced coaxially through the 7 French sheath and several biopsy specimens were obtained. These were then sent to pathology for frozen  section analysis. The initial pathologic impression was scant atypical common bile duct glandular tissue which was considered insufficient for immediate diagnosis. Therefore, an additional 6 forceps a biopsy specimens were obtained from the proximal, mid and distal aspect of the high-grade stenosis. The 0.018 wire was then exchanged for a 0.035 Amplatz wire. The 7 French sheath was exchanged for a 9 [place] sheath which was advanced through the stenosis and into the duodenum. A 10 mm x 6 cm wall flex stent was then advanced and positioned across the stenosis. The stent was unsheathed and deployed. Following deployment, a cholangiogram was performed through the sheath  demonstrating rapid passage of contrast material through the stent and into the duodenum. There is no residual biliary ductal dilatation. The sheath was removed and a Cook 10.2 [place] multipurpose drainage catheter was advanced over the wire and formed at the intrahepatic confluence superior to the stent. The tube was capped and secured to the skin with 0 Prolene suture. Overall, the patient tolerated the procedure well. FINDINGS: Cholangiogram demonstrates an approximately 2.2 cm high-grade and slightly irregular stenosis of the proximal and mid common bile duct. Image guided brush and forceps biopsy of the abnormal stricture. Placement of a potentially retrievable covered metal wall flex stent across the stenosis. IMPRESSION: 1. Persistent slightly irregular 2.2 cm high-grade stenosis of the proximal and mid common bile duct. 2. Multiple brush and forceps biopsies performed with imaging guidance. 3. Placement of a 10 mm x 6 cm potentially retrievable covered metal wallflex stent. 4. Placement of an external biliary drainage catheter at the hepatic confluence proximal to the stent. This tube was left capped. PLAN: 1. One week capped trial to insure adequate clinical functioning of the stent. Patient will return Interventional Radiology next week on Thursday January 25th for contrast injection and likely drain removal. Signed, Criselda Peaches, MD [facility] Radiology Electronically Signed   By: Jacqulynn Cadet M.D.   On: 09/05/2015 16:16   Ir Endoluminal Bx Of Biliary Tree  09/05/2015  INDICATION: 82 year old female who initially presented with painless jaundice. Initial ERCP was unsuccessful due to the occlusion of the common bile duct. Patient therefore underwent percutaneous transhepatic cholangiogram with 4 septa biopsy and placement of an internal/ external biliary drainage catheter. The initial biopsy specimens were negative for malignancy. The patient  then returned Interventional Radiology for repeat brush biopsy. This biopsy yielded atypical cells but was again, nondiagnostic for malignancy. Patient presents today for repeat cholangiogram, repeat biopsy and placement of an internal and potentially removable covered metal stent. EXAM: IR BILIARY STENT EXISTING ACCESS INC DILATION, CATH EXCHANGE; IR ENDOLUMINAL BIOPSY OF BILIARY TREE COMPARISON:  Prior cholangiogram images 08/20/2015 and 08/09/2016 MEDICATIONS: 3.375 g Zosyn; The antibiotic was administered within an appropriate time frame prior to the initiation of the procedure. ANESTHESIA/SEDATION: Fentanyl 150 mcg IV; Versed 3 mg IV Moderate Sedation Time:  65 minutes The patient's level of consciousness and vital signs were continuously monitored by radiology nursing throughout the procedure under my supervision. FLUOROSCOPY TIME:  Fluoroscopy Time: 14 minutes 6 seconds (172 mGy). COMPLICATIONS: None immediate. TECHNIQUE: Informed written consent was obtained from the patient after a thorough discussion of the procedural risks, benefits and alternatives. All questions were addressed. Maximal Sterile Barrier Technique was utilized including caps, mask, sterile gowns, sterile gloves, sterile drape, hand hygiene and skin antiseptic. A timeout was performed prior to the initiation of the procedure. An initial fluoroscopic image confirmed expected internal/external placement of the existing 10 [place]  biliary drainage catheter. The catheter was cut and removed over a 0.035 inch wire. Local anesthesia was attained by infiltration with 1% lidocaine. A 7 French cook flexor sheath was advanced over the wire and positioned in the common hepatic duct. A cholangiogram was performed. There is a high grade stenosis of the proximal and mid common bile duct measuring approximately 2.2 cm in length. The 0.035 wire was exchanged via a 5 [place] catheter for a 0.018 inch wire. Multiple (3) brush biopsies were then obtained  coaxially through the 7 French sheath. Biopsy specimens were focused on the high-grade stenosis. Next, the [facility] flexible out ligature forceps were advanced coaxially through the 7 French sheath and several biopsy specimens were obtained. These were then sent to pathology for frozen section analysis. The initial pathologic impression was scant atypical common bile duct glandular tissue which was considered insufficient for immediate diagnosis. Therefore, an additional 6 forceps a biopsy specimens were obtained from the proximal, mid and distal aspect of the high-grade stenosis. The 0.018 wire was then exchanged for a 0.035 Amplatz wire. The 7 French sheath was exchanged for a 9 [place] sheath which was advanced through the stenosis and into the duodenum. A 10 mm x 6 cm wall flex stent was then advanced and positioned across the stenosis. The stent was unsheathed and deployed. Following deployment, a cholangiogram was performed through the sheath demonstrating rapid passage of contrast material through the stent and into the duodenum. There is no residual biliary ductal dilatation. The sheath was removed and a Cook 10.2 [place] multipurpose drainage catheter was advanced over the wire and formed at the intrahepatic confluence superior to the stent. The tube was capped and secured to the skin with 0 Prolene suture. Overall, the patient tolerated the procedure well. FINDINGS: Cholangiogram demonstrates an approximately 2.2 cm high-grade and slightly irregular stenosis of the proximal and mid common bile duct. Image guided brush and forceps biopsy of the abnormal stricture. Placement of a potentially retrievable covered metal wall flex stent across the stenosis. IMPRESSION: 1. Persistent slightly irregular 2.2 cm high-grade stenosis of the proximal and mid common bile duct. 2. Multiple brush and forceps biopsies performed with imaging guidance. 3. Placement of a 10 mm x 6 cm potentially retrievable covered metal wallflex  stent. 4. Placement of an external biliary drainage catheter at the hepatic confluence proximal to the stent. This tube was left capped. PLAN: 1. One week capped trial to insure adequate clinical functioning of the stent. Patient will return Interventional Radiology next week on Thursday January 25th for contrast injection and likely drain removal. Signed, Criselda Peaches, MD [facility] Radiology Electronically Signed   By: Jacqulynn Cadet M.D.   On: 09/05/2015 16:16   I have personally reviewed and evaluated these images and lab results as part of my medical decision-making.   MDM   Final diagnoses:  Abdominal pain, unspecified abdominal location    The patient has increased abdominal pain and distention, this is concerning for either intra-abdominal injury or bowel obstruction, possibly a biliary leak, will obtain CT scan, labs are very unremarkable compared to prior. Discussed with radiologist, Dr. Radene Knee who is in agreement with CT scan as the best imaging option at this point  CT without actue fidnings - tubes and stents in right place - no bowel injury or SBO - pt informed and well appearing. VS normal  In addition to written d/c instructions, the pt was given verbal d/c instructions including the indications  for return and expressed understanding to the instructions.    Noemi Chapel, MD 09/06/15 [phone]

## 2015-09-05 NOTE — Progress Notes (Signed)
Clarified w Dr Laurence Ferrari, post care for biliary drain

## 2015-09-05 NOTE — Sedation Documentation (Signed)
re medicated moaning in pain

## 2015-09-05 NOTE — ED Notes (Signed)
Pt states she had a stent placed in her bile duct today and was not given any pain meds for f/u. States that she is now having pain in her abdomen. Drains remain in place from December. Alert and oriented.

## 2015-09-05 NOTE — Progress Notes (Signed)
Patient ID: Summer Jones, female   DOB: May 17, 1930, 82 y.o.   MRN: [id number]    Referring Physician(s): Jacobs,D  Chief Complaint: Obstructive jaundice/common bile duct stenosis   Subjective: Patient is familiar to IR service from prior placement of an internal/external biliary drain and forceps biopsy on 12/22 secondary to common bile duct obstruction. Pathology from the forceps biopsy revealed no evidence of malignancy. Patient was recently admitted to the hospital on 12/27 secondary to dehydration and hyponatremia. Her biliary drain was capped on 12/28. She was discharged home on 12/30. She has done well since discharge with no fevers, significant abdominal or back pain, nausea/ vomiting or abnormal bleeding. She presented again to the IR department on 08/20/15 for common bile duct brush biopsy and biliary drain catheter exchange. Pathology from the brush biopsy revealed atypical cells not diagnostic for malignancy. She presents again today for follow-up cholangiogram with repeat common bile duct biopsy and possible biliary stent placement. She is currently  asymptomatic other than some intermittent epigastric discomfort.   Allergies: Sulfamethoxazole  Medications: Prior to Admission medications   Medication Sig Start Date End Date Taking? Authorizing Provider  amLODipine (NORVASC) 10 MG tablet Take 10 mg by mouth daily.  06/16/15  Yes Historical Provider, MD  feeding supplement, ENSURE ENLIVE, (ENSURE ENLIVE) LIQD Take 237 mLs by mouth 2 (two) times daily between meals. 08/16/15  Yes Florencia Reasons, MD  levothyroxine (SYNTHROID, LEVOTHROID) 25 MCG tablet Take 25 mcg by mouth daily before breakfast.   Yes Historical Provider, MD  lisinopril (PRINIVIL,ZESTRIL) 5 MG tablet Take 5 mg by mouth daily.   Yes Historical Provider, MD  Multiple Vitamins-Minerals (MULTIVITAMIN GUMMIES ADULT) CHEW Chew 1 tablet by mouth daily.   Yes Historical Provider, MD  hydroxypropyl methylcellulose / hypromellose  (ISOPTO TEARS / GONIOVISC) 2.5 % ophthalmic solution Place 2 drops into both eyes 3 (three) times daily as needed for dry eyes.    Historical Provider, MD  polyethylene glycol powder (GLYCOLAX/MIRALAX) powder Take 17 g by mouth daily.    Historical Provider, MD  sodium chloride (OCEAN) 0.65 % SOLN nasal spray Place 1 spray into both nostrils as needed for congestion.    Historical Provider, MD  sodium chloride 1 g tablet Take 1 tablet (1 g total) by mouth 2 (two) times daily with a meal. 08/16/15   Florencia Reasons, MD     Vital Signs: BP 124/67 mmHg  Pulse 68  Temp(Src) 97.4 F (36.3 C) (Oral)  Resp 16  SpO2 100%  Physical Exam patient awake, alert. Chest clear to auscultation bilaterally. Heart with regular rate and rhythm. Abdomen soft, positive bowel sounds, mild epigastric tenderness to palpation. Biliary drain capped, insertion site okay, no evidence of leaking. Extremities with no edema.  Imaging: No results found.  Labs:  CBC:  Recent Labs  08/15/15 0454 08/16/15 0433 08/20/15 1030 09/05/15 1000  WBC 7.1 7.1 6.7 5.4  HGB 9.2* 8.9* 10.4* 12.4  HCT 27.4* 26.7* 32.0* 38.7  PLT 320 312 548* 278    COAGS:  Recent Labs  08/06/15 1429 08/20/15 1030  INR 0.95 0.97  APTT 27  --     BMP:  Recent Labs  08/15/15 0500 08/15/15 1816 08/16/15 0433 08/20/15 1030  NA 133* 134* 131* 135  K 5.1 4.3 4.5 4.1  CL 102 100* 101 98*  CO2 23 23 22 27   GLUCOSE 98 116* 100* 112*  BUN 15 14 10 10   CALCIUM 8.6* 8.1* 8.4* 9.6  CREATININE 0.67 0.56 0.52 0.65  GFRNONAA >60 >60 >60 >60  GFRAA >60 >60 >60 >60    LIVER FUNCTION TESTS:  Recent Labs  08/13/15 0802 08/15/15 0454 08/16/15 0433 08/20/15 1030  BILITOT 10.9* 7.5* 5.6* 5.0*  AST 58* 62* 59* 51*  ALT 123* 97* 92* 82*  ALKPHOS 513* 427* 393* 320*  PROT 7.3 6.1* 5.5* 7.2  ALBUMIN 4.1 2.9* 2.7* 3.6    Assessment and Plan: Pt with hx of internal/external biliary drain and forceps biopsy on 12/22 secondary to common  bile duct obstruction. Pathology from the forceps biopsy revealed no evidence of malignancy. Patient was recently admitted to the hospital on 12/27 secondary to dehydration and hyponatremia. Her biliary drain was capped on 12/28. She was discharged home on 12/30. She has done well since discharge with no fevers, significant abdominal or back pain, nausea/ vomiting or abnormal bleeding. She presented again to the IR department on 08/20/15 for common bile duct brush biopsy and biliary drain catheter exchange. Pathology from the brush biopsy revealed atypical cells not diagnostic for malignancy. She presents again today for follow-up cholangiogram with repeat common bile duct biopsy and possible biliary stent placement. She is currently  asymptomatic other than some intermittent epigastric discomfort. Details/risks of procedures, including not limited to, internal bleeding, infection, inability to remove drain catheter or place stent, discussed with patient and family with their understanding and consent.     Electronically Signed: D. Rowe Robert 09/05/2015, 10:33 AM   I spent a total of 15 minutes at the the patient's bedside AND on the patient's hospital floor or unit, greater than 50% of which was counseling/coordinating care for cholangiogram with possible common bile duct biopsy and biliary stent placement

## 2015-09-05 NOTE — ED Notes (Signed)
Had biliary duct biopsies and internal/external biliary drain done today.  Went home.  Now complaining of abd pain.  Radiology is leaving drain leg bags with the ED secretary if the patient needs one.

## 2015-09-05 NOTE — Discharge Instructions (Signed)
Moderate Conscious Sedation, Adult, Care After Refer to this sheet in the next few weeks. These instructions provide you with information on caring for yourself after your procedure. Your health care provider may also give you more specific instructions. Your treatment has been planned according to current medical practices, but problems sometimes occur. Call your health care provider if you have any problems or questions after your procedure. WHAT TO EXPECT AFTER THE PROCEDURE  After your procedure:  You may feel sleepy, clumsy, and have poor balance for several hours.  Vomiting may occur if you eat too soon after the procedure. HOME CARE INSTRUCTIONS  Do not participate in any activities where you could become injured for at least 24 hours. Do not:  Drive.  Swim.  Ride a bicycle.  Operate heavy machinery.  Cook.  Use power tools.  Climb ladders.  Work from a high place.  Do not make important decisions or sign legal documents until you are improved.  If you vomit, drink water, juice, or soup when you can drink without vomiting. Make sure you have little or no nausea before eating solid foods.  Only take over-the-counter or prescription medicines for pain, discomfort, or fever as directed by your health care provider.  Make sure you and your family fully understand everything about the medicines given to you, including what side effects may occur.  You should not drink alcohol, take sleeping pills, or take medicines that cause drowsiness for at least 24 hours.  If you smoke, do not smoke without supervision.  If you are feeling better, you may resume normal activities 24 hours after you were sedated.  Keep all appointments with your health care provider. SEEK MEDICAL CARE IF:  Your skin is pale or bluish in color.  You continue to feel nauseous or vomit.  Your pain is getting worse and is not helped by medicine.  You have bleeding or swelling.  You are still  sleepy or feeling clumsy after 24 hours. SEEK IMMEDIATE MEDICAL CARE IF:  You develop a rash.  You have difficulty breathing.  You develop any type of allergic problem.  You have a fever.     This information is not intended to replace advice given to you by your health care provider. Make sure you discuss any questions you have with your health care provider.   Document Released: 05/24/2013 Document Revised: 08/24/2014 Document Reviewed: 05/24/2013 Elsevier Interactive Patient Education 2016 [employer].                 No new orders for Catheter care. Continue as prior.              Call Radiology at [phone] , or if after hours and no answer,              Call Dr Eugenia Pancoast on call service at [phone]

## 2015-09-06 LAB — URINALYSIS, ROUTINE W REFLEX MICROSCOPIC
Bilirubin Urine: NEGATIVE
GLUCOSE, UA: NEGATIVE mg/dL
Hgb urine dipstick: NEGATIVE
Ketones, ur: 40 mg/dL — AB
LEUKOCYTES UA: NEGATIVE
Nitrite: NEGATIVE
PROTEIN: NEGATIVE mg/dL
Specific Gravity, Urine: 1.031 — ABNORMAL HIGH (ref 1.005–1.030)
pH: 6.5 (ref 5.0–8.0)

## 2015-09-06 MED ORDER — HYDROCODONE-ACETAMINOPHEN 5-325 MG PO TABS: 2.0000 | ORAL_TABLET | ORAL | Status: DC | PRN

## 2015-09-06 NOTE — Discharge Instructions (Signed)

## 2015-09-12 ENCOUNTER — Ambulatory Visit (HOSPITAL_COMMUNITY)
Admission: RE | Admit: 2015-09-12 | Discharge: 2015-09-12 | Disposition: A | Discharge status: Home / Self Care | Payer: Medicare Other | Source: Ambulatory Visit | Attending: Interventional Radiology | Admitting: Interventional Radiology

## 2015-09-12 DIAGNOSIS — Z4682 Encounter for fitting and adjustment of non-vascular catheter: Secondary | ICD-10-CM | POA: Insufficient documentation

## 2015-09-12 DIAGNOSIS — R17 Unspecified jaundice: Secondary | ICD-10-CM | POA: Insufficient documentation

## 2015-09-12 DIAGNOSIS — K831 Obstruction of bile duct: Secondary | ICD-10-CM | POA: Insufficient documentation

## 2015-09-12 MED ORDER — IOHEXOL 300 MG/ML  SOLN
8.0000 mL | Freq: Once | INTRAMUSCULAR | Status: AC | PRN
  Administered 2015-09-12: 8 mL

## 2015-09-13 ENCOUNTER — Other Ambulatory Visit (HOSPITAL_COMMUNITY): Payer: Self-pay | Admitting: Interventional Radiology

## 2015-09-13 DIAGNOSIS — K831 Obstruction of bile duct: Secondary | ICD-10-CM

## 2015-10-04 ENCOUNTER — Ambulatory Visit (INDEPENDENT_AMBULATORY_CARE_PROVIDER_SITE_OTHER): Payer: Medicare Other | Admitting: Gastroenterology

## 2015-10-04 ENCOUNTER — Other Ambulatory Visit (INDEPENDENT_AMBULATORY_CARE_PROVIDER_SITE_OTHER): Payer: Medicare Other

## 2015-10-04 ENCOUNTER — Encounter: Payer: Self-pay | Admitting: Gastroenterology

## 2015-10-04 VITALS — BP 112/60 | HR 68 | Ht 59.0 in | Wt 89.5 lb

## 2015-10-04 DIAGNOSIS — K838 Other specified diseases of biliary tract: Secondary | ICD-10-CM | POA: Diagnosis not present

## 2015-10-04 DIAGNOSIS — K831 Obstruction of bile duct: Secondary | ICD-10-CM

## 2015-10-04 LAB — COMPREHENSIVE METABOLIC PANEL
ALBUMIN: 4.4 g/dL (ref 3.5–5.2)
ALT: 36 U/L — ABNORMAL HIGH (ref 0–35)
AST: 35 U/L (ref 0–37)
Alkaline Phosphatase: 195 U/L — ABNORMAL HIGH (ref 39–117)
BUN: 12 mg/dL (ref 6–23)
CALCIUM: 9.7 mg/dL (ref 8.4–10.5)
CHLORIDE: 102 meq/L (ref 96–112)
CO2: 27 meq/L (ref 19–32)
Creatinine, Ser: 0.6 mg/dL (ref 0.40–1.20)
GFR: 101.36 mL/min (ref >60.00)
Glucose, Bld: 110 mg/dL — ABNORMAL HIGH (ref 70–99)
POTASSIUM: 4.5 meq/L (ref 3.5–5.1)
Sodium: 137 mEq/L (ref 135–145)
Total Bilirubin: 1.1 mg/dL (ref 0.2–1.2)
Total Protein: 7.6 g/dL (ref 6.0–8.3)

## 2015-10-04 LAB — CBC WITH DIFFERENTIAL/PLATELET
BASOS ABS: 0.1 10*3/uL (ref 0.0–0.1)
Basophils Relative: 1.7 % (ref 0.0–3.0)
EOS PCT: 3.3 % (ref 0.0–5.0)
Eosinophils Absolute: 0.2 10*3/uL (ref 0.0–0.7)
HCT: 35.5 % — ABNORMAL LOW (ref 36.0–46.0)
HEMOGLOBIN: 12 g/dL (ref 12.0–15.0)
LYMPHS PCT: 20.2 % (ref 12.0–46.0)
Lymphs Abs: 1.3 10*3/uL (ref 0.7–4.0)
MCHC: 33.7 g/dL (ref 30.0–36.0)
MCV: 92 fl (ref 78.0–100.0)
MONOS PCT: 11.1 % (ref 3.0–12.0)
Monocytes Absolute: 0.7 10*3/uL (ref 0.1–1.0)
Neutro Abs: 4.1 10*3/uL (ref 1.4–7.7)
Neutrophils Relative %: 63.7 % (ref 43.0–77.0)
Platelets: 297 10*3/uL (ref 150.0–400.0)
RBC: 3.86 Mil/uL — AB (ref 3.87–5.11)
RDW: 14.1 % (ref 11.5–15.5)
WBC: 6.4 10*3/uL (ref 4.0–10.5)

## 2015-10-04 LAB — PROTIME-INR
INR: 1 ratio (ref 0.8–1.0)
PROTHROMBIN TIME: 10.6 s (ref 9.6–13.1)

## 2015-10-04 NOTE — Patient Instructions (Addendum)
You will have labs checked today in the basement lab.  Please head down after you check out with the front desk  (cmet, inr, cbc) You will be set up for a CT scan of abdomen and pelvis with IV and oral contrast (in 2 months). Will plan for ERCP following that CT scan depending on the results.  We will call you to set this appointment up closer to the time the test is needed.   A copy of this information will be made available to Dr. Jacqulynn Cadet.

## 2015-10-04 NOTE — Progress Notes (Signed)
Review of pertinent gastrointestinal problems: 1. Common bile duct stricture presented with painless jaundice, weight loss; December 2016, total bilirubin max 16.6.  Initial CAT scan showed no clear pancreatic mass. She underwent endoscopic ultrasound and ERCP, Dr. [name]. This was complicated by a very small mouth inability to pass the linear echoendoscope. Vague, masslike area in the head of the pancreas was noted. ERCP was unsuccessful as well, inability to pass a very tight distal bile duct stricture with the wire. Eventual percutaneous internal/external drain placed by interventional radiology. Cytology, pathology with forceps biopsies through interventional radiology on 3 separate occasions have never shown clear malignancy but a few 'atypical cells' were noted.  Most recent sample of the CBD stricture was 09/05/2015. At that time fully removable, covered biliary stent was placed by interventional radiology. The int/ext drain was removed about a week later. Most recent TBili was 09/05/2015, was 2.0.  CA 19-9, about 100.     HPI: This is a very pleasant 82 year old woman whom I last saw about 2 months ago but I have been following along through Epic over the last 2 months. I talked with her on the phone at least twice as well.  Chief complaint is bile duct stricture  She feels well.  She is not eating very well, poor appetite.  But not losing weight.  She has very intermittent brief abd pains, no signficant abd pains.  No overt jaundice, no fevers or chills.  Urine looks normal.     Past Medical History  Diagnosis Date  . Diverticular disease   . Arthritis   . Diverticulosis   . Hypertension   . Thyroid disease   . Mitral valve prolapse   . Difficult intubation Anterior glottis, short TMD, limited mouth opening. Required glidescope and bougie stylet with multiple attempts.  . Common bile duct (CBD) obstruction   . Jaundice   . Hypothyroidism     Past Surgical History  Procedure  Laterality Date  . Inguinal hernia repair      right  . Breast biopsy    . Tonsillectomy    . Eus N/A 08/08/2015    Procedure: UPPER ENDOSCOPIC ULTRASOUND (EUS) RADIAL;  Surgeon: [name], MD;  Location: [facility];  Service: Endoscopy;  Laterality: N/A;  . Endoscopic retrograde cholangiopancreatography (ercp) with propofol N/A 08/08/2015    Procedure: ENDOSCOPIC RETROGRADE CHOLANGIOPANCREATOGRAPHY (ERCP) WITH PROPOFOL;  Surgeon: [name], MD;  Location: [facility];  Service: Endoscopy;  Laterality: N/A;    Current Outpatient Prescriptions  Medication Sig Dispense Refill  . amLODipine (NORVASC) 10 MG tablet Take 10 mg by mouth daily.     Marland Kitchen levothyroxine (SYNTHROID, LEVOTHROID) 25 MCG tablet Take 25 mcg by mouth daily before breakfast.    . lisinopril (PRINIVIL,ZESTRIL) 5 MG tablet Take 5 mg by mouth daily.    . Multiple Vitamins-Minerals (MULTIVITAMIN GUMMIES ADULT) CHEW Chew 1 tablet by mouth daily.    . Nutritional Supplements (ENSURE ENLIVE PO) Take 237 mLs by mouth as needed.    . polyethylene glycol powder (GLYCOLAX/MIRALAX) powder Take 17 g by mouth as needed for mild constipation.     Marland Kitchen HYDROcodone-acetaminophen (NORCO/VICODIN) 5-325 MG tablet Take 2 tablets by mouth every 4 (four) hours as needed. (Patient not taking: Reported on 10/04/2015) 10 tablet 0   No current facility-administered medications for this visit.    Allergies as of 10/04/2015 - Review Complete 10/04/2015  Allergen Reaction Noted  . Sulfamethoxazole Nausea Only 08/13/2015    Family History  Problem  Relation Age of Onset  . Breast cancer Sister   . Heart disease Father     Social History   Social History  . Marital Status: Married    Spouse Name: N/A  . Number of Children: 2  . Years of Education: N/A   Occupational History  . Retired    Social History Main Topics  . Smoking status: Never Smoker   . Smokeless tobacco: Never Used  . Alcohol Use: Yes     Comment: 3-4 per week    . Drug Use: No  . Sexual Activity: Not on file   Other Topics Concern  . Not on file   Social History Narrative     Physical Exam: BP 112/60 mmHg  Pulse 68  Ht 4\' 11"  (1.499 m)  Wt 89 lb 8 oz (40.597 kg)  BMI 18.07 kg/m2 Constitutional: generally well-appearing Psychiatric: alert and oriented x3 Abdomen: soft, nontender, nondistended, no obvious ascites, no peritoneal signs, normal bowel sounds   Assessment and plan: 82 y.o. female with distal CBD stricture of unclear etiology  She currently has a removable, covered biliary stent in place which was placed by interventional radiology about one month ago. Her last cross-sectional imaging was at the same time. We have never seen clear tumor or mass and repeated biopsies, 3 times, have shown no clear malignancy in the bile duct stricture. She does understand that the suspicion is still high but she does have underlying malignancy but in truth this might be benign as well. She is not losing weight anymore. She is getting along just fine. She seems to understand the diagnostic dilemma we are in. She has been offered referral to pancreatic surgeon but she is not at all interested in any type of major operation which this would require. I plan is for repeat cross-sectional imaging in about 2 months from now which will be a 3 month interval from her last one. Based on that I will likely repeat ERCP, removed the previously placed stent, perhaps replace it if needed, recent able to biliary stricture. She knows to call sooner than 2 months she has any significant problems, specifically mention jaundice, fevers of unknown etiology, significant abdominal pains.  She is also going to get a basic set of labs including CBC complete medical profile coags.   Owens Loffler, MD [facility] 10/04/2015, 3:13 PM

## 2015-10-18 ENCOUNTER — Telehealth: Payer: Self-pay | Admitting: Gastroenterology

## 2015-10-18 DIAGNOSIS — R109 Unspecified abdominal pain: Secondary | ICD-10-CM

## 2015-10-18 NOTE — Telephone Encounter (Signed)
Patient reports that she is having intermittent cramping pain all day yesterday.  The pain is below her umbilicus on her left side.  Pain is not severe lasts for a few seconds at a time and happens about every 45 minutes.  Denies any other symptoms at this time.  Has not happened  Now is several hours.  She wanted to know if she needs anything done prior to the weekend.

## 2015-10-21 ENCOUNTER — Other Ambulatory Visit (INDEPENDENT_AMBULATORY_CARE_PROVIDER_SITE_OTHER): Payer: Medicare Other

## 2015-10-21 DIAGNOSIS — R109 Unspecified abdominal pain: Secondary | ICD-10-CM | POA: Diagnosis not present

## 2015-10-21 LAB — COMPREHENSIVE METABOLIC PANEL
ALT: 43 U/L — AB (ref 0–35)
AST: 42 U/L — ABNORMAL HIGH (ref 0–37)
Albumin: 4.1 g/dL (ref 3.5–5.2)
Alkaline Phosphatase: 240 U/L — ABNORMAL HIGH (ref 39–117)
BUN: 9 mg/dL (ref 6–23)
CALCIUM: 9.5 mg/dL (ref 8.4–10.5)
CHLORIDE: 100 meq/L (ref 96–112)
CO2: 29 meq/L (ref 19–32)
CREATININE: 0.63 mg/dL (ref 0.40–1.20)
GFR: 95.8 mL/min (ref >60.00)
Glucose, Bld: 129 mg/dL — ABNORMAL HIGH (ref 70–99)
POTASSIUM: 4.8 meq/L (ref 3.5–5.1)
Sodium: 136 mEq/L (ref 135–145)
Total Bilirubin: 0.7 mg/dL (ref 0.2–1.2)
Total Protein: 6.6 g/dL (ref 6.0–8.3)

## 2015-10-21 LAB — CBC WITH DIFFERENTIAL/PLATELET
BASOS ABS: 0 10*3/uL (ref 0.0–0.1)
BASOS PCT: 0.9 % (ref 0.0–3.0)
EOS ABS: 0.1 10*3/uL (ref 0.0–0.7)
Eosinophils Relative: 3 % (ref 0.0–5.0)
HEMATOCRIT: 35.6 % — AB (ref 36.0–46.0)
Hemoglobin: 11.9 g/dL — ABNORMAL LOW (ref 12.0–15.0)
LYMPHS ABS: 1.4 10*3/uL (ref 0.7–4.0)
Lymphocytes Relative: 27.6 % (ref 12.0–46.0)
MCHC: 33.4 g/dL (ref 30.0–36.0)
MCV: 91.1 fl (ref 78.0–100.0)
MONOS PCT: 11.7 % (ref 3.0–12.0)
Monocytes Absolute: 0.6 10*3/uL (ref 0.1–1.0)
NEUTROS ABS: 2.8 10*3/uL (ref 1.4–7.7)
NEUTROS PCT: 56.8 % (ref 43.0–77.0)
PLATELETS: 319 10*3/uL (ref 150.0–400.0)
RBC: 3.91 Mil/uL (ref 3.87–5.11)
RDW: 13.9 % (ref 11.5–15.5)
WBC: 4.9 10*3/uL (ref 4.0–10.5)

## 2015-10-21 NOTE — Telephone Encounter (Signed)
Ask how she is feeling today.  She needs cbc, cmet

## 2015-10-21 NOTE — Telephone Encounter (Signed)
Pt feels better, occasional pain on the left side below the umbilicus.  Pt will come in today for labs

## 2015-10-22 ENCOUNTER — Telehealth: Payer: Self-pay | Admitting: Gastroenterology

## 2015-10-22 NOTE — Telephone Encounter (Signed)
Pt has been called and lab results given to her and all questions answered.  She will call with any further concerns

## 2015-10-24 ENCOUNTER — Other Ambulatory Visit: Payer: Self-pay

## 2015-10-24 DIAGNOSIS — K831 Obstruction of bile duct: Secondary | ICD-10-CM

## 2015-10-24 DIAGNOSIS — R109 Unspecified abdominal pain: Secondary | ICD-10-CM

## 2015-10-29 ENCOUNTER — Telehealth: Payer: Self-pay | Admitting: Gastroenterology

## 2015-10-30 NOTE — Telephone Encounter (Signed)
Pt has been advised that she does not need to be fasting for her labs on Monday.

## 2015-11-04 ENCOUNTER — Other Ambulatory Visit (INDEPENDENT_AMBULATORY_CARE_PROVIDER_SITE_OTHER): Payer: Medicare Other

## 2015-11-04 ENCOUNTER — Telehealth: Payer: Self-pay

## 2015-11-04 DIAGNOSIS — K838 Other specified diseases of biliary tract: Secondary | ICD-10-CM

## 2015-11-04 DIAGNOSIS — K831 Obstruction of bile duct: Secondary | ICD-10-CM

## 2015-11-04 DIAGNOSIS — R109 Unspecified abdominal pain: Secondary | ICD-10-CM | POA: Diagnosis not present

## 2015-11-04 LAB — COMPREHENSIVE METABOLIC PANEL
ALT: 55 U/L — ABNORMAL HIGH (ref 0–35)
AST: 55 U/L — AB (ref 0–37)
Albumin: 4.1 g/dL (ref 3.5–5.2)
Alkaline Phosphatase: 279 U/L — ABNORMAL HIGH (ref 39–117)
BUN: 9 mg/dL (ref 6–23)
CALCIUM: 9.5 mg/dL (ref 8.4–10.5)
CHLORIDE: 100 meq/L (ref 96–112)
CO2: 30 meq/L (ref 19–32)
CREATININE: 0.64 mg/dL (ref 0.40–1.20)
GFR: 94.06 mL/min (ref >60.00)
Glucose, Bld: 138 mg/dL — ABNORMAL HIGH (ref 70–99)
Potassium: 4.7 mEq/L (ref 3.5–5.1)
Sodium: 136 mEq/L (ref 135–145)
Total Bilirubin: 0.7 mg/dL (ref 0.2–1.2)
Total Protein: 6.6 g/dL (ref 6.0–8.3)

## 2015-11-04 NOTE — Telephone Encounter (Signed)
-----   Message from Barron Alvine, RN sent at 10/24/2015  1:26 PM EST ----- Pt to get labs see 10/21/15 note

## 2015-11-04 NOTE — Telephone Encounter (Signed)
Letter mailed/My Chart to remind pt to have labs

## 2015-11-06 ENCOUNTER — Other Ambulatory Visit: Payer: Self-pay

## 2015-11-06 DIAGNOSIS — R7989 Other specified abnormal findings of blood chemistry: Secondary | ICD-10-CM

## 2015-11-06 DIAGNOSIS — R945 Abnormal results of liver function studies: Principal | ICD-10-CM

## 2015-12-02 ENCOUNTER — Telehealth: Payer: Self-pay

## 2015-12-02 DIAGNOSIS — K831 Obstruction of bile duct: Secondary | ICD-10-CM

## 2015-12-02 NOTE — Telephone Encounter (Signed)
-----   Message from Barron Alvine, RN sent at 10/04/2015  3:32 PM EST ----- Pt needs CT scan see 10/04/15 note

## 2015-12-03 ENCOUNTER — Other Ambulatory Visit (INDEPENDENT_AMBULATORY_CARE_PROVIDER_SITE_OTHER): Payer: Medicare Other

## 2015-12-03 DIAGNOSIS — R7989 Other specified abnormal findings of blood chemistry: Secondary | ICD-10-CM | POA: Diagnosis not present

## 2015-12-03 DIAGNOSIS — R945 Abnormal results of liver function studies: Principal | ICD-10-CM

## 2015-12-03 LAB — HEPATIC FUNCTION PANEL
ALBUMIN: 4 g/dL (ref 3.5–5.2)
ALK PHOS: 182 U/L — AB (ref 39–117)
ALT: 30 U/L (ref 0–35)
AST: 28 U/L (ref 0–37)
BILIRUBIN TOTAL: 0.5 mg/dL (ref 0.2–1.2)
Bilirubin, Direct: 0.1 mg/dL (ref 0.0–0.3)
Total Protein: 6.9 g/dL (ref 6.0–8.3)

## 2015-12-03 NOTE — Telephone Encounter (Signed)
Pt would like to have the CT scan as late as the day as possible.  She is also waiting on her lab results.  I will call her tomorrow with the results and the CT info.  Order in EPIC Need to call and get scheduled and call the pt with instructions.  See phone note  You have been scheduled for a CT scan of the abdomen and pelvis at [facility] ([id number]---this is in the same building as Press [facility]).   You are scheduled on  at . You should arrive 15 minutes prior to your appointment time for registration. Please follow the written instructions below on the day of your exam:  WARNING: IF YOU ARE ALLERGIC TO IODINE/X-RAY DYE, PLEASE NOTIFY RADIOLOGY IMMEDIATELY AT [phone]! YOU WILL BE GIVEN A 13 HOUR PREMEDICATION PREP.  1) Do not eat or drink anything after  (4 hours prior to your test) 2) You have been given 2 bottles of oral contrast to drink. The solution may taste better if refrigerated, but do NOT add ice or any other liquid to this solution. Shake well before drinking.    Drink 1 bottle of contrast @  (2 hours prior to your exam)  Drink 1 bottle of contrast @  (1 hour prior to your exam)  You may take any medications as prescribed with a small amount of water except for the following: Metformin, Glucophage, Glucovance, Avandamet, Riomet, Fortamet, Actoplus Met, Janumet, Glumetza or Metaglip. The above medications must be held the day of the exam AND 48 hours after the exam.  The purpose of you drinking the oral contrast is to aid in the visualization of your intestinal tract. The contrast solution may cause some diarrhea. Before your exam is started, you will be given a small amount of fluid to drink. Depending on your individual set of symptoms, you may also receive an intravenous injection of x-ray contrast/dye. Plan on being at [facility] for 30 minutes or longer, depending on the type of exam you are having performed.  This test typically takes 30-45  minutes to complete.  If you have any questions regarding your exam or if you need to reschedule, you may call the CT department at [phone] between the hours of 8:00 am and 5:00 pm, Monday-Friday.  ________________________________________________________________________

## 2015-12-03 NOTE — Telephone Encounter (Signed)
Left message on machine to call back  

## 2015-12-06 ENCOUNTER — Ambulatory Visit (INDEPENDENT_AMBULATORY_CARE_PROVIDER_SITE_OTHER)
Admission: RE | Admit: 2015-12-06 | Discharge: 2015-12-06 | Disposition: A | Discharge status: Home / Self Care | Payer: Medicare Other | Source: Ambulatory Visit | Attending: Gastroenterology | Admitting: Gastroenterology

## 2015-12-06 DIAGNOSIS — K831 Obstruction of bile duct: Secondary | ICD-10-CM

## 2015-12-06 MED ORDER — IOPAMIDOL (ISOVUE-300) INJECTION 61%
100.0000 mL | Freq: Once | INTRAVENOUS | Status: AC | PRN
  Administered 2015-12-06: 80 mL via INTRAVENOUS

## 2015-12-10 ENCOUNTER — Telehealth: Payer: Self-pay | Admitting: Gastroenterology

## 2015-12-10 NOTE — Telephone Encounter (Signed)
Dr [name] did you call this pt?

## 2015-12-11 ENCOUNTER — Other Ambulatory Visit: Payer: Self-pay

## 2015-12-11 DIAGNOSIS — K831 Obstruction of bile duct: Secondary | ICD-10-CM

## 2015-12-11 NOTE — Telephone Encounter (Signed)
See result note.  

## 2015-12-11 NOTE — Telephone Encounter (Signed)
i spoke with her yesterday,  Can you review my results note on her abd CT regarding the plan (ERCP with spy glass).

## 2015-12-17 ENCOUNTER — Encounter (HOSPITAL_COMMUNITY): Payer: Self-pay | Admitting: *Deleted

## 2015-12-26 ENCOUNTER — Telehealth: Payer: Self-pay

## 2015-12-26 ENCOUNTER — Encounter (HOSPITAL_COMMUNITY)
Admission: RE | Disposition: A | Discharge status: Home / Self Care | Payer: Self-pay | Source: Ambulatory Visit | Attending: Gastroenterology

## 2015-12-26 ENCOUNTER — Ambulatory Visit (HOSPITAL_COMMUNITY): Payer: Medicare Other | Admitting: Certified Registered Nurse Anesthetist

## 2015-12-26 ENCOUNTER — Ambulatory Visit (HOSPITAL_COMMUNITY)
Admission: RE | Admit: 2015-12-26 | Discharge: 2015-12-26 | Disposition: A | Discharge status: Home / Self Care | Payer: Medicare Other | Source: Ambulatory Visit | Attending: Gastroenterology | Admitting: Gastroenterology

## 2015-12-26 ENCOUNTER — Encounter (HOSPITAL_COMMUNITY): Payer: Self-pay

## 2015-12-26 ENCOUNTER — Ambulatory Visit (HOSPITAL_COMMUNITY): Payer: Medicare Other

## 2015-12-26 DIAGNOSIS — K831 Obstruction of bile duct: Secondary | ICD-10-CM

## 2015-12-26 DIAGNOSIS — I10 Essential (primary) hypertension: Secondary | ICD-10-CM | POA: Insufficient documentation

## 2015-12-26 DIAGNOSIS — Z79899 Other long term (current) drug therapy: Secondary | ICD-10-CM | POA: Diagnosis not present

## 2015-12-26 DIAGNOSIS — E039 Hypothyroidism, unspecified: Secondary | ICD-10-CM | POA: Diagnosis not present

## 2015-12-26 DIAGNOSIS — Z4659 Encounter for fitting and adjustment of other gastrointestinal appliance and device: Secondary | ICD-10-CM

## 2015-12-26 HISTORY — PX: SPYGLASS CHOLANGIOSCOPY: SHX5441

## 2015-12-26 HISTORY — PX: ENDOSCOPIC RETROGRADE CHOLANGIOPANCREATOGRAPHY (ERCP) WITH PROPOFOL: SHX5810

## 2015-12-26 SURGERY — ENDOSCOPIC RETROGRADE CHOLANGIOPANCREATOGRAPHY (ERCP) WITH PROPOFOL
Anesthesia: General

## 2015-12-26 MED ORDER — FENTANYL CITRATE (PF) 100 MCG/2ML IJ SOLN
INTRAMUSCULAR | Status: AC
  Filled 2015-12-26: qty 2

## 2015-12-26 MED ORDER — INDOMETHACIN 50 MG RE SUPP
100.0000 mg | Freq: Once | RECTAL | Status: AC
  Administered 2015-12-26: 100 mg via RECTAL

## 2015-12-26 MED ORDER — SODIUM CHLORIDE 0.9 % IJ SOLN
INTRAMUSCULAR | Status: AC
  Filled 2015-12-26: qty 10

## 2015-12-26 MED ORDER — ONDANSETRON HCL 4 MG/2ML IJ SOLN
INTRAMUSCULAR | Status: AC
  Filled 2015-12-26: qty 2

## 2015-12-26 MED ORDER — LACTATED RINGERS IV SOLN
INTRAVENOUS | Status: DC | PRN
  Administered 2015-12-26 (×2): via INTRAVENOUS

## 2015-12-26 MED ORDER — FENTANYL CITRATE (PF) 100 MCG/2ML IJ SOLN
INTRAMUSCULAR | Status: DC | PRN
  Administered 2015-12-26: 25 ug via INTRAVENOUS

## 2015-12-26 MED ORDER — PROPOFOL 10 MG/ML IV BOLUS
INTRAVENOUS | Status: AC
  Filled 2015-12-26: qty 20

## 2015-12-26 MED ORDER — SUCCINYLCHOLINE CHLORIDE 20 MG/ML IJ SOLN
INTRAMUSCULAR | Status: DC | PRN
  Administered 2015-12-26: 40 mg via INTRAVENOUS
  Administered 2015-12-26: 100 mg via INTRAVENOUS

## 2015-12-26 MED ORDER — LIDOCAINE HCL (CARDIAC) 20 MG/ML IV SOLN
INTRAVENOUS | Status: DC | PRN
  Administered 2015-12-26: 100 mg via INTRAVENOUS

## 2015-12-26 MED ORDER — INDOMETHACIN 50 MG RE SUPP
RECTAL | Status: AC
  Filled 2015-12-26: qty 2

## 2015-12-26 MED ORDER — SODIUM CHLORIDE 0.9 % IV SOLN
INTRAVENOUS | Status: DC | PRN
  Administered 2015-12-26: 70 mL

## 2015-12-26 MED ORDER — EPHEDRINE SULFATE 50 MG/ML IJ SOLN
INTRAMUSCULAR | Status: AC
Start: 2015-12-26 — End: 2015-12-26
  Filled 2015-12-26: qty 1

## 2015-12-26 MED ORDER — PROPOFOL 10 MG/ML IV BOLUS
INTRAVENOUS | Status: DC | PRN
  Administered 2015-12-26: 150 mg via INTRAVENOUS

## 2015-12-26 MED ORDER — EPHEDRINE SULFATE 50 MG/ML IJ SOLN
INTRAMUSCULAR | Status: DC | PRN
  Administered 2015-12-26 (×3): 10 mg via INTRAVENOUS

## 2015-12-26 MED ORDER — LIDOCAINE HCL (CARDIAC) 20 MG/ML IV SOLN
INTRAVENOUS | Status: AC
  Filled 2015-12-26: qty 5

## 2015-12-26 MED ORDER — ONDANSETRON HCL 4 MG/2ML IJ SOLN
INTRAMUSCULAR | Status: DC | PRN
  Administered 2015-12-26: 4 mg via INTRAVENOUS

## 2015-12-26 MED ORDER — SODIUM CHLORIDE 0.9 % IV SOLN
INTRAVENOUS | Status: DC
Start: 2015-12-26 — End: 2015-12-26

## 2015-12-26 MED ORDER — PROPOFOL 10 MG/ML IV BOLUS
INTRAVENOUS | Status: AC
  Filled 2015-12-26: qty 40

## 2015-12-26 NOTE — H&P (Signed)
Review of pertinent gastrointestinal problems: 1. Common bile duct stricture presented with painless jaundice, weight loss; December 2016, total bilirubin max 16.6. Initial CAT scan showed no clear pancreatic mass. She underwent endoscopic ultrasound and ERCP, Dr. [name]. This was complicated by a very small mouth inability to pass the linear echoendoscope. Vague, masslike area in the head of the pancreas was noted. ERCP was unsuccessful as well, inability to pass a very tight distal bile duct stricture with the wire. Eventual percutaneous internal/external drain placed by interventional radiology. Cytology, pathology with forceps biopsies through interventional radiology on 3 separate occasions have never shown clear malignancy but a few 'atypical cells' were noted. Most recent sample of the CBD stricture was 09/05/2015. At that time fully removable, covered biliary stent was placed by interventional radiology. The int/ext drain was removed about a week later. Most recent TBili was 09/05/2015, was 2.0. CA 19-9, about 100.    HPI: This is a very pleasant 82 year old woman whom I last saw about 2 months ago but I have been following along through Epic over the last 2 months. I talked with her on the phone at least twice as well.  Chief complaint is bile duct stricture  She feels well. She is not eating very well, poor appetite. But not losing weight. She has very intermittent brief abd pains, no signficant abd pains. No overt jaundice, no fevers or chills. Urine looks normal.     Past Medical History  Diagnosis Date  . Diverticular disease   . Arthritis   . Diverticulosis   . Hypertension   . Thyroid disease   . Mitral valve prolapse   . Difficult intubation Anterior glottis, short TMD, limited mouth opening. Required glidescope and bougie stylet with multiple attempts.  . Common bile duct (CBD) obstruction   . Jaundice   . Hypothyroidism      Past Surgical History  Procedure Laterality Date  . Inguinal hernia repair      right  . Breast biopsy    . Tonsillectomy    . Eus N/A 08/08/2015    Procedure: UPPER ENDOSCOPIC ULTRASOUND (EUS) RADIAL; Surgeon: [name], MD; Location: [facility]; Service: Endoscopy; Laterality: N/A;  . Endoscopic retrograde cholangiopancreatography (ercp) with propofol N/A 08/08/2015    Procedure: ENDOSCOPIC RETROGRADE CHOLANGIOPANCREATOGRAPHY (ERCP) WITH PROPOFOL; Surgeon: [name], MD; Location: [facility]; Service: Endoscopy; Laterality: N/A;    Current Outpatient Prescriptions  Medication Sig Dispense Refill  . amLODipine (NORVASC) 10 MG tablet Take 10 mg by mouth daily.     Marland Kitchen levothyroxine (SYNTHROID, LEVOTHROID) 25 MCG tablet Take 25 mcg by mouth daily before breakfast.    . lisinopril (PRINIVIL,ZESTRIL) 5 MG tablet Take 5 mg by mouth daily.    . Multiple Vitamins-Minerals (MULTIVITAMIN GUMMIES ADULT) CHEW Chew 1 tablet by mouth daily.    . Nutritional Supplements (ENSURE ENLIVE PO) Take 237 mLs by mouth as needed.    . polyethylene glycol powder (GLYCOLAX/MIRALAX) powder Take 17 g by mouth as needed for mild constipation.     Marland Kitchen HYDROcodone-acetaminophen (NORCO/VICODIN) 5-325 MG tablet Take 2 tablets by mouth every 4 (four) hours as needed. (Patient not taking: Reported on 10/04/2015) 10 tablet 0   No current facility-administered medications for this visit.    Allergies as of 10/04/2015 - Review Complete 10/04/2015  Allergen Reaction Noted  . Sulfamethoxazole Nausea Only 08/13/2015    Family History  Problem Relation Age of Onset  . Breast cancer Sister   . Heart disease Father  Social History   Social History  . Marital Status: Married    Spouse Name: N/A  . Number of Children: 2  . Years of Education: N/A   Occupational  History  . Retired    Social History Main Topics  . Smoking status: Never Smoker   . Smokeless tobacco: Never Used  . Alcohol Use: Yes     Comment: 3-4 per week   . Drug Use: No  . Sexual Activity: Not on file   Other Topics Concern  . Not on file   Social History Narrative     Physical Exam: BP 112/60 mmHg  Pulse 68  Ht 4\' 11"  (1.499 m)  Wt 89 lb 8 oz (40.597 kg)  BMI 18.07 kg/m2 Constitutional: generally well-appearing Psychiatric: alert and oriented x3 Abdomen: soft, nontender, nondistended, no obvious ascites, no peritoneal signs, normal bowel sounds   Assessment and plan: 82 y.o. female with distal CBD stricture of unclear etiology  She currently has a removable, covered biliary stent in place which was placed by interventional radiology about one month ago. Her last cross-sectional imaging was at the same time. We have never seen clear tumor or mass and repeated biopsies, 3 times, have shown no clear malignancy in the bile duct stricture. She does understand that the suspicion is still high but she does have underlying malignancy but in truth this might be benign as well. She is not losing weight anymore. She is getting along just fine. She seems to understand the diagnostic dilemma we are in. She has been offered referral to pancreatic surgeon but she is not at all interested in any type of major operation which this would require. I plan is for repeat cross-sectional imaging in about 2 months from now which will be a 3 month interval from her last one. Based on that I will likely repeat ERCP, removed the previously placed stent, perhaps replace it if needed, recent able to biliary stricture. She knows to call sooner than 2 months she has any significant problems, specifically mention jaundice, fevers of unknown etiology, significant abdominal pains. She is also going to get a basic set of labs including CBC complete medical profile  coags.   Owens Loffler, MD [facility] 10/04/2015, 3:13 PM

## 2015-12-26 NOTE — Discharge Instructions (Signed)

## 2015-12-26 NOTE — Telephone Encounter (Signed)
appt has been made and letter mailed to the pt with date and time

## 2015-12-26 NOTE — Anesthesia Procedure Notes (Addendum)
Procedure Name: Intubation Date/Time: 12/26/2015 12:40 PM Performed by: Maxwell Caul Pre-anesthesia Checklist: Patient identified, Emergency Drugs available, Suction available and Patient being monitored Patient Re-evaluated:Patient Re-evaluated prior to inductionOxygen Delivery Method: Circle system utilized Preoxygenation: Pre-oxygenation with 100% oxygen Intubation Type: IV induction Ventilation: Mask ventilation without difficulty Tube type: Oral Tube size: 6.5 mm Number of attempts: 1 Airway Equipment and Method: Lighted stylet Placement Confirmation: positive ETCO2 and breath sounds checked- equal and bilateral Secured at: 21 cm Tube secured with: Tape Dental Injury: Teeth and Oropharynx as per pre-operative assessment  Difficulty Due To: Difficulty was anticipated, Difficult Airway- due to anterior larynx, Difficult Airway- due to limited oral opening and Difficult Airway- due to reduced neck mobility Future Recommendations: Recommend- induction with short-acting agent, and alternative techniques readily available Comments: Light wand intubation by Dr Seward Speck electively due to prior history of difficult intubation. Light wand visualized and ETT passed with ease.

## 2015-12-26 NOTE — Transfer of Care (Signed)
Immediate Anesthesia Transfer of Care Note  Patient: Summer Jones  Procedure(s) Performed: Procedure(s) with comments: ENDOSCOPIC RETROGRADE CHOLANGIOPANCREATOGRAPHY (ERCP) WITH PROPOFOL (N/A) - Spy Glass [employer] scientific rep needed SPYGLASS CHOLANGIOSCOPY (N/A)  Patient Location: PACU  Anesthesia Type:General  Level of Consciousness:  sedated, patient cooperative and responds to stimulation  Airway & Oxygen Therapy:Patient Spontanous Breathing and Patient connected to face mask oxgen  Post-op Assessment:  Report given to PACU RN and Post -op Vital signs reviewed and stable  Post vital signs:  Reviewed and stable  Last Vitals:  Filed Vitals:   12/26/15 1137  BP: 143/59  Pulse: 72  Temp: 36.4 C  Resp: 14    Complications: No apparent anesthesia complications

## 2015-12-26 NOTE — Anesthesia Preprocedure Evaluation (Signed)
Anesthesia Evaluation  Patient identified by MRN, date of birth, ID band Patient awake    Reviewed: Allergy & Precautions, H&P , Patient's Chart, lab work & pertinent test results, reviewed documented beta blocker date and time   Airway Mallampati: II  TM Distance: >3 FB Neck ROM: full    Dental no notable dental hx.    Pulmonary    Pulmonary exam normal breath sounds clear to auscultation       Cardiovascular hypertension, On Medications  Rhythm:regular Rate:Normal     Neuro/Psych    GI/Hepatic   Endo/Other    Renal/GU      Musculoskeletal   Abdominal   Peds  Hematology   Anesthesia Other Findings   Reproductive/Obstetrics                             Anesthesia Physical Anesthesia Plan  ASA: II  Anesthesia Plan: General   Post-op Pain Management:    Induction: Intravenous  Airway Management Planned: Oral ETT and Video Laryngoscope Planned  Additional Equipment:   Intra-op Plan:   Post-operative Plan: Extubation in OR  Informed Consent: I have reviewed the patients History and Physical, chart, labs and discussed the procedure including the risks, benefits and alternatives for the proposed anesthesia with the patient or authorized representative who has indicated his/her understanding and acceptance.   Dental Advisory Given and Dental advisory given  Plan Discussed with: CRNA and Surgeon  Anesthesia Plan Comments: (Old chart........Marland Kitchen  Anterior glottis, short TMD, limited mouth opening. Required glidescope and bougie stylet with multiple attempts.  Discussed general anesthesia, including possible nausea, instrumentation of airway, sore throat,pulmonary aspiration, etc. I asked if the were any outstanding questions, or  concerns before we proceeded. )        Anesthesia Quick Evaluation

## 2015-12-26 NOTE — Telephone Encounter (Signed)
-----   Message from [name], MD sent at 12/26/2015  2:48 PM EDT ----- She needs rov in 6-8 weeks, thanks

## 2015-12-26 NOTE — Op Note (Signed)
[facility] Patient Name: Summer Jones Procedure Date: 12/26/2015 MRN: [id number] Attending MD: [name] , MD Date of Birth: 1930/04/30 CSN: [id number] Age: 82 Admit Type: Outpatient Procedure:                ERCP Indications:              Stent change: Common bile duct Common stricture                            presented with painless jaundice, weight loss;                            December 2016, total bilirubin max 16.6. Initial                            CAT scan showed no clear pancreatic mass. She                            underwent endoscopic ultrasound and ERCP, Dr.                            [name]. This was complicated by a very small mouth                            inability to pass the linear echoendoscope. Vague,                            masslike area in the head of the pancreas was                            noted. ERCP was unsuccessful as well, inability to                            pass a very tight distal bile duct stricture with                            the wire. Eventual percutaneous internal/external                            drain placed by interventional radiology. Cytology,                            pathology with forceps biopsies through                            interventional radiology on 3 separate occasions                            have never shown clear malignancy but a few                            'atypical cells' were noted. Most recent sample of  the CBD stricture was 09/05/2015. At that time                            fully removable, covered biliary stent was placed                            by interventional radiology. The int/ext drain was                            removed about a week later. Most recent TBili was                            09/05/2015, was 2.0. CA 19-9, about 100 Providers:                [name], MD, Dortha Schwalbe, RN, Corliss Parish, Technician Referring MD:              Medicines:                General Anesthesia, Indomethacin 123XX123 mg PR Complications:            No immediate complications. Estimated blood loss:                            None Estimated Blood Loss:     Estimated blood loss: none. Procedure:                Pre-Anesthesia Assessment:                           - Prior to the procedure, a History and Physical                            was performed, and patient medications and                            allergies were reviewed. The patient's tolerance of                            previous anesthesia was also reviewed. The risks                            and benefits of the procedure and the sedation                            options and risks were discussed with the patient.                            All questions were answered, and informed consent                            was obtained. Prior Anticoagulants: The patient has  taken no previous anticoagulant or antiplatelet                            agents. ASA Grade Assessment: II - A patient with                            mild systemic disease. After reviewing the risks                            and benefits, the patient was deemed in                            satisfactory condition to undergo the procedure.                           After obtaining informed consent, the scope was                            passed under direct vision. Throughout the                            procedure, the patient's blood pressure, pulse, and                            oxygen saturations were monitored continuously. The                            [id number]) scope was introduced through                            the mouth, and used to inject contrast into and                            used to inject contrast into the bile duct. The                            ERCP was accomplished without difficulty. The                             patient tolerated the procedure well. Scope In: Scope Out: Findings:      A biliary stent was visible on the scout film. The esophagus was       successfully intubated under direct vision. The scope was advanced to a       normal major papilla in the descending duodenum without detailed       examination of the pharynx, larynx and associated structures, and upper       GI tract. The upper GI tract was grossly normal. The duodenoscope       positioning was not ideal, needed to torque leftward throughout much of       the procedure. The previously IR placed metal biliary stent was removed       with forceps. The stent was sent for cytology. The bile duct was then       cannulated with a 44 Autotome over a .  035 hyrawire and contrast was       injected. There was a slight stricture of the mid CBD (about 1-2cm       long). This was incomplete. The stricture was brushed for cytology. The       Spy Glass choledochoscope was introduced into the bile duct but only       after a biliary sphincterotomy was performed over the biliary wire. The       brief views after flushing and suctioning showed no clear tumors however       the views were truly brief as position was less than ideal with the       duodenoscope and access to the bile duct was lost several times,       requireing recannulation with wire. Finally a 6cm long, 34mm diameter       fully covered metal stent was then replaced across the stricture in good       position. The most distal 1cm of the stent extended into the duodenum. Impression:               - Previous IR placed biliary stent was removed,                            sent for cytology.                           - Cholangiogram showed persistent but mild mid-CBD                            stricture that was brushed for cytology.                           - Biliary sphincterotomy performed to allow access                            with Spy Glass  choledochoscope.                           - Brief, incomplete Spy Glass views showed no clear                            tumor, cancer.                           - 6cm long 30mm diameter fully covered, removable                            biliary stent was then placed across the stricture. Moderate Sedation:      N/A- Per Anesthesia Care      N/A- Per Anesthesia Care Recommendation:           - Patient has a contact number available for                            emergencies. The signs and symptoms of potential                            delayed complications were discussed  with the                            patient. Return to normal activities tomorrow.                            Written discharge instructions were provided to the                            patient.                           - Await cytology results.                           - Follow up appointment in my office in 6-7 weeks. Procedure Code(s):        --- Professional ---                           [phone], Endoscopic retrograde                            cholangiopancreatography (ERCP); with removal and                            exchange of stent(s), biliary or pancreatic duct,                            including pre- and post-dilation and guide wire                            passage, when performed, including sphincterotomy,                            when performed, each stent exchanged Diagnosis Code(s):        --- Professional ---                           K83.1, Obstruction of bile duct                           Z46.59, Encounter for fitting and adjustment of                            other gastrointestinal appliance and device CPT copyright 2016 [employer]. All rights reserved. The codes documented in this report are preliminary and upon coder review may  be revised to meet current compliance requirements. [name], MD 12/26/2015 2:45:59 PM This report has been signed  electronically. Number of Addenda: 0

## 2015-12-27 ENCOUNTER — Encounter (HOSPITAL_COMMUNITY): Payer: Self-pay | Admitting: Gastroenterology

## 2016-01-09 NOTE — Anesthesia Postprocedure Evaluation (Signed)
Anesthesia Post Note  Patient: Summer Jones  Procedure(s) Performed: Procedure(s) (LRB): ENDOSCOPIC RETROGRADE CHOLANGIOPANCREATOGRAPHY (ERCP) WITH PROPOFOL (N/A) SPYGLASS CHOLANGIOSCOPY (N/A)  Patient location during evaluation: PACU Anesthesia Type: General Level of consciousness: sedated Pain management: satisfactory to patient Vital Signs Assessment: post-procedure vital signs reviewed and stable Respiratory status: spontaneous breathing Cardiovascular status: stable Anesthetic complications: no     Last Vitals:  Filed Vitals:   12/26/15 1545 12/26/15 1550  BP:  127/55  Pulse: 80 81  Temp:    Resp: 14 24    Last Pain: There were no vitals filed for this visit. Pain Goal:                 Riccardo Dubin

## 2016-03-10 ENCOUNTER — Other Ambulatory Visit (INDEPENDENT_AMBULATORY_CARE_PROVIDER_SITE_OTHER): Payer: Medicare Other

## 2016-03-10 ENCOUNTER — Ambulatory Visit (INDEPENDENT_AMBULATORY_CARE_PROVIDER_SITE_OTHER): Payer: Medicare Other | Admitting: Gastroenterology

## 2016-03-10 ENCOUNTER — Encounter: Payer: Self-pay | Admitting: Gastroenterology

## 2016-03-10 VITALS — BP 110/58 | HR 72 | Ht 59.0 in | Wt 90.6 lb

## 2016-03-10 DIAGNOSIS — K831 Obstruction of bile duct: Secondary | ICD-10-CM

## 2016-03-10 LAB — COMPREHENSIVE METABOLIC PANEL
ALK PHOS: 172 U/L — AB (ref 39–117)
ALT: 44 U/L — AB (ref 0–35)
AST: 49 U/L — AB (ref 0–37)
Albumin: 4.4 g/dL (ref 3.5–5.2)
BILIRUBIN TOTAL: 0.5 mg/dL (ref 0.2–1.2)
BUN: 10 mg/dL (ref 6–23)
CO2: 29 mEq/L (ref 19–32)
Calcium: 9.7 mg/dL (ref 8.4–10.5)
Chloride: 99 mEq/L (ref 96–112)
Creatinine, Ser: 0.73 mg/dL (ref 0.40–1.20)
GFR: 80.74 mL/min (ref >60.00)
GLUCOSE: 103 mg/dL — AB (ref 70–99)
Potassium: 4.4 mEq/L (ref 3.5–5.1)
SODIUM: 133 meq/L — AB (ref 135–145)
TOTAL PROTEIN: 7.3 g/dL (ref 6.0–8.3)

## 2016-03-10 NOTE — Patient Instructions (Addendum)
You will have labs checked today in the basement lab.  Please head down after you check out with the front desk  (cmet, CA 19-9). You will be set up for a CT scan of abdomen and pelvis with IV and oral contrast (pancreatic protocol)---this will be in 05/2016 (6 months from your last CT scan).  We will call you to set this up closer to October.

## 2016-03-10 NOTE — Progress Notes (Signed)
Review of pertinent gastrointestinal problems: 1. Common bile duct stricture presented with painless jaundice, weight loss; December 2016, total bilirubin max 16.6.  Initial CAT scan showed no clear pancreatic mass. She underwent endoscopic ultrasound and ERCP, Dr. [name]. This was complicated by a very small mouth inability to pass the linear echoendoscope. Vague, masslike area in the head of the pancreas was noted. ERCP was unsuccessful as well, inability to pass a very tight distal bile duct stricture with the wire. Eventual percutaneous internal/external drain placed by interventional radiology. Cytology, pathology with forceps biopsies through interventional radiology on 3 separate occasions have never shown clear malignancy but a few 'atypical cells' were noted.  Most recent sample of the CBD stricture was 09/05/2015. At that time fully removable, covered biliary stent was placed by interventional radiology. The int/ext drain was removed about a week later. Most recent TBili was 09/05/2015, was 2.0.  CA 19-9 about 100. ERCP 12/2015 Dr. [name], removed previously placed IR stent, persistent but mild mid-CBD stricture was brushed, "no malignant cells," incomplete Spy Glass evaluation was negative, placed 6cm long 44mm diameter fully covered SEMS.    HPI: This is a very pleasant 82 year old woman whom I last saw 2 months ago the time of an ERCP. See those results summarized above  Chief complaint is bile duct stricture  She has lower abd cramping sensation, lower abd squeezing.  Post prandial growling of stomach.   This is intermittent and predates her bile duct problems  Previously had constipation issues, but none in several months.  She's up 2 pounds.  No fevers or chills.  She's had no fevers no chills, no overt jaundice.   Past Medical History:  Diagnosis Date  . Arthritis   . Common bile duct (CBD) obstruction   . Difficult intubation Anterior glottis, short TMD, limited mouth opening.  Required glidescope and bougie stylet with multiple attempts.  . Diverticular disease   . Diverticulosis   . Hypertension   . Hypothyroidism   . Jaundice   . Mitral valve prolapse   . Thyroid disease     Past Surgical History:  Procedure Laterality Date  . BREAST BIOPSY    . ENDOSCOPIC RETROGRADE CHOLANGIOPANCREATOGRAPHY (ERCP) WITH PROPOFOL N/A 08/08/2015   Procedure: ENDOSCOPIC RETROGRADE CHOLANGIOPANCREATOGRAPHY (ERCP) WITH PROPOFOL;  Surgeon: [name], MD;  Location: [facility];  Service: Endoscopy;  Laterality: N/A;  . 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 EUS N/A 08/08/2015   Procedure: UPPER ENDOSCOPIC ULTRASOUND (EUS) RADIAL;  Surgeon: [name], MD;  Location: [facility];  Service: Endoscopy;  Laterality: N/A;  . INGUINAL HERNIA REPAIR     right  . SPYGLASS CHOLANGIOSCOPY N/A 12/26/2015   Procedure: [id number] CHOLANGIOSCOPY;  Surgeon: [name], MD;  Location: [facility];  Service: Endoscopy;  Laterality: N/A;  . TONSILLECTOMY      Current Outpatient Prescriptions  Medication Sig Dispense Refill  . amLODipine (NORVASC) 10 MG tablet Take 10 mg by mouth at bedtime.     Marland Kitchen HYDROcodone-acetaminophen (NORCO/VICODIN) 5-325 MG tablet Take 2 tablets by mouth every 4 (four) hours as needed. (Patient taking differently: Take 2 tablets by mouth every 4 (four) hours as needed (For pain.). ) 10 tablet 0  . levothyroxine (SYNTHROID, LEVOTHROID) 25 MCG tablet Take 25 mcg by mouth daily before  breakfast.    . lisinopril (PRINIVIL,ZESTRIL) 5 MG tablet Take 5 mg by mouth at bedtime.     . Multiple Vitamins-Minerals (MULTIVITAMIN GUMMIES ADULT) CHEW Chew 1 tablet by mouth daily.    . polyethylene glycol powder  (GLYCOLAX/MIRALAX) powder Take 17 g by mouth as needed for mild constipation.     . Tetrahydroz-Polyvinyl Al-Povid (MURINE TEARS PLUS OP) Place 1 drop into both eyes daily as needed (For dry eyes.).     No current facility-administered medications for this visit.     Allergies as of 03/10/2016 - Review Complete 03/10/2016  Allergen Reaction Noted  . Sulfamethoxazole Nausea Only 08/13/2015    Family History  Problem Relation Age of Onset  . Breast cancer Sister   . Heart disease Father     Social History   Social History  . Marital status: Married    Spouse name: N/A  . Number of children: 2  . Years of education: N/A   Occupational History  . Retired    Social History Main Topics  . Smoking status: Never Smoker  . Smokeless tobacco: Never Used  . Alcohol use No     Comment: none since Thanksgiving  . Drug use: No  . Sexual activity: Not on file   Other Topics Concern  . Not on file   Social History Narrative  . No narrative on file     Physical Exam: BP (!) 110/58 (BP Location: Left Arm, Patient Position: Sitting, Cuff Size: Normal)   Pulse 72   Ht 4\' 11"  (1.499 m)   Wt 90 lb 9.6 oz (41.1 kg)   BMI 18.30 kg/m  Constitutional: generally well-appearing Psychiatric: alert and oriented x3 Abdomen: soft, nontender, nondistended, no obvious ascites, no peritoneal signs, normal bowel sounds   Assessment and plan: 82 y.o. female with Bile duct stricture  She understands that there is still a chance that she has underlying biliary malignancy. We have failed to make the diagnosis after 4 brushings, biopsies of the stricture. She is not interested in surgery. She has made it very clear. Currently I'll duct is decompressed with a covered, removal metal stent which I placed 2 months ago. She'll be on the look out for occlusion symptoms such as return of jaundice, fevers, significant abdominal pains. She'll get a set of liver tests today as well as a CA-19-9 and we will  arrange for repeat CT scan in October of this year which will be 6 months from her last cross-sectional imaging to check for significant interval change.   Owens Loffler, MD [facility] 03/10/2016, 1:48 PM

## 2016-03-11 LAB — CANCER ANTIGEN 19-9: CA 19 9: 64 U/mL — AB (ref <34)

## 2016-05-18 ENCOUNTER — Telehealth: Payer: Self-pay

## 2016-05-18 DIAGNOSIS — K831 Obstruction of bile duct: Secondary | ICD-10-CM

## 2016-05-18 NOTE — Telephone Encounter (Signed)
-----   Message from Barron Alvine, RN sent at 03/10/2016  2:01 PM EDT ----- Pt to get CT in 05/2016

## 2016-05-19 IMAGING — CT CT ABD-PELV W/ CM
2 of 5 series · 15 of 46 positions shown, 17 images · IV contrast (OMNIPAQUE 300)
Comparison: CT of the abdomen and pelvis performed 08/09/2015

CLINICAL DATA: Acute onset of generalized abdominal pain, status
post internal/external biliary drainage catheter placement. Initial
encounter.

EXAM:
CT ABDOMEN AND PELVIS WITH CONTRAST
TECHNIQUE: Multidetector CT imaging of the abdomen and pelvis was performed
using the standard protocol following bolus administration of
intravenous contrast.
CONTRAST:  80mL OMNIPAQUE IOHEXOL 300 MG/ML  SOLN

[Series 2: abd/pel with · axial · 0.63mm/px · z∈[-781,-421]mm · 12 of 80 slices shown, 14 images]
[im 4/80  soft-tissue]
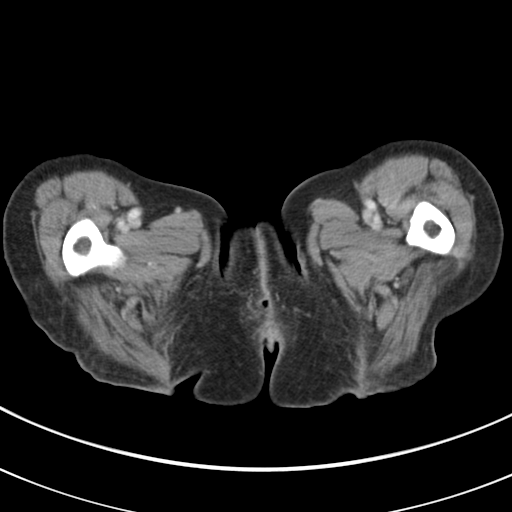
[im 4/80  bone]
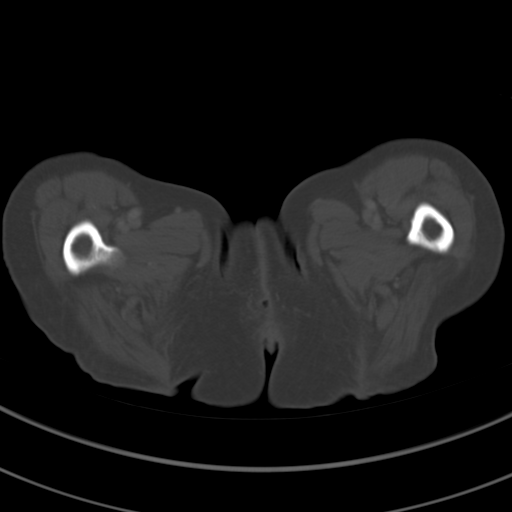
[im 12/80  soft-tissue]
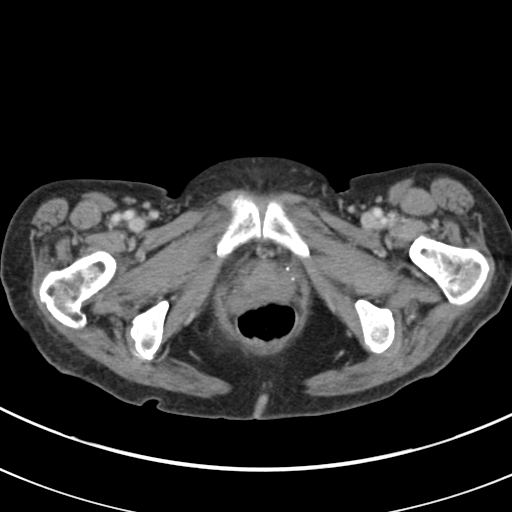
[im 16/80  soft-tissue]
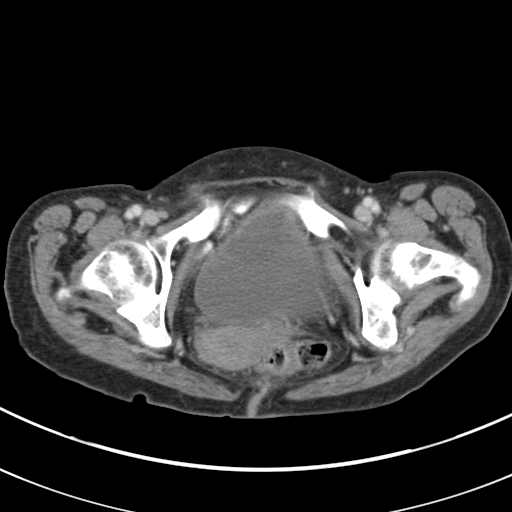
[im 24/80  soft-tissue]
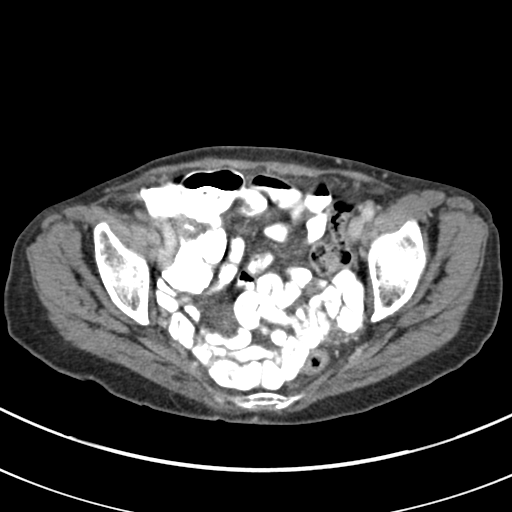
[im 32/80  soft-tissue]
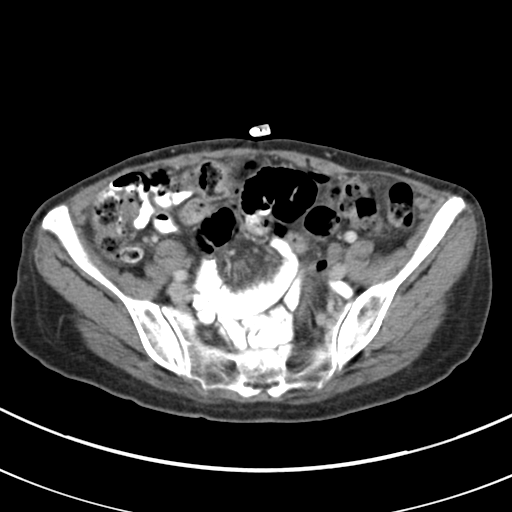
[im 36/80  soft-tissue]
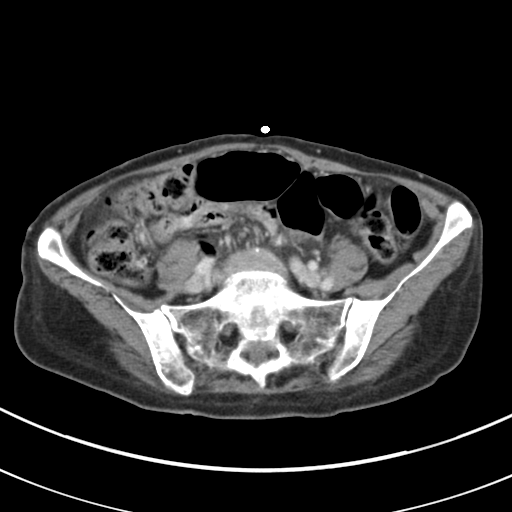
[im 44/80  soft-tissue]
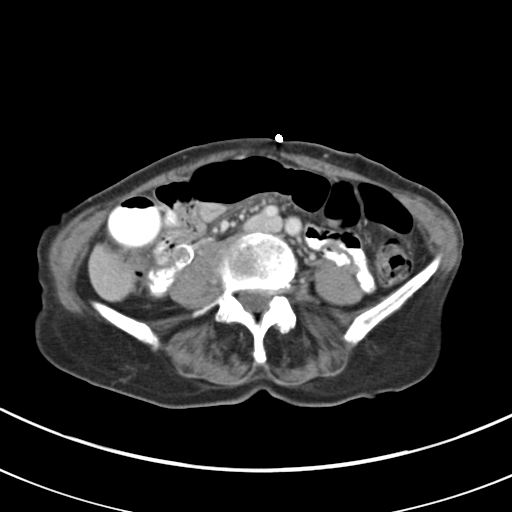
[im 48/80  soft-tissue]
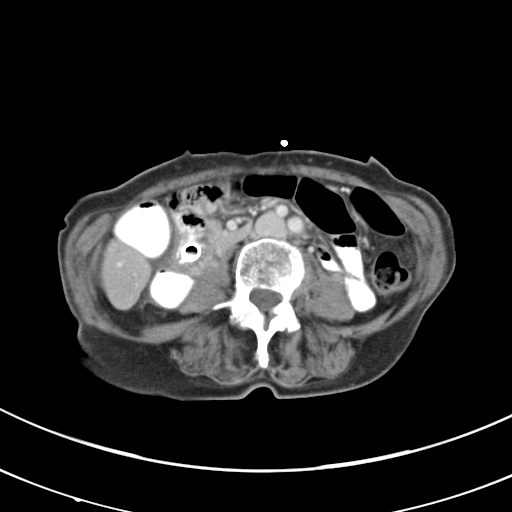
[im 56/80  soft-tissue]
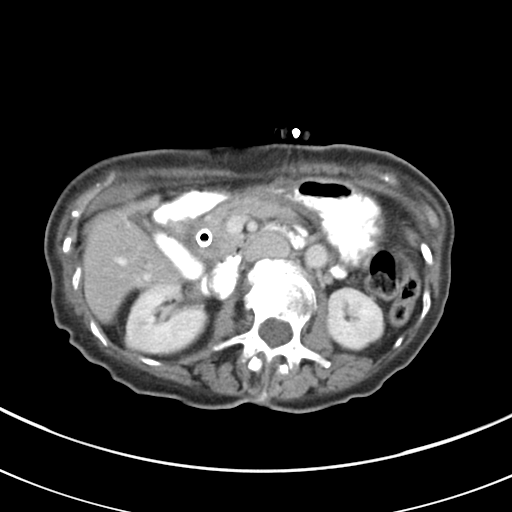
[im 56/80  bone]
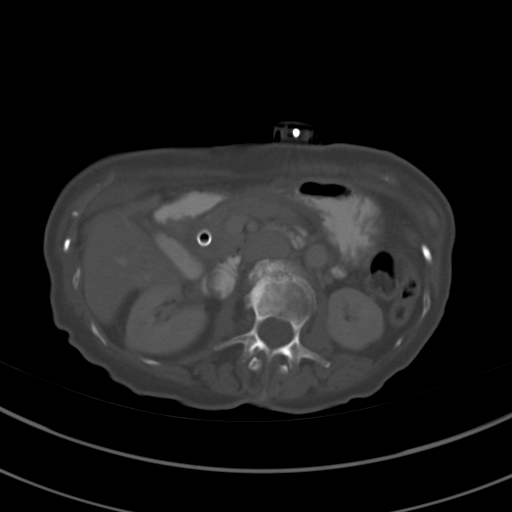
[im 64/80  soft-tissue]
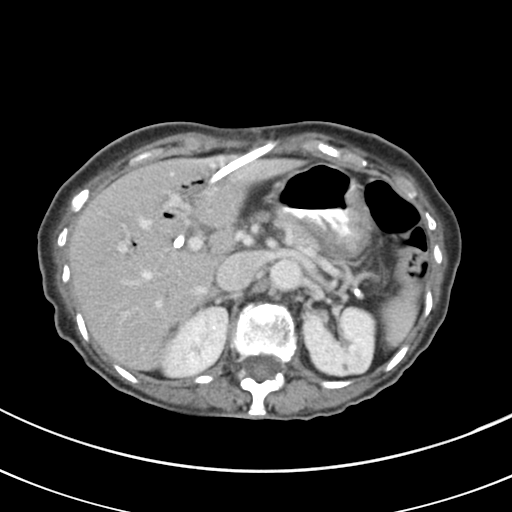
[im 68/80  soft-tissue]
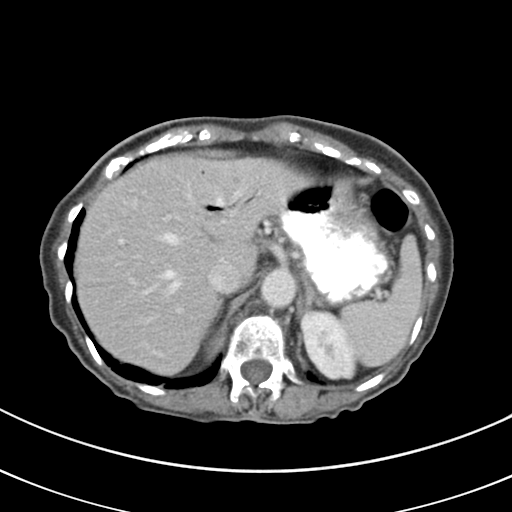
[im 76/80  soft-tissue]
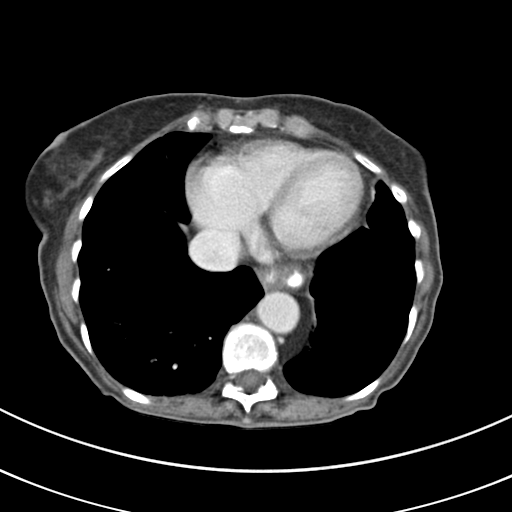

[Series 5: coronal a/|p · coronal · 0.56mm/px · 3 of 68 slices shown]
[im 23/68  soft-tissue]
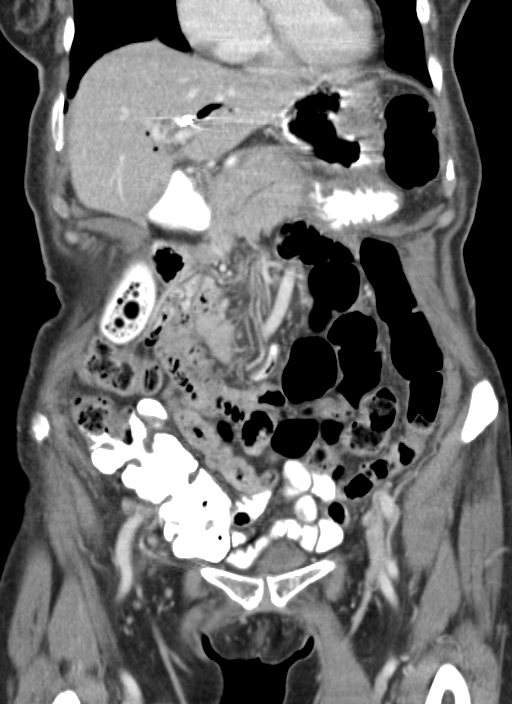
[im 30/68  soft-tissue]
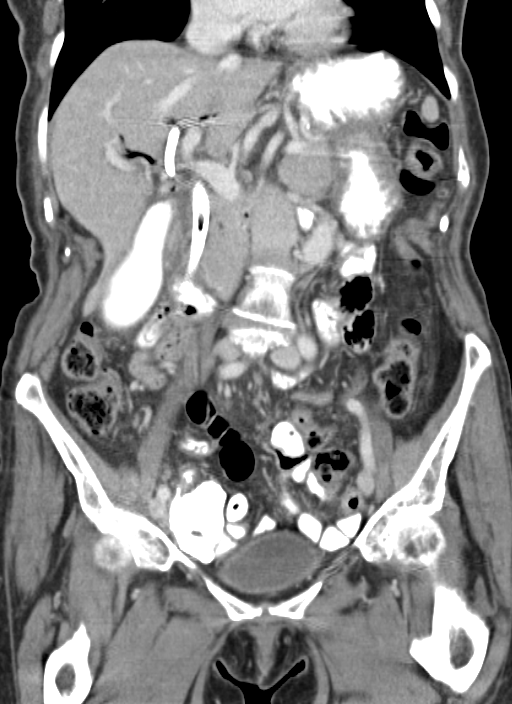
[im 38/68  soft-tissue]
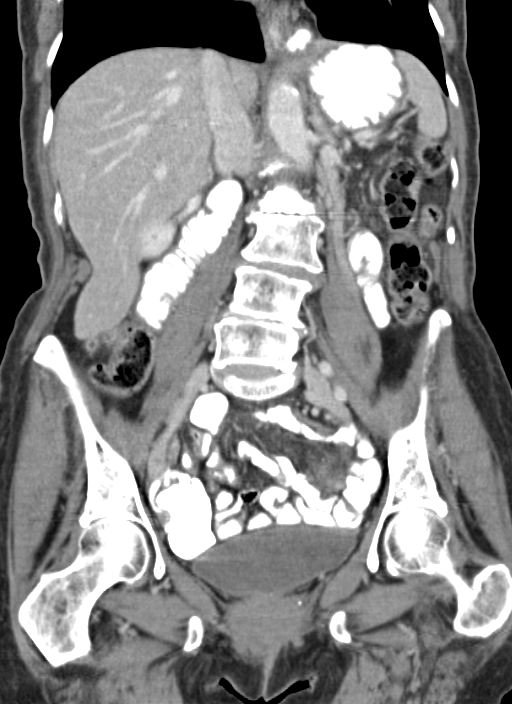

[15 of 46 positions shown; findings below may reference images not displayed]

FINDINGS: The visualized lung bases are clear.

Scattered pneumobilia is slightly more prominent than on the prior
study, reflecting the common bile duct stent. The external biliary
drainage catheter is noted in expected position, adjacent to the
upper aspect of the common bile duct stent.

Vague soft tissue edema is suggested about the pancreatic head and
second segment of the duodenum. This is relatively stable in
appearance and likely reflects the recent procedure. There is no
definite evidence for pancreatitis at this time.

The liver and spleen are otherwise unremarkable. The adrenal glands
are within normal limits.

The kidneys are unremarkable in appearance. There is no evidence of
hydronephrosis. No renal or ureteral stones are seen. No perinephric
stranding is appreciated.

The small bowel is unremarkable in appearance. The stomach is within
normal limits. No acute vascular abnormalities are seen.

The appendix is not definitely seen; there is no evidence of
appendicitis. Minimal diverticulosis is noted at the cecum, without
evidence of diverticulitis. The sigmoid colon is mildly redundant.

The bladder is mildly distended and grossly unremarkable. The uterus
is grossly unremarkable in appearance. No suspicious adnexal masses
are seen. No inguinal lymphadenopathy is seen.

No acute osseous abnormalities are identified. There is mild grade 1
retrolisthesis of L1 on L2 and of L2 on L3, and mild grade 1
anterolisthesis of L5 on S1. Underlying mild facet disease is noted.
IMPRESSION: 1. No acute abnormality seen to explain the patient's symptoms.
2. Scattered pneumobilia is slightly more prominent, status post
placement of a common bile duct stent. External biliary drainage
catheter noted in expected position, adjacent to the upper aspect of
the common bile duct stent.
3. Vague soft tissue edema about the pancreatic head and second
segment of duodenum is relatively stable from the prior CT, and
likely reflects the recent procedure.
4. Minimal diverticulosis at the cecum, without evidence of
diverticulitis.
5. Mild degenerative change along the lumbar spine.

## 2016-05-20 NOTE — Telephone Encounter (Signed)
Left message on machine to call back  

## 2016-05-21 NOTE — Telephone Encounter (Signed)
Pt has been instructed and will have labs prior to the app.    You have been scheduled for a CT scan of the abdomen and pelvis at [facility] ([address]---this is in the same building as Press [facility]).   You are scheduled on 05/29/16 at 4 pm. You should arrive 1 1/2 hours early minutes for your appointment to allow time for registration. Please follow the written instructions below on the day of your exam:  WARNING: IF YOU ARE ALLERGIC TO IODINE/X-RAY DYE, PLEASE NOTIFY RADIOLOGY IMMEDIATELY AT [phone]! YOU WILL BE GIVEN A 13 HOUR PREMEDICATION PREP.  1) Do not eat or drink anything after 12 noon (4 hours prior to your test)   You may take any medications as prescribed with a small amount of water except for the following: Metformin, Glucophage, Glucovance, Avandamet, Riomet, Fortamet, Actoplus Met, Janumet, Glumetza or Metaglip. The above medications must be held the day of the exam AND 48 hours after the exam.   Depending on your individual set of symptoms, you may also receive an intravenous injection of x-ray contrast/dye. Plan on being at [facility] for 30 minutes or longer, depending on the type of exam you are having performed.  This test typically takes 30-45 minutes to complete.  If you have any questions regarding your exam or if you need to reschedule, you may call the CT department at [phone] between the hours of 8:00 am and 5:00 pm, Monday-Friday.  _____________________________________________________  Dennis Bast will need to come in a day or two prior for lab work.

## 2016-05-22 ENCOUNTER — Telehealth: Payer: Self-pay | Admitting: Gastroenterology

## 2016-05-22 ENCOUNTER — Other Ambulatory Visit (INDEPENDENT_AMBULATORY_CARE_PROVIDER_SITE_OTHER): Payer: Medicare Other

## 2016-05-22 ENCOUNTER — Other Ambulatory Visit: Payer: Self-pay

## 2016-05-22 ENCOUNTER — Ambulatory Visit (INDEPENDENT_AMBULATORY_CARE_PROVIDER_SITE_OTHER)
Admission: RE | Admit: 2016-05-22 | Discharge: 2016-05-22 | Disposition: A | Discharge status: Home / Self Care | Payer: Medicare Other | Source: Ambulatory Visit | Attending: Gastroenterology | Admitting: Gastroenterology

## 2016-05-22 DIAGNOSIS — K831 Obstruction of bile duct: Secondary | ICD-10-CM

## 2016-05-22 LAB — CREATININE, SERUM: CREATININE: 0.75 mg/dL (ref 0.40–1.20)

## 2016-05-22 LAB — BUN: BUN: 13 mg/dL (ref 6–23)

## 2016-05-22 MED ORDER — IOPAMIDOL (ISOVUE-300) INJECTION 61%
100.0000 mL | Freq: Once | INTRAVENOUS | Status: AC | PRN
  Administered 2016-05-22: 80 mL via INTRAVENOUS

## 2016-05-22 MED ORDER — HYOSCYAMINE SULFATE 0.125 MG PO TABS: ORAL_TABLET | ORAL | 1 refills | Status: DC

## 2016-05-22 NOTE — Telephone Encounter (Signed)
Left a message to call. Rx has been transmitted to the pharmacy.

## 2016-05-22 NOTE — Telephone Encounter (Signed)
Patient calls with concerns about the abdominal discomfort she is experiencing. Described as an off and on cramping or spasm. It leaves her feeling sore in her lower abdomen. Normal bowel movements. Afebrile. No nausea. She has tried eliminating gas causing foods such as broccoli. Gas-X has not helped. She has taken children's Ibuprofen but not regularly. She has an active My Chart and is willing to receive communication this way. Her CT scan is today. She is very anxious to hear the results.

## 2016-05-22 NOTE — Telephone Encounter (Signed)
Confirmed her cell phone number, but she has decided not to go on vacation until she hears her CT results. She does agree to try Levsin. Listed phone numbers are correct. Thank you

## 2016-05-22 NOTE — Telephone Encounter (Signed)
CT hasnt' been done yet.  Results may not be back until later tonight or tomorrow.    Can you call her in levsin 0.125mg  SL, take one to two pills tid as needed for what may be IBS related spasms.  I'll get in touch with her when I see her CT report. Can you confirm her cell phone number so I can call over weekend if she is out of town.

## 2016-05-24 NOTE — Telephone Encounter (Signed)
We spoke on phone yesterday about CT

## 2016-05-26 IMAGING — XA IR REMOVE BILIARY DRAIN
2 series · 5 of 5 positions shown · non-contrast
Comparison: none

INDICATION: 87-year-old female with a history of obstructed jaundice from a mid
common bile duct stricture. Thus far, biopsy specimens have been
positive for atypical cells once but otherwise benign despite
extensive and aggressive percutaneous transhepatic biopsying.

[Series 2: fl - angio · 4 of 72 frames shown]
[frame 11/72]
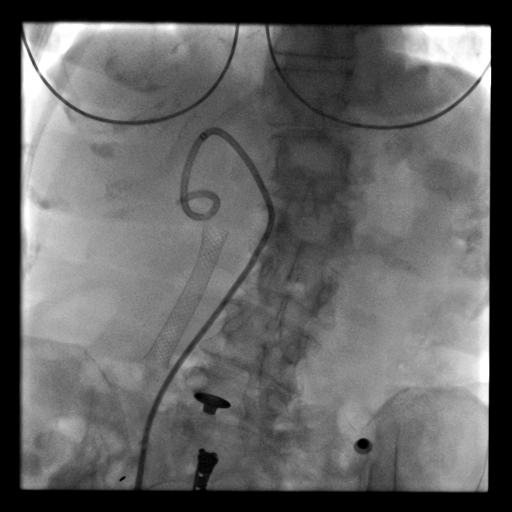
[frame 20/72]
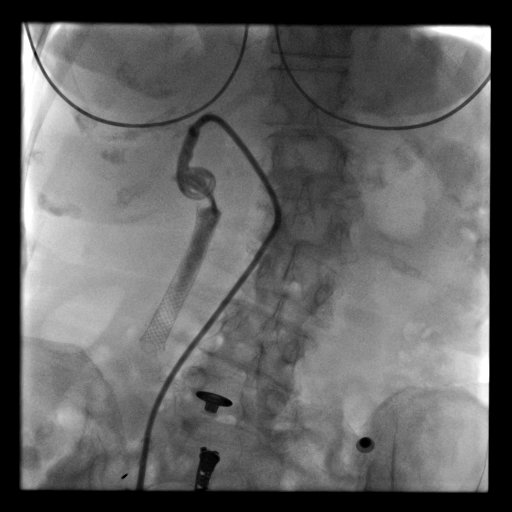
[frame 37/72]
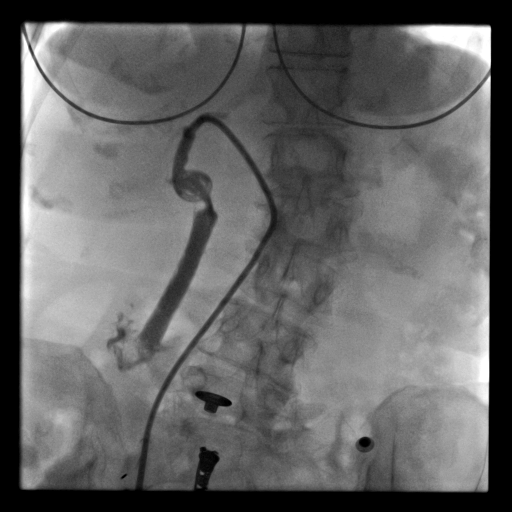
[frame 62/72]
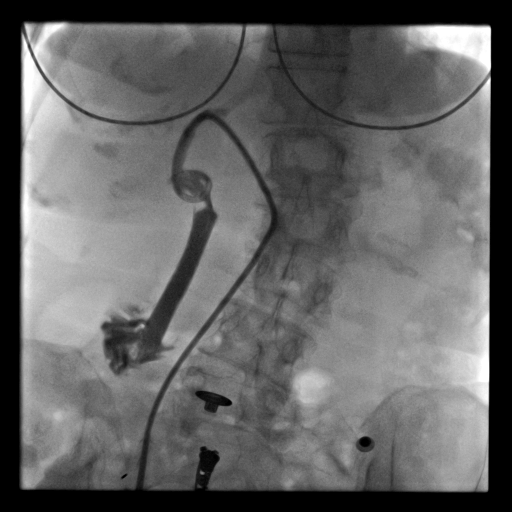

[Series 300: ir cholangiogram existing tube · 1 of 1 slices shown]
[im 1/1]
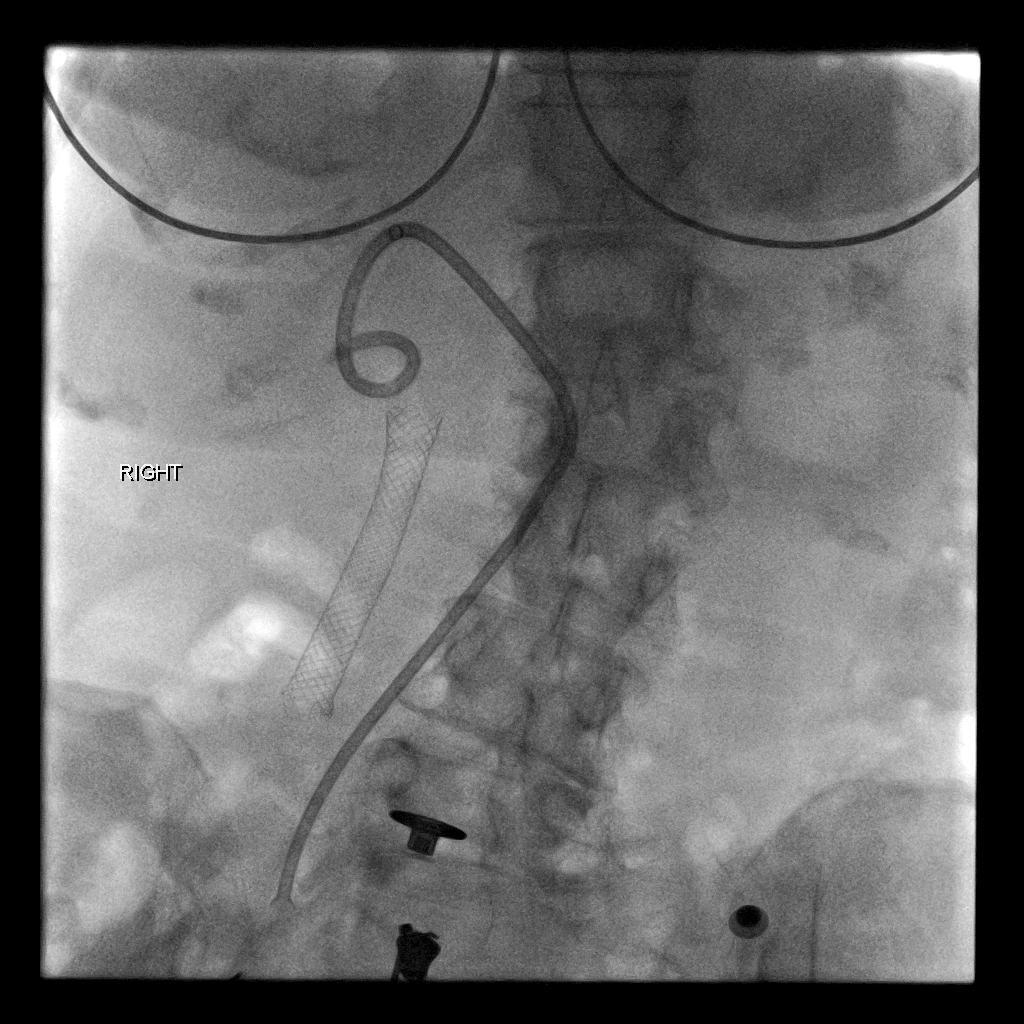

[5 of 5 positions shown; findings below may reference images not displayed]

Last week she had a biliary stent placed in her internal external
drainage catheter removed and replaced with an external drainage
catheter in the hepatic confluence proximal to the stent. She has
subsequently done very well. She denies postprandial pain, right
upper quadrant pain, nausea or vomiting.

EXAM:
CHOLANGIOGRAM VIA EXISTING CATHETER

MEDICATIONS:
None

ANESTHESIA/SEDATION:
None

FLUOROSCOPY TIME:  Fluoroscopy Time: 0 minutes 12 seconds (4 mGy).

COMPLICATIONS:
None immediate.

PROCEDURE:
A gentle hand injection of contrast material demonstrates a widely
patent and well positioned wall flex stent. The stent has completely
expanded in the interim since placement. There is no evidence of
biliary ductal dilatation.

The external off biliary drain was cut and removed over an Amplatz
wire. A sterile bandage was applied.
IMPRESSION: 1. Completely open, patent and well positioned wall flex stent. The
patient has tolerated her capped trial with ease.
2. The percutaneous biliary drain was removed.

## 2016-05-29 ENCOUNTER — Inpatient Hospital Stay: Admission: RE | Admit: 2016-05-29 | Payer: Medicare Other | Source: Ambulatory Visit

## 2016-06-03 ENCOUNTER — Inpatient Hospital Stay: Admission: RE | Admit: 2016-06-03 | Payer: Medicare Other | Source: Ambulatory Visit

## 2016-08-18 ENCOUNTER — Ambulatory Visit: Payer: Medicare Other | Admitting: Gastroenterology

## 2016-09-15 ENCOUNTER — Other Ambulatory Visit (INDEPENDENT_AMBULATORY_CARE_PROVIDER_SITE_OTHER): Payer: Medicare Other

## 2016-09-15 ENCOUNTER — Ambulatory Visit (INDEPENDENT_AMBULATORY_CARE_PROVIDER_SITE_OTHER): Payer: Medicare Other | Admitting: Gastroenterology

## 2016-09-15 ENCOUNTER — Encounter: Payer: Self-pay | Admitting: Gastroenterology

## 2016-09-15 VITALS — BP 120/60 | HR 78 | Ht 59.0 in | Wt 95.2 lb

## 2016-09-15 DIAGNOSIS — K831 Obstruction of bile duct: Secondary | ICD-10-CM

## 2016-09-15 LAB — COMPREHENSIVE METABOLIC PANEL
ALBUMIN: 4.4 g/dL (ref 3.5–5.2)
ALK PHOS: 124 U/L — AB (ref 39–117)
ALT: 27 U/L (ref 0–35)
AST: 30 U/L (ref 0–37)
BILIRUBIN TOTAL: 0.4 mg/dL (ref 0.2–1.2)
BUN: 13 mg/dL (ref 6–23)
CALCIUM: 9.6 mg/dL (ref 8.4–10.5)
CO2: 31 mEq/L (ref 19–32)
Chloride: 100 mEq/L (ref 96–112)
Creatinine, Ser: 0.72 mg/dL (ref 0.40–1.20)
GFR: 81.94 mL/min (ref >60.00)
Glucose, Bld: 121 mg/dL — ABNORMAL HIGH (ref 70–99)
Potassium: 4.4 mEq/L (ref 3.5–5.1)
Sodium: 135 mEq/L (ref 135–145)
TOTAL PROTEIN: 7.4 g/dL (ref 6.0–8.3)

## 2016-09-15 LAB — CBC WITH DIFFERENTIAL/PLATELET
BASOS ABS: 0 10*3/uL (ref 0.0–0.1)
Basophils Relative: 0.8 % (ref 0.0–3.0)
Eosinophils Absolute: 0.2 10*3/uL (ref 0.0–0.7)
Eosinophils Relative: 3.4 % (ref 0.0–5.0)
HEMATOCRIT: 38 % (ref 36.0–46.0)
Hemoglobin: 12.8 g/dL (ref 12.0–15.0)
LYMPHS PCT: 22.9 % (ref 12.0–46.0)
Lymphs Abs: 1.2 10*3/uL (ref 0.7–4.0)
MCHC: 33.6 g/dL (ref 30.0–36.0)
MCV: 93.6 fl (ref 78.0–100.0)
MONOS PCT: 12.5 % — AB (ref 3.0–12.0)
Monocytes Absolute: 0.7 10*3/uL (ref 0.1–1.0)
Neutro Abs: 3.3 10*3/uL (ref 1.4–7.7)
Neutrophils Relative %: 60.4 % (ref 43.0–77.0)
Platelets: 270 10*3/uL (ref 150.0–400.0)
RBC: 4.06 Mil/uL (ref 3.87–5.11)
RDW: 13.5 % (ref 11.5–15.5)
WBC: 5.4 10*3/uL (ref 4.0–10.5)

## 2016-09-15 NOTE — Patient Instructions (Addendum)
  You will have labs checked today in the basement lab.  Please head down after you check out with the front desk  (cmet, cbc)  CT scan abdomen in June 2018 (follow up of bile duct stricture, check for pancreatic masses)  We will call you when its time to set up the CT scan.   Call if any jaundice, dark urine, unexplained fevers, worsening of your abdominal pains.

## 2016-09-15 NOTE — Progress Notes (Signed)
Review of pertinent gastrointestinal problems: 1. Common bile duct stricture presented with painless jaundice, weight loss; December 2016, total bilirubin max 16.6. Initial CT scan showed no clear pancreatic mass. She underwent endoscopic ultrasound and ERCP, Dr. [name]. This was complicated by a very small mouth inability to pass the linear echoendoscope. Vague, masslike area in the head of the pancreas was noted. ERCP was unsuccessful as well, inability to pass a very tight distal bile duct stricture with the wire. Eventual percutaneous internal/external drain placed by interventional radiology. Cytology, pathology with forcep biopsies through interventional radiology on 3 separate occasions have never shown clear malignancy but a few 'atypical cells' were noted. Most recent sample of the CBD stricture was 09/05/2015. At that time fully removable, covered biliary stent was placed by interventional radiology. The int/ext drain was removed about a week later. TBili was 09/05/2015, was 2.0. CA 19-9 about 100. ERCP 12/2015 Dr. [name], removed previously placed IR stent, persistent but mild mid-CBD stricture was brushed, "no malignant cells," incomplete Spy Glass evaluation was negative, placed 6cm long 105mm diameter fully covered SEMS.  CT scan 05/2016: CBD stent in place, no pancreatic masses.   HPI: This is a very pleasant 83 year old woman whom I last saw 6 or 7 months ago.  Chief complaint is bile duct stricture, intermittent abdominal pains  Her weight is up 5 pounds since visit here 02/2016 (same scale)  Still has intermittent spasm like pains.  Can be fine for  2-3 weeks without any pains and then she will have intermittent spasms of lower abdominal pain that feel like a squeezing sensation, lasting several hours at a time. These are not associated with nausea or vomiting but she can feel very tired afterwards. She has no associated fevers or chills.  She has had no jaundice, discoloration of her  urine.  She is eating well without significant abdominal pains.  ROS: complete GI ROS as described in HPI.  Constitutional:  No unintentional weight loss   Past Medical History:  Diagnosis Date  . Arthritis   . Common bile duct (CBD) obstruction   . Difficult intubation Anterior glottis, short TMD, limited mouth opening. Required glidescope and bougie stylet with multiple attempts.  . Diverticular disease   . Diverticulosis   . Hypertension   . Hypothyroidism   . Jaundice   . Mitral valve prolapse   . Thyroid disease     Past Surgical History:  Procedure Laterality Date  . BREAST BIOPSY    . ENDOSCOPIC RETROGRADE CHOLANGIOPANCREATOGRAPHY (ERCP) WITH PROPOFOL N/A 08/08/2015   Procedure: ENDOSCOPIC RETROGRADE CHOLANGIOPANCREATOGRAPHY (ERCP) WITH PROPOFOL;  Surgeon: [name], MD;  Location: [facility];  Service: Endoscopy;  Laterality: N/A;  . 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 EUS N/A 08/08/2015   Procedure: UPPER ENDOSCOPIC ULTRASOUND (EUS) RADIAL;  Surgeon: [name], MD;  Location: [facility];  Service: Endoscopy;  Laterality: N/A;  . INGUINAL HERNIA REPAIR     right  . SPYGLASS CHOLANGIOSCOPY N/A 12/26/2015   Procedure: [id number] CHOLANGIOSCOPY;  Surgeon: [name], MD;  Location: [facility];  Service: Endoscopy;  Laterality: N/A;  . TONSILLECTOMY      Current Outpatient Prescriptions  Medication Sig Dispense Refill  . amLODipine (NORVASC) 10 MG tablet Take 10 mg by mouth at bedtime.     Marland Kitchen  HYDROcodone-acetaminophen (NORCO/VICODIN) 5-325 MG tablet Take 2 tablets by mouth every 4 (four) hours as needed. (Patient taking differently: Take 2 tablets by mouth every 4 (four) hours as needed (For  pain.). ) 10 tablet 0  . hyoscyamine (LEVSIN) 0.125 MG tablet Take 1 to 2 tablets three times a day if needed for abdominal spasms 30 tablet 1  . levothyroxine (SYNTHROID, LEVOTHROID) 25 MCG tablet Take 25 mcg by mouth daily before breakfast.    . lisinopril (PRINIVIL,ZESTRIL) 5 MG tablet Take 5 mg by mouth at bedtime.     . Multiple Vitamins-Minerals (MULTIVITAMIN GUMMIES ADULT) CHEW Chew 1 tablet by mouth daily.    . polyethylene glycol powder (GLYCOLAX/MIRALAX) powder Take 17 g by mouth as needed for mild constipation.     . Tetrahydroz-Polyvinyl Al-Povid (MURINE TEARS PLUS OP) Place 1 drop into both eyes daily as needed (For dry eyes.).     No current facility-administered medications for this visit.     Allergies as of 09/15/2016 - Review Complete 09/15/2016  Allergen Reaction Noted  . Sulfamethoxazole Nausea Only 08/13/2015    Family History  Problem Relation Age of Onset  . Breast cancer Sister   . Heart disease Father     Social History   Social History  . Marital status: Married    Spouse name: N/A  . Number of children: 2  . Years of education: N/A   Occupational History  . Retired    Social History Main Topics  . Smoking status: Never Smoker  . Smokeless tobacco: Never Used  . Alcohol use No     Comment: none since Thanksgiving  . Drug use: No  . Sexual activity: Not on file   Other Topics Concern  . Not on file   Social History Narrative  . No narrative on file     Physical Exam: BP 120/60   Pulse 78   Ht 4\' 11"  (1.499 m)   Wt 95 lb 4 oz (43.2 kg)   BMI 19.24 kg/m  Constitutional: generally well-appearing Psychiatric: alert and oriented x3 Abdomen: soft, nontender, nondistended, no obvious ascites, no peritoneal signs, normal bowel sounds No peripheral edema noted in lower extremities  Assessment and plan: 83 y.o. female with Bile duct stricture of unclear etiology  We again discussed potential etiologies of her bile duct stricture. Her  initial presentation was over a year ago I think is highly unlikely that she had pancreatic cancer at that point. Perhaps she has bile duct neoplasm. Either way we have never been able to prove neoplasm here. She is doing well with chronic indwelling bile duct stent. She understands that stent may become blocked. She will keep attention for jaundice, fevers without explanation, significant worsening of her abdominal pains, dark urine. Going to get a repeat set of labs today including a CBC and complete metabolic profile in the imaged one more time in for 5 months with a CT scan of her abdomen to look again at her pancreas. If no significant changes on that scan then I think we should forego any further surveillance or serial imaging and just simply follow her clinically. I'm not sure what her spasms of pain in her lower abdomen are. Perhaps IBS. Antispasmodics seem to help only somewhat. Possibly these are related to the stent in her bile duct but she says she is able to live with these pains in antispasm medicines seem to help so we'll just continue that for now.  Please see the "Patient Instructions" section for  addition details about the plan.  Greater than 50% of this visit was spent in direct face-to-face counseling.  Total time of this visit was 20 min.   Owens Loffler, MD [facility] 09/15/2016, 3:24 PM

## 2016-10-08 ENCOUNTER — Other Ambulatory Visit: Payer: Self-pay | Admitting: Obstetrics & Gynecology

## 2016-10-08 DIAGNOSIS — R928 Other abnormal and inconclusive findings on diagnostic imaging of breast: Secondary | ICD-10-CM

## 2016-10-13 ENCOUNTER — Ambulatory Visit
Admission: RE | Admit: 2016-10-13 | Discharge: 2016-10-13 | Disposition: A | Discharge status: Home / Self Care | Payer: Medicare Other | Source: Ambulatory Visit | Attending: Obstetrics & Gynecology | Admitting: Obstetrics & Gynecology

## 2016-10-13 DIAGNOSIS — R928 Other abnormal and inconclusive findings on diagnostic imaging of breast: Secondary | ICD-10-CM

## 2016-11-23 ENCOUNTER — Telehealth: Payer: Self-pay | Admitting: Gastroenterology

## 2016-11-23 ENCOUNTER — Inpatient Hospital Stay (HOSPITAL_COMMUNITY)
Admission: EM | Admit: 2016-11-23 | Discharge: 2016-12-01 | DRG: 326 | Disposition: A | Discharge status: Home / Self Care | Payer: Medicare Other | Attending: Family Medicine | Admitting: Family Medicine

## 2016-11-23 ENCOUNTER — Encounter (HOSPITAL_COMMUNITY): Payer: Self-pay | Admitting: Emergency Medicine

## 2016-11-23 ENCOUNTER — Telehealth: Payer: Self-pay | Admitting: Internal Medicine

## 2016-11-23 DIAGNOSIS — K5900 Constipation, unspecified: Secondary | ICD-10-CM | POA: Diagnosis present

## 2016-11-23 DIAGNOSIS — E876 Hypokalemia: Secondary | ICD-10-CM | POA: Diagnosis present

## 2016-11-23 DIAGNOSIS — E860 Dehydration: Secondary | ICD-10-CM | POA: Diagnosis not present

## 2016-11-23 DIAGNOSIS — R109 Unspecified abdominal pain: Secondary | ICD-10-CM

## 2016-11-23 DIAGNOSIS — K311 Adult hypertrophic pyloric stenosis: Secondary | ICD-10-CM | POA: Diagnosis not present

## 2016-11-23 DIAGNOSIS — K3184 Gastroparesis: Secondary | ICD-10-CM | POA: Diagnosis present

## 2016-11-23 DIAGNOSIS — E039 Hypothyroidism, unspecified: Secondary | ICD-10-CM | POA: Diagnosis present

## 2016-11-23 DIAGNOSIS — Z9689 Presence of other specified functional implants: Secondary | ICD-10-CM | POA: Diagnosis present

## 2016-11-23 DIAGNOSIS — K315 Obstruction of duodenum: Secondary | ICD-10-CM | POA: Diagnosis present

## 2016-11-23 DIAGNOSIS — I10 Essential (primary) hypertension: Secondary | ICD-10-CM | POA: Diagnosis present

## 2016-11-23 DIAGNOSIS — I341 Nonrheumatic mitral (valve) prolapse: Secondary | ICD-10-CM | POA: Diagnosis present

## 2016-11-23 DIAGNOSIS — Z883 Allergy status to other anti-infective agents status: Secondary | ICD-10-CM

## 2016-11-23 DIAGNOSIS — R748 Abnormal levels of other serum enzymes: Secondary | ICD-10-CM

## 2016-11-23 DIAGNOSIS — Z79899 Other long term (current) drug therapy: Secondary | ICD-10-CM

## 2016-11-23 DIAGNOSIS — K449 Diaphragmatic hernia without obstruction or gangrene: Secondary | ICD-10-CM | POA: Diagnosis present

## 2016-11-23 DIAGNOSIS — K831 Obstruction of bile duct: Secondary | ICD-10-CM | POA: Diagnosis present

## 2016-11-23 DIAGNOSIS — K760 Fatty (change of) liver, not elsewhere classified: Secondary | ICD-10-CM | POA: Diagnosis present

## 2016-11-23 DIAGNOSIS — N390 Urinary tract infection, site not specified: Secondary | ICD-10-CM

## 2016-11-23 DIAGNOSIS — K869 Disease of pancreas, unspecified: Secondary | ICD-10-CM | POA: Diagnosis present

## 2016-11-23 DIAGNOSIS — N200 Calculus of kidney: Secondary | ICD-10-CM | POA: Diagnosis present

## 2016-11-23 DIAGNOSIS — Z8249 Family history of ischemic heart disease and other diseases of the circulatory system: Secondary | ICD-10-CM

## 2016-11-23 HISTORY — DX: Essential (primary) hypertension: I10

## 2016-11-23 LAB — URINALYSIS, ROUTINE W REFLEX MICROSCOPIC
Bilirubin Urine: NEGATIVE
GLUCOSE, UA: NEGATIVE mg/dL
Hgb urine dipstick: NEGATIVE
Ketones, ur: 80 mg/dL — AB
Nitrite: NEGATIVE
PH: 5 (ref 5.0–8.0)
Protein, ur: 30 mg/dL — AB
SPECIFIC GRAVITY, URINE: 1.02 (ref 1.005–1.030)

## 2016-11-23 LAB — COMPREHENSIVE METABOLIC PANEL
ALK PHOS: 121 U/L (ref 38–126)
ALT: 33 U/L (ref 14–54)
AST: 35 U/L (ref 15–41)
Albumin: 4.6 g/dL (ref 3.5–5.0)
Anion gap: 11 (ref 5–15)
BUN: 13 mg/dL (ref 6–20)
CALCIUM: 10.2 mg/dL (ref 8.9–10.3)
CO2: 28 mmol/L (ref 22–32)
CREATININE: 0.75 mg/dL (ref 0.44–1.00)
Chloride: 94 mmol/L — ABNORMAL LOW (ref 101–111)
GFR calc non Af Amer: >60 mL/min (ref >60)
Glucose, Bld: 128 mg/dL — ABNORMAL HIGH (ref 65–99)
Potassium: 3.9 mmol/L (ref 3.5–5.1)
SODIUM: 133 mmol/L — AB (ref 135–145)
Total Bilirubin: 0.9 mg/dL (ref 0.3–1.2)
Total Protein: 7.7 g/dL (ref 6.5–8.1)

## 2016-11-23 LAB — CBC
HCT: 38.4 % (ref 36.0–46.0)
Hemoglobin: 13.1 g/dL (ref 12.0–15.0)
MCH: 31 pg (ref 26.0–34.0)
MCHC: 34.1 g/dL (ref 30.0–36.0)
MCV: 90.8 fL (ref 78.0–100.0)
PLATELETS: 297 10*3/uL (ref 150–400)
RBC: 4.23 MIL/uL (ref 3.87–5.11)
RDW: 13.5 % (ref 11.5–15.5)
WBC: 5.3 10*3/uL (ref 4.0–10.5)

## 2016-11-23 LAB — LIPASE, BLOOD: Lipase: 496 U/L — ABNORMAL HIGH (ref 11–51)

## 2016-11-23 MED ORDER — ONDANSETRON HCL 4 MG/2ML IJ SOLN
4.0000 mg | Freq: Once | INTRAMUSCULAR | Status: AC
  Administered 2016-11-24: 4 mg via INTRAVENOUS
  Filled 2016-11-23: qty 2

## 2016-11-23 MED ORDER — FENTANYL CITRATE (PF) 100 MCG/2ML IJ SOLN: 50.0000 ug | Freq: Once | INTRAMUSCULAR | Status: DC

## 2016-11-23 MED ORDER — SODIUM CHLORIDE 0.9 % IV BOLUS (SEPSIS)
1000.0000 mL | Freq: Once | INTRAVENOUS | Status: AC
  Administered 2016-11-24: 1000 mL via INTRAVENOUS

## 2016-11-23 MED ORDER — FENTANYL CITRATE (PF) 100 MCG/2ML IJ SOLN
25.0000 ug | Freq: Once | INTRAMUSCULAR | Status: AC
  Administered 2016-11-24: 25 ug via INTRAVENOUS
  Filled 2016-11-23: qty 2

## 2016-11-23 NOTE — Telephone Encounter (Signed)
Patient chart reviewed. Hx bile duct stricture. Saw DJ 09-15-16 with cramping abdominal pain. Labs were ok. Treated with levsin. Called office today with worsening pain. Tonight with severe abdominal pain and recurrent vomiting. Told to go to ER for evaluation. She agreed. Message forwarded to Dr. [name]

## 2016-11-23 NOTE — Telephone Encounter (Signed)
Pt has continued generalized abd pain, pt states the pain is getting worse especially after eating.  She is due for CT abd in June.  Takes levsin only as needed, was advised to take levsin 1-2 tabs three times daily, she also states she has been constipated and has only been using miralax as needed.  She was advised to do 2 caps of miralax daily until she has a good bowel movement.  I will forward to Dr [name] for further reqs.

## 2016-11-23 NOTE — ED Provider Notes (Signed)
TIME SEEN: 11:26 PM  11/23/2016  By signing my name below, I, Oleh Genin, attest that this documentation has been prepared under the direction and in the presence of [place], DO. Electronically Signed: Oleh Genin, Scribe. 11/23/16. 11:32 PM.  CHIEF COMPLAINT: Abdominal pain  HPI: This is an 83 year old female with history of HTN, hypothyroidism, and diverticulosis who presents to the ED for evaluation of abdominal pain. This patient states that she has ongoing problems with intermittent bilateral lower abdominal pain with occasional bloating. She states that in the last 2 weeks these symptoms have grown more severe and more frequent. She also has had vomiting in the last 24 hours following meals. Did have 2 days of diarrhea earlier this week however. However no diarrhea or constipation in the last 48 hours. No fever or chills. No dysuria or hematuria. No vaginal bleeding or discharge. She does state that she was admitted in December 2016 and underwent ERCP with biliary stent placement; no pain at that time. It appears at that time they thought that she had a chronic mass. Biopsies revealed only atypical cells. She had another intervention in January 2017 by interventional radiology and at that time I thought she had a ductal stricture. She denies any particular modifying factors today. Pain is non-radiating. She is followed by [facility] Dr. [name]; no recent colonoscopies or endoscopies. Denies any other history of abdominal surgery.   PCP - [facility]  ROS: See HPI Constitutional: no fever  Eyes: no drainage  ENT: no runny nose   Cardiovascular:  no chest pain  Resp: no SOB  GI: no diarrhea, or constipation. + abdominal pain, distention, vomiting GU: no dysuria, hematuria Integumentary: no rash  Allergy: no hives  Musculoskeletal: no leg swelling  Neurological: no slurred speech ROS otherwise negative  PAST MEDICAL HISTORY/PAST SURGICAL HISTORY:  Past Medical History:   Diagnosis Date  . Arthritis   . Common bile duct (CBD) obstruction   . Difficult intubation Anterior glottis, short TMD, limited mouth opening. Required glidescope and bougie stylet with multiple attempts.  . Diverticular disease   . Diverticulosis   . Hypertension   . Hypothyroidism   . Jaundice   . Mitral valve prolapse   . Thyroid disease     MEDICATIONS:  Prior to Admission medications   Medication Sig Start Date End Date Taking? Authorizing Provider  amLODipine (NORVASC) 10 MG tablet Take 10 mg by mouth at bedtime.  06/16/15  Yes Historical Provider, MD  hyoscyamine (LEVSIN) 0.125 MG tablet Take 1 to 2 tablets three times a day if needed for abdominal spasms 05/22/16  Yes [name], MD  levothyroxine (SYNTHROID, LEVOTHROID) 25 MCG tablet Take 25 mcg by mouth daily before breakfast.   Yes Historical Provider, MD  lisinopril (PRINIVIL,ZESTRIL) 5 MG tablet Take 5 mg by mouth at bedtime.    Yes Historical Provider, MD  Multiple Vitamins-Minerals (MULTIVITAMIN GUMMIES ADULT) CHEW Chew 1 tablet by mouth daily.   Yes Historical Provider, MD  polyethylene glycol powder (GLYCOLAX/MIRALAX) powder Take 17 g by mouth as needed for mild constipation.    Yes Historical Provider, MD  polyvinyl alcohol (LIQUIFILM TEARS) 1.4 % ophthalmic solution Place 1 drop into both eyes daily as needed for dry eyes.   Yes Historical Provider, MD  HYDROcodone-acetaminophen (NORCO/VICODIN) 5-325 MG tablet Take 2 tablets by mouth every 4 (four) hours as needed. Patient not taking: Reported on 11/23/2016 09/06/15   Noemi Chapel, MD    ALLERGIES:  Allergies  Allergen Reactions  . Sulfamethoxazole Nausea Only    SOCIAL HISTORY:  Social History  Substance Use Topics  . Smoking status: Never Smoker  . Smokeless tobacco: Never Used  . Alcohol use No     Comment: none since Thanksgiving    FAMILY HISTORY: Family History  Problem Relation Age of Onset  . Breast cancer Sister   . Heart disease Father      EXAM: BP (!) 155/85 (BP Location: Left Arm)   Pulse 82   Temp 98.1 F (36.7 C) (Oral)   Resp 17   Ht 4\' 11"  (1.499 m)   Wt 98 lb (44.5 kg)   SpO2 98%   BMI 19.79 kg/m  CONSTITUTIONAL: Alert and oriented and responds appropriately to questions. Elderly appearing.Afebrile and nontoxic. HEAD: Normocephalic EYES: Conjunctivae clear, pupils appear equal, EOMI ENT: normal nose; dry mucous membranes NECK: Supple, no meningismus, no nuchal rigidity, no LAD  CARD: RRR; S1 and S2 appreciated; no murmurs, no clicks, no rubs, no gallops RESP: Normal chest excursion without splinting or tachypnea; breath sounds clear and equal bilaterally; no wheezes, no rhonchi, no rales, no hypoxia or respiratory distress, speaking full sentences ABD/GI: Normal bowel sounds; non-distended; soft, diffusely tender to the mid and lower abdomen, no rebound, no guarding, no peritoneal signs, no hepatosplenomegaly BACK:  The back appears normal and is non-tender to palpation, there is no CVA tenderness EXT: Normal ROM in all joints; non-tender to palpation; no edema; normal capillary refill; no cyanosis, no calf tenderness or swelling    SKIN: Normal color for age and race; warm; no rash NEURO: Moves all extremities equally PSYCH: The patient's mood and manner are appropriate. Grooming and personal hygiene are appropriate.  MEDICAL DECISION MAKING: Patient here with diffuse abdominal pain. Here she has an elevated lipase but normal LFTs. Urine shows moderate leukocytes but rare bacteria. Large ketones. She does appear dry on exam. Will give IV fluids, Zofran, fentanyl. I feel she will need a CT scan for further evaluation. I have also sent a urine culture. Differential diagnosis includes appendicitis, colitis, diverticulitis, pancreatitis, biliary stricture, choledocholithiasis.  ED PROGRESS: Patient's CT scan shows pneumobilia status post biliary stent with minimal debris noted in the distal aspect of the stent. No  mass visualized. She has signs of gastroparesis on CT scan which is the only new finding. Patient still having pain, intermittent nausea but no vomiting. I feel she needs admission for possible MRCP for further evaluation of why her lipase is elevated. Given large ketones in her urinary feel she needs continuous hydration. Have recommended admission and she agrees with this plan.  4:58 AM Discussed patient's case with hospitatlist, Dr. Blaine Hamper.  I have recommended admission and patient (and family if present) agree with this plan. Admitting physician will place admission orders. He will order MRCP. He reports he will consult GI in the morning.  I reviewed all nursing notes, vitals, pertinent previous records, EKGs, lab and urine results, imaging (as available).    I personally performed the services described in this documentation, which was scribed in my presence. The recorded information has been reviewed and is accurate.    [place], DO 11/24/16 [phone]

## 2016-11-23 NOTE — ED Triage Notes (Signed)
Pt states she has had chronic pelvic pain for over a year and it comes and goes  Pt states the pain got really bad yesterday and today she ate a cracker and threw it up  Pt states since that she has been throwing up even sips of water  Pt states the pain increases when she eats

## 2016-11-24 ENCOUNTER — Encounter (HOSPITAL_COMMUNITY): Payer: Self-pay

## 2016-11-24 ENCOUNTER — Emergency Department (HOSPITAL_COMMUNITY): Payer: Medicare Other

## 2016-11-24 DIAGNOSIS — R1030 Lower abdominal pain, unspecified: Secondary | ICD-10-CM

## 2016-11-24 DIAGNOSIS — K760 Fatty (change of) liver, not elsewhere classified: Secondary | ICD-10-CM | POA: Diagnosis present

## 2016-11-24 DIAGNOSIS — I10 Essential (primary) hypertension: Secondary | ICD-10-CM | POA: Diagnosis present

## 2016-11-24 DIAGNOSIS — K3184 Gastroparesis: Secondary | ICD-10-CM | POA: Diagnosis present

## 2016-11-24 DIAGNOSIS — Z883 Allergy status to other anti-infective agents status: Secondary | ICD-10-CM | POA: Diagnosis not present

## 2016-11-24 DIAGNOSIS — Z79899 Other long term (current) drug therapy: Secondary | ICD-10-CM | POA: Diagnosis not present

## 2016-11-24 DIAGNOSIS — I341 Nonrheumatic mitral (valve) prolapse: Secondary | ICD-10-CM | POA: Diagnosis present

## 2016-11-24 DIAGNOSIS — R109 Unspecified abdominal pain: Secondary | ICD-10-CM | POA: Diagnosis not present

## 2016-11-24 DIAGNOSIS — Z9689 Presence of other specified functional implants: Secondary | ICD-10-CM | POA: Diagnosis present

## 2016-11-24 DIAGNOSIS — Z8249 Family history of ischemic heart disease and other diseases of the circulatory system: Secondary | ICD-10-CM | POA: Diagnosis not present

## 2016-11-24 DIAGNOSIS — E876 Hypokalemia: Secondary | ICD-10-CM | POA: Diagnosis present

## 2016-11-24 DIAGNOSIS — E860 Dehydration: Secondary | ICD-10-CM | POA: Diagnosis present

## 2016-11-24 DIAGNOSIS — K869 Disease of pancreas, unspecified: Secondary | ICD-10-CM | POA: Diagnosis present

## 2016-11-24 DIAGNOSIS — N200 Calculus of kidney: Secondary | ICD-10-CM | POA: Diagnosis present

## 2016-11-24 DIAGNOSIS — N390 Urinary tract infection, site not specified: Secondary | ICD-10-CM

## 2016-11-24 DIAGNOSIS — K315 Obstruction of duodenum: Secondary | ICD-10-CM | POA: Diagnosis present

## 2016-11-24 DIAGNOSIS — K831 Obstruction of bile duct: Secondary | ICD-10-CM | POA: Diagnosis present

## 2016-11-24 DIAGNOSIS — K449 Diaphragmatic hernia without obstruction or gangrene: Secondary | ICD-10-CM | POA: Diagnosis present

## 2016-11-24 DIAGNOSIS — K5900 Constipation, unspecified: Secondary | ICD-10-CM | POA: Diagnosis present

## 2016-11-24 DIAGNOSIS — E039 Hypothyroidism, unspecified: Secondary | ICD-10-CM | POA: Diagnosis present

## 2016-11-24 DIAGNOSIS — K222 Esophageal obstruction: Secondary | ICD-10-CM | POA: Diagnosis not present

## 2016-11-24 DIAGNOSIS — K311 Adult hypertrophic pyloric stenosis: Secondary | ICD-10-CM | POA: Diagnosis present

## 2016-11-24 DIAGNOSIS — E038 Other specified hypothyroidism: Secondary | ICD-10-CM | POA: Diagnosis not present

## 2016-11-24 DIAGNOSIS — R1084 Generalized abdominal pain: Secondary | ICD-10-CM | POA: Diagnosis not present

## 2016-11-24 LAB — GLUCOSE, CAPILLARY: Glucose-Capillary: 118 mg/dL — ABNORMAL HIGH (ref 65–99)

## 2016-11-24 MED ORDER — POLYETHYLENE GLYCOL 3350 17 G PO PACK: 17.0000 g | PACK | ORAL | Status: DC | PRN

## 2016-11-24 MED ORDER — DEXTROSE 5 % IV SOLN
1.0000 g | INTRAVENOUS | Status: DC
  Administered 2016-11-25 – 2016-11-26 (×2): 1 g via INTRAVENOUS
  Filled 2016-11-24 (×3): qty 10

## 2016-11-24 MED ORDER — SODIUM CHLORIDE 0.9 % IV SOLN: INTRAVENOUS | Status: DC

## 2016-11-24 MED ORDER — POLYVINYL ALCOHOL 1.4 % OP SOLN
1.0000 [drp] | Freq: Every day | OPHTHALMIC | Status: DC | PRN
  Filled 2016-11-24: qty 15

## 2016-11-24 MED ORDER — IOPAMIDOL (ISOVUE-300) INJECTION 61%
INTRAVENOUS | Status: AC
  Administered 2016-11-24: 30 mL via ORAL
  Filled 2016-11-24: qty 30

## 2016-11-24 MED ORDER — LEVOTHYROXINE SODIUM 25 MCG PO TABS
25.0000 ug | ORAL_TABLET | Freq: Every day | ORAL | Status: DC
  Administered 2016-11-25 – 2016-11-26 (×2): 25 ug via ORAL
  Filled 2016-11-24 (×6): qty 1

## 2016-11-24 MED ORDER — ONDANSETRON HCL 4 MG/2ML IJ SOLN
4.0000 mg | Freq: Three times a day (TID) | INTRAMUSCULAR | Status: DC | PRN
  Administered 2016-11-27 – 2016-11-29 (×3): 4 mg via INTRAVENOUS
  Filled 2016-11-24 (×4): qty 2

## 2016-11-24 MED ORDER — MORPHINE SULFATE (PF) 2 MG/ML IV SOLN: 1.0000 mg | INTRAVENOUS | Status: DC | PRN

## 2016-11-24 MED ORDER — AMLODIPINE BESYLATE 10 MG PO TABS
10.0000 mg | ORAL_TABLET | Freq: Every day | ORAL | Status: DC
  Administered 2016-11-24 – 2016-11-30 (×7): 10 mg via ORAL
  Filled 2016-11-24 (×4): qty 1
  Filled 2016-11-24: qty 2
  Filled 2016-11-24 (×2): qty 1

## 2016-11-24 MED ORDER — METOCLOPRAMIDE HCL 5 MG/ML IJ SOLN
5.0000 mg | Freq: Two times a day (BID) | INTRAMUSCULAR | Status: DC
  Administered 2016-11-24 (×2): 5 mg via INTRAVENOUS
  Administered 2016-11-25: 10:00:00 via INTRAVENOUS
  Administered 2016-11-25 – 2016-11-30 (×9): 5 mg via INTRAVENOUS
  Filled 2016-11-24 (×12): qty 2

## 2016-11-24 MED ORDER — ADULT MULTIVITAMIN W/MINERALS CH
1.0000 | ORAL_TABLET | Freq: Every day | ORAL | Status: DC
  Filled 2016-11-24 (×5): qty 1

## 2016-11-24 MED ORDER — PANTOPRAZOLE SODIUM 40 MG IV SOLR
40.0000 mg | Freq: Every day | INTRAVENOUS | Status: DC
  Administered 2016-11-25 – 2016-11-30 (×5): 40 mg via INTRAVENOUS
  Filled 2016-11-24 (×8): qty 40

## 2016-11-24 MED ORDER — POLYETHYLENE GLYCOL 3350 17 G PO PACK
17.0000 g | PACK | Freq: Two times a day (BID) | ORAL | Status: DC
  Administered 2016-11-24 – 2016-11-27 (×4): 17 g via ORAL
  Filled 2016-11-24 (×8): qty 1

## 2016-11-24 MED ORDER — LISINOPRIL 5 MG PO TABS
5.0000 mg | ORAL_TABLET | Freq: Every day | ORAL | Status: DC
  Administered 2016-11-24 – 2016-11-30 (×7): 5 mg via ORAL
  Filled 2016-11-24 (×8): qty 1

## 2016-11-24 MED ORDER — ZOLPIDEM TARTRATE 5 MG PO TABS
5.0000 mg | ORAL_TABLET | Freq: Every evening | ORAL | Status: DC | PRN
  Filled 2016-11-24: qty 1

## 2016-11-24 MED ORDER — SODIUM CHLORIDE 0.9 % IV SOLN
INTRAVENOUS | Status: DC
  Administered 2016-11-24 – 2016-11-25 (×4): via INTRAVENOUS
  Administered 2016-11-26: 1000 mL via INTRAVENOUS
  Administered 2016-11-26: 11:00:00 via INTRAVENOUS

## 2016-11-24 MED ORDER — PANTOPRAZOLE SODIUM 40 MG IV SOLR
40.0000 mg | Freq: Once | INTRAVENOUS | Status: AC
  Administered 2016-11-24: 40 mg via INTRAVENOUS
  Filled 2016-11-24: qty 40

## 2016-11-24 MED ORDER — HYOSCYAMINE SULFATE 0.125 MG PO TABS
0.1250 mg | ORAL_TABLET | Freq: Three times a day (TID) | ORAL | Status: DC | PRN
  Filled 2016-11-24: qty 1

## 2016-11-24 MED ORDER — HYDRALAZINE HCL 20 MG/ML IJ SOLN: 5.0000 mg | INTRAMUSCULAR | Status: DC | PRN

## 2016-11-24 MED ORDER — IOPAMIDOL (ISOVUE-300) INJECTION 61%
100.0000 mL | Freq: Once | INTRAVENOUS | Status: AC | PRN
  Administered 2016-11-24: 80 mL via INTRAVENOUS

## 2016-11-24 MED ORDER — IOPAMIDOL (ISOVUE-300) INJECTION 61%
INTRAVENOUS | Status: AC
  Administered 2016-11-24: 80 mL via INTRAVENOUS
  Filled 2016-11-24: qty 100

## 2016-11-24 MED ORDER — IOPAMIDOL (ISOVUE-300) INJECTION 61%
30.0000 mL | Freq: Once | INTRAVENOUS | Status: AC | PRN
  Administered 2016-11-24: 30 mL via ORAL

## 2016-11-24 NOTE — Progress Notes (Signed)
Patient admitted after midnight. For details please refer to admission note done 09/26/2016.  83 year old female with medical history significant for CBD obstruction status post stent, hypertension, hypothyroidism who presented to [facility] with abdominal pain, nausea and vomiting. Over the past 2 weeks her abdominal pain became worse and more frequent located in the mid abdomen and left and right side. Pain is about 7 out of 10 in intensity, constant. Patient reported vomiting each time she eats or drinks. No fevers, no diarrhea. Pt has been seen by GI, Dr. [name]. Per Dr. Audelia Acton note on 09/15/16, pt had vague, masslike area in the head of the pancreas. Cytology and pathology with forcep biopsies through interventional radiology on 3 separate occasions have never shown clear malignancy, but a few 'atypical cells' were noted.  On admission, patient was hemodynamically stable. Lipase was 496, normal LFTs. There were moderate amount of leukocytes on urinalysis.CT abdomen and pelvis showed pneumobilia status post biliary stent with minimal debris in the distal aspect of the stent but no space occupying mass of the liver, right upper pole renal calculus without obstruction. Appreciate the GI consult and recommendation.  Leisa Lenz [facility] [phone]

## 2016-11-24 NOTE — Consult Note (Signed)
Referring Provider: [facility]   Primary Care Physician:  [name], MD Primary Gastroenterologist: Oretha Caprice,  MD  Reason for Consultation:  Abdominal pain  ASSESSMENT AND PLAN:   64.  83 year old female with acute on chronic lower abdominal pain. Antispasmodics have helped in past. Yesterday pain escalated, she had associated vomiting and imaging shows markedly distended stomach without small or large bowel obstruction.  However, Dr. [name] reviewed imaging and feels there is some dilation of duodenum down to area of biliary stent.  Lipase in 400s but no radiographic evidence for pancreatitis.  Her pain is lower than one would expect for pancreatitis and this is the same pain she has had for months (just worse).  -She is feeling better today. Abdomen is not distended and + bowel sounds. She wants something to drink. Not sure why these acute symptoms. Try clears today, if recurrent vomiting then there probably is a mechanical obstruction and will workup accordingly.    2. Biliary stricture. Has not been proven by biopsies or brushings to be malignant . She is decompressed with metal stent. LFTs are normal and only minimal debris in distal aspect of stent on imaging. No need for an MRCP at this point.   3. Constipation.  -miralax BID   HPI: Summer Jones is a 83 y.o. female known to Dr. [name] for hx of a bile duct stricture. Despite brushings and biopsies of the stricture no malignancy has been diagnosed. She has a covered, removal metal stent in place, she has not been interested in surgery. Surveillance CTscan in October showed stent in places, no pancreatic masses. She has suffered with spasm type lower abdominal pain for months.No urinary sx. Antispasmodics helped in the past. Patient call our office yesterday with complaints of persistent abdominal pain and constipation. We increased antispasmodic and miralax. Later advised to come to ED as pain worsened and she developed  vomiting. Couldn't keep anything down yesterday. CTscan shows miminal debris in distal aspect of stent, no duct dilation or masses. There is marked gastric distention without evidence for small or large bowel obstruction. WBC normal. Lipase in 496, LFTs are normal.    Past Medical History:  Diagnosis Date  . Arthritis   . Common bile duct (CBD) obstruction   . Difficult intubation Anterior glottis, short TMD, limited mouth opening. Required glidescope and bougie stylet with multiple attempts.  . Diverticular disease   . Diverticulosis   . Essential hypertension   . Hypertension   . Hypothyroidism   . Jaundice   . Mitral valve prolapse   . Thyroid disease     Past Surgical History:  Procedure Laterality Date  . BREAST BIOPSY    . BREAST CYST EXCISION    . ENDOSCOPIC RETROGRADE CHOLANGIOPANCREATOGRAPHY (ERCP) WITH PROPOFOL N/A 08/08/2015   Procedure: ENDOSCOPIC RETROGRADE CHOLANGIOPANCREATOGRAPHY (ERCP) WITH PROPOFOL;  Surgeon: [name], MD;  Location: [facility];  Service: Endoscopy;  Laterality: N/A;  . 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 EUS N/A 08/08/2015   Procedure: UPPER ENDOSCOPIC ULTRASOUND (EUS) RADIAL;  Surgeon: [name], MD;  Location: [facility];  Service: Endoscopy;  Laterality: N/A;  . INGUINAL HERNIA REPAIR     right  . SPYGLASS CHOLANGIOSCOPY N/A 12/26/2015   Procedure: SJGGEZMO CHOLANGIOSCOPY;  Surgeon: Quillian Quince  Merrily Brittle, MD;  Location: [facility];  Service: Endoscopy;  Laterality: N/A;  . TONSILLECTOMY      Prior to Admission medications   Medication Sig Start Date End Date Taking? Authorizing Provider  amLODipine (NORVASC) 10 MG tablet Take 10 mg by mouth at bedtime.  06/16/15  Yes  Historical Provider, MD  hyoscyamine (LEVSIN) 0.125 MG tablet Take 1 to 2 tablets three times a day if needed for abdominal spasms 05/22/16  Yes [name], MD  levothyroxine (SYNTHROID, LEVOTHROID) 25 MCG tablet Take 25 mcg by mouth daily before breakfast.   Yes Historical Provider, MD  lisinopril (PRINIVIL,ZESTRIL) 5 MG tablet Take 5 mg by mouth at bedtime.    Yes Historical Provider, MD  Multiple Vitamins-Minerals (MULTIVITAMIN GUMMIES ADULT) CHEW Chew 1 tablet by mouth daily.   Yes Historical Provider, MD  polyethylene glycol powder (GLYCOLAX/MIRALAX) powder Take 17 g by mouth as needed for mild constipation.    Yes Historical Provider, MD  polyvinyl alcohol (LIQUIFILM TEARS) 1.4 % ophthalmic solution Place 1 drop into both eyes daily as needed for dry eyes.   Yes Historical Provider, MD  HYDROcodone-acetaminophen (NORCO/VICODIN) 5-325 MG tablet Take 2 tablets by mouth every 4 (four) hours as needed. Patient not taking: Reported on 11/23/2016 09/06/15   Noemi Chapel, MD    Current Facility-Administered Medications  Medication Dose Route Frequency Provider Last Rate Last Dose  . 0.9 %  sodium chloride infusion   Intravenous Continuous Kristen N Ward, DO 125 mL/hr at 11/24/16 0453    . amLODipine (NORVASC) tablet 10 mg  10 mg Oral QHS Ivor Costa, MD      . cefTRIAXone (ROCEPHIN) 1 g in dextrose 5 % 50 mL IVPB  1 g Intravenous Q24H Ivor Costa, MD      . hydrALAZINE (APRESOLINE) injection 5 mg  5 mg Intravenous Q2H PRN Ivor Costa, MD      . hyoscyamine (LEVSIN, ANASPAZ) tablet 0.125 mg  0.125 mg Oral TID PRN Ivor Costa, MD      . levothyroxine (SYNTHROID, LEVOTHROID) tablet 25 mcg  25 mcg Oral QAC breakfast Ivor Costa, MD      . lisinopril (PRINIVIL,ZESTRIL) tablet 5 mg  5 mg Oral QHS Ivor Costa, MD      . metoCLOPramide (REGLAN) injection 5 mg  5 mg Intravenous BID Ivor Costa, MD   5 mg at 11/24/16 0900  . morphine 2 MG/ML injection 1 mg  1 mg Intravenous Q4H PRN Ivor Costa, MD      . multivitamin  with minerals tablet 1 tablet  1 tablet Oral Daily Ivor Costa, MD      . ondansetron [facility]) injection 4 mg  4 mg Intravenous Q8H PRN Ivor Costa, MD      . pantoprazole (PROTONIX) injection 40 mg  40 mg Intravenous Daily Ivor Costa, MD      . polyethylene glycol (MIRALAX / GLYCOLAX) packet 17 g  17 g Oral PRN Ivor Costa, MD      . polyvinyl alcohol (LIQUIFILM TEARS) 1.4 % ophthalmic solution 1 drop  1 drop Both Eyes Daily PRN Ivor Costa, MD      . zolpidem (AMBIEN) tablet 5 mg  5 mg Oral QHS PRN Ivor Costa, MD        Allergies as of 11/23/2016 - Review Complete 11/23/2016  Allergen Reaction Noted  . Sulfamethoxazole Nausea Only 08/13/2015    Family History  Problem Relation Age of Onset  . Breast cancer Sister   . Heart disease  Father     Social History   Social History  . Marital status: Married    Spouse name: N/A  . Number of children: 2  . Years of education: N/A   Occupational History  . Retired    Social History Main Topics  . Smoking status: Never Smoker  . Smokeless tobacco: Never Used  . Alcohol use No     Comment: none since Thanksgiving  . Drug use: No  . Sexual activity: Not on file   Other Topics Concern  . Not on file   Social History Narrative  . No narrative on file    Review of Systems: All systems reviewed and negative except where noted in HPI.  Physical Exam: Vital signs in last 24 hours: Temp:  [98.1 F (36.7 C)-99 F (37.2 C)] 98.4 F (36.9 C) (04/10 0550) Pulse Rate:  [80-100] 80 (04/10 0550) Resp:  [14-18] 18 (04/10 0550) BP: (133-155)/(76-85) 146/76 (04/10 0550) SpO2:  [95 %-100 %] 97 % (04/10 0550) Weight:  [94 lb 12.8 oz (43 kg)-98 lb (44.5 kg)] 94 lb 12.8 oz (43 kg) (04/10 0550) Last BM Date: 11/24/16 General:   Alert, well-developed, white female in NAD Head:  Normocephalic and atraumatic. Eyes:  Sclera clear, no icterus.   Conjunctiva pink. Ears:  Normal auditory acuity. Nose:  No deformity, discharge,  or lesions. Mouth:  No  deformity or lesions.   Neck:  Supple; no masses Lungs:  Clear throughout to auscultation.   No wheezes, crackles, or rhonchi.  Heart:  Regular rate and rhythm; no murmurs, clicks, rubs,  or gallops. Abdomen:  Soft,nontender, BS active,no palp mass    Rectal:  Deferred  Msk:  Symmetrical without gross deformities. . Pulses:  Normal pulses noted. Extremities:  Without clubbing or edema. Neurologic:  Alert and  oriented x4;  grossly normal neurologically. Skin:  Intact without significant lesions or rashes.. Psych:  Alert and cooperative. Normal mood and affect.  Intake/Output from previous day: No intake/output data recorded. Intake/Output this shift: No intake/output data recorded.  Lab Results:  Recent Labs  11/23/16 2146  WBC 5.3  HGB 13.1  HCT 38.4  PLT 297   BMET  Recent Labs  11/23/16 2146  NA 133*  K 3.9  CL 94*  CO2 28  GLUCOSE 128*  BUN 13  CREATININE 0.75  CALCIUM 10.2   LFT  Recent Labs  11/23/16 2146  PROT 7.7  ALBUMIN 4.6  AST 35  ALT 33  ALKPHOS 121  BILITOT 0.9   PT/INR No results for input(s): LABPROT, INR in the last 72 hours. Hepatitis Panel No results for input(s): HEPBSAG, HCVAB, HEPAIGM, HEPBIGM in the last 72 hours.   Studies/Results: Ct Abdomen Pelvis W Contrast  Result Date: 11/24/2016 CLINICAL DATA:  Diffuse abdominal pain worsening over the last 2 weeks. Diarrhea earlier in the week now with nausea and vomiting for the past 24 hours. EXAM: CT ABDOMEN AND PELVIS WITH CONTRAST TECHNIQUE: Multidetector CT imaging of the abdomen and pelvis was performed using the standard protocol following bolus administration of intravenous contrast. CONTRAST:  80 cc Isovue-300 COMPARISON:  05/22/2016 FINDINGS: Lower chest: Normal size cardiac chambers. Small hiatal hernia. No pneumonic consolidation, effusion or pneumothorax. Hepatobiliary: Pneumobilia with biliary stenting and visualized. Some debris noted in the distal aspect of the stent as  before. No space-occupying mass of the liver. There is mild hepatic steatosis. Pancreas: Chronic mild ductal dilatation in the pancreatic head and body likely from mass effect the stent. No  space-occupying mass of the pancreas nor peripancreatic inflammation is noted. Spleen: Normal Adrenals/Urinary Tract: Stable punctate right upper pole renal calculus. Mild ectasia the right renal collecting system without obstructive uropathy, similar in appearance to prior. No space-occupying mass of kidneys. There is an upper pole left renal cyst measuring 4 mm and too small to further characterize. Urinary bladder is physiologically distended. Stomach/Bowel: Markedly distended fluid-filled stomach question gastroparesis. No significant small nor large bowel dilatation or obstruction. No bowel inflammation is noted. Diverticulosis is identified without acute diverticulitis. Moderate retention within left colon. Vascular/Lymphatic: Aortic atherosclerosis. No enlarged abdominal or pelvic lymph nodes. Reproductive: Subtle 1 cm hypodensity of the uterus on the right may reflect a small intramural fibroid. Calcification is seen anteriorly along the uterus. No adnexal mass. Other: No abdominal wall hernia or abnormality. No abdominopelvic ascites. Musculoskeletal: Levo scoliosis of the lumbar spine with lumbar spondylitic disc space narrowing from T12 through L3 and at L4-5 and L5-S1. Minimal grade 1 anterolisthesis L5 on S1. Broad-based disc bulges at L4-5 and L5-S1 with associated facet arthropathy. IMPRESSION: 1. Pneumobilia status post biliary stent. Minimal debris again noted in the distal aspect of the stent. No space-occupying mass of the liver. Hepatic steatosis. 2. Punctate right upper pole renal calculus without obstruction. Mild ectasia of the right renal collecting system as before, chronic in appearance. Small left renal cyst too small to further characterize at 4 mm in the upper pole. 3. Subtle hypodensity in the uterus  on the right may reflect a small intramural fibroid. 4. Marked gastric distention with fluid suspicious for gastroparesis. No small or large bowel obstruction or inflammation. 5. Spondylosis of the dorsal spine. Electronically Signed   By: Ashley Royalty M.D.   On: 11/24/2016 03:08     Tye Savoy, NP-C @  11/24/2016, 11:34 AM  Pager number [phone]  ________________________________________________________________________  [facility] MD note:  I personally examined the patient, reviewed the data and agree with the assessment and plan described above.  I reviewed her CT scan with radiologist and they agree that duodenum is possibly narrowed, post bulbar. This is persistent throughout several sequences and concerning for partial GOO.  Importantly this is in the region of her biliary stricture.  I'm planning to proceed with EGD evaluation of the duodenum tomorrow.  OK to keep her on clears liquids today. NPO after MN.   Owens Loffler, MD [facility] Pager [phone]

## 2016-11-24 NOTE — Progress Notes (Signed)
Consent is signed for the EGD.

## 2016-11-24 NOTE — H&P (Signed)
History and Physical    Summer Jones PJK:[id number] DOB: March 23, 1930 DOA: 11/23/2016  Referring MD/NP/PA:   PCP: [name], MD   Patient coming from:  The patient is coming from home.  At baseline, pt is independent for most of ADL.    Chief Complaint: Abdominal pain, nausea and vomiting  HPI: Summer Jones is a 83 y.o. female with medical history significant of CBD obstruction (s/p of stent), hypertension, hypothyroidism, who presents with abdominal pain, nausea and vomiting.  Patient states that her abdominal pain has been going on for a year intermittently. In the past 2 weeks, her abdominal pain becomes worse and more frequent. Since yesterday, the pain is significantly worse than before. It is associated with nausea and vomiting. She vomits each time she eats or drinks anything. She does not have diarrhea, fever or chills. Her abdominal pain is located mainly in the lower abdomen, constant, 7 out of 10 in severity, squeezing like pain and nonradiating. She also has heartburn symptoms. Patient denies chest pain, cough, shortness breath, symptoms of UTI or unilateral weakness.  Pt has been seen by GI, Dr. [name]. Per Dr. Audelia Acton not on 09/15/16, pt had vague, masslike area in the head of the pancreas. Cytology and pathology with forcep biopsies through interventional radiology on 3 separate occasions have never shown clear malignancy, but a few 'atypical cells' were noted.  ED Course: pt was found to have lipase 496, liver function normal, WBC 5.3, electrolytes renal function okay, urinalysis with moderate amount of leukocyte, but contains 0-5 squamous cells, temperature 99, oxygen saturation 95% on room air. Pt is admitted to [name] bed as inpatient.  # CT abdomen/pelvis showed:  1. Pneumobilia status post biliary stent. Minimal debris again noted in the distal aspect of the stent. No space-occupying mass of the liver. Hepatic steatosis. EDP called radiologist regarding pneumobilia, and  was told that this is normal finding at s/p of stent placement. 2. Punctate right upper pole renal calculus without obstruction. Mild ectasia of the right renal collecting system as before, chronic in appearance. Small left renal cyst too small to further characterize at 4 mm in the upper pole. 3. Subtle hypodensity in the uterus on the right may reflect a small intramural fibroid. 4. Marked gastric distention with fluid suspicious for gastroparesis. No small or large bowel obstruction or inflammation. 5. Spondylosis of the dorsal spine.  Review of Systems:   General: no fevers, chills, no changes in body weight, has poor appetite, has fatigue HEENT: no blurry vision, hearing changes or sore throat Respiratory: no dyspnea, coughing, wheezing CV: no chest pain, no palpitations GI: has nausea, vomiting, abdominal pain, no iarrhea, constipation GU: no dysuria, burning on urination, increased urinary frequency, hematuria  Ext: no leg edema Neuro: no unilateral weakness, numbness, or tingling, no vision change or hearing loss Skin: no rash, no skin tear. MSK: No muscle spasm, no deformity, no limitation of range of movement in spin Heme: No easy bruising.  Travel history: No recent long distant travel.  Allergy:  Allergies  Allergen Reactions  . Sulfamethoxazole Nausea Only    Past Medical History:  Diagnosis Date  . Arthritis   . Common bile duct (CBD) obstruction   . Difficult intubation Anterior glottis, short TMD, limited mouth opening. Required glidescope and bougie stylet with multiple attempts.  . Diverticular disease   . Diverticulosis   . Essential hypertension   . Hypertension   . Hypothyroidism   . Jaundice   . Mitral valve  prolapse   . Thyroid disease     Past Surgical History:  Procedure Laterality Date  . BREAST BIOPSY    . BREAST CYST EXCISION    . ENDOSCOPIC RETROGRADE CHOLANGIOPANCREATOGRAPHY (ERCP) WITH PROPOFOL N/A 08/08/2015   Procedure: ENDOSCOPIC  RETROGRADE CHOLANGIOPANCREATOGRAPHY (ERCP) WITH PROPOFOL;  Surgeon: [name], MD;  Location: [facility];  Service: Endoscopy;  Laterality: N/A;  . 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 EUS N/A 08/08/2015   Procedure: UPPER ENDOSCOPIC ULTRASOUND (EUS) RADIAL;  Surgeon: [name], MD;  Location: [facility];  Service: Endoscopy;  Laterality: N/A;  . INGUINAL HERNIA REPAIR     right  . SPYGLASS CHOLANGIOSCOPY N/A 12/26/2015   Procedure: [id number] CHOLANGIOSCOPY;  Surgeon: [name], MD;  Location: [facility];  Service: Endoscopy;  Laterality: N/A;  . TONSILLECTOMY      Social History:  reports that she has never smoked. She has never used smokeless tobacco. She reports that she does not drink alcohol or use drugs.  Family History:  Family History  Problem Relation Age of Onset  . Breast cancer Sister   . Heart disease Father      Prior to Admission medications   Medication Sig Start Date End Date Taking? Authorizing Provider  amLODipine (NORVASC) 10 MG tablet Take 10 mg by mouth at bedtime.  06/16/15  Yes Historical Provider, MD  hyoscyamine (LEVSIN) 0.125 MG tablet Take 1 to 2 tablets three times a day if needed for abdominal spasms 05/22/16  Yes [name], MD  levothyroxine (SYNTHROID, LEVOTHROID) 25 MCG tablet Take 25 mcg by mouth daily before breakfast.   Yes Historical Provider, MD  lisinopril (PRINIVIL,ZESTRIL) 5 MG tablet Take 5 mg by mouth at bedtime.    Yes Historical Provider, MD  Multiple Vitamins-Minerals (MULTIVITAMIN GUMMIES ADULT) CHEW Chew 1 tablet by mouth daily.   Yes Historical Provider, MD  polyethylene glycol powder (GLYCOLAX/MIRALAX) powder Take 17 g by mouth as needed for mild  constipation.    Yes Historical Provider, MD  polyvinyl alcohol (LIQUIFILM TEARS) 1.4 % ophthalmic solution Place 1 drop into both eyes daily as needed for dry eyes.   Yes Historical Provider, MD  HYDROcodone-acetaminophen (NORCO/VICODIN) 5-325 MG tablet Take 2 tablets by mouth every 4 (four) hours as needed. Patient not taking: Reported on 11/23/2016 09/06/15   Noemi Chapel, MD    Physical Exam: Vitals:   11/24/16 0100 11/24/16 0220 11/24/16 0452 11/24/16 0550  BP: 140/82 133/77 135/80 (!) 146/76  Pulse: 84 92 95 80  Resp: 16 14 16 18   Temp:    98.4 F (36.9 C)  TempSrc:    Oral  SpO2: 100% 97% 100% 97%  Weight:    43 kg (94 lb 12.8 oz)  Height:    4\' 11"  (1.499 m)   General: Not in acute distress HEENT:       Eyes: PERRL, EOMI, no scleral icterus.       ENT: No discharge from the ears and nose, no pharynx injection, no tonsillar enlargement.        Neck: No JVD, no bruit, no mass felt. Heme: No neck lymph node enlargement. Cardiac: S1/S2, RRR, No murmurs, No gallops or rubs. Respiratory:  No rales, wheezing, rhonchi or rubs. GI: Soft, nondistended, mild tenderness in lower abdomen.  No rebound pain, no organomegaly, BS present. GU: No hematuria Ext: No pitting leg edema bilaterally. 2+DP/PT pulse bilaterally. Musculoskeletal: No joint deformities, No joint redness or warmth, no limitation of ROM in spin. Skin: No rashes.  Neuro: Alert, oriented X3, cranial nerves II-XII grossly intact, moves all extremities normally Psych: Patient is not psychotic, no suicidal or hemocidal ideation.  Labs on Admission: I have personally reviewed following labs and imaging studies  CBC:  Recent Labs Lab 11/23/16 2146  WBC 5.3  HGB 13.1  HCT 38.4  MCV 90.8  PLT 295   Basic Metabolic Panel:  Recent Labs Lab 11/23/16 2146  NA 133*  K 3.9  CL 94*  CO2 28  GLUCOSE 128*  BUN 13  CREATININE 0.75  CALCIUM 10.2   GFR: Estimated Creatinine Clearance: 35.5 mL/min (by C-G formula based  on SCr of 0.75 mg/dL). Liver Function Tests:  Recent Labs Lab 11/23/16 2146  AST 35  ALT 33  ALKPHOS 121  BILITOT 0.9  PROT 7.7  ALBUMIN 4.6    Recent Labs Lab 11/23/16 2146  LIPASE 496*   No results for input(s): AMMONIA in the last 168 hours. Coagulation Profile: No results for input(s): INR, PROTIME in the last 168 hours. Cardiac Enzymes: No results for input(s): CKTOTAL, CKMB, CKMBINDEX, TROPONINI in the last 168 hours. BNP (last 3 results) No results for input(s): PROBNP in the last 8760 hours. HbA1C: No results for input(s): HGBA1C in the last 72 hours. CBG: No results for input(s): GLUCAP in the last 168 hours. Lipid Profile: No results for input(s): CHOL, HDL, LDLCALC, TRIG, CHOLHDL, LDLDIRECT in the last 72 hours. Thyroid Function Tests: No results for input(s): TSH, T4TOTAL, FREET4, T3FREE, THYROIDAB in the last 72 hours. Anemia Panel: No results for input(s): VITAMINB12, FOLATE, FERRITIN, TIBC, IRON, RETICCTPCT in the last 72 hours. Urine analysis:    Component Value Date/Time   COLORURINE YELLOW 11/23/2016 2133   APPEARANCEUR HAZY (A) 11/23/2016 2133   LABSPEC 1.020 11/23/2016 2133   PHURINE 5.0 11/23/2016 2133   GLUCOSEU NEGATIVE 11/23/2016 2133   HGBUR NEGATIVE 11/23/2016 2133   BILIRUBINUR NEGATIVE 11/23/2016 2133   KETONESUR 80 (A) 11/23/2016 2133   PROTEINUR 30 (A) 11/23/2016 2133   NITRITE NEGATIVE 11/23/2016 2133   LEUKOCYTESUR MODERATE (A) 11/23/2016 2133   Sepsis Labs: @LABRCNTIP (procalcitonin:4,lacticidven:4) )No results found for this or any previous visit (from the past 240 hour(s)).   Radiological Exams on Admission: Ct Abdomen Pelvis W Contrast  Result Date: 11/24/2016 CLINICAL DATA:  Diffuse abdominal pain worsening over the last 2 weeks. Diarrhea earlier in the week now with nausea and vomiting for the past 24 hours. EXAM: CT ABDOMEN AND PELVIS WITH CONTRAST TECHNIQUE: Multidetector CT imaging of the abdomen and pelvis was  performed using the standard protocol following bolus administration of intravenous contrast. CONTRAST:  80 cc Isovue-300 COMPARISON:  05/22/2016 FINDINGS: Lower chest: Normal size cardiac chambers. Small hiatal hernia. No pneumonic consolidation, effusion or pneumothorax. Hepatobiliary: Pneumobilia with biliary stenting and visualized. Some debris noted in the distal aspect of the stent as before. No space-occupying mass of the liver. There is mild hepatic steatosis. Pancreas: Chronic mild ductal dilatation in the pancreatic head and body likely from mass effect the stent. No space-occupying mass of the pancreas nor peripancreatic inflammation is noted. Spleen: Normal Adrenals/Urinary Tract: Stable punctate right upper pole renal calculus. Mild ectasia the right renal collecting system without obstructive uropathy, similar in appearance to prior. No space-occupying mass of kidneys. There is an upper  pole left renal cyst measuring 4 mm and too small to further characterize. Urinary bladder is physiologically distended. Stomach/Bowel: Markedly distended fluid-filled stomach question gastroparesis. No significant small nor large bowel dilatation or obstruction. No bowel inflammation is noted. Diverticulosis is identified without acute diverticulitis. Moderate retention within left colon. Vascular/Lymphatic: Aortic atherosclerosis. No enlarged abdominal or pelvic lymph nodes. Reproductive: Subtle 1 cm hypodensity of the uterus on the right may reflect a small intramural fibroid. Calcification is seen anteriorly along the uterus. No adnexal mass. Other: No abdominal wall hernia or abnormality. No abdominopelvic ascites. Musculoskeletal: Levo scoliosis of the lumbar spine with lumbar spondylitic disc space narrowing from T12 through L3 and at L4-5 and L5-S1. Minimal grade 1 anterolisthesis L5 on S1. Broad-based disc bulges at L4-5 and L5-S1 with associated facet arthropathy. IMPRESSION: 1. Pneumobilia status post biliary  stent. Minimal debris again noted in the distal aspect of the stent. No space-occupying mass of the liver. Hepatic steatosis. 2. Punctate right upper pole renal calculus without obstruction. Mild ectasia of the right renal collecting system as before, chronic in appearance. Small left renal cyst too small to further characterize at 4 mm in the upper pole. 3. Subtle hypodensity in the uterus on the right may reflect a small intramural fibroid. 4. Marked gastric distention with fluid suspicious for gastroparesis. No small or large bowel obstruction or inflammation. 5. Spondylosis of the dorsal spine. Electronically Signed   By: Ashley Royalty M.D.   On: 11/24/2016 03:08     EKG: Not done in ED, will get one.   Assessment/Plan Principal Problem:   Abdominal pain Active Problems:   Hypothyroidism   Essential hypertension   Common bile duct (CBD) obstruction   UTI (urinary tract infection)   Abdominal pain, nausea and vomiting: Etiology is not clear. Lipase is elevated at 496. CT abdomen/pelvis showed no space-occupying mass of the liver; chronic mild ductal dilatation in the pancreatic head and body which is likely from mass effect the stent per radiologist. No space-occupying mass of the pancreas nor peripancreatic inflammation is noted. I dicussed with pt about doing a MRCP for further evaluation. She would like [place] to discuss with her GI, Dr. [name] first. Gastroparesis as shown by CT scan may have also contributed partially.  -will admit to med-surg bed as inpt -please call Dr. [name] or his colleague [facility]) -keep pt NPO -When necessary morphine for pain -When necessary Zofran for nausea and vomiting -Reglan 5 mg twice a day for gastroparesis-->f/u EKG for monitoring QTc -IVF: 1LNS, then 125 cc/h  Hypothyroidism: Last TSH was on 3.776 on 08/07/15 -Continue home Synthroid  HTN: Blood pressure 155/85 -Continue amlodipine, lisinopril -IV hydralazine when  necessary  Possible UTI: Patient denies dysuria, burning on urination or urinary frequency. Her urine analysis showed moderate amount of leukocyte, but continues 0-5 squamous cells. Since patient has nausea and vomiting, may partially from possible UTI.  Will treat empirically. -IVF Rocephin -Follow-up blood culture and urine culture   DVT ppx: SCD Code Status: Full code Family Communication: None at bed side.              Yes, patient's  husband at bed side Disposition Plan:  Anticipate discharge back to previous home environment Consults called:  none Admission status: medical floor/inpt  Date of Service 11/24/2016    Ivor Costa [facility] Pager [phone]  If 7PM-7AM, please contact night-coverage [email or web address] Password [email or web address] 11/24/2016, 5:55 AM

## 2016-11-24 NOTE — Telephone Encounter (Signed)
Has been admitted

## 2016-11-24 NOTE — ED Notes (Signed)
Pt reports having pain in stomach after starting to drink oral contrast for CT scan. MD notified and order for Protonix given.

## 2016-11-24 NOTE — ED Notes (Signed)
Pt reports having an episode of vomiting after trying to drink oral contrast but now reports having improvement in abd pain. Pt stated that she would not be able to drink any further oral contrast.

## 2016-11-25 ENCOUNTER — Encounter (HOSPITAL_COMMUNITY): Payer: Self-pay | Admitting: Gastroenterology

## 2016-11-25 ENCOUNTER — Inpatient Hospital Stay (HOSPITAL_COMMUNITY): Payer: Medicare Other | Admitting: Anesthesiology

## 2016-11-25 ENCOUNTER — Encounter (HOSPITAL_COMMUNITY)
Admission: EM | Disposition: A | Discharge status: Home / Self Care | Payer: Self-pay | Source: Home / Self Care | Attending: Internal Medicine

## 2016-11-25 DIAGNOSIS — K311 Adult hypertrophic pyloric stenosis: Secondary | ICD-10-CM

## 2016-11-25 DIAGNOSIS — N390 Urinary tract infection, site not specified: Secondary | ICD-10-CM

## 2016-11-25 DIAGNOSIS — I10 Essential (primary) hypertension: Secondary | ICD-10-CM

## 2016-11-25 DIAGNOSIS — E038 Other specified hypothyroidism: Secondary | ICD-10-CM

## 2016-11-25 HISTORY — PX: ESOPHAGOGASTRODUODENOSCOPY (EGD) WITH PROPOFOL: SHX5813

## 2016-11-25 LAB — COMPREHENSIVE METABOLIC PANEL
ALBUMIN: 3.1 g/dL — AB (ref 3.5–5.0)
ALT: 22 U/L (ref 14–54)
AST: 31 U/L (ref 15–41)
Alkaline Phosphatase: 78 U/L (ref 38–126)
Anion gap: 5 (ref 5–15)
BUN: 7 mg/dL (ref 6–20)
CHLORIDE: 107 mmol/L (ref 101–111)
CO2: 22 mmol/L (ref 22–32)
CREATININE: 0.49 mg/dL (ref 0.44–1.00)
Calcium: 8.2 mg/dL — ABNORMAL LOW (ref 8.9–10.3)
GFR calc Af Amer: >60 mL/min (ref >60)
GFR calc non Af Amer: >60 mL/min (ref >60)
Glucose, Bld: 110 mg/dL — ABNORMAL HIGH (ref 65–99)
Potassium: 3.5 mmol/L (ref 3.5–5.1)
SODIUM: 134 mmol/L — AB (ref 135–145)
Total Bilirubin: 0.9 mg/dL (ref 0.3–1.2)
Total Protein: 5.5 g/dL — ABNORMAL LOW (ref 6.5–8.1)

## 2016-11-25 LAB — URINE CULTURE

## 2016-11-25 LAB — GLUCOSE, CAPILLARY: Glucose-Capillary: 109 mg/dL — ABNORMAL HIGH (ref 65–99)

## 2016-11-25 LAB — CBC
HCT: 31.2 % — ABNORMAL LOW (ref 36.0–46.0)
Hemoglobin: 10.7 g/dL — ABNORMAL LOW (ref 12.0–15.0)
MCH: 31.4 pg (ref 26.0–34.0)
MCHC: 34.3 g/dL (ref 30.0–36.0)
MCV: 91.5 fL (ref 78.0–100.0)
PLATELETS: 245 10*3/uL (ref 150–400)
RBC: 3.41 MIL/uL — ABNORMAL LOW (ref 3.87–5.11)
RDW: 13.8 % (ref 11.5–15.5)
WBC: 4.5 10*3/uL (ref 4.0–10.5)

## 2016-11-25 LAB — LIPASE, BLOOD: Lipase: 273 U/L — ABNORMAL HIGH (ref 11–51)

## 2016-11-25 SURGERY — ESOPHAGOGASTRODUODENOSCOPY (EGD) WITH PROPOFOL
Anesthesia: Monitor Anesthesia Care

## 2016-11-25 MED ORDER — PROPOFOL 10 MG/ML IV BOLUS
INTRAVENOUS | Status: AC
  Filled 2016-11-25: qty 40

## 2016-11-25 MED ORDER — PROPOFOL 500 MG/50ML IV EMUL
INTRAVENOUS | Status: DC | PRN
  Administered 2016-11-25: 100 ug/kg/min via INTRAVENOUS

## 2016-11-25 MED ORDER — PROPOFOL 500 MG/50ML IV EMUL
INTRAVENOUS | Status: DC | PRN
  Administered 2016-11-25: 10 mg via INTRAVENOUS
  Administered 2016-11-25: 30 mg via INTRAVENOUS
  Administered 2016-11-25: 10 mg via INTRAVENOUS

## 2016-11-25 SURGICAL SUPPLY — 14 items

## 2016-11-25 NOTE — Anesthesia Preprocedure Evaluation (Addendum)
Anesthesia Evaluation  Patient identified by MRN, date of birth, ID band Patient awake    Reviewed: Allergy & Precautions, NPO status , Patient's Chart, lab work & pertinent test results  History of Anesthesia Complications (+) DIFFICULT AIRWAY  Airway Mallampati: II  TM Distance: >3 FB Neck ROM: Full    Dental no notable dental hx.    Pulmonary neg pulmonary ROS,    Pulmonary exam normal breath sounds clear to auscultation       Cardiovascular hypertension, Normal cardiovascular exam Rhythm:Regular Rate:Normal     Neuro/Psych negative neurological ROS  negative psych ROS   GI/Hepatic negative GI ROS, Neg liver ROS,   Endo/Other  Hypothyroidism   Renal/GU negative Renal ROS  negative genitourinary   Musculoskeletal negative musculoskeletal ROS (+)   Abdominal   Peds negative pediatric ROS (+)  Hematology  (+) anemia ,   Anesthesia Other Findings   Reproductive/Obstetrics negative OB ROS                             Anesthesia Physical Anesthesia Plan  ASA: II  Anesthesia Plan: MAC   Post-op Pain Management:    Induction: Intravenous  Airway Management Planned: Nasal Cannula  Additional Equipment:   Intra-op Plan:   Post-operative Plan:   Informed Consent: I have reviewed the patients History and Physical, chart, labs and discussed the procedure including the risks, benefits and alternatives for the proposed anesthesia with the patient or authorized representative who has indicated his/her understanding and acceptance.   Dental advisory given  Plan Discussed with: CRNA and Surgeon  Anesthesia Plan Comments:         Anesthesia Quick Evaluation

## 2016-11-25 NOTE — Transfer of Care (Signed)
Immediate Anesthesia Transfer of Care Note  Patient: Summer Jones  Procedure(s) Performed: Procedure(s): ESOPHAGOGASTRODUODENOSCOPY (EGD) WITH PROPOFOL (N/A)  Patient Location: PACU  Anesthesia Type:MAC  Level of Consciousness: awake, alert  and oriented  Airway & Oxygen Therapy: Patient Spontanous Breathing and Patient connected to nasal cannula oxygen  Post-op Assessment: Report given to RN and Post -op Vital signs reviewed and stable  Post vital signs: Reviewed and stable  Last Vitals:  Vitals:   11/25/16 0532 11/25/16 1333  BP: (!) 105/57 (!) 145/62  Pulse: 69   Resp: 16 19  Temp: 36.8 C 36.8 C    Last Pain:  Vitals:   11/25/16 1333  TempSrc: Oral  PainSc:          Complications: No apparent anesthesia complications

## 2016-11-25 NOTE — Op Note (Signed)
[facility] Patient Name: Summer Jones Procedure Date: 11/25/2016 MRN: [id number] Attending MD: [name] , MD Date of Birth: [birth year] CSN: [id number] Age: 83 Admit Type: Inpatient Procedure:                Upper GI endoscopy Indications:              Abnormal CT of the GI tract, Nausea with vomiting Providers:                [name], MD, Elmer Ramp. Tilden Dome, RN, Alfonso Patten, Technician, Rosario Adie, CRNA Referring MD:              Medicines:                Monitored Anesthesia Care Complications:            No immediate complications. Estimated blood loss:                            None. Estimated Blood Loss:     Estimated blood loss: none. Procedure:                Pre-Anesthesia Assessment:                           - Prior to the procedure, a History and Physical                            was performed, and patient medications and                            allergies were reviewed. The patient's tolerance of                            previous anesthesia was also reviewed. The risks                            and benefits of the procedure and the sedation                            options and risks were discussed with the patient.                            All questions were answered, and informed consent                            was obtained. Prior Anticoagulants: The patient has                            taken no previous anticoagulant or antiplatelet                            agents. ASA Grade Assessment: III - A patient with  severe systemic disease. After reviewing the risks                            and benefits, the patient was deemed in                            satisfactory condition to undergo the procedure.                           After obtaining informed consent, the endoscope was                            passed under direct vision. Throughout the        procedure, the patient's blood pressure, pulse, and                            oxygen saturations were monitored continuously. The                            [id number] ([id number]) scope was introduced through the                            mouth, and advanced to the duodenal bulb. The upper                            GI endoscopy was accomplished without difficulty.                            The patient tolerated the procedure well. Scope In: Scope Out: Findings:      There was no retained fluid or food in the stomach.      There was edematous stenosis at the distal most aspect of the duodenal       bulb. The mucosa was NOT neoplastic appearing but was very edematous and       spastic. This is most consistent with an extrinsic process. I could not       advance through this stenosis.      One mild benign-appearing, intrinsic stenosis was found at the       gastroesophageal junction above a 2cm hiatal hernia. This appeared       peptic related and it was disrupted by passage of the gastroscope. Impression:               - Mild peptic GE junction stricture, dilated with                            scope passage.                           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 I am                            suspicious that the biliary process is progressing                            and causing extrinsic compression of the duodenum,                            evolving gastric outlet obstruction. I'm going to                            order  UGI barium examination to undertand the                            duodenal stenosis better and I'm asking general                            surgery to see her. She may benefit from                            gastrojejenostomy. Moderate Sedation:      N/A- Per Anesthesia Care Recommendation:           - Return patient to hospital ward for ongoing care.                           - Clear liquid diet for now.                           - Continue present medications. Procedure Code(s):        --- Professional ---                           [phone], Esophagogastroduodenoscopy, flexible,                            transoral; diagnostic, including collection of                            specimen(s) by brushing or washing, when performed                            (  separate procedure) Diagnosis Code(s):        --- Professional ---                           K22.2, Esophageal obstruction                           R11.2, Nausea with vomiting, unspecified                           R93.3, Abnormal findings on diagnostic imaging of                            other parts of digestive tract CPT copyright 2016 [employer]. All rights reserved. The codes documented in this report are preliminary and upon coder review may  be revised to meet current compliance requirements. [name], MD 11/25/2016 3:24:25 PM This report has been signed electronically. Number of Addenda: 0

## 2016-11-25 NOTE — Progress Notes (Addendum)
Patient ID: Summer Jones, female   DOB: 28-Oct-1929, 83 y.o.   MRN: [id number]  PROGRESS NOTE    Summer Jones  CHE:[id number] DOB: [birth year] DOA: 11/23/2016  PCP: [name], MD   Brief Narrative:  83 year old female with medical history significant for CBD obstruction status post stent, hypertension, hypothyroidism who presented to [facility] long with abdominal pain, nausea and vomiting. Over the past 2 weeks her abdominal pain became worse and more frequent located in the mid abdomen and left and right side. Pain is about 7 out of 10 in intensity, constant. Patient reported vomiting each time she eats or drinks. No fevers, no diarrhea. Pt has been seen by GI, Dr. [name]. Per Dr. Audelia Acton note on 09/15/16, pt had vague, masslike area in the head of the pancreas. Cytology andpathology with forcep biopsies through interventional radiology on 3 separate occasions have never shown clear malignancy,but a few 'atypical cells' were noted.  On admission, patient was hemodynamically stable. Lipase was 496, normal LFTs. There were moderate amount of leukocytes on urinalysis.CT abdomen and pelvis showed pneumobilia status post biliary stent with minimal debris in the distal aspect of the stent but no space occupying mass of the liver, right upper pole renal calculus without obstruction.  GI has seen the patient in consultation and plan is for EGD today.   Assessment & Plan:   Principal Problem:   Abdominal pain concerning for partial gastric outlet obstruction - Appreciate GI recommendations - Concern for narrowed duodenum, post bulbar and concerning for partial gastric outlet obstruction - Plan for EGD today - Continue nothing by mouth - Continue IV fluids  Active Problems:   UTI - Patient started on Rocephin empirically while awaiting urine culture results - Urine culture with multiple species non-predominant - Patient will receive Rocephin today and then we will stop Rocephin after today   Hypothyroidism - Continue Synthroid 25 g daily    Essential hypertension - Continue Norvasc 10 mg daily and lisinopril 5 mg at bedtime   DVT prophylaxis: SCDs bilaterally  Code Status: full code  Family Communication: Husband at the bedside Disposition Plan: Plan for EGD today   Consultants:   Gastroenterology, Dr. [name]  Procedures:   Plan for EGD today  Antimicrobials:   Rocephin 11/24/2016    Subjective: No overnight events  Objective: Vitals:   11/24/16 0550 11/24/16 1255 11/24/16 2048 11/25/16 0532  BP: (!) 146/76 130/70 139/73 (!) 105/57  Pulse: 80 88 71 69  Resp: 18 16 16 16   Temp: 98.4 F (36.9 C) 98.1 F (36.7 C) 98.1 F (36.7 C) 98.3 F (36.8 C)  TempSrc: Oral Oral Oral Oral  SpO2: 97% 96% 98% 97%  Weight: 43 kg (94 lb 12.8 oz)     Height: 4\' 11"  (1.499 m)       Intake/Output Summary (Last 24 hours) at 11/25/16 0908 Last data filed at 11/24/16 1900  Gross per 24 hour  Intake              240 ml  Output                0 ml  Net              240 ml   Filed Weights   11/23/16 2127 11/24/16 0550  Weight: 44.5 kg (98 lb) 43 kg (94 lb 12.8 oz)    Examination:  General exam: Appears calm and comfortable  Respiratory system: Clear to auscultation. Respiratory effort normal. Cardiovascular system: S1 &  S2 heard, RRR. No JVD, murmurs, rubs, gallops or clicks. No pedal edema. Gastrointestinal system: Abdomen is nondistended, soft and nontender.  Central nervous system: Alert and oriented. No focal neurological deficits. Extremities: Symmetric 5 x 5 power. Skin: No rashes, lesions or ulcers Psychiatry: Judgement and insight appear normal. Mood & affect appropriate.   Data Reviewed: I have personally reviewed following labs and imaging studies  CBC:  Recent Labs Lab 11/23/16 2146 11/25/16 0338  WBC 5.3 4.5  HGB 13.1 10.7*  HCT 38.4 31.2*  MCV 90.8 91.5  PLT 297 161   Basic Metabolic Panel:  Recent Labs Lab 11/23/16 2146  11/25/16 0338  NA 133* 134*  K 3.9 3.5  CL 94* 107  CO2 28 22  GLUCOSE 128* 110*  BUN 13 7  CREATININE 0.75 0.49  CALCIUM 10.2 8.2*   GFR: Estimated Creatinine Clearance: 35.5 mL/min (by C-G formula based on SCr of 0.49 mg/dL). Liver Function Tests:  Recent Labs Lab 11/23/16 2146 11/25/16 0338  AST 35 31  ALT 33 22  ALKPHOS 121 78  BILITOT 0.9 0.9  PROT 7.7 5.5*  ALBUMIN 4.6 3.1*    Recent Labs Lab 11/23/16 2146 11/25/16 0338  LIPASE 496* 273*   No results for input(s): AMMONIA in the last 168 hours. Coagulation Profile: No results for input(s): INR, PROTIME in the last 168 hours. Cardiac Enzymes: No results for input(s): CKTOTAL, CKMB, CKMBINDEX, TROPONINI in the last 168 hours. BNP (last 3 results) No results for input(s): PROBNP in the last 8760 hours. HbA1C: No results for input(s): HGBA1C in the last 72 hours. CBG:  Recent Labs Lab 11/24/16 0735 11/25/16 0754  GLUCAP 118* 109*   Lipid Profile: No results for input(s): CHOL, HDL, LDLCALC, TRIG, CHOLHDL, LDLDIRECT in the last 72 hours. Thyroid Function Tests: No results for input(s): TSH, T4TOTAL, FREET4, T3FREE, THYROIDAB in the last 72 hours. Anemia Panel: No results for input(s): VITAMINB12, FOLATE, FERRITIN, TIBC, IRON, RETICCTPCT in the last 72 hours. Urine analysis:    Component Value Date/Time   COLORURINE YELLOW 11/23/2016 2133   APPEARANCEUR HAZY (A) 11/23/2016 2133   LABSPEC 1.020 11/23/2016 2133   PHURINE 5.0 11/23/2016 2133   GLUCOSEU NEGATIVE 11/23/2016 2133   HGBUR NEGATIVE 11/23/2016 2133   BILIRUBINUR NEGATIVE 11/23/2016 2133   KETONESUR 80 (A) 11/23/2016 2133   PROTEINUR 30 (A) 11/23/2016 2133   NITRITE NEGATIVE 11/23/2016 2133   LEUKOCYTESUR MODERATE (A) 11/23/2016 2133   Sepsis Labs: @LABRCNTIP (procalcitonin:4,lacticidven:4)   Recent Results (from the past 240 hour(s))  Urine culture     Status: Abnormal   Collection Time: 11/23/16 11:30 PM  Result Value Ref Range  Status   Specimen Description URINE, CLEAN CATCH  Final   Special Requests NONE  Final   Culture MULTIPLE SPECIES PRESENT, SUGGEST RECOLLECTION (A)  Final   Report Status 11/25/2016 FINAL  Final      Radiology Studies: Ct Abdomen Pelvis W Contrast Result Date: 11/24/2016 1. Pneumobilia status post biliary stent. Minimal debris again noted in the distal aspect of the stent. No space-occupying mass of the liver. Hepatic steatosis. 2. Punctate right upper pole renal calculus without obstruction. Mild ectasia of the right renal collecting system as before, chronic in appearance. Small left renal cyst too small to further characterize at 4 mm in the upper pole. 3. Subtle hypodensity in the uterus on the right may reflect a small intramural fibroid. 4. Marked gastric distention with fluid suspicious for gastroparesis. No small or large bowel obstruction or inflammation.  5. Spondylosis of the dorsal spine. Electronically Signed   By: Ashley Royalty M.D.   On: 11/24/2016 03:08     Scheduled Meds: . amLODipine  10 mg Oral QHS  . cefTRIAXone (ROCEPHIN)  IV  1 g Intravenous Q24H  . levothyroxine  25 mcg Oral QAC breakfast  . lisinopril  5 mg Oral QHS  . metoCLOPramide (REGLAN) injection  5 mg Intravenous BID  . multivitamin with minerals  1 tablet Oral Daily  . pantoprazole (PROTONIX) IV  40 mg Intravenous Daily  . polyethylene glycol  17 g Oral BID   Continuous Infusions: . sodium chloride 125 mL/hr at 11/25/16 0837  . sodium chloride       LOS: 1 day    Time spent: 25 minutes  Greater than 50% of the time spent on counseling and coordinating the care.   Leisa Lenz, MD [facility] Pager [phone]  If 7PM-7AM, please contact night-coverage [email or web address] Password [email or web address] 11/25/2016, 9:08 AM

## 2016-11-25 NOTE — Interval H&P Note (Signed)
History and Physical Interval Note:  11/25/2016 1:25 PM  Summer Jones  has presented today for surgery, with the diagnosis of vomiting, abnormal stomach, ? GOO  The various methods of treatment have been discussed with the patient and family. After consideration of risks, benefits and other options for treatment, the patient has consented to  Procedure(s): ESOPHAGOGASTRODUODENOSCOPY (EGD) WITH PROPOFOL (N/A) as a surgical intervention .  The patient's history has been reviewed, patient examined, no change in status, stable for surgery.  I have reviewed the patient's chart and labs.  Questions were answered to the patient's satisfaction.     [name]

## 2016-11-25 NOTE — Anesthesia Postprocedure Evaluation (Signed)
Anesthesia Post Note  Patient: Summer Jones  Procedure(s) Performed: Procedure(s) (LRB): ESOPHAGOGASTRODUODENOSCOPY (EGD) WITH PROPOFOL (N/A)  Patient location during evaluation: PACU Anesthesia Type: MAC Level of consciousness: awake and alert Pain management: pain level controlled Vital Signs Assessment: post-procedure vital signs reviewed and stable Respiratory status: spontaneous breathing, nonlabored ventilation, respiratory function stable and patient connected to nasal cannula oxygen Cardiovascular status: stable and blood pressure returned to baseline Anesthetic complications: no       Last Vitals:  Vitals:   11/25/16 1333 11/25/16 1515  BP: (!) 145/62 (!) 110/57  Pulse:  69  Resp: 19 (!) 31  Temp: 36.8 C 36.6 C    Last Pain:  Vitals:   11/25/16 1515  TempSrc: Oral  PainSc:                  [name] S

## 2016-11-25 NOTE — H&P (View-Only) (Signed)
Referring Provider: [facility]   Primary Care Physician:  [name], MD Primary Gastroenterologist: Oretha Caprice,  MD  Reason for Consultation:  Abdominal pain  ASSESSMENT AND PLAN:   66.  83 year old female with acute on chronic lower abdominal pain. Antispasmodics have helped in past. Yesterday pain escalated, she had associated vomiting and imaging shows markedly distended stomach without small or large bowel obstruction.  However, Dr. [name] reviewed imaging and feels there is some dilation of duodenum down to area of biliary stent.  Lipase in 400s but no radiographic evidence for pancreatitis.  Her pain is lower than one would expect for pancreatitis and this is the same pain she has had for months (just worse).  -She is feeling better today. Abdomen is not distended and + bowel sounds. She wants something to drink. Not sure why these acute symptoms. Try clears today, if recurrent vomiting then there probably is a mechanical obstruction and will workup accordingly.    2. Biliary stricture. Has not been proven by biopsies or brushings to be malignant . She is decompressed with metal stent. LFTs are normal and only minimal debris in distal aspect of stent on imaging. No need for an MRCP at this point.   3. Constipation.  -miralax BID   HPI: RHYLYNN PERDOMO is a 83 y.o. female known to Dr. [name] for hx of a bile duct stricture. Despite brushings and biopsies of the stricture no malignancy has been diagnosed. She has a covered, removal metal stent in place, she has not been interested in surgery. Surveillance CTscan in October showed stent in places, no pancreatic masses. She has suffered with spasm type lower abdominal pain for months.No urinary sx. Antispasmodics helped in the past. Patient call our office yesterday with complaints of persistent abdominal pain and constipation. We increased antispasmodic and miralax. Later advised to come to ED as pain worsened and she developed  vomiting. Couldn't keep anything down yesterday. CTscan shows miminal debris in distal aspect of stent, no duct dilation or masses. There is marked gastric distention without evidence for small or large bowel obstruction. WBC normal. Lipase in 496, LFTs are normal.    Past Medical History:  Diagnosis Date  . Arthritis   . Common bile duct (CBD) obstruction   . Difficult intubation Anterior glottis, short TMD, limited mouth opening. Required glidescope and bougie stylet with multiple attempts.  . Diverticular disease   . Diverticulosis   . Essential hypertension   . Hypertension   . Hypothyroidism   . Jaundice   . Mitral valve prolapse   . Thyroid disease     Past Surgical History:  Procedure Laterality Date  . BREAST BIOPSY    . BREAST CYST EXCISION    . ENDOSCOPIC RETROGRADE CHOLANGIOPANCREATOGRAPHY (ERCP) WITH PROPOFOL N/A 08/08/2015   Procedure: ENDOSCOPIC RETROGRADE CHOLANGIOPANCREATOGRAPHY (ERCP) WITH PROPOFOL;  Surgeon: [name], MD;  Location: [facility] ENDOSCOPY;  Service: Endoscopy;  Laterality: N/A;  . 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 EUS N/A 08/08/2015   Procedure: UPPER ENDOSCOPIC ULTRASOUND (EUS) RADIAL;  Surgeon: [name], MD;  Location: [facility] ENDOSCOPY;  Service: Endoscopy;  Laterality: N/A;  . INGUINAL HERNIA REPAIR     right  . SPYGLASS CHOLANGIOSCOPY N/A 12/26/2015   Procedure: [id number] CHOLANGIOSCOPY;  Surgeon: Quillian Quince  Merrily Brittle, MD;  Location: [facility];  Service: Endoscopy;  Laterality: N/A;  . TONSILLECTOMY      Prior to Admission medications   Medication Sig Start Date End Date Taking? Authorizing Provider  amLODipine (NORVASC) 10 MG tablet Take 10 mg by mouth at bedtime.  06/16/15  Yes  Historical Provider, MD  hyoscyamine (LEVSIN) 0.125 MG tablet Take 1 to 2 tablets three times a day if needed for abdominal spasms 05/22/16  Yes [name], MD  levothyroxine (SYNTHROID, LEVOTHROID) 25 MCG tablet Take 25 mcg by mouth daily before breakfast.   Yes Historical Provider, MD  lisinopril (PRINIVIL,ZESTRIL) 5 MG tablet Take 5 mg by mouth at bedtime.    Yes Historical Provider, MD  Multiple Vitamins-Minerals (MULTIVITAMIN GUMMIES ADULT) CHEW Chew 1 tablet by mouth daily.   Yes Historical Provider, MD  polyethylene glycol powder (GLYCOLAX/MIRALAX) powder Take 17 g by mouth as needed for mild constipation.    Yes Historical Provider, MD  polyvinyl alcohol (LIQUIFILM TEARS) 1.4 % ophthalmic solution Place 1 drop into both eyes daily as needed for dry eyes.   Yes Historical Provider, MD  HYDROcodone-acetaminophen (NORCO/VICODIN) 5-325 MG tablet Take 2 tablets by mouth every 4 (four) hours as needed. Patient not taking: Reported on 11/23/2016 09/06/15   Noemi Chapel, MD    Current Facility-Administered Medications  Medication Dose Route Frequency Provider Last Rate Last Dose  . 0.9 %  sodium chloride infusion   Intravenous Continuous Kristen N Ward, DO 125 mL/hr at 11/24/16 0453    . amLODipine (NORVASC) tablet 10 mg  10 mg Oral QHS Ivor Costa, MD      . cefTRIAXone (ROCEPHIN) 1 g in dextrose 5 % 50 mL IVPB  1 g Intravenous Q24H Ivor Costa, MD      . hydrALAZINE (APRESOLINE) injection 5 mg  5 mg Intravenous Q2H PRN Ivor Costa, MD      . hyoscyamine (LEVSIN, ANASPAZ) tablet 0.125 mg  0.125 mg Oral TID PRN Ivor Costa, MD      . levothyroxine (SYNTHROID, LEVOTHROID) tablet 25 mcg  25 mcg Oral QAC breakfast Ivor Costa, MD      . lisinopril (PRINIVIL,ZESTRIL) tablet 5 mg  5 mg Oral QHS Ivor Costa, MD      . metoCLOPramide (REGLAN) injection 5 mg  5 mg Intravenous BID Ivor Costa, MD   5 mg at 11/24/16 0900  . morphine 2 MG/ML injection 1 mg  1 mg Intravenous Q4H PRN Ivor Costa, MD      . multivitamin  with minerals tablet 1 tablet  1 tablet Oral Daily Ivor Costa, MD      . ondansetron [facility]) injection 4 mg  4 mg Intravenous Q8H PRN Ivor Costa, MD      . pantoprazole (PROTONIX) injection 40 mg  40 mg Intravenous Daily Ivor Costa, MD      . polyethylene glycol (MIRALAX / GLYCOLAX) packet 17 g  17 g Oral PRN Ivor Costa, MD      . polyvinyl alcohol (LIQUIFILM TEARS) 1.4 % ophthalmic solution 1 drop  1 drop Both Eyes Daily PRN Ivor Costa, MD      . zolpidem (AMBIEN) tablet 5 mg  5 mg Oral QHS PRN Ivor Costa, MD        Allergies as of 11/23/2016 - Review Complete 11/23/2016  Allergen Reaction Noted  . Sulfamethoxazole Nausea Only 08/13/2015    Family History  Problem Relation Age of Onset  . Breast cancer Sister   . Heart disease  Father     Social History   Social History  . Marital status: Married    Spouse name: N/A  . Number of children: 2  . Years of education: N/A   Occupational History  . Retired    Social History Main Topics  . Smoking status: Never Smoker  . Smokeless tobacco: Never Used  . Alcohol use No     Comment: none since Thanksgiving  . Drug use: No  . Sexual activity: Not on file   Other Topics Concern  . Not on file   Social History Narrative  . No narrative on file    Review of Systems: All systems reviewed and negative except where noted in HPI.  Physical Exam: Vital signs in last 24 hours: Temp:  [98.1 F (36.7 C)-99 F (37.2 C)] 98.4 F (36.9 C) (04/10 0550) Pulse Rate:  [80-100] 80 (04/10 0550) Resp:  [14-18] 18 (04/10 0550) BP: (133-155)/(76-85) 146/76 (04/10 0550) SpO2:  [95 %-100 %] 97 % (04/10 0550) Weight:  [94 lb 12.8 oz (43 kg)-98 lb (44.5 kg)] 94 lb 12.8 oz (43 kg) (04/10 0550) Last BM Date: 11/24/16 General:   Alert, well-developed, white female in NAD Head:  Normocephalic and atraumatic. Eyes:  Sclera clear, no icterus.   Conjunctiva pink. Ears:  Normal auditory acuity. Nose:  No deformity, discharge,  or lesions. Mouth:  No  deformity or lesions.   Neck:  Supple; no masses Lungs:  Clear throughout to auscultation.   No wheezes, crackles, or rhonchi.  Heart:  Regular rate and rhythm; no murmurs, clicks, rubs,  or gallops. Abdomen:  Soft,nontender, BS active,no palp mass    Rectal:  Deferred  Msk:  Symmetrical without gross deformities. . Pulses:  Normal pulses noted. Extremities:  Without clubbing or edema. Neurologic:  Alert and  oriented x4;  grossly normal neurologically. Skin:  Intact without significant lesions or rashes.. Psych:  Alert and cooperative. Normal mood and affect.  Intake/Output from previous day: No intake/output data recorded. Intake/Output this shift: No intake/output data recorded.  Lab Results:  Recent Labs  11/23/16 2146  WBC 5.3  HGB 13.1  HCT 38.4  PLT 297   BMET  Recent Labs  11/23/16 2146  NA 133*  K 3.9  CL 94*  CO2 28  GLUCOSE 128*  BUN 13  CREATININE 0.75  CALCIUM 10.2   LFT  Recent Labs  11/23/16 2146  PROT 7.7  ALBUMIN 4.6  AST 35  ALT 33  ALKPHOS 121  BILITOT 0.9   PT/INR No results for input(s): LABPROT, INR in the last 72 hours. Hepatitis Panel No results for input(s): HEPBSAG, HCVAB, HEPAIGM, HEPBIGM in the last 72 hours.   Studies/Results: Ct Abdomen Pelvis W Contrast  Result Date: 11/24/2016 CLINICAL DATA:  Diffuse abdominal pain worsening over the last 2 weeks. Diarrhea earlier in the week now with nausea and vomiting for the past 24 hours. EXAM: CT ABDOMEN AND PELVIS WITH CONTRAST TECHNIQUE: Multidetector CT imaging of the abdomen and pelvis was performed using the standard protocol following bolus administration of intravenous contrast. CONTRAST:  80 cc Isovue-300 COMPARISON:  05/22/2016 FINDINGS: Lower chest: Normal size cardiac chambers. Small hiatal hernia. No pneumonic consolidation, effusion or pneumothorax. Hepatobiliary: Pneumobilia with biliary stenting and visualized. Some debris noted in the distal aspect of the stent as  before. No space-occupying mass of the liver. There is mild hepatic steatosis. Pancreas: Chronic mild ductal dilatation in the pancreatic head and body likely from mass effect the stent. No  space-occupying mass of the pancreas nor peripancreatic inflammation is noted. Spleen: Normal Adrenals/Urinary Tract: Stable punctate right upper pole renal calculus. Mild ectasia the right renal collecting system without obstructive uropathy, similar in appearance to prior. No space-occupying mass of kidneys. There is an upper pole left renal cyst measuring 4 mm and too small to further characterize. Urinary bladder is physiologically distended. Stomach/Bowel: Markedly distended fluid-filled stomach question gastroparesis. No significant small nor large bowel dilatation or obstruction. No bowel inflammation is noted. Diverticulosis is identified without acute diverticulitis. Moderate retention within left colon. Vascular/Lymphatic: Aortic atherosclerosis. No enlarged abdominal or pelvic lymph nodes. Reproductive: Subtle 1 cm hypodensity of the uterus on the right may reflect a small intramural fibroid. Calcification is seen anteriorly along the uterus. No adnexal mass. Other: No abdominal wall hernia or abnormality. No abdominopelvic ascites. Musculoskeletal: Levo scoliosis of the lumbar spine with lumbar spondylitic disc space narrowing from T12 through L3 and at L4-5 and L5-S1. Minimal grade 1 anterolisthesis L5 on S1. Broad-based disc bulges at L4-5 and L5-S1 with associated facet arthropathy. IMPRESSION: 1. Pneumobilia status post biliary stent. Minimal debris again noted in the distal aspect of the stent. No space-occupying mass of the liver. Hepatic steatosis. 2. Punctate right upper pole renal calculus without obstruction. Mild ectasia of the right renal collecting system as before, chronic in appearance. Small left renal cyst too small to further characterize at 4 mm in the upper pole. 3. Subtle hypodensity in the uterus  on the right may reflect a small intramural fibroid. 4. Marked gastric distention with fluid suspicious for gastroparesis. No small or large bowel obstruction or inflammation. 5. Spondylosis of the dorsal spine. Electronically Signed   By: Ashley Royalty M.D.   On: 11/24/2016 03:08     Tye Savoy, NP-C @  11/24/2016, 11:34 AM  Pager number [phone]  ________________________________________________________________________  Velora Heckler GI MD note:  I personally examined the patient, reviewed the data and agree with the assessment and plan described above.  I reviewed her CT scan with radiologist and they agree that duodenum is possibly narrowed, post bulbar. This is persistent throughout several sequences and concerning for partial GOO.  Importantly this is in the region of her biliary stricture.  I'm planning to proceed with EGD evaluation of the duodenum tomorrow.  OK to keep her on clears liquids today. NPO after MN.   Owens Loffler, MD [facility] Pager [phone]

## 2016-11-26 ENCOUNTER — Inpatient Hospital Stay (HOSPITAL_COMMUNITY): Payer: Medicare Other

## 2016-11-26 DIAGNOSIS — E039 Hypothyroidism, unspecified: Secondary | ICD-10-CM

## 2016-11-26 DIAGNOSIS — K222 Esophageal obstruction: Secondary | ICD-10-CM

## 2016-11-26 LAB — SURGICAL PCR SCREEN
MRSA, PCR: NEGATIVE
Staphylococcus aureus: NEGATIVE

## 2016-11-26 LAB — BASIC METABOLIC PANEL
Anion gap: 5 (ref 5–15)
CALCIUM: 8.3 mg/dL — AB (ref 8.9–10.3)
CO2: 27 mmol/L (ref 22–32)
CREATININE: 0.49 mg/dL (ref 0.44–1.00)
Chloride: 109 mmol/L (ref 101–111)
GFR calc Af Amer: >60 mL/min (ref >60)
Glucose, Bld: 98 mg/dL (ref 65–99)
Potassium: 3.1 mmol/L — ABNORMAL LOW (ref 3.5–5.1)
SODIUM: 141 mmol/L (ref 135–145)

## 2016-11-26 LAB — CBC
HCT: 30.4 % — ABNORMAL LOW (ref 36.0–46.0)
Hemoglobin: 10.4 g/dL — ABNORMAL LOW (ref 12.0–15.0)
MCH: 31.1 pg (ref 26.0–34.0)
MCHC: 34.2 g/dL (ref 30.0–36.0)
MCV: 91 fL (ref 78.0–100.0)
PLATELETS: 253 10*3/uL (ref 150–400)
RBC: 3.34 MIL/uL — AB (ref 3.87–5.11)
RDW: 13.6 % (ref 11.5–15.5)
WBC: 4.6 10*3/uL (ref 4.0–10.5)

## 2016-11-26 LAB — GLUCOSE, CAPILLARY: Glucose-Capillary: 94 mg/dL (ref 65–99)

## 2016-11-26 MED ORDER — DEXTROSE 5 % IV SOLN
2.0000 g | INTRAVENOUS | Status: AC
  Filled 2016-11-26: qty 2

## 2016-11-26 MED ORDER — CEFAZOLIN SODIUM-DEXTROSE 2-4 GM/100ML-% IV SOLN: 2.0000 g | INTRAVENOUS | Status: DC

## 2016-11-26 NOTE — Consult Note (Signed)
[facility] Surgery Consult Note  Summer Jones 12/01/1929  [id number].    Requesting MD: Charlies Silvers, MD Chief Complaint/Reason for Consult: duodenal stricture/GOO  HPI:  Summer Jones is a 83 year-old female with a history of bile duct stricture s/p metal stenting (followed by [name], MD) with no known malignancy who presented to the emergency department with worsening abdominal pain and vomiting. She has spasmodic abdominal pain at baseline but reports the pain has been worse for two weeks. 48 hours ago the cramping pain became intolerable and she was unable to keep any food down. Past surgeries include inguinal hernia repair by Dr. Zella Richer, no other abdominal surgeries. She denies use of blood thinning meds. Former smoker who quit > 60 years ago. Lives at home with her husband. Denies history of MI, CVA, or diabetes.   ED workup significant for CT scan revealing gastric distention, no masses or bilary ductal dilatation. Lipase 496. CBC and CMET WNL. She underwent endoscopy 4/11 showing "edematous stenosis of the distal duodenal bulb, this is in close proximity the known biliary stricture that presented 1 1/2 years ago with painless jaundice. 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 General surgery asked to consult.   ROS: Review of Systems  Constitutional: Negative for chills and fever.  Gastrointestinal: Positive for abdominal pain, nausea and vomiting.  All other systems reviewed and are negative.   Family History  Problem Relation Age of Onset  . Breast cancer Sister   . Heart disease Father     Past Medical History:  Diagnosis Date  . Arthritis   . Common bile duct (CBD) obstruction   . Difficult intubation Anterior glottis, short TMD, limited mouth opening. Required  glidescope and bougie stylet with multiple attempts.  . Diverticular disease   . Diverticulosis   . Essential hypertension   . Hypertension   . Hypothyroidism   . Jaundice   . Mitral valve prolapse   . Thyroid disease     Past Surgical History:  Procedure Laterality Date  . BREAST BIOPSY    . BREAST CYST EXCISION    . ENDOSCOPIC RETROGRADE CHOLANGIOPANCREATOGRAPHY (ERCP) WITH PROPOFOL N/A 08/08/2015   Procedure: ENDOSCOPIC RETROGRADE CHOLANGIOPANCREATOGRAPHY (ERCP) WITH PROPOFOL;  Surgeon: [name], MD;  Location: [facility]OSCOPY;  Service: Endoscopy;  Laterality: N/A;  . 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 EUS N/A 08/08/2015   Procedure: UPPER ENDOSCOPIC ULTRASOUND (EUS) RADIAL;  Surgeon: [name], MD;  Location: [facility] ENDOSCOPY;  Service: Endoscopy;  Laterality: N/A;  . INGUINAL HERNIA REPAIR     right  . SPYGLASS CHOLANGIOSCOPY N/A 12/26/2015   Procedure: HWEXHBZJ CHOLANGIOSCOPY;  Surgeon: [name], MD;  Location: [facility];  Service: Endoscopy;  Laterality: N/A;  . TONSILLECTOMY      Social History:  reports that she has never smoked. She has never used smokeless tobacco. She reports that she does not drink alcohol or use drugs.  Allergies:  Allergies  Allergen Reactions  . Sulfamethoxazole Nausea Only    Medications Prior to Admission  Medication Sig Dispense Refill  . amLODipine (NORVASC) 10 MG tablet Take  10 mg by mouth at bedtime.     . hyoscyamine (LEVSIN) 0.125 MG tablet Take 1 to 2 tablets three times a day if needed for abdominal spasms 30 tablet 1  . levothyroxine (SYNTHROID, LEVOTHROID) 25 MCG tablet Take 25 mcg by mouth daily before breakfast.    . lisinopril (PRINIVIL,ZESTRIL) 5 MG  tablet Take 5 mg by mouth at bedtime.     . Multiple Vitamins-Minerals (MULTIVITAMIN GUMMIES ADULT) CHEW Chew 1 tablet by mouth daily.    . polyethylene glycol powder (GLYCOLAX/MIRALAX) powder Take 17 g by mouth as needed for mild constipation.     . polyvinyl alcohol (LIQUIFILM TEARS) 1.4 % ophthalmic solution Place 1 drop into both eyes daily as needed for dry eyes.    Marland Kitchen HYDROcodone-acetaminophen (NORCO/VICODIN) 5-325 MG tablet Take 2 tablets by mouth every 4 (four) hours as needed. (Patient not taking: Reported on 11/23/2016) 10 tablet 0    Blood pressure 118/68, pulse 65, temperature 98.3 F (36.8 C), temperature source Oral, resp. rate 16, height _0  (1.499 m), weight 43 kg (94 lb 12.8 oz), SpO2 98 %. Physical Exam: Physical Exam  Constitutional: She is oriented to person, place, and time. She appears well-developed and well-nourished. No distress.  HENT:  Head: Normocephalic and atraumatic.  Right Ear: External ear normal.  Left Ear: External ear normal.  Eyes: Pupils are equal, round, and reactive to light. No scleral icterus.  Neck: Normal range of motion. Neck supple. No tracheal deviation present. No thyromegaly present.  Cardiovascular: Normal rate, regular rhythm, normal heart sounds and intact distal pulses.   Pulmonary/Chest: Effort normal and breath sounds normal. No respiratory distress. She has no wheezes. She has no rales. She exhibits no tenderness.  Abdominal: Soft. Bowel sounds are normal. She exhibits distension (mild). She exhibits no mass. There is no tenderness. No hernia.  Musculoskeletal: She exhibits no edema or deformity.  Neurological: She is alert and oriented to person, place, and time. No sensory deficit.  Skin: Skin is warm and dry. No erythema. No pallor.  Psychiatric: She has a normal mood and affect.   Results for orders placed or performed during the hospital encounter of 11/23/16 (from the past 48 hour(s))  Comprehensive metabolic panel     Status:  Abnormal   Collection Time: 11/25/16  3:38 AM  Result Value Ref Range   Sodium 134 (L) 135 - 145 mmol/L   Potassium 3.5 3.5 - 5.1 mmol/L   Chloride 107 101 - 111 mmol/L   CO2 22 22 - 32 mmol/L   Glucose, Bld 110 (H) 65 - 99 mg/dL   BUN 7 6 - 20 mg/dL   Creatinine, Ser 0.49 0.44 - 1.00 mg/dL   Calcium 8.2 (L) 8.9 - 10.3 mg/dL   Total Protein 5.5 (L) 6.5 - 8.1 g/dL   Albumin 3.1 (L) 3.5 - 5.0 g/dL   AST 31 15 - 41 U/L   ALT 22 14 - 54 U/L   Alkaline Phosphatase 78 38 - 126 U/L   Total Bilirubin 0.9 0.3 - 1.2 mg/dL   GFR calc non Af Amer >60 >60 mL/min   GFR calc Af Amer >60 >60 mL/min    Comment: (NOTE) The eGFR has been calculated using the CKD EPI equation. This calculation has not been validated in all clinical situations. eGFR's persistently <60 mL/min signify possible Chronic Kidney Disease.    Anion gap 5 5 - 15  CBC     Status: Abnormal   Collection Time: 11/25/16  3:38 AM  Result Value Ref Range   WBC 4.5 4.0 - 10.5 K/uL   RBC 3.41 (L) 3.87 - 5.11 MIL/uL   Hemoglobin 10.7 (L) 12.0 - 15.0 g/dL   HCT 31.2 (L) 36.0 - 46.0 %   MCV 91.5 78.0 - 100.0 fL   MCH 31.4 26.0 - 34.0 pg   MCHC 34.3 30.0 - 36.0 g/dL   RDW 13.8 11.5 - 15.5 %   Platelets 245 150 - 400 K/uL  Lipase, blood     Status: Abnormal   Collection Time: 11/25/16  3:38 AM  Result Value Ref Range   Lipase 273 (H) 11 - 51 U/L  Glucose, capillary     Status: Abnormal   Collection Time: 11/25/16  7:54 AM  Result Value Ref Range   Glucose-Capillary 109 (H) 65 - 99 mg/dL   Comment 1 Notify RN    Comment 2 Document in Chart   CBC     Status: Abnormal   Collection Time: 11/26/16  4:20 AM  Result Value Ref Range   WBC 4.6 4.0 - 10.5 K/uL   RBC 3.34 (L) 3.87 - 5.11 MIL/uL   Hemoglobin 10.4 (L) 12.0 - 15.0 g/dL   HCT 30.4 (L) 36.0 - 46.0 %   MCV 91.0 78.0 - 100.0 fL   MCH 31.1 26.0 - 34.0 pg   MCHC 34.2 30.0 - 36.0 g/dL   RDW 13.6 11.5 - 15.5 %   Platelets 253 150 - 400 K/uL  Basic metabolic panel      Status: Abnormal   Collection Time: 11/26/16  4:20 AM  Result Value Ref Range   Sodium 141 135 - 145 mmol/L    Comment: RESULT REPEATED AND VERIFIED DELTA CHECK NOTED    Potassium 3.1 (L) 3.5 - 5.1 mmol/L   Chloride 109 101 - 111 mmol/L   CO2 27 22 - 32 mmol/L   Glucose, Bld 98 65 - 99 mg/dL   BUN <5 (L) 6 - 20 mg/dL   Creatinine, Ser 0.49 0.44 - 1.00 mg/dL   Calcium 8.3 (L) 8.9 - 10.3 mg/dL   GFR calc non Af Amer >60 >60 mL/min   GFR calc Af Amer >60 >60 mL/min    Comment: (NOTE) The eGFR has been calculated using the CKD EPI equation. This calculation has not been validated in all clinical situations. eGFR's persistently <60 mL/min signify possible Chronic Kidney Disease.    Anion gap 5 5 - 15  Glucose, capillary     Status: None   Collection Time: 11/26/16  7:43 AM  Result Value Ref Range   Glucose-Capillary 94 65 - 99 mg/dL   Dg Ugi W/high Density W/kub  Result Date: 11/26/2016 CLINICAL DATA:  Duodenal stricture. Assess for gastric outlet obstruction. EXAM: UPPER GI SERIES WITH KUB TECHNIQUE: After obtaining a scout radiograph a routine upper GI series was performed using thin barium FLUOROSCOPY TIME:  Fluoroscopy Time:  4.8 minutes Radiation Exposure Index (if provided by the fluoroscopic device): 39.4 mGy Number of Acquired Spot Images: 0 COMPARISON:  CT 11/24/2016 FINDINGS: Scout image of the abdomen shows nonobstructive bowel gas pattern. Metallic biliary stent is in place. Fluoroscopic evaluation of swallowing demonstrates mild mucosal ring in the distal esophagus. Otherwise no fixed stricture, fold thickening or mass. The stomach is elongated. There is prompt emptying of the stomach into a dilated duodenal bulb, with very slow emptying from the duodenal bulb into the decompressed second portion of the duodenum. Over time, very slow passage of contrast  from the dilated duodenal bulb into the decompressed second portion of the duodenum. Reflux of contrast into the biliary stent.  IMPRESSION: Significantly dilated duodenal bulb with slow passage of contrast into the decompressed second portion of the duodenum, possibly related to stricture. Electronically Signed   By: Rolm Baptise M.D.   On: 11/26/2016 10:39   Assessment/Plan duodenal stricture/partial GOO - with duodenal bulb dilatation. No appreciable liver, biliary, or pancreatic masses on imaging.  - discussed whipple procedure with the patient and she does not want a surgery that involved. Also discussed a palliative gastrojejunostomy, which she is agreeable to.   Abdominal pain - improved since admission  Biliary stricture - metal stent in place Constipation - on miralax per GI  UTI- abx per medicine   Plan: suspect that without surgical intervention the patient will have recurrence of symptoms/worsening GOO. Recommend open gastrojejunostomy by Dr. Leighton Ruff; likely tomorrow, will make NPO after MN  I have discussed the procedure and recovery with the patient and her sons, Dr. [name] will see the patient this afternoon/evening to answer further questions and confirm plan of care.    Jill Alexanders, [facility] Surgery 11/26/2016, 2:10 PM Pager: [phone] Consults: [phone] Mon-Fri 7:00 am-4:30 pm Sat-Sun 7:00 am-11:30 am

## 2016-11-26 NOTE — Progress Notes (Signed)
Patient ID: Summer Jones, female   DOB: [birth year], 83 y.o.   MRN: [id number]  PROGRESS NOTE    Summer Jones  LEX:[id number] DOB: [birth year] DOA: 11/23/2016  PCP: [name], MD   Brief Narrative:  83 year old female with medical history significant for CBD obstruction status post stent, hypertension, hypothyroidism who presented to [facility] long with abdominal pain, nausea and vomiting. Over the past 2 weeks her abdominal pain became worse and more frequent located in the mid abdomen and left and right side. Pain is about 7 out of 10 in intensity, constant. Patient reported vomiting each time she eats or drinks. No fevers, no diarrhea. Pt has been seen by GI, Dr. [name]. Per Dr. Audelia Acton note on 09/15/16, pt had vague, masslike area in the head of the pancreas. Cytology andpathology with forcep biopsies through interventional radiology on 3 separate occasions have never shown clear malignancy,but a few 'atypical cells' were noted.  On admission, patient was hemodynamically stable. Lipase was 496, normal LFTs. There were moderate amount of leukocytes on urinalysis.CT abdomen and pelvis showed pneumobilia status post biliary stent with minimal debris in the distal aspect of the stent but no space occupying mass of the liver, right upper pole renal calculus without obstruction.  GI has seen the patient in consultation. Pt underwent EGD 4/11/218.   Assessment & Plan:   Principal Problem:   Abdominal pain concerning for partial gastric outlet obstruction peptic GE junction stricture  - Concern for narrowed duodenum, post bulbar and concerning for partial gastric outlet obstruction - EGD done 4/11 - mild peptic GE junction stricture, dilated with scope passage. Edematous stenosis of the distal duodenal bulb, this is in close proximity to the known biliary stricture that presented 1 1/2 years ago with painless jaundice. 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 Suspicionp that the biliary process is progressing and causing extrinsic compression of the duodenum, evolving gastric outlet obstruction - Per GI, ordered UGI barium  - Consulted general surgery as pt may benefit from gastrojejenostomy. - Continue nothing by mouth - Continue IV fluids  Active Problems:   UTI - Patient started on Rocephin empirically while awaiting urine culture results - Urine culture with multiple species non-predominant - Stopped rocephin 4/12    Hypothyroidism - Continue Synthroid 25 g daily    Essential hypertension - Continue Norvasc 10 mg daily and lisinopril 5 mg at bedtime   DVT prophylaxis: SCDs bilaterally  Code Status: full code  Family Communication: Husband at the bedside Disposition Plan: not yet stable for discharge    Consultants:   Gastroenterology, Dr. [name]  Procedures:   EGD 11/25/2016 - mild peptic GE junction stricture, dilated with scope passage. Edematous stenosis of the distal duodenal bulb, this is in close proximity to the known biliary stricture that presented 1 1/2 years ago with painless jaundice. 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 Suspicionp that the biliary process is progressing and causing extrinsic compression of the duodenum, evolving gastric outlet obstruction  Antimicrobials:   Rocephin 11/24/2016 --> 11/26/2016    Subjective: No overnight events  Objective: Vitals:   11/25/16 1535 11/25/16 1540 11/25/16 1545 11/26/16 0500  BP:   115/63 118/68  Pulse: 78 77 71 65  Resp: (!) 30 20 (!) 25 16  Temp:    98.3 F (36.8 C)  TempSrc:    Oral  SpO2: 99% 100% 100% 98%  Weight:    43 kg (94 lb 12.8 oz)  Height:        Intake/Output Summary (Last 24 hours) at 11/26/16 0936 Last data filed at 11/25/16  1515  Gross per 24 hour  Intake              200 ml  Output                0 ml  Net              200 ml   Filed Weights   11/23/16 2127 11/24/16 0550 11/26/16 0500  Weight: 44.5 kg (98 lb) 43 kg (94 lb 12.8 oz) 43 kg (94 lb 12.8 oz)    Examination:  General exam: Appears calm and comfortable, no distress  Respiratory system: No wheezing, no rhonchi  Cardiovascular system: S1 & S2 heard, Rate controlled  Gastrointestinal system: (+) BS, non tender   Central nervous system: No focal neurological deficits. Extremities: SNo swelling, palpable pulses  Skin: No rashes, lesions or ulcers, skin is warm and dry Psychiatry: Normal mood and behavior    Data Reviewed: I have personally reviewed following labs and imaging studies  CBC:  Recent Labs Lab 11/23/16 2146 11/25/16 0338 11/26/16 0420  WBC 5.3 4.5 4.6  HGB 13.1 10.7* 10.4*  HCT 38.4 31.2* 30.4*  MCV 90.8 91.5 91.0  PLT 297 245 867   Basic Metabolic Panel:  Recent Labs Lab 11/23/16 2146 11/25/16 0338 11/26/16 0420  NA 133* 134* 141  K 3.9 3.5 3.1*  CL 94* 107 109  CO2 28 22 27   GLUCOSE 128* 110* 98  BUN 13 7 <5*  CREATININE 0.75 0.49 0.49  CALCIUM 10.2 8.2* 8.3*   GFR: Estimated Creatinine Clearance: 35.5 mL/min (by C-G formula based on SCr of 0.49 mg/dL). Liver Function Tests:  Recent Labs Lab 11/23/16 2146 11/25/16 0338  AST 35 31  ALT 33 22  ALKPHOS 121 78  BILITOT 0.9 0.9  PROT 7.7 5.5*  ALBUMIN 4.6 3.1*    Recent Labs Lab 11/23/16 2146 11/25/16 0338  LIPASE 496* 273*   No results for input(s): AMMONIA in the last 168 hours. Coagulation Profile: No results for input(s): INR, PROTIME in the last 168 hours. Cardiac Enzymes: No results for input(s): CKTOTAL, CKMB, CKMBINDEX, TROPONINI in the last 168 hours. BNP (last 3 results) No results for input(s): PROBNP in the last 8760 hours. HbA1C: No results for input(s): HGBA1C in the last 72 hours. CBG:  Recent Labs Lab 11/24/16 0735  11/25/16 0754 11/26/16 0743  GLUCAP 118* 109* 94   Lipid Profile: No results for input(s): CHOL, HDL, LDLCALC, TRIG, CHOLHDL, LDLDIRECT in the last 72 hours. Thyroid Function Tests: No results for input(s): TSH, T4TOTAL, FREET4, T3FREE, THYROIDAB in the last 72 hours. Anemia Panel: No results for input(s): VITAMINB12, FOLATE, FERRITIN, TIBC, IRON, RETICCTPCT in the last 72 hours. Urine analysis:    Component Value Date/Time   COLORURINE YELLOW 11/23/2016 2133   APPEARANCEUR HAZY (A) 11/23/2016 2133   LABSPEC 1.020 11/23/2016 2133   PHURINE 5.0 11/23/2016 2133   GLUCOSEU NEGATIVE 11/23/2016 2133   HGBUR NEGATIVE 11/23/2016 2133   BILIRUBINUR NEGATIVE 11/23/2016 2133   KETONESUR 80 (A) 11/23/2016 2133   PROTEINUR 30 (A) 11/23/2016 2133   NITRITE NEGATIVE 11/23/2016 2133   LEUKOCYTESUR MODERATE (A) 11/23/2016 2133   Sepsis Labs: @LABRCNTIP (procalcitonin:4,lacticidven:4)  Recent Results (from the past 240 hour(s))  Urine culture     Status: Abnormal   Collection Time: 11/23/16 11:30 PM  Result Value Ref Range Status   Specimen Description URINE, CLEAN CATCH  Final   Special Requests NONE  Final   Culture MULTIPLE SPECIES PRESENT, SUGGEST RECOLLECTION (A)  Final   Report Status 11/25/2016 FINAL  Final      Radiology Studies: Ct Abdomen Pelvis W Contrast Result Date: 11/24/2016 1. Pneumobilia status post biliary stent. Minimal debris again noted in the distal aspect of the stent. No space-occupying mass of the liver. Hepatic steatosis. 2. Punctate right upper pole renal calculus without obstruction. Mild ectasia of the right renal collecting system as before, chronic in appearance. Small left renal cyst too small to further characterize at 4 mm in the upper pole. 3. Subtle hypodensity in the uterus on the right may reflect a small intramural fibroid. 4. Marked gastric distention with fluid suspicious for gastroparesis. No small or large bowel obstruction or inflammation. 5.  Spondylosis of the dorsal spine. Electronically Signed   By: Ashley Royalty M.D.   On: 11/24/2016 03:08     Scheduled Meds: . amLODipine  10 mg Oral QHS  . cefTRIAXone (ROCEPHIN)  IV  1 g Intravenous Q24H  . levothyroxine  25 mcg Oral QAC breakfast  . lisinopril  5 mg Oral QHS  . metoCLOPramide (REGLAN) injection  5 mg Intravenous BID  . multivitamin with minerals  1 tablet Oral Daily  . pantoprazole (PROTONIX) IV  40 mg Intravenous Daily  . polyethylene glycol  17 g Oral BID   Continuous Infusions: . sodium chloride 125 mL/hr at 11/25/16 1655     LOS: 2 days    Time spent: 25 minutes  Greater than 50% of the time spent on counseling and coordinating the care.   Leisa Lenz, MD [facility] Pager [phone]  If 7PM-7AM, please contact night-coverage [email or web address] Password [facility] 11/26/2016, 9:36 AM

## 2016-11-26 NOTE — Progress Notes (Signed)
    Progress Note   Subjective  Chief Complaint: Abdominal Pain, Nausea and vomiting  Pt s/p EGD yesterday with finding of possible GOO/duodenal stricture due to possible extrinsic compression? She reports feeling about the same today as yesterday, no further nausea or vomiting and abdominal pain continues to be decreased. She has been having BM which are sometimes urgent in nature after qd dosing of Miralax started 2 d ago. She is aware of scheduled UGI study today and does ask some questions about this. She denies any new complaints and overall feels "ok".    Objective   Vital signs in last 24 hours: Temp:  [97.8 F (36.6 C)-98.3 F (36.8 C)] 98.3 F (36.8 C) (04/12 0500) Pulse Rate:  [65-78] 65 (04/12 0500) Resp:  [16-35] 16 (04/12 0500) BP: (110-145)/(57-74) 118/68 (04/12 0500) SpO2:  [98 %-100 %] 98 % (04/12 0500) Weight:  [94 lb 12.8 oz (43 kg)] 94 lb 12.8 oz (43 kg) (04/12 0500) Last BM Date: 11/24/16 General: thin appearing elderly Caucasian female in NAD Heart:  Regular rate and rhythm; no murmurs Lungs: Respirations even and unlabored, lungs CTA bilaterally Abdomen:  Soft, nontender and mild distension, increased bowels sounds all 4 quads  Extremities:  Without edema. Neurologic:  Alert and oriented,  grossly normal neurologically. Psych:  Cooperative. Normal mood and affect.  Intake/Output from previous day: 04/11 0701 - 04/12 0700 In: 200 [I.V.:200] Out: -   Lab Results:  Recent Labs  11/23/16 2146 11/25/16 0338 11/26/16 0420  WBC 5.3 4.5 4.6  HGB 13.1 10.7* 10.4*  HCT 38.4 31.2* 30.4*  PLT 297 245 253   BMET  Recent Labs  11/23/16 2146 11/25/16 0338 11/26/16 0420  NA 133* 134* 141  K 3.9 3.5 3.1*  CL 94* 107 109  CO2 28 22 27   GLUCOSE 128* 110* 98  BUN 13 7 <5*  CREATININE 0.75 0.49 0.49  CALCIUM 10.2 8.2* 8.3*   LFT  Recent Labs  11/25/16 0338  PROT 5.5*  ALBUMIN 3.1*  AST 31  ALT 22  ALKPHOS 78  BILITOT 0.9    Assessment /  Plan:   Assessment: 1. Abdominal pain: thought secondary to possible duodenal stricture and GOO? Further eval today with Upper Gi series 2. Duodenal stricture: seen yesterday at time of EGD-suspected extrinsic compression? 3. Biliary stricture: pt decompressed with metal stent 4. Constipation:improved on Miralax BID  Plan: 1. Continue with Upper GI study as planned today to further evaluate possible GOO as suspected by Dr. [name] yesterday after time of EGD 2. Continue MiraLax, of note patient can take one dose per day if she develops diarrhea/urgency 3. clears 4. Please await any further recs from Dr. [name] later today  Thank you for your kind consultation, we will continue to follow along.   LOS: 2 days   Levin Erp  11/26/2016, 9:38 AM  Pager # [phone]   ________________________________________________________________________  Velora Heckler GI MD note:  I personally examined the patient, reviewed the data and agree with the assessment and plan described above.  Reviewed UGI report.  She is tolerating liquids very well.  Probably would tolerate full liquids, even puree consistency.  I'm concerned about evolving GOO and so I spoke with Dr. [name] yesterday about her situation, she will be meeting with Lelon Frohlich later today to discuss that.   Owens Loffler, MD [facility] Gastroenterology Pager [phone]

## 2016-11-27 ENCOUNTER — Inpatient Hospital Stay (HOSPITAL_COMMUNITY): Payer: Medicare Other | Admitting: Registered Nurse

## 2016-11-27 ENCOUNTER — Telehealth: Payer: Self-pay

## 2016-11-27 ENCOUNTER — Encounter (HOSPITAL_COMMUNITY)
Admission: EM | Disposition: A | Discharge status: Home / Self Care | Payer: Self-pay | Source: Home / Self Care | Attending: Internal Medicine

## 2016-11-27 ENCOUNTER — Encounter (HOSPITAL_COMMUNITY): Payer: Self-pay | Admitting: Registered Nurse

## 2016-11-27 DIAGNOSIS — K311 Adult hypertrophic pyloric stenosis: Principal | ICD-10-CM

## 2016-11-27 HISTORY — PX: PANCREAS BIOPSY: SHX1018

## 2016-11-27 HISTORY — PX: LAPAROTOMY: SHX154

## 2016-11-27 LAB — BASIC METABOLIC PANEL
ANION GAP: 7 (ref 5–15)
BUN: <5 mg/dL — ABNORMAL LOW (ref 6–20)
CALCIUM: 8.7 mg/dL — AB (ref 8.9–10.3)
CO2: 26 mmol/L (ref 22–32)
CREATININE: 0.54 mg/dL (ref 0.44–1.00)
Chloride: 107 mmol/L (ref 101–111)
GFR calc Af Amer: >60 mL/min (ref >60)
GLUCOSE: 103 mg/dL — AB (ref 65–99)
Potassium: 3.1 mmol/L — ABNORMAL LOW (ref 3.5–5.1)
Sodium: 140 mmol/L (ref 135–145)

## 2016-11-27 LAB — GLUCOSE, CAPILLARY: Glucose-Capillary: 94 mg/dL (ref 65–99)

## 2016-11-27 LAB — CBC
HCT: 34.2 % — ABNORMAL LOW (ref 36.0–46.0)
Hemoglobin: 11.7 g/dL — ABNORMAL LOW (ref 12.0–15.0)
MCH: 31 pg (ref 26.0–34.0)
MCHC: 34.2 g/dL (ref 30.0–36.0)
MCV: 90.5 fL (ref 78.0–100.0)
PLATELETS: 263 10*3/uL (ref 150–400)
RBC: 3.78 MIL/uL — ABNORMAL LOW (ref 3.87–5.11)
RDW: 13.7 % (ref 11.5–15.5)
WBC: 5.8 10*3/uL (ref 4.0–10.5)

## 2016-11-27 SURGERY — LAPAROTOMY, EXPLORATORY
Anesthesia: General

## 2016-11-27 MED ORDER — SUCCINYLCHOLINE CHLORIDE 200 MG/10ML IV SOSY
PREFILLED_SYRINGE | INTRAVENOUS | Status: AC
  Filled 2016-11-27: qty 10

## 2016-11-27 MED ORDER — EPHEDRINE 5 MG/ML INJ
INTRAVENOUS | Status: AC
  Filled 2016-11-27: qty 10

## 2016-11-27 MED ORDER — SODIUM CHLORIDE 0.9 % IV SOLN
INTRAVENOUS | Status: DC
Start: 2016-11-27 — End: 2016-11-30
  Administered 2016-11-27 – 2016-11-30 (×5): via INTRAVENOUS

## 2016-11-27 MED ORDER — ONDANSETRON HCL 4 MG/2ML IJ SOLN
INTRAMUSCULAR | Status: DC | PRN
  Administered 2016-11-27: 4 mg via INTRAVENOUS

## 2016-11-27 MED ORDER — LACTATED RINGERS IV SOLN
INTRAVENOUS | Status: DC | PRN
  Administered 2016-11-27 (×2): via INTRAVENOUS

## 2016-11-27 MED ORDER — SUCCINYLCHOLINE CHLORIDE 200 MG/10ML IV SOSY
PREFILLED_SYRINGE | INTRAVENOUS | Status: DC | PRN
  Administered 2016-11-27: 100 mg via INTRAVENOUS

## 2016-11-27 MED ORDER — METOCLOPRAMIDE HCL 5 MG/ML IJ SOLN
INTRAMUSCULAR | Status: AC
  Administered 2016-11-27: 5 mg via INTRAVENOUS
  Filled 2016-11-27: qty 2

## 2016-11-27 MED ORDER — FENTANYL CITRATE (PF) 100 MCG/2ML IJ SOLN
INTRAMUSCULAR | Status: DC | PRN
  Administered 2016-11-27: 100 ug via INTRAVENOUS

## 2016-11-27 MED ORDER — LIDOCAINE 2% (20 MG/ML) 5 ML SYRINGE
INTRAMUSCULAR | Status: DC | PRN
  Administered 2016-11-27: 100 mg via INTRAVENOUS

## 2016-11-27 MED ORDER — POTASSIUM CHLORIDE IN NACL 40-0.9 MEQ/L-% IV SOLN
INTRAVENOUS | Status: AC
  Administered 2016-11-27: 125 mL/h via INTRAVENOUS
  Filled 2016-11-27 (×2): qty 1000

## 2016-11-27 MED ORDER — CEFOTETAN DISODIUM-DEXTROSE 2-2.08 GM-% IV SOLR
INTRAVENOUS | Status: AC
  Filled 2016-11-27: qty 50

## 2016-11-27 MED ORDER — ONDANSETRON HCL 4 MG/2ML IJ SOLN
INTRAMUSCULAR | Status: AC
  Filled 2016-11-27: qty 2

## 2016-11-27 MED ORDER — FENTANYL CITRATE (PF) 100 MCG/2ML IJ SOLN
INTRAMUSCULAR | Status: AC
  Filled 2016-11-27: qty 2

## 2016-11-27 MED ORDER — PROPOFOL 10 MG/ML IV BOLUS
INTRAVENOUS | Status: AC
  Filled 2016-11-27: qty 20

## 2016-11-27 MED ORDER — ROCURONIUM BROMIDE 50 MG/5ML IV SOSY
PREFILLED_SYRINGE | INTRAVENOUS | Status: AC
  Filled 2016-11-27: qty 5

## 2016-11-27 MED ORDER — POTASSIUM CHLORIDE CRYS ER 20 MEQ PO TBCR
40.0000 meq | EXTENDED_RELEASE_TABLET | Freq: Once | ORAL | Status: DC
  Filled 2016-11-27: qty 2

## 2016-11-27 MED ORDER — SUGAMMADEX SODIUM 200 MG/2ML IV SOLN
INTRAVENOUS | Status: AC
  Filled 2016-11-27: qty 2

## 2016-11-27 MED ORDER — DEXAMETHASONE SODIUM PHOSPHATE 10 MG/ML IJ SOLN
INTRAMUSCULAR | Status: DC | PRN
  Administered 2016-11-27: 5 mg via INTRAVENOUS

## 2016-11-27 MED ORDER — DEXAMETHASONE SODIUM PHOSPHATE 10 MG/ML IJ SOLN
INTRAMUSCULAR | Status: AC
  Filled 2016-11-27: qty 1

## 2016-11-27 MED ORDER — LIDOCAINE 2% (20 MG/ML) 5 ML SYRINGE
INTRAMUSCULAR | Status: AC
  Filled 2016-11-27: qty 5

## 2016-11-27 MED ORDER — DEXTROSE 5 % IV SOLN
INTRAVENOUS | Status: DC | PRN
  Administered 2016-11-27: 2 g via INTRAVENOUS

## 2016-11-27 MED ORDER — PROPOFOL 10 MG/ML IV BOLUS
INTRAVENOUS | Status: DC | PRN
  Administered 2016-11-27: 120 mg via INTRAVENOUS

## 2016-11-27 MED ORDER — MORPHINE SULFATE (PF) 10 MG/ML IV SOLN
2.0000 mg | INTRAVENOUS | Status: DC | PRN
  Administered 2016-11-28: 2 mg via INTRAVENOUS
  Filled 2016-11-27: qty 1

## 2016-11-27 MED ORDER — METOCLOPRAMIDE HCL 5 MG/ML IJ SOLN
5.0000 mg | Freq: Once | INTRAMUSCULAR | Status: AC
  Administered 2016-11-27: 5 mg via INTRAVENOUS

## 2016-11-27 MED ORDER — FENTANYL CITRATE (PF) 100 MCG/2ML IJ SOLN
25.0000 ug | INTRAMUSCULAR | Status: DC | PRN
  Administered 2016-11-27 (×4): 25 ug via INTRAVENOUS

## 2016-11-27 MED ORDER — HYDROMORPHONE HCL 2 MG/ML IJ SOLN: 0.2500 mg | INTRAMUSCULAR | Status: DC | PRN

## 2016-11-27 MED ORDER — FENTANYL CITRATE (PF) 100 MCG/2ML IJ SOLN
INTRAMUSCULAR | Status: AC
  Administered 2016-11-27: 25 ug via INTRAVENOUS
  Filled 2016-11-27: qty 2

## 2016-11-27 MED ORDER — ROCURONIUM BROMIDE 10 MG/ML (PF) SYRINGE
PREFILLED_SYRINGE | INTRAVENOUS | Status: DC | PRN
  Administered 2016-11-27: 40 mg via INTRAVENOUS

## 2016-11-27 MED ORDER — SUGAMMADEX SODIUM 200 MG/2ML IV SOLN
INTRAVENOUS | Status: DC | PRN
  Administered 2016-11-27: 100 mg via INTRAVENOUS

## 2016-11-27 SURGICAL SUPPLY — 47 items
BLADE EXTENDED COATED 6.5IN (ELECTRODE) IMPLANT
CELLS DAT CNTRL 66122 CELL SVR (MISCELLANEOUS) IMPLANT
CHLORAPREP W/TINT 26ML (MISCELLANEOUS) ×4 IMPLANT
COUNTER NEEDLE 20 DBL MAG RED (NEEDLE) ×4 IMPLANT
COVER MAYO STAND STRL (DRAPES) ×8 IMPLANT
DERMABOND ADVANCED (GAUZE/BANDAGES/DRESSINGS) ×2
DERMABOND ADVANCED .7 DNX12 (GAUZE/BANDAGES/DRESSINGS) ×2 IMPLANT
DRAIN CHANNEL 19F RND (DRAIN) IMPLANT
DRAPE LAPAROSCOPIC ABDOMINAL (DRAPES) ×4 IMPLANT
DRAPE SHEET LG 3/4 BI-LAMINATE (DRAPES) IMPLANT
DRSG OPSITE POSTOP 4X10 (GAUZE/BANDAGES/DRESSINGS) IMPLANT
DRSG OPSITE POSTOP 4X6 (GAUZE/BANDAGES/DRESSINGS) ×4 IMPLANT
DRSG OPSITE POSTOP 4X8 (GAUZE/BANDAGES/DRESSINGS) IMPLANT
ELECT PENCIL ROCKER SW 15FT (MISCELLANEOUS) ×8 IMPLANT
ELECT REM PT RETURN 15FT ADLT (MISCELLANEOUS) ×4 IMPLANT
EVACUATOR SILICONE 100CC (DRAIN) IMPLANT
GAUZE SPONGE 4X4 12PLY STRL (GAUZE/BANDAGES/DRESSINGS) IMPLANT
GLOVE BIO SURGEON STRL SZ 6.5 (GLOVE) ×6 IMPLANT
GLOVE BIO SURGEONS STRL SZ 6.5 (GLOVE) ×2
GLOVE BIOGEL PI IND STRL 7.0 (GLOVE) ×4 IMPLANT
GLOVE BIOGEL PI INDICATOR 7.0 (GLOVE) ×4
GOWN STRL REUS W/TWL 2XL LVL3 (GOWN DISPOSABLE) ×8 IMPLANT
GOWN STRL REUS W/TWL XL LVL3 (GOWN DISPOSABLE) ×4 IMPLANT
HANDLE SUCTION POOLE (INSTRUMENTS) IMPLANT
LEGGING LITHOTOMY PAIR STRL (DRAPES) IMPLANT
PACK COLON (CUSTOM PROCEDURE TRAY) ×4 IMPLANT
RETRACTOR WND ALEXIS 25 LRG (MISCELLANEOUS) IMPLANT
RTRCTR WOUND ALEXIS 18CM MED (MISCELLANEOUS)
RTRCTR WOUND ALEXIS 25CM LRG (MISCELLANEOUS)
SEALER TISSUE X1 CVD JAW (INSTRUMENTS) IMPLANT
STAPLER GUN LINEAR PROX 60 (STAPLE) ×4 IMPLANT
STAPLER PROXIMATE 75MM BLUE (STAPLE) ×4 IMPLANT
STAPLER VISISTAT 35W (STAPLE) ×4 IMPLANT
SUCTION POOLE HANDLE (INSTRUMENTS)
SUT ETHILON 3 0 PS 1 (SUTURE) IMPLANT
SUT NOVA NAB GS-21 0 18 T12 DT (SUTURE) IMPLANT
SUT PDS AB 1 CTX 36 (SUTURE) IMPLANT
SUT SILK 2 0 (SUTURE) ×2
SUT SILK 2 0 SH CR/8 (SUTURE) ×4 IMPLANT
SUT SILK 2-0 18XBRD TIE 12 (SUTURE) ×2 IMPLANT
SUT SILK 3 0 (SUTURE) ×2
SUT SILK 3 0 SH CR/8 (SUTURE) ×4 IMPLANT
SUT SILK 3-0 18XBRD TIE 12 (SUTURE) ×2 IMPLANT
SUT VIC AB 2-0 SH 18 (SUTURE) ×4 IMPLANT
TOWEL OR 17X26 10 PK STRL BLUE (TOWEL DISPOSABLE) IMPLANT
TOWEL OR NON WOVEN STRL DISP B (DISPOSABLE) ×4 IMPLANT
TRAY FOLEY W/METER SILVER 16FR (SET/KITS/TRAYS/PACK) IMPLANT

## 2016-11-27 NOTE — Progress Notes (Signed)
GOO  Subjective: Feeling about the same today.    Objective: Vital signs in last 24 hours: Temp:  [97.6 F (36.4 C)-99.2 F (37.3 C)] 99.2 F (37.3 C) (04/13 0401) Pulse Rate:  [64-88] 74 (04/13 0401) Resp:  [16-18] 16 (04/13 0401) BP: (114-155)/(67-84) 114/67 (04/13 0401) SpO2:  [97 %-100 %] 97 % (04/13 0401) Last BM Date: 11/24/16  Intake/Output from previous day: 04/12 0701 - 04/13 0700 In: 2610 [P.O.:360; I.V.:2250] Out: -  Intake/Output this shift: No intake/output data recorded.  General appearance: alert and cooperative Resp: clear to auscultation bilaterally Cardio: regular rate and rhythm GI: soft, non-tender; bowel sounds normal; no masses,  no organomegaly  Lab Results:  Results for orders placed or performed during the hospital encounter of 11/23/16 (from the past 24 hour(s))  Surgical pcr screen     Status: None   Collection Time: 11/26/16  4:56 PM  Result Value Ref Range   MRSA, PCR NEGATIVE NEGATIVE   Staphylococcus aureus NEGATIVE NEGATIVE  CBC     Status: Abnormal   Collection Time: 11/27/16  4:09 AM  Result Value Ref Range   WBC 5.8 4.0 - 10.5 K/uL   RBC 3.78 (L) 3.87 - 5.11 MIL/uL   Hemoglobin 11.7 (L) 12.0 - 15.0 g/dL   HCT 34.2 (L) 36.0 - 46.0 %   MCV 90.5 78.0 - 100.0 fL   MCH 31.0 26.0 - 34.0 pg   MCHC 34.2 30.0 - 36.0 g/dL   RDW 13.7 11.5 - 15.5 %   Platelets 263 150 - 400 K/uL  Basic metabolic panel     Status: Abnormal   Collection Time: 11/27/16  4:09 AM  Result Value Ref Range   Sodium 140 135 - 145 mmol/L   Potassium 3.1 (L) 3.5 - 5.1 mmol/L   Chloride 107 101 - 111 mmol/L   CO2 26 22 - 32 mmol/L   Glucose, Bld 103 (H) 65 - 99 mg/dL   BUN <5 (L) 6 - 20 mg/dL   Creatinine, Ser 0.54 0.44 - 1.00 mg/dL   Calcium 8.7 (L) 8.9 - 10.3 mg/dL   GFR calc non Af Amer >60 >60 mL/min   GFR calc Af Amer >60 >60 mL/min   Anion gap 7 5 - 15  Glucose, capillary     Status: None   Collection Time: 11/27/16  8:00 AM  Result Value Ref Range   Glucose-Capillary 94 65 - 99 mg/dL     Studies/Results Radiology     MEDS, Scheduled . [MAR Hold] amLODipine  10 mg Oral QHS  . cefoTEtan in Dextrose 5%      . [MAR Hold] levothyroxine  25 mcg Oral QAC breakfast  . [MAR Hold] lisinopril  5 mg Oral QHS  . [MAR Hold] metoCLOPramide (REGLAN) injection  5 mg Intravenous BID  . [MAR Hold] multivitamin with minerals  1 tablet Oral Daily  . [MAR Hold] pantoprazole (PROTONIX) IV  40 mg Intravenous Daily  . [MAR Hold] polyethylene glycol  17 g Oral BID     Assessment: Abdominal pain GOO  Plan: Plan for GJ bypass today.  All questions were answered.     LOS: 3 days    Rosario Adie, [facility], [place]   11/27/2016 8:36 AM

## 2016-11-27 NOTE — Telephone Encounter (Signed)
-----   Message from [name], MD sent at 11/27/2016  2:52 PM EDT ----- She needs rov with me in 6-7 weeks.  Thanks

## 2016-11-27 NOTE — Anesthesia Preprocedure Evaluation (Addendum)
Anesthesia Evaluation  Patient identified by MRN, date of birth, ID band Patient awake    History of Anesthesia Complications (+) DIFFICULT AIRWAY and Family history of anesthesia reaction  Airway Mallampati: II  TM Distance: >3 FB     Dental   Pulmonary    breath sounds clear to auscultation       Cardiovascular hypertension,  Rhythm:Regular Rate:Normal     Neuro/Psych    GI/Hepatic Neg liver ROS, GI history noted. CG   Endo/Other  Hypothyroidism   Renal/GU      Musculoskeletal  (+) Arthritis ,   Abdominal   Peds  Hematology  (+) anemia ,   Anesthesia Other Findings   Reproductive/Obstetrics                             Anesthesia Physical Anesthesia Plan  ASA: III  Anesthesia Plan: General   Post-op Pain Management:    Induction: Intravenous  Airway Management Planned: Oral ETT  Additional Equipment:   Intra-op Plan:   Post-operative Plan: Possible Post-op intubation/ventilation  Informed Consent: I have reviewed the patients History and Physical, chart, labs and discussed the procedure including the risks, benefits and alternatives for the proposed anesthesia with the patient or authorized representative who has indicated his/her understanding and acceptance.   Dental advisory given  Plan Discussed with: CRNA and Anesthesiologist  Anesthesia Plan Comments:         Anesthesia Quick Evaluation

## 2016-11-27 NOTE — Progress Notes (Addendum)
Patient ID: Summer Jones, female   DOB: 1930/06/08, 83 y.o.   MRN: [id number]  PROGRESS NOTE    Summer Jones  XTG:[id number] DOB: 12/22/1929 DOA: 11/23/2016  PCP: [name], MD   Brief Narrative:  83 year old female with medical history significant for CBD obstruction status post stent, hypertension, hypothyroidism who presented to [facility] long with abdominal pain, nausea and vomiting. Over the past 2 weeks her abdominal pain became worse and more frequent located in the mid abdomen and left and right side. Pain is about 7 out of 10 in intensity, constant. Patient reported vomiting each time she eats or drinks. No fevers, no diarrhea. Pt has been seen by GI, Dr. [name]. Per Dr. Audelia Acton note on 09/15/16, pt had vague, masslike area in the head of the pancreas. Cytology andpathology with forcep biopsies through interventional radiology on 3 separate occasions have never shown clear malignancy,but a few 'atypical cells' were noted.  On admission, patient was hemodynamically stable. Lipase was 496, normal LFTs. There were moderate amount of leukocytes on urinalysis.CT abdomen and pelvis showed pneumobilia status post biliary stent with minimal debris in the distal aspect of the stent but no space occupying mass of the liver, right upper pole renal calculus without obstruction.  GI has seen the patient in consultation. Pt underwent EGD 4/11/218.   Assessment & Plan:   Principal Problem:   Abdominal pain concerning for partial gastric outlet obstruction peptic GE junction stricture  - Concern for narrowed duodenum, post bulbar and concerning for partial gastric outlet obstruction - EGD done 4/11 - mild peptic GE junction stricture, dilated with scope passage. Edematous stenosis of the distal duodenal bulb, this is in close proximity to the known biliary stricture that presented 1 1/2 years ago with painless jaundice. 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 Suspicionp that the biliary process is progressing and causing extrinsic compression of the duodenum, evolving gastric outlet obstruction - UGI barium - significantly dilated duodenal bulb with slow passage of contrast into the decompressed second portion of the duodenum, possibly related to stricture - Per surgery, plan for GJ bypass  - Continue IV fluids  Active Problems:   UTI - Patient started on Rocephin empirically while awaiting urine culture results - Urine culture with multiple species non-predominant - Stopped rocephin 4/12    Hypokalemia - Due to GI related issues - Supplement and follow up BMP in am    Hypothyroidism - Continue Synthroid 25 g daily    Essential hypertension - Continue Norvasc 10 mg daily and lisinopril 5 mg at bedtime   DVT prophylaxis: SCDs bilaterally  Code Status: full code  Family Communication: Husband at the bedside Disposition Plan: plan for surgery today    Consultants:   Gastroenterology, Dr. [name]  Procedures:   EGD 11/25/2016 - mild peptic GE junction stricture, dilated with scope passage. Edematous stenosis of the distal duodenal bulb, this is in close proximity to the known biliary stricture that presented 1 1/2 years ago with painless jaundice. 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 Suspicionp that the biliary process is progressing and causing extrinsic compression of the duodenum, evolving gastric outlet obstruction  Antimicrobials:   Rocephin 11/24/2016 --> 11/26/2016    Subjective: No overnight events  Objective: Vitals:  11/26/16 0500 11/26/16 1508 11/26/16 2036 11/27/16 0401  BP: 118/68 (!) 155/84 136/76 114/67  Pulse: 65 88 64 74  Resp: 16 16 18 16   Temp: 98.3 F (36.8 C) 98 F (36.7 C) 97.6 F (36.4 C)  99.2 F (37.3 C)  TempSrc: Oral Oral Oral Oral  SpO2: 98% 100% 98% 97%  Weight: 43 kg (94 lb 12.8 oz)     Height:        Intake/Output Summary (Last 24 hours) at 11/27/16 0928 Last data filed at 11/27/16 0300  Gross per 24 hour  Intake             2610 ml  Output                0 ml  Net             2610 ml   Filed Weights   11/23/16 2127 11/24/16 0550 11/26/16 0500  Weight: 44.5 kg (98 lb) 43 kg (94 lb 12.8 oz) 43 kg (94 lb 12.8 oz)    Examination:  General exam: no distress  Respiratory system: No wheezing, no rhonchi  Cardiovascular system: S1 & S2 heard, RRR Gastrointestinal system: (+) BS, non tender, no distention   Central nervous system: Nonfocal Extremities: No swelling, palpable pulses bilaterally  Skin: warm and dry Psychiatry: Normal mood and behavior, no restlessness   Data Reviewed: I have personally reviewed following labs and imaging studies  CBC:  Recent Labs Lab 11/23/16 2146 11/25/16 0338 11/26/16 0420 11/27/16 0409  WBC 5.3 4.5 4.6 5.8  HGB 13.1 10.7* 10.4* 11.7*  HCT 38.4 31.2* 30.4* 34.2*  MCV 90.8 91.5 91.0 90.5  PLT 297 245 253 762   Basic Metabolic Panel:  Recent Labs Lab 11/23/16 2146 11/25/16 0338 11/26/16 0420 11/27/16 0409  NA 133* 134* 141 140  K 3.9 3.5 3.1* 3.1*  CL 94* 107 109 107  CO2 28 22 27 26   GLUCOSE 128* 110* 98 103*  BUN 13 7 <5* <5*  CREATININE 0.75 0.49 0.49 0.54  CALCIUM 10.2 8.2* 8.3* 8.7*   GFR: Estimated Creatinine Clearance: 35.5 mL/min (by C-G formula based on SCr of 0.54 mg/dL). Liver Function Tests:  Recent Labs Lab 11/23/16 2146 11/25/16 0338  AST 35 31  ALT 33 22  ALKPHOS 121 78  BILITOT 0.9 0.9  PROT 7.7 5.5*  ALBUMIN 4.6 3.1*    Recent Labs Lab 11/23/16 2146 11/25/16 0338  LIPASE 496* 273*   No results for input(s): AMMONIA in the last 168 hours. Coagulation Profile: No results for input(s): INR, PROTIME in the last 168 hours. Cardiac Enzymes: No results for input(s):  CKTOTAL, CKMB, CKMBINDEX, TROPONINI in the last 168 hours. BNP (last 3 results) No results for input(s): PROBNP in the last 8760 hours. HbA1C: No results for input(s): HGBA1C in the last 72 hours. CBG:  Recent Labs Lab 11/24/16 0735 11/25/16 0754 11/26/16 0743 11/27/16 0800  GLUCAP 118* 109* 94 94   Lipid Profile: No results for input(s): CHOL, HDL, LDLCALC, TRIG, CHOLHDL, LDLDIRECT in the last 72 hours. Thyroid Function Tests: No results for input(s): TSH, T4TOTAL, FREET4, T3FREE, THYROIDAB in the last 72 hours. Anemia Panel: No results for input(s): VITAMINB12, FOLATE, FERRITIN, TIBC, IRON, RETICCTPCT in the last 72 hours. Urine analysis:    Component Value Date/Time   COLORURINE YELLOW 11/23/2016 2133   APPEARANCEUR HAZY (A) 11/23/2016 2133   LABSPEC 1.020 11/23/2016 2133   PHURINE 5.0 11/23/2016 2133   GLUCOSEU NEGATIVE 11/23/2016 2133  HGBUR NEGATIVE 11/23/2016 2133   BILIRUBINUR NEGATIVE 11/23/2016 2133   KETONESUR 80 (A) 11/23/2016 2133   PROTEINUR 30 (A) 11/23/2016 2133   NITRITE NEGATIVE 11/23/2016 2133   LEUKOCYTESUR MODERATE (A) 11/23/2016 2133   Sepsis Labs: @LABRCNTIP (procalcitonin:4,lacticidven:4)  Urine culture     Status: Abnormal   Collection Time: 11/23/16 11:30 PM  Result Value Ref Range Status   Specimen Description URINE, CLEAN CATCH  Final   Special Requests NONE  Final   Culture MULTIPLE SPECIES PRESENT, SUGGEST RECOLLECTION (A)  Final   Report Status 11/25/2016 FINAL  Final  Surgical pcr screen     Status: None   Collection Time: 11/26/16  4:56 PM  Result Value Ref Range Status   MRSA, PCR NEGATIVE NEGATIVE Final   Staphylococcus aureus NEGATIVE NEGATIVE Final      Radiology Studies: Ct Abdomen Pelvis W Contrast Result Date: 11/24/2016 1. Pneumobilia status post biliary stent. Minimal debris again noted in the distal aspect of the stent. No space-occupying mass of the liver. Hepatic steatosis. 2. Punctate right upper pole renal calculus  without obstruction. Mild ectasia of the right renal collecting system as before, chronic in appearance. Small left renal cyst too small to further characterize at 4 mm in the upper pole. 3. Subtle hypodensity in the uterus on the right may reflect a small intramural fibroid. 4. Marked gastric distention with fluid suspicious for gastroparesis. No small or large bowel obstruction or inflammation. 5. Spondylosis of the dorsal spine. Electronically Signed   By: Ashley Royalty M.D.   On: 11/24/2016 03:08   Dg Ugi W/high Density W/kub Result Date: 11/26/2016 Significantly dilated duodenal bulb with slow passage of contrast into the decompressed second portion of the duodenum, possibly related to stricture. Electronically Signed   By: Rolm Baptise M.D.   On: 11/26/2016 10:39     Scheduled Meds: . [MAR Hold] amLODipine  10 mg Oral QHS  . cefoTEtan in Dextrose 5%      . [MAR Hold] levothyroxine  25 mcg Oral QAC breakfast  . [MAR Hold] lisinopril  5 mg Oral QHS  . [MAR Hold] metoCLOPramide (REGLAN) injection  5 mg Intravenous BID  . [MAR Hold] multivitamin with minerals  1 tablet Oral Daily  . [MAR Hold] pantoprazole (PROTONIX) IV  40 mg Intravenous Daily  . [MAR Hold] polyethylene glycol  17 g Oral BID   Continuous Infusions: . sodium chloride 1,000 mL (11/26/16 1844)     LOS: 3 days    Time spent: 25 minutes  Greater than 50% of the time spent on counseling and coordinating the care.   Leisa Lenz, MD [facility] Pager [phone]  If 7PM-7AM, please contact night-coverage [email or web address] Password [facility] 11/27/2016, 9:28 AM

## 2016-11-27 NOTE — Op Note (Signed)
11/23/2016 - 11/27/2016  9:47 AM  PATIENT:  Summer Jones  83 y.o. female  Patient Care Team: Crist Infante, MD as PCP - General (Internal Medicine)  PRE-OPERATIVE DIAGNOSIS:  Gastric outlet obstruction  POST-OPERATIVE DIAGNOSIS:  Gastric outlet obstruction  PROCEDURE:   GASTROJEJUNOSTOMY, BIOPSY PANCREATIC HEAD MASS   Surgeon(s): Leighton Ruff, MD  ASSISTANT: none   ANESTHESIA:   general  EBL:  Total I/O In: 1000 [I.V.:1000] Out: 25 [Blood:25]  DRAINS: none   SPECIMEN:  Source of Specimen:  core needle biopsy of pancreatic head mass  DISPOSITION OF SPECIMEN:  PATHOLOGY  COUNTS:  YES  PLAN OF CARE: inpatient  PATIENT DISPOSITION:  PACU - hemodynamically stable.  INDICATION: 83 y.o.    OR FINDINGS: Gastric L and obstruction at the second portion of the duodenum from a posterior mass, presumably pancreatic head  DESCRIPTION: the patient was identified in the preoperative holding area and taken to the OR where they were laid supine on the operating room table.  General anesthesia was induced without difficulty. SCDs were also noted to be in place prior to the initiation of anesthesia.  The patient was then prepped and draped in the usual sterile fashion.   A surgical timeout was performed indicating the correct patient, procedure, positioning and need for preoperative antibiotics.   I began by making a small supraumbilical incision at midline using a 10 blade scalpel. Dissection was carried down through the subcutaneous tissues and the fascia was elevated with Kocher clamps. The fascia was divided and the peritoneum was entered bluntly. I identified the stomach and extended my incision upward. I identified the GE junction and palpated a mass posterior to the duodenum that appeared to be the source of obstruction. I opened up the lesser sac and clear the posterior wall of the stomach. I then identified the ligament of Treitz and brought out small bowel distal to this. I created  a gastrojejunostomy with this portion of jejunum to the back wall of the stomach using a 75 mm GIA stapler. The NG was then passed to the base of the stomach and secured into place with the tip of the tube just proximal to the anastomosis.  The common enterotomy channel was then closed with a 60 mm TA stapler. The corners of the staple line were oversewn using interrupted 3-0 silk sutures. An anti-tension suture was placed as well using a 3-0 silk suture. This was then placed back into the abdomen.  I then elevated the pylorus out of the abdomen to gain access to the mass. I was able to pass a core needle through the most medial portion of the mass. On the third pass I was able to get a small amount of tissue for biopsy. There was no overt bleeding. The core needle site was closed using a figure-of-eight 3-0 silk suture. Hemostasis was good. This was then placed back into the abdomen. The fascia was reapproximated using interrupted 0 Novafil sutures. The subcutaneous tissue was closed using interrupted 2-0 Vicryl sutures. The skin was closed using a running 4-0 Vicryl suture. A sterile dressing was applied. The patient tolerated the procedure well. All counts were correct per operating room staff.

## 2016-11-27 NOTE — Transfer of Care (Signed)
Immediate Anesthesia Transfer of Care Note  Patient: Summer Jones  Procedure(s) Performed: Procedure(s): GASTROJEJUNOSTOMY (N/A) PANCREACTIC BIOPSY  Patient Location: PACU  Anesthesia Type:General  Level of Consciousness:  sedated, patient cooperative and responds to stimulation  Airway & Oxygen Therapy:Patient Spontanous Breathing and Patient connected to face mask oxgen  Post-op Assessment:  Report given to PACU RN and Post -op Vital signs reviewed and stable  Post vital signs:  Reviewed and stable  Last Vitals:  Vitals:   11/26/16 2036 11/27/16 0401  BP: 136/76 114/67  Pulse: 64 74  Resp: 18 16  Temp: 36.4 C 58.4 C    Complications: No apparent anesthesia complications

## 2016-11-27 NOTE — Progress Notes (Signed)
Pt doing well after surgery today.  Having some nausea.  NG functioning.  Pain controlled.  We discussed the results of the surgery with her and her family.  Path is pending.     Rosario Adie, MD  [facility]

## 2016-11-27 NOTE — Telephone Encounter (Signed)
The pt will be given appt info at discharge.

## 2016-11-27 NOTE — Anesthesia Postprocedure Evaluation (Signed)
Anesthesia Post Note  Patient: Summer Jones  Procedure(s) Performed: Procedure(s) (LRB): GASTROJEJUNOSTOMY (N/A) PANCREACTIC BIOPSY  Patient location during evaluation: PACU Anesthesia Type: General Level of consciousness: awake Pain management: pain level controlled Vital Signs Assessment: post-procedure vital signs reviewed and stable Respiratory status: spontaneous breathing Cardiovascular status: stable Anesthetic complications: no       Last Vitals:  Vitals:   11/27/16 1100 11/27/16 1115  BP: 134/64 136/78  Pulse: 80 86  Resp: 18 19  Temp:  36.4 C    Last Pain:  Vitals:   11/27/16 1045  TempSrc:   PainSc: Asleep                 Azura Tufaro

## 2016-11-27 NOTE — Telephone Encounter (Signed)
01/12/17 145 pm

## 2016-11-27 NOTE — Progress Notes (Signed)
[facility] Progress Note    Since last GI note:  S/p gastrojejunostomy to bypass the duodenal stenosis, also needle biopsy of pancreatic mass.  She's recovering well in room, family around. NG in place  Objective: Vital signs in last 24 hours: Temp:  [97.5 F (36.4 C)-99.2 F (37.3 C)] 97.7 F (36.5 C) (04/13 1428) Pulse Rate:  [64-88] 82 (04/13 1428) Resp:  [16-20] 19 (04/13 1428) BP: (96-173)/(64-156) 138/81 (04/13 1428) SpO2:  [97 %-100 %] 100 % (04/13 1428) Last BM Date: 11/24/16 General: alert and oriented times 3   Lab Results:  Recent Labs  11/25/16 0338 11/26/16 0420 11/27/16 0409  WBC 4.5 4.6 5.8  HGB 10.7* 10.4* 11.7*  PLT 245 253 263  MCV 91.5 91.0 90.5    Recent Labs  11/25/16 0338 11/26/16 0420 11/27/16 0409  NA 134* 141 140  K 3.5 3.1* 3.1*  CL 107 109 107  CO2 22 27 26   GLUCOSE 110* 98 103*  BUN 7 <5* <5*  CREATININE 0.49 0.49 0.54  CALCIUM 8.2* 8.3* 8.7*    Recent Labs  11/25/16 0338  PROT 5.5*  ALBUMIN 3.1*  AST 31  ALT 22  ALKPHOS 78  BILITOT 0.9    Medications: Scheduled Meds: . amLODipine  10 mg Oral QHS  . cefoTEtan in Dextrose 5%      . levothyroxine  25 mcg Oral QAC breakfast  . lisinopril  5 mg Oral QHS  . metoCLOPramide (REGLAN) injection  5 mg Intravenous BID  . multivitamin with minerals  1 tablet Oral Daily  . pantoprazole (PROTONIX) IV  40 mg Intravenous Daily  . polyethylene glycol  17 g Oral BID  . potassium chloride  40 mEq Oral Once   Continuous Infusions: . sodium chloride 1,000 mL (11/26/16 1844)   PRN Meds:.hydrALAZINE, hyoscyamine, morphine injection, ondansetron, polyethylene glycol, polyvinyl alcohol, zolpidem    Assessment/Plan: 83 y.o. female s/p gastro-jejunostomy and needle biopsy of pancreatic mass  Appreciate surgical help with her.  I'm very interested to see what the pancreatic biopsy shows. She presented with obstructive jaundice about 1 1/2 years ago and we've never seen  clear mass on multiple imaging studies and several biopsies of pancreas, bile duct have never been diagnostic.    Post op care per surgery team.   I'll arrange follow up in my office in 6-7 weeks.   [name], MD  11/27/2016, 2:47 PM [facility] Pager [phone]

## 2016-11-27 NOTE — Anesthesia Procedure Notes (Signed)
Procedure Name: Intubation Date/Time: 11/27/2016 8:53 AM Performed by: Talbot Grumbling Pre-anesthesia Checklist: Patient identified, Emergency Drugs available, Suction available and Patient being monitored Patient Re-evaluated:Patient Re-evaluated prior to inductionOxygen Delivery Method: Circle system utilized Preoxygenation: Pre-oxygenation with 100% oxygen Intubation Type: IV induction Ventilation: Mask ventilation without difficulty Laryngoscope Size: Glidescope (Lo Pro 3) Grade View: Grade II Tube type: Oral Tube size: 6.5 mm Number of attempts: 1 Airway Equipment and Method: Stylet and Video-laryngoscopy Placement Confirmation: ETT inserted through vocal cords under direct vision,  positive ETCO2 and breath sounds checked- equal and bilateral Secured at: 21 cm Tube secured with: Tape Dental Injury: Teeth and Oropharynx as per pre-operative assessment

## 2016-11-28 LAB — BASIC METABOLIC PANEL
ANION GAP: 11 (ref 5–15)
BUN: 5 mg/dL — ABNORMAL LOW (ref 6–20)
CALCIUM: 8.8 mg/dL — AB (ref 8.9–10.3)
CO2: 25 mmol/L (ref 22–32)
CREATININE: 0.57 mg/dL (ref 0.44–1.00)
Chloride: 97 mmol/L — ABNORMAL LOW (ref 101–111)
Glucose, Bld: 99 mg/dL (ref 65–99)
Potassium: 3.9 mmol/L (ref 3.5–5.1)
SODIUM: 133 mmol/L — AB (ref 135–145)

## 2016-11-28 LAB — CBC
HEMATOCRIT: 36.7 % (ref 36.0–46.0)
Hemoglobin: 12.7 g/dL (ref 12.0–15.0)
MCH: 30.3 pg (ref 26.0–34.0)
MCHC: 34.6 g/dL (ref 30.0–36.0)
MCV: 87.6 fL (ref 78.0–100.0)
PLATELETS: 262 10*3/uL (ref 150–400)
RBC: 4.19 MIL/uL (ref 3.87–5.11)
RDW: 13.5 % (ref 11.5–15.5)
WBC: 12.5 10*3/uL — AB (ref 4.0–10.5)

## 2016-11-28 LAB — GLUCOSE, CAPILLARY: Glucose-Capillary: 85 mg/dL (ref 65–99)

## 2016-11-28 MED ORDER — LEVOTHYROXINE SODIUM 100 MCG IV SOLR
12.5000 ug | Freq: Every day | INTRAVENOUS | Status: DC
  Administered 2016-11-28 – 2016-11-30 (×3): 12.5 ug via INTRAVENOUS
  Filled 2016-11-28 (×3): qty 5

## 2016-11-28 NOTE — Progress Notes (Signed)
Patient ID: Summer Jones, female   DOB: Jul 29, 1930, 83 y.o.   MRN: [id number]  PROGRESS NOTE    Summer Jones  NUU:[id number] DOB: 1929-10-25 DOA: 11/23/2016  PCP: [name], MD   Brief Narrative:  83 year old female with medical history significant for CBD obstruction status post stent, hypertension, hypothyroidism who presented to [facility] long with abdominal pain, nausea and vomiting. Over the past 2 weeks her abdominal pain became worse and more frequent located in the mid abdomen and left and right side. Pain is about 7 out of 10 in intensity, constant. Patient reported vomiting each time she eats or drinks. No fevers, no diarrhea. Pt has been seen by GI, Dr. [name]. Per Dr. Audelia Acton note on 09/15/16, pt had vague, masslike area in the head of the pancreas. Cytology andpathology with forcep biopsies through interventional radiology on 3 separate occasions have never shown clear malignancy,but a few 'atypical cells' were noted.  On admission, patient was hemodynamically stable. Lipase was 496, normal LFTs. There were moderate amount of leukocytes on urinalysis.CT abdomen and pelvis showed pneumobilia status post biliary stent with minimal debris in the distal aspect of the stent but no space occupying mass of the liver, right upper pole renal calculus without obstruction.  GI has seen the patient in consultation. Pt underwent EGD 4/11/218. She also underwent [place] bypass 4/13.   Assessment & Plan:   Principal Problem:   Abdominal pain / partial gastric outlet obstruction peptic GE junction stricture  - Concern for narrowed duodenum, post bulbar and concerning for partial gastric outlet obstruction - EGD done 4/11 - mild peptic GE junction stricture, dilated with scope passage. Edematous stenosis of the distal duodenal bulb, this is in close proximity to the known biliary stricture that presented 1 1/2 years ago with painless jaundice. 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 Suspicionp that the biliary process is progressing and causing extrinsic compression of the duodenum, evolving gastric outlet obstruction - UGI barium - significantly dilated duodenal bulb with slow passage of contrast into the decompressed second portion of the duodenum, possibly related to stricture - Underwent GJ bypass 4/13; now has NG tube in place  - Continue IV fluids  Active Problems:   UTI - Patient started on Rocephin empirically while awaiting urine culture results - Urine culture with multiple species non-predominant - Stopped rocephin 4/12    Hypokalemia - Due to GI related issues - Supplement and now WNL    Hypothyroidism - Continue Synthroid 25 g daily    Essential hypertension - Continue Norvasc 10 mg daily and lisinopril 5 mg at bedtime   DVT prophylaxis: SCDs bilaterally  Code Status: full code  Family Communication: Husband at the bedside Disposition Plan: not stable for discharge yet    Consultants:   Gastroenterology, Dr. [name]  Procedures:   EGD 11/25/2016 - mild peptic GE junction stricture, dilated with scope passage. Edematous stenosis of the distal duodenal bulb, this is in close proximity to the known biliary stricture that presented 1 1/2 years ago with painless jaundice. 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 Suspicionp that the biliary process is progressing and causing extrinsic compression of the duodenum, evolving gastric outlet obstruction  GJ bypass 4/13  NG tube 4/13  Antimicrobials:   Rocephin  11/24/2016 --> 11/26/2016    Subjective: No overnight events  Objective: Vitals:   11/27/16 1148 11/27/16 1428 11/27/16 2033 11/28/16 0429  BP: 128/78 138/81 (!) 149/78 130/65  Pulse: 83 82 88 79    Resp: 19 19 18 18   Temp: 97.5 F (36.4 C) 97.7 F (36.5 C) 97.9 F (36.6 C) 98.6 F (37 C)  TempSrc: Oral Oral Oral Oral  SpO2: 100% 100% 100% 99%  Weight:      Height:        Intake/Output Summary (Last 24 hours) at 11/28/16 1405 Last data filed at 11/28/16 0747  Gross per 24 hour  Intake          1368.33 ml  Output             2200 ml  Net          -831.67 ml   Filed Weights   11/23/16 2127 11/24/16 0550 11/26/16 0500  Weight: 44.5 kg (98 lb) 43 kg (94 lb 12.8 oz) 43 kg (94 lb 12.8 oz)    Examination:  General exam: no distress, calm and comfortable  Respiratory system: No wheezing, no rhonchi  Cardiovascular system: S1 & S2 heard, rate controlled  Gastrointestinal system: (+) BS, non tender, has NG tube in place  Central nervous system: No focal deficits  Extremities: No swelling, bilateral pulses Skin: no lesions, no ulcers  Psychiatry: Normal mood and behavior  Data Reviewed: I have personally reviewed following labs and imaging studies  CBC:  Recent Labs Lab 11/23/16 2146 11/25/16 0338 11/26/16 0420 11/27/16 0409 11/28/16 0431  WBC 5.3 4.5 4.6 5.8 12.5*  HGB 13.1 10.7* 10.4* 11.7* 12.7  HCT 38.4 31.2* 30.4* 34.2* 36.7  MCV 90.8 91.5 91.0 90.5 87.6  PLT 297 245 253 263 001   Basic Metabolic Panel:  Recent Labs Lab 11/23/16 2146 11/25/16 0338 11/26/16 0420 11/27/16 0409 11/28/16 0431  NA 133* 134* 141 140 133*  K 3.9 3.5 3.1* 3.1* 3.9  CL 94* 107 109 107 97*  CO2 28 22 27 26 25   GLUCOSE 128* 110* 98 103* 99  BUN 13 7 <5* <5* 5*  CREATININE 0.75 0.49 0.49 0.54 0.57  CALCIUM 10.2 8.2* 8.3* 8.7* 8.8*   GFR: Estimated Creatinine Clearance: 35.5 mL/min (by C-G formula based on SCr of 0.57 mg/dL). Liver Function Tests:  Recent Labs Lab 11/23/16 2146 11/25/16 0338  AST 35 31  ALT 33 22  ALKPHOS 121 78  BILITOT 0.9 0.9  PROT 7.7 5.5*  ALBUMIN 4.6 3.1*    Recent Labs Lab 11/23/16 2146 11/25/16 0338  LIPASE 496* 273*   No results  for input(s): AMMONIA in the last 168 hours. Coagulation Profile: No results for input(s): INR, PROTIME in the last 168 hours. Cardiac Enzymes: No results for input(s): CKTOTAL, CKMB, CKMBINDEX, TROPONINI in the last 168 hours. BNP (last 3 results) No results for input(s): PROBNP in the last 8760 hours. HbA1C: No results for input(s): HGBA1C in the last 72 hours. CBG:  Recent Labs Lab 11/24/16 0735 11/25/16 0754 11/26/16 0743 11/27/16 0800 11/28/16 0730  GLUCAP 118* 109* 94 94 85   Lipid Profile: No results for input(s): CHOL, HDL, LDLCALC, TRIG, CHOLHDL, LDLDIRECT in the last 72 hours. Thyroid Function Tests: No results for input(s): TSH, T4TOTAL, FREET4, T3FREE, THYROIDAB in the last 72 hours. Anemia Panel: No results for input(s): VITAMINB12, FOLATE, FERRITIN, TIBC, IRON, RETICCTPCT in the last 72 hours. Urine analysis:    Component Value Date/Time   COLORURINE  YELLOW 11/23/2016 2133   APPEARANCEUR HAZY (A) 11/23/2016 2133   LABSPEC 1.020 11/23/2016 2133   PHURINE 5.0 11/23/2016 2133   GLUCOSEU NEGATIVE 11/23/2016 2133   HGBUR NEGATIVE 11/23/2016 2133   BILIRUBINUR NEGATIVE 11/23/2016 2133   KETONESUR 80 (A) 11/23/2016 2133   PROTEINUR 30 (A) 11/23/2016 2133   NITRITE NEGATIVE 11/23/2016 2133   LEUKOCYTESUR MODERATE (A) 11/23/2016 2133   Sepsis Labs: @LABRCNTIP (procalcitonin:4,lacticidven:4)  Urine culture     Status: Abnormal   Collection Time: 11/23/16 11:30 PM  Result Value Ref Range Status   Specimen Description URINE, CLEAN CATCH  Final   Special Requests NONE  Final   Culture MULTIPLE SPECIES PRESENT, SUGGEST RECOLLECTION (A)  Final   Report Status 11/25/2016 FINAL  Final  Surgical pcr screen     Status: None   Collection Time: 11/26/16  4:56 PM  Result Value Ref Range Status   MRSA, PCR NEGATIVE NEGATIVE Final   Staphylococcus aureus NEGATIVE NEGATIVE Final      Radiology Studies: Ct Abdomen Pelvis W Contrast Result Date: 11/24/2016 1.  Pneumobilia status post biliary stent. Minimal debris again noted in the distal aspect of the stent. No space-occupying mass of the liver. Hepatic steatosis. 2. Punctate right upper pole renal calculus without obstruction. Mild ectasia of the right renal collecting system as before, chronic in appearance. Small left renal cyst too small to further characterize at 4 mm in the upper pole. 3. Subtle hypodensity in the uterus on the right may reflect a small intramural fibroid. 4. Marked gastric distention with fluid suspicious for gastroparesis. No small or large bowel obstruction or inflammation. 5. Spondylosis of the dorsal spine. Electronically Signed   By: Ashley Royalty M.D.   On: 11/24/2016 03:08   Dg Ugi W/high Density W/kub Result Date: 11/26/2016 Significantly dilated duodenal bulb with slow passage of contrast into the decompressed second portion of the duodenum, possibly related to stricture. Electronically Signed   By: Rolm Baptise M.D.   On: 11/26/2016 10:39     Scheduled Meds: . amLODipine  10 mg Oral QHS  . levothyroxine  12.5 mcg Intravenous Daily  . lisinopril  5 mg Oral QHS  . metoCLOPramide (REGLAN) injection  5 mg Intravenous BID  . multivitamin with minerals  1 tablet Oral Daily  . pantoprazole (PROTONIX) IV  40 mg Intravenous Daily   Continuous Infusions: . sodium chloride 125 mL/hr at 11/28/16 0747     LOS: 4 days    Time spent: 25 minutes  Greater than 50% of the time spent on counseling and coordinating the care.   Leisa Lenz, MD [facility] Pager [phone]  If 7PM-7AM, please contact night-coverage [email or web address] Password [email or web address] 11/28/2016, 2:05 PM

## 2016-11-28 NOTE — Progress Notes (Signed)
1 Day Post-Op  Subjective: No complaints.  Objective: Vital signs in last 24 hours: Temp:  [97.5 F (36.4 C)-98.6 F (37 C)] 98.6 F (37 C) (04/14 0429) Pulse Rate:  [79-88] 79 (04/14 0429) Resp:  [18-20] 18 (04/14 0429) BP: (96-173)/(64-156) 130/65 (04/14 0429) SpO2:  [97 %-100 %] 99 % (04/14 0429) Last BM Date: 11/24/16  Intake/Output from previous day: 04/13 0701 - 04/14 0700 In: 1360 [P.O.:360; I.V.:1000] Out: 1645 [Urine:1600; Emesis/NG output:20; Blood:25] Intake/Output this shift: Total I/O In: 360 [P.O.:360] Out: 1200 [Urine:1200]  Resp: clear to auscultation bilaterally Cardio: regular rate and rhythm GI: soft, nontender. ng in place. good bs. incision looks good  Lab Results:   Recent Labs  11/27/16 0409 11/28/16 0431  WBC 5.8 12.5*  HGB 11.7* 12.7  HCT 34.2* 36.7  PLT 263 262   BMET  Recent Labs  11/26/16 0420 11/27/16 0409  NA 141 140  K 3.1* 3.1*  CL 109 107  CO2 27 26  GLUCOSE 98 103*  BUN <5* <5*  CREATININE 0.49 0.54  CALCIUM 8.3* 8.7*   PT/INR No results for input(s): LABPROT, INR in the last 72 hours. ABG No results for input(s): PHART, HCO3 in the last 72 hours.  Invalid input(s): PCO2, PO2  Studies/Results: Dg Ugi W/high Density W/kub  Result Date: 11/26/2016 CLINICAL DATA:  Duodenal stricture. Assess for gastric outlet obstruction. EXAM: UPPER GI SERIES WITH KUB TECHNIQUE: After obtaining a scout radiograph a routine upper GI series was performed using thin barium FLUOROSCOPY TIME:  Fluoroscopy Time:  4.8 minutes Radiation Exposure Index (if provided by the fluoroscopic device): 39.4 mGy Number of Acquired Spot Images: 0 COMPARISON:  CT 11/24/2016 FINDINGS: Scout image of the abdomen shows nonobstructive bowel gas pattern. Metallic biliary stent is in place. Fluoroscopic evaluation of swallowing demonstrates mild mucosal ring in the distal esophagus. Otherwise no fixed stricture, fold thickening or mass. The stomach is elongated.  There is prompt emptying of the stomach into a dilated duodenal bulb, with very slow emptying from the duodenal bulb into the decompressed second portion of the duodenum. Over time, very slow passage of contrast from the dilated duodenal bulb into the decompressed second portion of the duodenum. Reflux of contrast into the biliary stent. IMPRESSION: Significantly dilated duodenal bulb with slow passage of contrast into the decompressed second portion of the duodenum, possibly related to stricture. Electronically Signed   By: Rolm Baptise M.D.   On: 11/26/2016 10:39    Anti-infectives: Anti-infectives    Start     Dose/Rate Route Frequency Ordered Stop   11/27/16 0830  cefoTEtan in Dextrose 5% (CEFOTAN) 2-2.08 GM-% IVPB    Comments:  Marla Roe   : cabinet override      11/27/16 0830 11/27/16 2044   11/27/16 0600  ceFAZolin (ANCEF) IVPB 2g/100 mL premix  Status:  Discontinued     2 g 200 mL/hr over 30 Minutes Intravenous On call to O.R. 11/26/16 1524 11/26/16 1558   11/26/16 1615  cefoTEtan (CEFOTAN) 2 g in dextrose 5 % 50 mL IVPB    Comments:  Pharmacy may adjust dose strength for optimal dosing.   Send with patient on call to the OR.  Anesthesia to complete antibiotic administration <56min prior to incision per [facility].   2 g 100 mL/hr over 30 Minutes Intravenous On call to O.R. 11/26/16 1558 11/27/16 0559   11/24/16 0600  cefTRIAXone (ROCEPHIN) 1 g in dextrose 5 % 50 mL IVPB  Status:  Discontinued  1 g 100 mL/hr over 30 Minutes Intravenous Every 24 hours 11/24/16 0542 11/26/16 0945      Assessment/Plan: s/p Procedure(s): GASTROJEJUNOSTOMY (N/A) PANCREACTIC BIOPSY Will clamp ng once she passes flatus  If she tolerates this then we can remove ng and start diet Ambulate Await path  LOS: 4 days    TOTH III,PAUL S 11/28/2016

## 2016-11-29 DIAGNOSIS — R1084 Generalized abdominal pain: Secondary | ICD-10-CM

## 2016-11-29 LAB — GLUCOSE, CAPILLARY: Glucose-Capillary: 67 mg/dL (ref 65–99)

## 2016-11-29 NOTE — Progress Notes (Signed)
Patient ID: Summer Jones, female   DOB: Apr 25, 1930, 83 y.o.   MRN: [id number]  PROGRESS NOTE    Summer Jones  [id number] DOB: 25-Mar-1930 DOA: 11/23/2016  PCP: [name], MD   Brief Narrative:  83 year old female with medical history significant for CBD obstruction status post stent, hypertension, hypothyroidism who presented to [facility] long with abdominal pain, nausea and vomiting. Over the past 2 weeks her abdominal pain became worse and more frequent located in the mid abdomen and left and right side. Pain was about 7 out of 10 in intensity, constant. Patient reported vomiting each time she eats or drinks. No fevers, no diarrhea. Pt has been seen by GI, Dr. [name]. Per Dr. Audelia Acton note on 09/15/16, pt had vague, masslike area in the head of the pancreas. Cytology andpathology with forcep biopsies through interventional radiology on 3 separate occasions have never shown clear malignancy,but a few 'atypical cells' were noted.  On admission, patient was hemodynamically stable. Lipase was 496, normal LFTs. There were moderate amount of leukocytes on urinalysis.CT abdomen and pelvis showed pneumobilia status post biliary stent with minimal debris in the distal aspect of the stent but no space occupying mass of the liver, right upper pole renal calculus without obstruction.  GI has seen the patient in consultation. Pt underwent EGD 4/11/218. She also underwent [place] bypass 4/13.   Assessment & Plan:   Principal Problem:   Abdominal pain / partial gastric outlet obstruction peptic GE junction stricture  - There was a concern for narrowed duodenum, post bulbar and concerning for partial gastric outlet obstruction - Pt had EGD done 4/11 - mild peptic GE junction stricture, dilated with scope passage. Edematous stenosis of the distal duodenal bulb, this is in close proximity to the known biliary stricture that presented 1 1/2 years ago with painless jaundice. 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 Suspicionp that the biliary process is progressing and causing extrinsic compression of the duodenum, evolving gastric outlet obstruction - UGI barium - significantly dilated duodenal bulb with slow passage of contrast into the decompressed second portion of the duodenum, possibly related to stricture - pt underwent GJ bypass 4/13; now has NG tube in place  - Continue IV fluids for hydration   Active Problems:   UTI - Patient started on Rocephin empirically while awaiting urine culture results - Urine culture with multiple species non-predominant - Stopped rocephin 4/12    Hypokalemia - Due to GI related issues - Supplement and WNL    Hypothyroidism - Continue Synthroid 25 g daily    Essential hypertension - Continue Norvasc 10 mg daily and lisinopril 5 mg at bedtime   DVT prophylaxis: SCDs bilaterally  Code Status: full code  Family Communication: Husband at the bedside Disposition Plan: not stable for discharge; home once cleared by surgery, once NG tube out and she tolerates PO intake    Consultants:   Gastroenterology, Dr. [name]  Procedures:   EGD 11/25/2016 - mild peptic GE junction stricture, dilated with scope passage. Edematous stenosis of the distal duodenal bulb, this is in close proximity to the known biliary stricture that presented 1 1/2 years ago with painless jaundice. 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 Suspicionp that the biliary process is progressing and causing extrinsic  compression of the duodenum, evolving gastric outlet obstruction  GJ bypass 4/13  NG tube 4/13  Antimicrobials:   Rocephin 11/24/2016 --> 11/26/2016    Subjective: No overnight events  Objective: Vitals:   11/28/16 0429  11/28/16 1408 11/28/16 1956 11/29/16 0446  BP: 130/65 131/64 (!) 149/79 136/84  Pulse: 79 90 94 98  Resp: 18 16 14 14   Temp: 98.6 F (37 C) 98.3 F (36.8 C) 98.5 F (36.9 C) 98.4 F (36.9 C)  TempSrc: Oral Oral Oral Oral  SpO2: 99% 97% 97% 98%  Weight:    42.3 kg (93 lb 4.1 oz)  Height:        Intake/Output Summary (Last 24 hours) at 11/29/16 1124 Last data filed at 11/29/16 0600  Gross per 24 hour  Intake          2837.09 ml  Output              650 ml  Net          2187.09 ml   Filed Weights   11/24/16 0550 11/26/16 0500 11/29/16 0446  Weight: 43 kg (94 lb 12.8 oz) 43 kg (94 lb 12.8 oz) 42.3 kg (93 lb 4.1 oz)    Examination:  General exam: no distress Respiratory system: No wheezing, no rhonchi, bilateral air entry  Cardiovascular system: S1 & S2 heard, rate controlled  Gastrointestinal system: (+) BS, non tender, has NG tube in place  Central nervous system: Non focal Extremities: No edema, palpable pulses Skin: no lesions, no ulcers  Psychiatry: Normal mood, no agitation, no restlessness  Data Reviewed: I have personally reviewed following labs and imaging studies  CBC:  Recent Labs Lab 11/23/16 2146 11/25/16 0338 11/26/16 0420 11/27/16 0409 11/28/16 0431  WBC 5.3 4.5 4.6 5.8 12.5*  HGB 13.1 10.7* 10.4* 11.7* 12.7  HCT 38.4 31.2* 30.4* 34.2* 36.7  MCV 90.8 91.5 91.0 90.5 87.6  PLT 297 245 253 263 660   Basic Metabolic Panel:  Recent Labs Lab 11/23/16 2146 11/25/16 0338 11/26/16 0420 11/27/16 0409 11/28/16 0431  NA 133* 134* 141 140 133*  K 3.9 3.5 3.1* 3.1* 3.9  CL 94* 107 109 107 97*  CO2 28 22 27 26 25   GLUCOSE 128* 110* 98 103* 99  BUN 13 7 <5* <5* 5*  CREATININE 0.75 0.49 0.49 0.54 0.57  CALCIUM 10.2 8.2* 8.3* 8.7* 8.8*   GFR: Estimated Creatinine Clearance: 35 mL/min (by C-G formula based on SCr of 0.57 mg/dL). Liver Function Tests:  Recent Labs Lab 11/23/16 2146 11/25/16 0338  AST 35 31  ALT 33 22  ALKPHOS 121 78  BILITOT  0.9 0.9  PROT 7.7 5.5*  ALBUMIN 4.6 3.1*    Recent Labs Lab 11/23/16 2146 11/25/16 0338  LIPASE 496* 273*   No results for input(s): AMMONIA in the last 168 hours. Coagulation Profile: No results for input(s): INR, PROTIME in the last 168 hours. Cardiac Enzymes: No results for input(s): CKTOTAL, CKMB, CKMBINDEX, TROPONINI in the last 168 hours. BNP (last 3 results) No results for input(s): PROBNP in the last 8760 hours. HbA1C: No results for input(s): HGBA1C in the last 72 hours. CBG:  Recent Labs Lab 11/25/16 0754 11/26/16 0743 11/27/16 0800 11/28/16 0730 11/29/16 0744  GLUCAP 109* 94 94 85 67   Lipid Profile: No results for input(s): CHOL, HDL, LDLCALC, TRIG, CHOLHDL, LDLDIRECT in the last 72 hours. Thyroid Function Tests: No results for input(s): TSH, T4TOTAL, FREET4, T3FREE, THYROIDAB in the last 72 hours. Anemia  Panel: No results for input(s): VITAMINB12, FOLATE, FERRITIN, TIBC, IRON, RETICCTPCT in the last 72 hours. Urine analysis:    Component Value Date/Time   COLORURINE YELLOW 11/23/2016 2133   APPEARANCEUR HAZY (A) 11/23/2016 2133   LABSPEC 1.020 11/23/2016 2133   PHURINE 5.0 11/23/2016 2133   GLUCOSEU NEGATIVE 11/23/2016 2133   HGBUR NEGATIVE 11/23/2016 2133   BILIRUBINUR NEGATIVE 11/23/2016 2133   KETONESUR 80 (A) 11/23/2016 2133   PROTEINUR 30 (A) 11/23/2016 2133   NITRITE NEGATIVE 11/23/2016 2133   LEUKOCYTESUR MODERATE (A) 11/23/2016 2133   Sepsis Labs: @LABRCNTIP (procalcitonin:4,lacticidven:4)  Urine culture     Status: Abnormal   Collection Time: 11/23/16 11:30 PM  Result Value Ref Range Status   Specimen Description URINE, CLEAN CATCH  Final   Special Requests NONE  Final   Culture MULTIPLE SPECIES PRESENT, SUGGEST RECOLLECTION (A)  Final   Report Status 11/25/2016 FINAL  Final  Surgical pcr screen     Status: None   Collection Time: 11/26/16  4:56 PM  Result Value Ref Range Status   MRSA, PCR NEGATIVE NEGATIVE Final   Staphylococcus  aureus NEGATIVE NEGATIVE Final      Radiology Studies: Ct Abdomen Pelvis W Contrast Result Date: 11/24/2016 1. Pneumobilia status post biliary stent. Minimal debris again noted in the distal aspect of the stent. No space-occupying mass of the liver. Hepatic steatosis. 2. Punctate right upper pole renal calculus without obstruction. Mild ectasia of the right renal collecting system as before, chronic in appearance. Small left renal cyst too small to further characterize at 4 mm in the upper pole. 3. Subtle hypodensity in the uterus on the right may reflect a small intramural fibroid. 4. Marked gastric distention with fluid suspicious for gastroparesis. No small or large bowel obstruction or inflammation. 5. Spondylosis of the dorsal spine. Electronically Signed   By: Ashley Royalty M.D.   On: 11/24/2016 03:08   Dg Ugi W/high Density W/kub Result Date: 11/26/2016 Significantly dilated duodenal bulb with slow passage of contrast into the decompressed second portion of the duodenum, possibly related to stricture. Electronically Signed   By: Rolm Baptise M.D.   On: 11/26/2016 10:39     Scheduled Meds: . amLODipine  10 mg Oral QHS  . levothyroxine  12.5 mcg Intravenous Daily  . lisinopril  5 mg Oral QHS  . metoCLOPramide (REGLAN) injection  5 mg Intravenous BID  . multivitamin with minerals  1 tablet Oral Daily  . pantoprazole (PROTONIX) IV  40 mg Intravenous Daily   Continuous Infusions: . sodium chloride 125 mL/hr at 11/28/16 2335     LOS: 5 days    Time spent: 15 minutes  Greater than 50% of the time spent on counseling and coordinating the care.   Leisa Lenz, MD [facility] Pager [phone]  If 7PM-7AM, please contact night-coverage [email or web address] Password [email or web address] 11/29/2016, 11:24 AM

## 2016-11-29 NOTE — Progress Notes (Signed)
Pt had BM. Dr. Marlou Starks notified. Orders to pull NG tube and start clear liquid diet. Will continue to monitor.

## 2016-11-29 NOTE — Progress Notes (Signed)
2 Days Post-Op  Subjective: No complaints. No flatus yet  Objective: Vital signs in last 24 hours: Temp:  [98.3 F (36.8 C)-98.5 F (36.9 C)] 98.4 F (36.9 C) (04/15 0446) Pulse Rate:  [90-98] 98 (04/15 0446) Resp:  [14-16] 14 (04/15 0446) BP: (131-149)/(64-84) 136/84 (04/15 0446) SpO2:  [97 %-98 %] 98 % (04/15 0446) Weight:  [42.3 kg (93 lb 4.1 oz)] 42.3 kg (93 lb 4.1 oz) (04/15 0446) Last BM Date: 11/23/16  Intake/Output from previous day: 04/14 0701 - 04/15 0700 In: 3845.4 [P.O.:60; I.V.:3785.4] Out: 1050 [Urine:200; Emesis/NG output:850] Intake/Output this shift: No intake/output data recorded.  General appearance: alert and cooperative Resp: clear to auscultation bilaterally Cardio: regular rate and rhythm GI: soft, nontender. few bs. incision looks good  Lab Results:   Recent Labs  11/27/16 0409 11/28/16 0431  WBC 5.8 12.5*  HGB 11.7* 12.7  HCT 34.2* 36.7  PLT 263 262   BMET  Recent Labs  11/27/16 0409 11/28/16 0431  NA 140 133*  K 3.1* 3.9  CL 107 97*  CO2 26 25  GLUCOSE 103* 99  BUN <5* 5*  CREATININE 0.54 0.57  CALCIUM 8.7* 8.8*   PT/INR No results for input(s): LABPROT, INR in the last 72 hours. ABG No results for input(s): PHART, HCO3 in the last 72 hours.  Invalid input(s): PCO2, PO2  Studies/Results: No results found.  Anti-infectives: Anti-infectives    Start     Dose/Rate Route Frequency Ordered Stop   11/27/16 0830  cefoTEtan in Dextrose 5% (CEFOTAN) 2-2.08 GM-% IVPB    Comments:  Marla Roe   : cabinet override      11/27/16 0830 11/27/16 2044   11/27/16 0600  ceFAZolin (ANCEF) IVPB 2g/100 mL premix  Status:  Discontinued     2 g 200 mL/hr over 30 Minutes Intravenous On call to O.R. 11/26/16 1524 11/26/16 1558   11/26/16 1615  cefoTEtan (CEFOTAN) 2 g in dextrose 5 % 50 mL IVPB    Comments:  Pharmacy may adjust dose strength for optimal dosing.   Send with patient on call to the OR.  Anesthesia to complete antibiotic  administration <22min prior to incision per [facility]   2 g 100 mL/hr over 30 Minutes Intravenous On call to O.R. 11/26/16 1558 11/27/16 0559   11/24/16 0600  cefTRIAXone (ROCEPHIN) 1 g in dextrose 5 % 50 mL IVPB  Status:  Discontinued     1 g 100 mL/hr over 30 Minutes Intravenous Every 24 hours 11/24/16 0542 11/26/16 0945      Assessment/Plan: s/p Procedure(s): GASTROJEJUNOSTOMY (N/A) PANCREACTIC BIOPSY Continue ng and bowel rest until bowel function returns  Ambulate POD 2  LOS: 5 days    TOTH III,PAUL S 11/29/2016

## 2016-11-30 ENCOUNTER — Encounter (HOSPITAL_COMMUNITY): Payer: Self-pay | Admitting: General Surgery

## 2016-11-30 LAB — GLUCOSE, CAPILLARY: Glucose-Capillary: 114 mg/dL — ABNORMAL HIGH (ref 65–99)

## 2016-11-30 LAB — BASIC METABOLIC PANEL
Anion gap: 9 (ref 5–15)
CALCIUM: 8 mg/dL — AB (ref 8.9–10.3)
CO2: 24 mmol/L (ref 22–32)
Chloride: 103 mmol/L (ref 101–111)
Creatinine, Ser: 0.5 mg/dL (ref 0.44–1.00)
GFR calc Af Amer: >60 mL/min (ref >60)
GFR calc non Af Amer: >60 mL/min (ref >60)
GLUCOSE: 94 mg/dL (ref 65–99)
Potassium: 2.8 mmol/L — ABNORMAL LOW (ref 3.5–5.1)
SODIUM: 136 mmol/L (ref 135–145)

## 2016-11-30 LAB — CBC
HCT: 30.6 % — ABNORMAL LOW (ref 36.0–46.0)
Hemoglobin: 10.4 g/dL — ABNORMAL LOW (ref 12.0–15.0)
MCH: 29.7 pg (ref 26.0–34.0)
MCHC: 34 g/dL (ref 30.0–36.0)
MCV: 87.4 fL (ref 78.0–100.0)
PLATELETS: 246 10*3/uL (ref 150–400)
RBC: 3.5 MIL/uL — ABNORMAL LOW (ref 3.87–5.11)
RDW: 13.6 % (ref 11.5–15.5)
WBC: 7 10*3/uL (ref 4.0–10.5)

## 2016-11-30 LAB — MAGNESIUM: Magnesium: 1.5 mg/dL — ABNORMAL LOW (ref 1.7–2.4)

## 2016-11-30 MED ORDER — MAGNESIUM OXIDE 400 (241.3 MG) MG PO TABS: 400.0000 mg | ORAL_TABLET | Freq: Two times a day (BID) | ORAL | Status: DC

## 2016-11-30 MED ORDER — LEVOTHYROXINE SODIUM 25 MCG PO TABS
25.0000 ug | ORAL_TABLET | Freq: Every day | ORAL | Status: DC
  Administered 2016-12-01: 25 ug via ORAL
  Filled 2016-11-30: qty 1

## 2016-11-30 MED ORDER — MAGNESIUM SULFATE 2 GM/50ML IV SOLN
2.0000 g | Freq: Once | INTRAVENOUS | Status: AC
  Administered 2016-11-30: 2 g via INTRAVENOUS
  Filled 2016-11-30: qty 50

## 2016-11-30 MED ORDER — POTASSIUM CHLORIDE CRYS ER 20 MEQ PO TBCR
20.0000 meq | EXTENDED_RELEASE_TABLET | Freq: Three times a day (TID) | ORAL | Status: DC
  Administered 2016-11-30 – 2016-12-01 (×3): 20 meq via ORAL
  Filled 2016-11-30 (×3): qty 1

## 2016-11-30 MED ORDER — TRAMADOL HCL 50 MG PO TABS
50.0000 mg | ORAL_TABLET | Freq: Four times a day (QID) | ORAL | Status: DC | PRN
Start: 2016-11-30 — End: 2016-12-01

## 2016-11-30 MED ORDER — KCL IN DEXTROSE-NACL 40-5-0.9 MEQ/L-%-% IV SOLN
INTRAVENOUS | Status: DC
  Administered 2016-11-30: 17:00:00 via INTRAVENOUS
  Filled 2016-11-30 (×2): qty 1000

## 2016-11-30 MED ORDER — MORPHINE SULFATE (PF) 4 MG/ML IV SOLN: 1.0000 mg | INTRAVENOUS | Status: DC | PRN

## 2016-11-30 MED ORDER — PANTOPRAZOLE SODIUM 40 MG PO TBEC
40.0000 mg | DELAYED_RELEASE_TABLET | Freq: Every day | ORAL | Status: DC
Start: 2016-12-01 — End: 2016-12-01
  Filled 2016-11-30: qty 1

## 2016-11-30 MED ORDER — ACETAMINOPHEN 325 MG PO TABS: 650.0000 mg | ORAL_TABLET | Freq: Four times a day (QID) | ORAL | Status: DC | PRN

## 2016-11-30 MED ORDER — ENOXAPARIN SODIUM 30 MG/0.3ML ~~LOC~~ SOLN
30.0000 mg | SUBCUTANEOUS | Status: DC
  Filled 2016-11-30: qty 0.3

## 2016-11-30 NOTE — Care Management Important Message (Signed)
Important Message  Patient Details  Name: Summer Jones MRN: [id number] Date of Birth: 12-12-1929   Medicare Important Message Given:  Yes    Kerin Salen 11/30/2016, 11:53 AMImportant Message  Patient Details  Name: Summer Jones MRN: [id number] Date of Birth: 01-29-1930   Medicare Important Message Given:  Yes    Kerin Salen 11/30/2016, 11:53 AM

## 2016-11-30 NOTE — Progress Notes (Signed)
Patient ID: Summer Jones, female   DOB: 1929/08/19, 83 y.o.   MRN: [id number]  PROGRESS NOTE    Summer Jones  LKG:[id number] DOB: 03-08-1930 DOA: 11/23/2016  PCP: [name], MD   Brief Narrative:  83 year old female with medical history significant for CBD obstruction status post stent, hypertension, hypothyroidism who presented to [facility] long with abdominal pain, nausea and vomiting. Over the past 2 weeks her abdominal pain became worse and more frequent located in the mid abdomen and left and right side. Pain was about 7 out of 10 in intensity, constant. Patient reported vomiting each time she eats or drinks. No fevers, no diarrhea. Pt has been seen by GI, Dr. [name]. Per Dr. Audelia Acton note on 09/15/16, pt had vague, masslike area in the head of the pancreas. Cytology andpathology with forcep biopsies through interventional radiology on 3 separate occasions have never shown clear malignancy,but a few 'atypical cells' were noted.  On admission, patient was hemodynamically stable. Lipase was 496, normal LFTs. There were moderate amount of leukocytes on urinalysis.CT abdomen and pelvis showed pneumobilia status post biliary stent with minimal debris in the distal aspect of the stent but no space occupying mass of the liver. GI has seen the patient in consultation. Pt underwent EGD 4/11/218 for common bile duct stricture. She also underwent GJ bypass 4/13 due to gastric outlet obstruction.   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 Edematous stenosis of the distal duodenal bulb, this is in close proximity to the known biliary stricture that presented 1 1/2 years ago with painless jaundice. 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 Suspicionp that the  biliary process is progressing and causing extrinsic compression of the duodenum, evolving gastric outlet obstruction. UGI barium - significantly dilated duodenal bulb with slow passage of contrast into the decompressed second portion of the duodenum, possibly related to stricture - s/p GJ bypass 4/13, tolerating clear diet, +BM - Pain control and IVF per surgery - PT evaluation pending  UTI: Patient was started on Rocephin empirically while awaiting urine culture results - Urine culture with multiple species non-predominant: Stopped rocephin 4/12  Hypokalemia Due to GI related issues - Supplement and WNL  Hypothyroidism - Continue Synthroid 25 g daily  Essential hypertension - Continue Norvasc 10 mg daily and lisinopril 5 mg at bedtime  DVT prophylaxis: SCDs bilaterally  Code Status: full code  Family Communication: Husband at the bedside Disposition Plan: Home vs. other once cleared by surgery, likely 24 - 48 hours. Therapy evaluations pending.  Consultants:   Gastroenterology, Dr. [name]  General surgery, Dr. [name], Dr. Marlou Starks  Procedures:   EGD 11/25/2016 - mild peptic GE junction stricture, dilated with scope passage. Edematous stenosis of the distal duodenal bulb, this is in close proximity to the known biliary stricture that presented 1 1/2 years ago with painless jaundice. 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 Suspicionp that the biliary process is progressing and causing extrinsic compression of the duodenum, evolving gastric outlet obstruction  GJ bypass 4/13  NG tube 4/13 > 4/15  Antimicrobials: Rocephin 11/24/2016 --> 11/26/2016   Subjective: Pt tolerating clears, +BM. Ambulating. Pain controlled.  Objective: Vitals:  11/29/16 1407 11/29/16 2055 11/30/16 0612 11/30/16 1508  BP: (!) 144/69 (!) 141/65 (!) 141/63 (!) 145/70  Pulse: 87 80  87 81  Resp: 16 16 20 18   Temp: 97.9 F (36.6 C) 98.6 F (37 C) 98.7 F (37.1 C) 98.1 F (36.7 C)  TempSrc: Oral Oral Oral Oral  SpO2: 98% 98% 95% 98%  Weight:      Height:        Intake/Output Summary (Last 24 hours) at 11/30/16 1601 Last data filed at 11/30/16 0700  Gross per 24 hour  Intake             2480 ml  Output                0 ml  Net             2480 ml   Filed Weights   11/24/16 0550 11/26/16 0500 11/29/16 0446  Weight: 43 kg (94 lb 12.8 oz) 43 kg (94 lb 12.8 oz) 42.3 kg (93 lb 4.1 oz)    Examination:  General exam: Elderly, pleasant F in no distress Respiratory system: No wheezing, no rhonchi, bilateral air entry. Nonlabored. Cardiovascular system: S1 & S2 heard, rate controlled  Gastrointestinal system: (+) BS, appropriately tender, nondistended Central nervous system: Non focal. Alert & oriented. Extremities: No edema, palpable pulses Skin: no lesions, no ulcers. Dressing c/d/i Psychiatry: Normal mood, no agitation, no restlessness  Data Reviewed: I have personally reviewed following labs and imaging studies  CBC:  Recent Labs Lab 11/25/16 0338 11/26/16 0420 11/27/16 0409 11/28/16 0431 11/30/16 0408  WBC 4.5 4.6 5.8 12.5* 7.0  HGB 10.7* 10.4* 11.7* 12.7 10.4*  HCT 31.2* 30.4* 34.2* 36.7 30.6*  MCV 91.5 91.0 90.5 87.6 87.4  PLT 245 253 263 262 443   Basic Metabolic Panel:  Recent Labs Lab 11/25/16 0338 11/26/16 0420 11/27/16 0409 11/28/16 0431 11/30/16 0408  NA 134* 141 140 133* 136  K 3.5 3.1* 3.1* 3.9 2.8*  CL 107 109 107 97* 103  CO2 22 27 26 25 24   GLUCOSE 110* 98 103* 99 94  BUN 7 <5* <5* 5* <5*  CREATININE 0.49 0.49 0.54 0.57 0.50  CALCIUM 8.2* 8.3* 8.7* 8.8* 8.0*   GFR: Estimated Creatinine Clearance: 35 mL/min (by C-G formula based on SCr of 0.5 mg/dL). Liver Function Tests:  Recent Labs Lab 11/23/16 2146 11/25/16 0338  AST 35 31  ALT 33 22  ALKPHOS 121 78  BILITOT 0.9 0.9  PROT 7.7 5.5*  ALBUMIN 4.6 3.1*     Recent Labs Lab 11/23/16 2146 11/25/16 0338  LIPASE 496* 273*   No results for input(s): AMMONIA in the last 168 hours. Coagulation Profile: No results for input(s): INR, PROTIME in the last 168 hours. Cardiac Enzymes: No results for input(s): CKTOTAL, CKMB, CKMBINDEX, TROPONINI in the last 168 hours. BNP (last 3 results) No results for input(s): PROBNP in the last 8760 hours. HbA1C: No results for input(s): HGBA1C in the last 72 hours. CBG:  Recent Labs Lab 11/26/16 0743 11/27/16 0800 11/28/16 0730 11/29/16 0744 11/30/16 0848  GLUCAP 94 94 85 67 114*   Lipid Profile: No results for input(s): CHOL, HDL, LDLCALC, TRIG, CHOLHDL, LDLDIRECT in the last 72 hours. Thyroid Function Tests: No results for input(s): TSH, T4TOTAL, FREET4, T3FREE, THYROIDAB in the last 72 hours. Anemia Panel: No results for input(s): VITAMINB12, FOLATE, FERRITIN, TIBC, IRON, RETICCTPCT in the last 72 hours. Urine analysis:    Component Value Date/Time   COLORURINE  YELLOW 11/23/2016 2133   APPEARANCEUR HAZY (A) 11/23/2016 2133   LABSPEC 1.020 11/23/2016 2133   PHURINE 5.0 11/23/2016 2133   GLUCOSEU NEGATIVE 11/23/2016 2133   HGBUR NEGATIVE 11/23/2016 2133   BILIRUBINUR NEGATIVE 11/23/2016 2133   KETONESUR 80 (A) 11/23/2016 2133   PROTEINUR 30 (A) 11/23/2016 2133   NITRITE NEGATIVE 11/23/2016 2133   LEUKOCYTESUR MODERATE (A) 11/23/2016 2133   Sepsis Labs: @LABRCNTIP (procalcitonin:4,lacticidven:4)  Urine culture     Status: Abnormal   Collection Time: 11/23/16 11:30 PM  Result Value Ref Range Status   Specimen Description URINE, CLEAN CATCH  Final   Special Requests NONE  Final   Culture MULTIPLE SPECIES PRESENT, SUGGEST RECOLLECTION (A)  Final   Report Status 11/25/2016 FINAL  Final  Surgical pcr screen     Status: None   Collection Time: 11/26/16  4:56 PM  Result Value Ref Range Status   MRSA, PCR NEGATIVE NEGATIVE Final   Staphylococcus aureus NEGATIVE NEGATIVE Final     Radiology Studies: Ct Abdomen Pelvis W Contrast Result Date: 11/24/2016 1. Pneumobilia status post biliary stent. Minimal debris again noted in the distal aspect of the stent. No space-occupying mass of the liver. Hepatic steatosis. 2. Punctate right upper pole renal calculus without obstruction. Mild ectasia of the right renal collecting system as before, chronic in appearance. Small left renal cyst too small to further characterize at 4 mm in the upper pole. 3. Subtle hypodensity in the uterus on the right may reflect a small intramural fibroid. 4. Marked gastric distention with fluid suspicious for gastroparesis. No small or large bowel obstruction or inflammation. 5. Spondylosis of the dorsal spine. Electronically Signed   By: Ashley Royalty M.D.   On: 11/24/2016 03:08   Dg Ugi W/high Density W/kub Result Date: 11/26/2016 Significantly dilated duodenal bulb with slow passage of contrast into the decompressed second portion of the duodenum, possibly related to stricture. Electronically Signed   By: Rolm Baptise M.D.   On: 11/26/2016 10:39   Scheduled Meds: . amLODipine  10 mg Oral QHS  . [START ON 12/01/2016] levothyroxine  25 mcg Oral QAC breakfast  . lisinopril  5 mg Oral QHS  . magnesium oxide  400 mg Oral BID  . multivitamin with minerals  1 tablet Oral Daily  . [START ON 12/01/2016] pantoprazole  40 mg Oral Daily  . potassium chloride  20 mEq Oral TID PC & HS   Continuous Infusions: . dextrose 5 % and 0.9 % NaCl with KCl 40 mEq/L       LOS: 6 days   Time spent: 25 minutes  Vance Gather, MD [facility] Pager [phone]   If 7PM-7AM, please contact night-coverage [email or web address] Password [email or web address] 11/30/2016, 4:01 PM

## 2016-11-30 NOTE — Progress Notes (Signed)
3 Days Post-Op  Subjective: CC:  Common bile duct stricture and GOO She looks great this PM.  Tolerating clears and anxious to move on up.  Say she has walked some here in the halls.  Not complaining of much pain.  +BM   Objective: Vital signs in last 24 hours: Temp:  [98.1 F (36.7 C)-98.7 F (37.1 C)] 98.1 F (36.7 C) (04/16 1508) Pulse Rate:  [80-87] 81 (04/16 1508) Resp:  [16-20] 18 (04/16 1508) BP: (141-145)/(63-70) 145/70 (04/16 1508) SpO2:  [95 %-98 %] 98 % (04/16 1508) Last BM Date: 11/30/16 780 by mouth 3125 IV Voided 8 BM. recorded yesterday and today. Afebrile vital signs are stable Potassium is 2.8 WBC is normal. Hemoglobin/hematocrit stable  Intake/Output from previous day: 04/15 0701 - 04/16 0700 In: 3905 [P.O.:780; I.V.:3125] Out: -  Intake/Output this shift: No intake/output data recorded.  General appearance: alert, cooperative and no distress Resp: clear to auscultation bilaterally GI: soft, sore, sites looks fine.  She has BS and BM's  Lab Results:   Recent Labs  11/28/16 0431 11/30/16 0408  WBC 12.5* 7.0  HGB 12.7 10.4*  HCT 36.7 30.6*  PLT 262 246    BMET  Recent Labs  11/28/16 0431 11/30/16 0408  NA 133* 136  K 3.9 2.8*  CL 97* 103  CO2 25 24  GLUCOSE 99 94  BUN 5* <5*  CREATININE 0.57 0.50  CALCIUM 8.8* 8.0*   PT/INR No results for input(s): LABPROT, INR in the last 72 hours.   Recent Labs Lab 11/23/16 2146 11/25/16 0338  AST 35 31  ALT 33 22  ALKPHOS 121 78  BILITOT 0.9 0.9  PROT 7.7 5.5*  ALBUMIN 4.6 3.1*     Lipase     Component Value Date/Time   LIPASE 273 (H) 11/25/2016 6314     Studies/Results: No results found. . sodium chloride 125 mL/hr at 11/30/16 0021   Medications: . amLODipine  10 mg Oral QHS  . levothyroxine  12.5 mcg Intravenous Daily  . lisinopril  5 mg Oral QHS  . metoCLOPramide (REGLAN) injection  5 mg Intravenous BID  . multivitamin with minerals  1 tablet Oral Daily  .  pantoprazole (PROTONIX) IV  40 mg Intravenous Daily   Prior to Admission medications   Medication Sig Start Date End Date Taking? Authorizing Provider  amLODipine (NORVASC) 10 MG tablet Take 10 mg by mouth at bedtime.  06/16/15  Yes Historical Provider, MD  hyoscyamine (LEVSIN) 0.125 MG tablet Take 1 to 2 tablets three times a day if needed for abdominal spasms 05/22/16  Yes [name], MD  levothyroxine (SYNTHROID, LEVOTHROID) 25 MCG tablet Take 25 mcg by mouth daily before breakfast.   Yes Historical Provider, MD  lisinopril (PRINIVIL,ZESTRIL) 5 MG tablet Take 5 mg by mouth at bedtime.    Yes Historical Provider, MD  Multiple Vitamins-Minerals (MULTIVITAMIN GUMMIES ADULT) CHEW Chew 1 tablet by mouth daily.   Yes Historical Provider, MD  polyethylene glycol powder (GLYCOLAX/MIRALAX) powder Take 17 g by mouth as needed for mild constipation.    Yes Historical Provider, MD  polyvinyl alcohol (LIQUIFILM TEARS) 1.4 % ophthalmic solution Place 1 drop into both eyes daily as needed for dry eyes.   Yes Historical Provider, MD  HYDROcodone-acetaminophen (NORCO/VICODIN) 5-325 MG tablet Take 2 tablets by mouth every 4 (four) hours as needed. Patient not taking: Reported on 11/23/2016 09/06/15   Noemi Chapel, MD     Assessment/Plan Gastric outlet obstruction/biliary stricture S/P gastrojejunostomy, biopsy  pancreatic head mass, 04/15/55 Dr. Leighton Ruff UTI Hypothyroid Hypokalemia Essential hypertension FEN: IV fluids/clear liquids ID: Preop only DVT:  SCDs only   Plan:  Decrease IV fluids, PT evaluation, advance diet.  Fulls now and soft diet in AM if no issues.  Switch her over to PO meds.  Pathology is still pending on the biopsy. Adding K+ and checking Mag, also.  Home next day or two.   Start Lovenox for DVT prophylaxis. She is small with get pharmacy to dose.     LOS: 6 days    Summer Jones 11/30/2016 [phone]

## 2016-11-30 NOTE — Care Management Note (Signed)
Case Management Note  Patient Details  Name: Summer Jones MRN: [id number] Date of Birth: October 31, 1929  Subjective/Objective:     83 yo admitted with Abdominal Pain. S/P GASTROJEJUNOSTOMY, BIOPSY PANCREATIC HEAD MASS.               Action/Plan: From home with spouse. Pt independent with ADLs prior to admission. CM will continue to follow and assist with DC planning as needed.  Expected Discharge Date:                  Expected Discharge Plan:  Home/Self Care  In-House Referral:     Discharge planning Services  CM Consult  Post Acute Care Choice:    Choice offered to:     DME Arranged:    DME Agency:     HH Arranged:    HH Agency:     Status of Service:  In process, will continue to follow  If discussed at Long Length of Stay Meetings, dates discussed:    Additional CommentsLynnell Catalan, RN 11/30/2016, 12:57 PM  [phone]

## 2016-11-30 NOTE — Progress Notes (Signed)
ANTICOAGULATION CONSULT NOTE - Initial Consult  Pharmacy Consult for Enoxaparin Indication: VTE prophylaxis  Allergies  Allergen Reactions  . Sulfamethoxazole Nausea Only    Patient Measurements: Height: 4\' 11"  (149.9 cm) Weight: 93 lb 4.1 oz (42.3 kg) IBW/kg (Calculated) : 43.2  Vital Signs: Temp: 98.1 F (36.7 C) (04/16 1508) Temp Source: Oral (04/16 1508) BP: 145/70 (04/16 1508) Pulse Rate: 81 (04/16 1508)  Labs:  Recent Labs  11/28/16 0431 11/30/16 0408  HGB 12.7 10.4*  HCT 36.7 30.6*  PLT 262 246  CREATININE 0.57 0.50    Estimated Creatinine Clearance: 35 mL/min (by C-G formula based on SCr of 0.5 mg/dL).   Medical History: Past Medical History:  Diagnosis Date  . Arthritis   . Common bile duct (CBD) obstruction   . Difficult intubation Anterior glottis, short TMD, limited mouth opening. Required glidescope and bougie stylet with multiple attempts.  . Diverticular disease   . Diverticulosis   . Essential hypertension   . Hypertension   . Hypothyroidism   . Jaundice   . Mitral valve prolapse   . Thyroid disease     Assessment: 54 y/oF admitted on 11/23/2016 with abdominal pain and n/v found to have common bile duct stricture and GOO. Pharmacy consulted today to dose enoxaparin for VTE prophylaxis. Patient weight documented as 42.3 kg, CrCl ~ 35 ml/min. CBC shows Hgb of 10.4, Pltc WNL. Patient currently not on any anticoagulants in the hospital or PTA. SCDs ordered.   Goal of Therapy:  Prevention of VTE Monitor platelets by anticoagulation protocol: Yes   Plan:   Enoxaparin 30mg  SQ q24h.   Monitor renal function, CBC, and for s/sx of bleeding.  Pharmacy will sign off at this time.  Please reconsult if a change in clinical status warrants re-evaluation of dosage.   Lindell Spar, PharmD, BCPS Pager: [phone] 11/30/2016 4:31 PM

## 2016-12-01 LAB — BASIC METABOLIC PANEL
Anion gap: 5 (ref 5–15)
BUN: <5 mg/dL — ABNORMAL LOW (ref 6–20)
CHLORIDE: 104 mmol/L (ref 101–111)
CO2: 28 mmol/L (ref 22–32)
Calcium: 8.1 mg/dL — ABNORMAL LOW (ref 8.9–10.3)
Creatinine, Ser: 0.42 mg/dL — ABNORMAL LOW (ref 0.44–1.00)
GFR calc non Af Amer: >60 mL/min (ref >60)
Glucose, Bld: 116 mg/dL — ABNORMAL HIGH (ref 65–99)
POTASSIUM: 3.7 mmol/L (ref 3.5–5.1)
SODIUM: 137 mmol/L (ref 135–145)

## 2016-12-01 LAB — GLUCOSE, CAPILLARY: GLUCOSE-CAPILLARY: 108 mg/dL — AB (ref 65–99)

## 2016-12-01 LAB — MAGNESIUM: MAGNESIUM: 2.1 mg/dL (ref 1.7–2.4)

## 2016-12-01 MED ORDER — TRAMADOL HCL 50 MG PO TABS: 50.0000 mg | ORAL_TABLET | Freq: Four times a day (QID) | ORAL | Status: DC | PRN

## 2016-12-01 MED ORDER — PANTOPRAZOLE SODIUM 40 MG PO TBEC: 40.0000 mg | DELAYED_RELEASE_TABLET | Freq: Every day | ORAL | 0 refills | Status: DC

## 2016-12-01 NOTE — Evaluation (Signed)
Physical Therapy Evaluation Patient Details Name: Summer Jones MRN: [id number] DOB: 1930/02/28 Today's Date: 12/01/2016   History of Present Illness  83 yo female admitted with abd pain. S/P gastrojejunostomy, biopsy, pancreas mass 4/13.   Clinical Impression  On eval, pt was Min guard assist for mobility. She walked ~150 feet without an assistive device and climbed 4 steps with the use of 1 rail. Pt is mildly unsteady at times. Discussed d/c plan-pt will return home with family. She politely declines HHPT follow up. Instructed pt to slowly increase activity as tolerated.     Follow Up Recommendations Supervision/Assistance - 24 hour;No PT follow up (pt politely declines PT follow up)    Equipment Recommendations  None recommended by PT    Recommendations for Other Services       Precautions / Restrictions Precautions Precautions: Fall Restrictions Weight Bearing Restrictions: No      Mobility  Bed Mobility Overal bed mobility: Modified Independent                Transfers Overall transfer level: Needs assistance   Transfers: Sit to/from Stand Sit to Stand: Supervision         General transfer comment: for safety  Ambulation/Gait Ambulation/Gait assistance: Min guard Ambulation Distance (Feet): 150 Feet Assistive device: None Gait Pattern/deviations: Step-through pattern;Decreased stride length;Staggering left;Staggering right;Drifts right/left     General Gait Details: unsteady at times.   Stairs Stairs: Yes Stairs assistance: Min guard Stair Management: Step to pattern;Forwards;One rail Left Number of Stairs: 4 General stair comments: close guard for safety.   Wheelchair Mobility    Modified Rankin (Stroke Patients Only)       Balance Overall balance assessment: Needs assistance           Standing balance-Leahy Scale: Good                               Pertinent Vitals/Pain Pain Assessment: No/denies pain    Home  Living Family/patient expects to be discharged to:: Private residence Living Arrangements: Spouse/significant other Available Help at Discharge: Family Type of Home: House Home Access: Stairs to enter Entrance Stairs-Rails: Right Entrance Stairs-Number of Steps: 4 Home Layout: One level Home Equipment: None      Prior Function Level of Independence: Independent               Hand Dominance        Extremity/Trunk Assessment   Upper Extremity Assessment Upper Extremity Assessment: Generalized weakness    Lower Extremity Assessment Lower Extremity Assessment: Generalized weakness    Cervical / Trunk Assessment Cervical / Trunk Assessment: Kyphotic  Communication   Communication: No difficulties  Cognition Arousal/Alertness: Awake/alert Behavior During Therapy: WFL for tasks assessed/performed Overall Cognitive Status: Within Functional Limits for tasks assessed                                        General Comments      Exercises     Assessment/Plan    PT Assessment Patient needs continued PT services  PT Problem List Decreased mobility;Decreased strength;Decreased balance;Decreased activity tolerance       PT Treatment Interventions Gait training;Therapeutic activities;Therapeutic exercise;Patient/family education;Functional mobility training;Balance training    PT Goals (Current goals can be found in the Care Plan section)  Acute Rehab PT Goals Patient Stated Goal: regain plof PT  Goal Formulation: With patient/family Time For Goal Achievement: 12/15/16 Potential to Achieve Goals: Good    Frequency Min 3X/week   Barriers to discharge        Co-evaluation               End of Session Equipment Utilized During Treatment: Gait belt Activity Tolerance: Patient tolerated treatment well Patient left: in bed;with call bell/phone within reach;with family/visitor present   PT Visit Diagnosis: Muscle weakness (generalized)  (M62.81);Difficulty in walking, not elsewhere classified (R26.2)    Time: 7530-0511 PT Time Calculation (min) (ACUTE ONLY): 10 min   Charges:   PT Evaluation $PT Eval Low Complexity: 1 Procedure     PT G Codes:          Weston Anna, MPT Pager: [phone]

## 2016-12-01 NOTE — Discharge Instructions (Signed)
[facility]      [facility] °[phone] ° °OPEN ABDOMINAL SURGERY: POST OP INSTRUCTIONS ° °Always review your discharge instruction sheet given to you by the facility where your surgery was performed. ° °IF YOU HAVE DISABILITY OR FAMILY LEAVE FORMS, YOU MUST BRING THEM TO THE OFFICE FOR PROCESSING.  PLEASE DO NOT GIVE THEM TO YOUR DOCTOR. ° °1. A prescription for pain medication may be given to you upon discharge.  Take your pain medication as prescribed, if needed.  If narcotic pain medicine is not needed, then you may take acetaminophen (Tylenol) or ibuprofen (Advil) as needed. °2. Take your usually prescribed medications unless otherwise directed. °3. If you need a refill on your pain medication, please contact your pharmacy. They will contact our office to request authorization.  Prescriptions will not be filled after 5pm or on week-ends. °4. You should follow a light diet the first few days after arrival home, such as soup and crackers, pudding, etc.unless your doctor has advised otherwise. A high-fiber, low fat diet can be resumed as tolerated.   Be sure to include lots of fluids daily. Most patients will experience some swelling and bruising on the chest and neck area.  Ice packs will help.  Swelling and bruising can take several days to resolve °5. Most patients will experience some swelling and bruising in the area of the incision. Ice pack will help. Swelling and bruising can take several days to resolve..  °6. It is common to experience some constipation if taking pain medication after surgery.  Increasing fluid intake and taking a stool softener will usually help or prevent this problem from occurring.  A mild laxative (Milk of Magnesia or Miralax) should be taken according to package directions if there are no bowel movements after 48 hours. °7.  You may have steri-strips (small skin tapes) in place directly over the incision.  These strips should be left on the skin for 7-10 days.  If your  surgeon used skin glue on the incision, you may shower in 24 hours.  The glue will flake off over the next 2-3 weeks.  Any sutures or staples will be removed at the office during your follow-up visit. You may find that a light gauze bandage over your incision may keep your staples from being rubbed or pulled. You may shower and replace the bandage daily. °8. ACTIVITIES:  You may resume regular (light) daily activities beginning the next day--such as daily self-care, walking, climbing stairs--gradually increasing activities as tolerated.  You may have sexual intercourse when it is comfortable.  Refrain from any heavy lifting or straining until approved by your doctor. °a. You may drive when you no longer are taking prescription pain medication, you can comfortably wear a seatbelt, and you can safely maneuver your car and apply brakes °b. Return to Work: ___________________________________ °9. You should see your doctor in the office for a follow-up appointment approximately two weeks after your surgery.  Make sure that you call for this appointment within a day or two after you arrive home to insure a convenient appointment time. °OTHER INSTRUCTIONS:  °_____________________________________________________________ °_____________________________________________________________ ° °WHEN TO CALL YOUR DOCTOR: °1. Fever over 101.0 °2. Inability to urinate °3. Nausea and/or vomiting °4. Extreme swelling or bruising °5. Continued bleeding from incision. °6. Increased pain, redness, or drainage from the incision. °7. Difficulty swallowing or breathing °8. Muscle cramping or spasms. °9. Numbness or tingling in hands or feet or around lips. ° °The clinic staff is available to   answer your questions during regular business hours.  Please dont hesitate to call and ask to speak to one of the nurses if you have concerns.  For further questions, please visit [email or web address]  Low-Fiber Diet for at least the next  week and then you can increase fiber.   Fiber is found in fruits, vegetables, and whole grains. A low-fiber diet restricts fibrous foods that are not digested in the small intestine. A diet containing about 10-15 grams of fiber per day is considered low fiber. Low-fiber diets may be used to:  Promote healing and rest the bowel during intestinal flare-ups.  Prevent blockage of a partially obstructed or narrowed gastrointestinal tract.  Reduce fecal weight and volume.  Slow the movement of feces. You may be on a low-fiber diet as a transitional diet following surgery, after an injury (trauma), or because of a short (acute) or lifelong (chronic) illness. Your health care provider will determine the length of time you need to stay on this diet. What do I need to know about a low-fiber diet? Always check the fiber content on the packaging's Nutrition Facts label, especially on foods from the grains list. Ask your dietitian if you have questions about specific foods that are related to your condition, especially if the food is not listed below. In general, a low-fiber food will have less than 2 g of fiber. What foods can I eat? Grains  All breads and crackers made with white flour. Sweet rolls, doughnuts, waffles, pancakes, [place] toast, bagels. Pretzels, Melba toast, zwieback. Well-cooked cereals, such as cornmeal, farina, or cream cereals. Dry cereals that do not contain whole grains, fruit, or nuts, such as refined corn, wheat, rice, and oat cereals. Potatoes prepared any way without skins, plain pastas and noodles, refined white rice. Use white flour for baking and making sauces. Use allowed list of grains for casseroles, dumplings, and puddings. Vegetables  Strained tomato and vegetable juices. Fresh lettuce, cucumber, spinach. Well-cooked (no skin or pulp) or canned vegetables, such as asparagus, bean sprouts, beets, carrots, green beans, mushrooms, potatoes, pumpkin, spinach, yellow squash, tomato  sauce/puree, turnips, yams, and zucchini. Keep servings limited to  cup. Fruits  All fruit juices except prune juice. Cooked or canned fruits without skin and seeds, such as applesauce, apricots, cherries, fruit cocktail, grapefruit, grapes, mandarin oranges, melons, peaches, pears, pineapple, and plums. Fresh fruits without skin, such as apricots, avocados, bananas, melons, pineapple, nectarines, and peaches. Keep servings limited to  cup or 1 piece. Meat and Other Protein Sources  Ground or well-cooked tender beef, ham, veal, lamb, pork, or poultry. Eggs, plain cheese. Fish, oysters, shrimp, lobster, and other seafood. Liver, organ meats. Smooth nut butters. Dairy  All milk products and alternative dairy substitutes, such as soy, rice, almond, and coconut, not containing added whole nuts, seeds, or added fruit. Beverages  Decaf coffee, fruit, and vegetable juices or smoothies (small amounts, with no pulp or skins, and with fruits from allowed list), sports drinks, herbal tea. Condiments  Ketchup, mustard, vinegar, cream sauce, cheese sauce, cocoa powder. Spices in moderation, such as allspice, basil, bay leaves, celery powder or leaves, cinnamon, cumin powder, curry powder, ginger, mace, marjoram, onion or garlic powder, oregano, paprika, parsley flakes, ground pepper, rosemary, sage, savory, tarragon, thyme, and turmeric. Sweets and Desserts  Plain cakes and cookies, pie made with allowed fruit, pudding, custard, cream pie. Gelatin, fruit, ice, sherbet, frozen ice pops. Ice cream, ice milk without nuts. Plain hard candy, honey, jelly, molasses, syrup, sugar, chocolate syrup,  gumdrops, marshmallows. Limit overall sugar intake. Fats and Oil  Margarine, butter, cream, mayonnaise, salad oils, plain salad dressings made from allowed foods. Choose healthy fats such as olive oil, canola oil, and omega-3 fatty acids (such as found in salmon or tuna) when possible. Other  Bouillon, broth, or cream soups  made from allowed foods. Any strained soup. Casseroles or mixed dishes made with allowed foods. The items listed above may not be a complete list of recommended foods or beverages. Contact your dietitian for more options.  What foods are not recommended? Grains  All whole wheat and whole grain breads and crackers. Multigrains, rye, bran seeds, nuts, or coconut. Cereals containing whole grains, multigrains, bran, coconut, nuts, raisins. Cooked or dry oatmeal, steel-cut oats. Coarse wheat cereals, granola. Cereals advertised as high fiber. Potato skins. Whole grain pasta, wild or brown rice. Popcorn. Coconut flour. Bran, buckwheat, corn bread, multigrains, rye, wheat germ. Vegetables  Fresh, cooked or canned vegetables, such as artichokes, asparagus, beet greens, broccoli, Brussels sprouts, cabbage, celery, cauliflower, corn, eggplant, kale, legumes or beans, okra, peas, and tomatoes. Avoid large servings of any vegetables, especially raw vegetables. Fruits  Fresh fruits, such as apples with or without skin, berries, cherries, figs, grapes, grapefruit, guavas, kiwis, mangoes, oranges, papayas, pears, persimmons, pineapple, and pomegranate. Prune juice and juices with pulp, stewed or dried prunes. Dried fruits, dates, raisins. Fruit seeds or skins. Avoid large servings of all fresh fruits. Meats and Other Protein Sources  Tough, fibrous meats with gristle. Chunky nut butter. Cheese made with seeds, nuts, or other foods not recommended. Nuts, seeds, legumes (beans, including baked beans), dried peas, beans, lentils. Dairy  Yogurt or cheese that contains nuts, seeds, or added fruit. Beverages  Fruit juices with high pulp, prune juice. Caffeinated coffee and teas. Condiments  Coconut, maple syrup, pickles, olives. Sweets and Desserts  Desserts, cookies, or candies that contain nuts or coconut, chunky peanut butter, dried fruits. Jams, preserves with seeds, marmalade. Large amounts of sugar and sweets.  Any other dessert made with fruits from the not recommended list. Other  Soups made from vegetables that are not recommended or that contain other foods not recommended. The items listed above may not be a complete list of foods and beverages to avoid. Contact your dietitian for more information.  This information is not intended to replace advice given to you by your health care provider. Make sure you discuss any questions you have with your health care provider. Document Released: 01/23/2002 Document Revised: 01/09/2016 Document Reviewed: 06/26/2013 Elsevier Interactive Patient Education  2017 [employer].

## 2016-12-01 NOTE — Progress Notes (Signed)
4 Days Post-Op  Subjective: She looks great, tolerating diet and having some BM's.  I told her to stick to soft diet, low fiber for at least the next week.  He incision is healing nicely and has glue.  She can shower with this .    Objective: Vital signs in last 24 hours: Temp:  [98 F (36.7 C)-99.4 F (37.4 C)] 99.4 F (37.4 C) (04/17 0442) Pulse Rate:  [78-95] 78 (04/17 0442) Resp:  [16-18] 16 (04/17 0442) BP: (113-145)/(52-71) 113/52 (04/17 0442) SpO2:  [97 %-98 %] 97 % (04/17 0442) Last BM Date: 11/30/16 240 PO recorded 400 this Am Voided x 5 BM x 2 Afebrile, VSS K+ replaced up to 3.7  Mag up to 2.1 Intake/Output from previous day: 04/16 0701 - 04/17 0700 In: 240 [P.O.:240] Out: -  Intake/Output this shift: Total I/O In: 400 [P.O.:400] Out: -   General appearance: alert, cooperative and no distress GI: soft sore, site looks great.  Tolerating soft diet, + BM.  Lab Results:   Recent Labs  11/30/16 0408  WBC 7.0  HGB 10.4*  HCT 30.6*  PLT 246    BMET  Recent Labs  11/30/16 0408 12/01/16 0343  NA 136 137  K 2.8* 3.7  CL 103 104  CO2 24 28  GLUCOSE 94 116*  BUN <5* <5*  CREATININE 0.50 0.42*  CALCIUM 8.0* 8.1*   PT/INR No results for input(s): LABPROT, INR in the last 72 hours.   Recent Labs Lab 11/25/16 0338  AST 31  ALT 22  ALKPHOS 78  BILITOT 0.9  PROT 5.5*  ALBUMIN 3.1*     Lipase     Component Value Date/Time   LIPASE 273 (H) 11/25/2016 6378     Studies/Results: No results found.  Medications: . amLODipine  10 mg Oral QHS  . enoxaparin (LOVENOX) injection  30 mg Subcutaneous Q24H  . levothyroxine  25 mcg Oral QAC breakfast  . lisinopril  5 mg Oral QHS  . multivitamin with minerals  1 tablet Oral Daily  . pantoprazole  40 mg Oral Daily  . 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 Leighton Ruff Pathology still pending UTI Hypothyroid Hypokalemia Essential hypertension FEN: IV fluids/clear liquids ID: Preop only DVT:  SCDs only   Plan:  From our standpoint she can go home.  Follow up with Dr. [name], info in the AVS.  Discharge instructions in AVS also.  She has Tylenol and Tramadol ordered for pain.  She can continue this at home.  I would let her try the Tramadol before she goes      LOS: 7 days    Marcas Bowsher 12/01/2016 [phone]

## 2016-12-01 NOTE — Discharge Summary (Signed)
Physician Discharge Summary  Summer Jones ZOX:[id number] DOB: [birth year] DOA: 11/23/2016  PCP: [name], MD  Admit date: 11/23/2016 Discharge date: 12/01/2016  Admitted From: Home Disposition: Home   Recommendations for Outpatient Follow-up:  1. Follow up with PCP in 1-2 weeks 2. Follow up with Dr. [name], GI, will be arranged in 6 - 7 weeks 3. Follow up with general surgery, Dr. [name], to be arranged in 2 weeks 4. Please obtain BMP to monitor hypokalemia (resolved at discharge) 5. Please follow up on pending pathology results from pancreatic mass biopsy.  Home Health: None Equipment/Devices: None Discharge Condition: Stable CODE STATUS: Full Diet recommendation: Small soft meals as tolerated.  Brief/Interim Summary: 83 year old female with medical history significant for CBD obstruction status post stent, hypertension, hypothyroidism who presented to [facility] with abdominal pain, nausea and vomiting. Over the past 2 weeks her abdominal pain became worse and more frequent located in the mid abdomen and left and right side. Pain was about 7 out of 10 in intensity, constant. Patient reported vomiting each time she eats or drinks. No fevers, no diarrhea. Pt has been seen by GI, Dr. [name]. Per Dr. Audelia Acton note on 09/15/16, pt had vague, masslike area in the head of the pancreas. Cytology andpathology with forcep biopsies through interventional radiology on 3 separate occasions have never shown clear malignancy,but a few 'atypical cells' were noted.  On admission, patient was hemodynamically stable. Lipase was 496, normal LFTs. There were moderate amount of leukocytes on urinalysis.CT abdomen and pelvis showed pneumobilia status post biliary stent with minimal debris in the distal aspect of the stent but no space occupying mass of the liver. GI has seen the patient in consultation. Pt underwent EGD 4/11/218 for common bile duct stricture which showed gastric outlet obstruction/duodenal  strictuere. She subsequently underwent GJ bypass 4/13 due to gastric outlet obstruction and has recovered well.   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 Edematous stenosis of the distal duodenal bulb,this is in close proximity to the known biliary stricture that presented 1 1/2 years ago with painless jaundice. 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 Suspicionp that the biliary process is progressing and causing extrinsic compression of the duodenum, evolving gastric outlet obstruction. UGI barium - significantly dilated duodenal bulb with slow passage of contrast into the decompressed second portion of the duodenum, possibly related to stricture - s/p GJ bypass 4/13, tolerating soft diet, +BM. Will discharge with instructions to follow up with GI and surgery and small volume meals. - Pain control: Has required no analgesia in past 48 hours.  UTI: Patient was started on Rocephin empirically while awaiting urine culture results - Urine culture with multiple species non-predominant: Stopped rocephin 4/12, since asymptomatic.  Hypokalemia Due to GI losses which are improving. - Supplemented and now normal.   Hypothyroidism - Continue Synthroid 25 g daily  Essential hypertension - Continue Norvasc 10 mg daily and lisinopril 5 mg at bedtime  Discharge Instructions Discharge Instructions    Discharge instructions    Complete by:  As directed    You were admitted  for a gastric ileus that had failed to resolve requiring surgery to bypass  the area (a gastro-jejunal bypass). You have recovered well and tolerated a diet, so you may be discharged with the following recommendations:  - Continue eating small volumes of food frequently.  - Take protonix once daily to suppress stomach acid - Follow up with surgery, Dr. [name] in about 2 weeks - Seek medical care if you develop severe nausea, vomiting, abdominal pain, fever, chest pain or shortness of breath.     Allergies as of 12/01/2016      Reactions   Sulfamethoxazole Nausea Only      Medication List    TAKE these medications   amLODipine 10 MG tablet Commonly known as:  NORVASC Take 10 mg by mouth at bedtime.   HYDROcodone-acetaminophen 5-325 MG tablet Commonly known as:  NORCO/VICODIN Take 2 tablets by mouth every 4 (four) hours as needed.   hyoscyamine 0.125 MG tablet Commonly known as:  LEVSIN Take 1 to 2 tablets three times a day if needed for abdominal spasms   levothyroxine 25 MCG tablet Commonly known as:  SYNTHROID, LEVOTHROID Take 25 mcg by mouth daily before breakfast.   lisinopril 5 MG tablet Commonly known as:  PRINIVIL,ZESTRIL Take 5 mg by mouth at bedtime.   MULTIVITAMIN GUMMIES ADULT Chew Chew 1 tablet by mouth daily.   pantoprazole 40 MG tablet Commonly known as:  PROTONIX Take 1 tablet (40 mg total) by mouth daily. Start taking on:  12/02/2016   polyethylene glycol powder powder Commonly known as:  GLYCOLAX/MIRALAX Take 17 g by mouth as needed for mild constipation.   polyvinyl alcohol 1.4 % ophthalmic solution Commonly known as:  LIQUIFILM TEARS Place 1 drop into both eyes daily as needed for dry eyes.      Follow-up Information    Rosario Adie., MD Follow up.   Specialty:  General Surgery Why:  Call for follow up in 2 weeks Contact information: [address] [place] [address] [phone]        [name], MD Follow up.   Specialty:  Internal Medicine Contact information: [place] [address] [phone]          Allergies  Allergen Reactions  . Sulfamethoxazole Nausea Only    Consultations:  GI, Dr. [name]  Surgery, Dr. [name]  Procedures/Studies: Ct Abdomen Pelvis W Contrast  Result Date: 11/24/2016 CLINICAL DATA:  Diffuse abdominal pain worsening over the last 2 weeks. Diarrhea earlier in the week now with nausea and vomiting for the past 24 hours. EXAM: CT ABDOMEN AND PELVIS WITH CONTRAST TECHNIQUE: Multidetector CT imaging of the abdomen and pelvis was performed using the standard protocol following bolus administration of intravenous contrast. CONTRAST:  80 cc Isovue-300 COMPARISON:  05/22/2016 FINDINGS: Lower chest: Normal size cardiac chambers. Small hiatal hernia. No pneumonic consolidation, effusion or pneumothorax. Hepatobiliary: Pneumobilia with biliary stenting and visualized. Some debris noted in the distal aspect of the stent as before. No space-occupying mass of the liver. There is mild hepatic steatosis. Pancreas: Chronic mild ductal dilatation in the pancreatic head and body likely from mass effect the stent. No space-occupying mass of the pancreas nor peripancreatic inflammation is noted. Spleen: Normal Adrenals/Urinary Tract: Stable punctate right upper pole renal calculus. Mild ectasia the right renal collecting system without obstructive uropathy, similar in appearance to prior. No space-occupying mass of kidneys. There is an upper pole left renal cyst measuring 4 mm and too small to further  characterize. Urinary bladder is physiologically distended. Stomach/Bowel: Markedly distended fluid-filled stomach question gastroparesis. No significant small nor large bowel dilatation or obstruction. No bowel inflammation is noted. Diverticulosis is identified without acute diverticulitis. Moderate retention within left colon. Vascular/Lymphatic: Aortic atherosclerosis. No enlarged abdominal or pelvic lymph nodes. Reproductive: Subtle 1 cm  hypodensity of the uterus on the right may reflect a small intramural fibroid. Calcification is seen anteriorly along the uterus. No adnexal mass. Other: No abdominal wall hernia or abnormality. No abdominopelvic ascites. Musculoskeletal: Levo scoliosis of the lumbar spine with lumbar spondylitic disc space narrowing from T12 through L3 and at L4-5 and L5-S1. Minimal grade 1 anterolisthesis L5 on S1. Broad-based disc bulges at L4-5 and L5-S1 with associated facet arthropathy. IMPRESSION: 1. Pneumobilia status post biliary stent. Minimal debris again noted in the distal aspect of the stent. No space-occupying mass of the liver. Hepatic steatosis. 2. Punctate right upper pole renal calculus without obstruction. Mild ectasia of the right renal collecting system as before, chronic in appearance. Small left renal cyst too small to further characterize at 4 mm in the upper pole. 3. Subtle hypodensity in the uterus on the right may reflect a small intramural fibroid. 4. Marked gastric distention with fluid suspicious for gastroparesis. No small or large bowel obstruction or inflammation. 5. Spondylosis of the dorsal spine. Electronically Signed   By: Ashley Royalty M.D.   On: 11/24/2016 03:08   Dg Ugi W/high Density W/kub  Result Date: 11/26/2016 CLINICAL DATA:  Duodenal stricture. Assess for gastric outlet obstruction. EXAM: UPPER GI SERIES WITH KUB TECHNIQUE: After obtaining a scout radiograph a routine upper GI series was performed using thin barium FLUOROSCOPY TIME:  Fluoroscopy Time:  4.8 minutes Radiation Exposure Index (if provided by the fluoroscopic device): 39.4 mGy Number of Acquired Spot Images: 0 COMPARISON:  CT 11/24/2016 FINDINGS: Scout image of the abdomen shows nonobstructive bowel gas pattern. Metallic biliary stent is in place. Fluoroscopic evaluation of swallowing demonstrates mild mucosal ring in the distal esophagus. Otherwise no fixed stricture, fold thickening or mass. The stomach is elongated.  There is prompt emptying of the stomach into a dilated duodenal bulb, with very slow emptying from the duodenal bulb into the decompressed second portion of the duodenum. Over time, very slow passage of contrast from the dilated duodenal bulb into the decompressed second portion of the duodenum. Reflux of contrast into the biliary stent. IMPRESSION: Significantly dilated duodenal bulb with slow passage of contrast into the decompressed second portion of the duodenum, possibly related to stricture. Electronically Signed   By: Rolm Baptise M.D.   On: 11/26/2016 10:39   - EGD 11/25/2016: - Mild peptic GE junction stricture, dilated with scope passage. - Edematous stenosis of the distal duodenal bulb, this is in close proximity to the known biliary stricture that presented 1 1/2 years ago with painless jaundice. 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 I am suspicious that the biliary process is progressing and causing extrinsic compression of the duodenum, evolving gastric outlet obstruction. I'm going to order UGI barium examination to undertand the duodenal stenosis better and I'm asking general surgery to see her. She may benefit from gastrojejenostomy.  - GJ bypass 4/13 - NG tube 4/13 > 4/15  Subjective: Pt feels well, tolerating soft diet, ambulating freely in hallways, minimal abd pain not requiring any pain medications, no nausea or emesis. +BM. Wants to  go home.  Discharge Exam: BP (!) 113/52 (BP Location: Left Arm)   Pulse 78   Temp 99.4 F (37.4 C) (Oral)   Resp 16   Ht 4\' 11"  (1.499 m)   Wt 42.3 kg (93 lb 4.1 oz)   SpO2 97%   BMI 18.84 kg/m   General: Pleasant, elderly female in no distress Cardiovascular: RRR, S1/S2 +, no rubs, no gallops Respiratory: CTA bilaterally, no wheezing, no rhonchi Abdominal: Soft, NT, ND, bowel sounds +. Dressing  c/d/i Extremities: No edema, no cyanosis  Labs: Basic Metabolic Panel:  Recent Labs Lab 11/26/16 0420 11/27/16 0409 11/28/16 0431 11/30/16 0408 11/30/16 1533 12/01/16 0343  NA 141 140 133* 136  --  137  K 3.1* 3.1* 3.9 2.8*  --  3.7  CL 109 107 97* 103  --  104  CO2 27 26 25 24   --  28  GLUCOSE 98 103* 99 94  --  116*  BUN <5* <5* 5* <5*  --  <5*  CREATININE 0.49 0.54 0.57 0.50  --  0.42*  CALCIUM 8.3* 8.7* 8.8* 8.0*  --  8.1*  MG  --   --   --   --  1.5* 2.1   Liver Function Tests:  Recent Labs Lab 11/25/16 0338  AST 31  ALT 22  ALKPHOS 78  BILITOT 0.9  PROT 5.5*  ALBUMIN 3.1*    Recent Labs Lab 11/25/16 0338  LIPASE 273*   CBC:  Recent Labs Lab 11/25/16 0338 11/26/16 0420 11/27/16 0409 11/28/16 0431 11/30/16 0408  WBC 4.5 4.6 5.8 12.5* 7.0  HGB 10.7* 10.4* 11.7* 12.7 10.4*  HCT 31.2* 30.4* 34.2* 36.7 30.6*  MCV 91.5 91.0 90.5 87.6 87.4  PLT 245 253 263 262 246   CBG:  Recent Labs Lab 11/27/16 0800 11/28/16 0730 11/29/16 0744 11/30/16 0848 12/01/16 0733  GLUCAP 94 85 67 114* 108*   Urinalysis    Component Value Date/Time   COLORURINE YELLOW 11/23/2016 2133   APPEARANCEUR HAZY (A) 11/23/2016 2133   LABSPEC 1.020 11/23/2016 2133   PHURINE 5.0 11/23/2016 2133   GLUCOSEU NEGATIVE 11/23/2016 2133   HGBUR NEGATIVE 11/23/2016 2133   BILIRUBINUR NEGATIVE 11/23/2016 2133   KETONESUR 80 (A) 11/23/2016 2133   PROTEINUR 30 (A) 11/23/2016 2133   NITRITE NEGATIVE 11/23/2016 2133   LEUKOCYTESUR MODERATE (A) 11/23/2016 2133    Microbiology Recent Results (from the past 240 hour(s))  Urine culture     Status: Abnormal   Collection Time: 11/23/16 11:30 PM  Result Value Ref Range Status   Specimen Description URINE, CLEAN CATCH  Final   Special Requests NONE  Final   Culture MULTIPLE SPECIES PRESENT, SUGGEST RECOLLECTION (A)  Final   Report Status 11/25/2016 FINAL  Final  Surgical pcr screen     Status: None   Collection Time: 11/26/16  4:56  PM  Result Value Ref Range Status   MRSA, PCR NEGATIVE NEGATIVE Final   Staphylococcus aureus NEGATIVE NEGATIVE Final    Comment:        The Xpert SA Assay (FDA approved for NASAL specimens in patients over 46 years of age), is one component of a comprehensive surveillance program.  Test performance has been validated by [facility] for patients greater than or equal to 69 year old. It is not intended to diagnose infection nor to guide or monitor treatment.     Time coordinating discharge: Approximately 40 minutes  Vance Gather, MD  [facility] 12/01/2016, 12:35 PM Pager [phone]

## 2016-12-01 NOTE — Progress Notes (Signed)
Discharge instructions reviewed with patient and husband. Both verbalized understanding. Patient to be  Discharged via private vehicle.

## 2016-12-26 ENCOUNTER — Encounter: Payer: Self-pay | Admitting: Hematology

## 2016-12-26 ENCOUNTER — Telehealth: Payer: Self-pay | Admitting: Hematology

## 2016-12-26 NOTE — Telephone Encounter (Signed)
Attempted to contact the pt, but no answer on either home or mobile#s. Pt's vm on mobile has not been set up. Pt has been scheduled to see Dr. Burr Medico on 5/22 at 11am per MD. Letter mailed.

## 2016-12-28 ENCOUNTER — Telehealth: Payer: Self-pay | Admitting: Hematology

## 2016-12-28 NOTE — Telephone Encounter (Signed)
Pt cld to cancel appt at this time. She wanted to talk to Dr. [name] before she reschedules her appt. She will give me a call back once she has made a decision. Referral canceled.

## 2017-01-05 ENCOUNTER — Ambulatory Visit: Payer: Medicare Other | Admitting: Hematology

## 2017-01-07 ENCOUNTER — Ambulatory Visit: Payer: Medicare Other | Admitting: Hematology

## 2017-01-12 ENCOUNTER — Ambulatory Visit (INDEPENDENT_AMBULATORY_CARE_PROVIDER_SITE_OTHER): Payer: Medicare Other | Admitting: Gastroenterology

## 2017-01-12 ENCOUNTER — Encounter: Payer: Self-pay | Admitting: Gastroenterology

## 2017-01-12 VITALS — BP 110/60 | HR 72 | Ht 59.0 in | Wt 90.0 lb

## 2017-01-12 DIAGNOSIS — K831 Obstruction of bile duct: Secondary | ICD-10-CM

## 2017-01-12 DIAGNOSIS — R627 Adult failure to thrive: Secondary | ICD-10-CM | POA: Diagnosis not present

## 2017-01-12 NOTE — Progress Notes (Signed)
Review of pertinent gastrointestinal problems: 1. Common bile duct stricture presented with painless jaundice, weight loss; December 2016, total bilirubin max 16.6. Initial CT scan showed no clear pancreatic mass. She underwent endoscopic ultrasound and ERCP, Dr. [name]. This was complicated by a very small mouth inability to pass the linear echoendoscope. Vague, masslike area in the head of the pancreas was noted. ERCP was unsuccessful as well, inability to pass a very tight distal bile duct stricture with the wire. Eventual percutaneous internal/external drain placed by interventional radiology. Cytology, pathology with forcep biopsies through interventional radiology on 3 separate occasions have never shown clear malignancy but a few 'atypical cells' were noted. Most recent sample of the CBD stricture was 09/05/2015. At that time fully removable, covered biliary stent was placed by interventional radiology. The int/ext drain was removed about a week later. TBili was 09/05/2015, was 2.0. CA 19-9 about 100. ERCP 12/2015 Dr. [name], removed previously placed IR stent, persistent but mild mid-CBD stricture was brushed, "no malignant cells," incomplete Spy Glass evaluation was negative, placed 6cm long 8mm diameter fully covered SEMS.  CT scan 05/2016: CBD stent in place, no pancreatic masses. 11/2016: Gastric outlet obstruction, partial but developing. She underwent gastrojejunostomy by Dr. Leighton Ruff April 5056, at the same time needle biopsies were performed from what appeared to be a pancreatic mass. This was "suspicous for adenocarcinoma".  HPI: This is a  very pleasant 83 year old woman whom I last saw when she was hospitalized at [facility] 6 or 8 weeks ago. Chief complaint is poor appetite  Eating without nausea, vomiting.    She is eating eggs, protein bars.    Doesn't eat a lot though.  Went to see Dr. [name] after d/c  She is not interested in referral to oncology.  She was set  for an appointment and actually cancelled it after a lot of thought.  ROS: complete GI ROS as described in HPI, all other review negative.  Constitutional:  No unintentional weight loss   Past Medical History:  Diagnosis Date  . Arthritis   . Common bile duct (CBD) obstruction   . Difficult intubation Anterior glottis, short TMD, limited mouth opening. Required glidescope and bougie stylet with multiple attempts.  . Diverticular disease   . Diverticulosis   . Essential hypertension   . Hypertension   . Hypothyroidism   . Jaundice   . Mitral valve prolapse   . Thyroid disease     Past Surgical History:  Procedure Laterality Date  . BREAST BIOPSY    . BREAST CYST EXCISION    . ENDOSCOPIC RETROGRADE CHOLANGIOPANCREATOGRAPHY (ERCP) WITH PROPOFOL N/A 08/08/2015   Procedure: ENDOSCOPIC RETROGRADE CHOLANGIOPANCREATOGRAPHY (ERCP) WITH PROPOFOL;  Surgeon: [name], MD;  Location: [facility];  Service: Endoscopy;  Laterality: N/A;  . 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 ESOPHAGOGASTRODUODENOSCOPY (EGD) WITH PROPOFOL N/A 11/25/2016   Procedure: ESOPHAGOGASTRODUODENOSCOPY (EGD) WITH PROPOFOL;  Surgeon: [name], MD;  Location: [facility];  Service: Endoscopy;  Laterality: N/A;  . EUS N/A 08/08/2015   Procedure: UPPER ENDOSCOPIC ULTRASOUND (EUS) RADIAL;  Surgeon: [name], MD;  Location: [facility];  Service: Endoscopy;  Laterality: N/A;  . INGUINAL HERNIA REPAIR     right  . LAPAROTOMY N/A 11/27/2016   Procedure: GASTROJEJUNOSTOMY;  Surgeon: Elmo Putt  [name], MD;  Location: [facility];  Service: General;  Laterality: N/A;  . PANCREAS BIOPSY  11/27/2016   Procedure: PANCREACTIC BIOPSY;  Surgeon: Leighton Ruff, MD;   Location: [facility];  Service: General;;  . Bess Kinds CHOLANGIOSCOPY N/A 12/26/2015   Procedure: [id number] CHOLANGIOSCOPY;  Surgeon: [name], MD;  Location: [facility];  Service: Endoscopy;  Laterality: N/A;  . TONSILLECTOMY      Current Outpatient Prescriptions  Medication Sig Dispense Refill  . amLODipine (NORVASC) 10 MG tablet Take 10 mg by mouth at bedtime.     Marland Kitchen levothyroxine (SYNTHROID, LEVOTHROID) 25 MCG tablet Take 25 mcg by mouth daily before breakfast.    . lisinopril (PRINIVIL,ZESTRIL) 5 MG tablet Take 5 mg by mouth at bedtime.     . Multiple Vitamins-Minerals (MULTIVITAMIN GUMMIES ADULT) CHEW Chew 1 tablet by mouth daily.    . polyethylene glycol powder (GLYCOLAX/MIRALAX) powder Take 17 g by mouth as needed for mild constipation.     . polyvinyl alcohol (LIQUIFILM TEARS) 1.4 % ophthalmic solution Place 1 drop into both eyes daily as needed for dry eyes.     No current facility-administered medications for this visit.     Allergies as of 01/12/2017 - Review Complete 01/12/2017  Allergen Reaction Noted  . Sulfamethoxazole Nausea Only 08/13/2015    Family History  Problem Relation Age of Onset  . Breast cancer Sister   . Heart disease Father     Social History   Social History  . Marital status: Married    Spouse name: N/A  . Number of children: 2  . Years of education: N/A   Occupational History  . Retired    Social History Main Topics  . Smoking status: Never Smoker  . Smokeless tobacco: Never Used  . Alcohol use No     Comment: none since Thanksgiving  . Drug use: No  . Sexual activity: Not on file   Other Topics Concern  . Not on file   Social History Narrative  . No narrative on file     Physical Exam: BP 110/60   Pulse 72   Ht 4\' 11"  (1.499 m)   Wt 90 lb (40.8 kg)   BMI 18.18 kg/m  Constitutional: generally well-appearing Psychiatric: alert and oriented x3 Abdomen: soft, nontender, nondistended, no obvious ascites, no peritoneal  signs, normal bowel sounds No peripheral edema noted in lower extremities  Assessment and plan: 83 y.o. 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 We have not proven malignancy despite multiple brushings, FNA's, surgical needles. She is not interested in medical oncology referral. I have offered this previously. She was asked rescheduled quite recently to see someone but canceled that after thoughtful discussion with her sons and husband. We will simply follow her clinically. I recommended she try to gain some weight with nutritious drinks such as ensure twice daily. She will return to see me in 5 or 6 months and sooner if needed.  Please see the "Patient Instructions" section for addition details about the plan.  Owens Loffler, MD [facility] 01/12/2017, 1:48 PM

## 2017-01-12 NOTE — Patient Instructions (Signed)
Try to drink ensure; two cans per day. Please return to see Dr. [name] in 5-6 months. Call sooner if needed.

## 2017-01-18 ENCOUNTER — Telehealth: Payer: Self-pay

## 2017-01-18 NOTE — Addendum Note (Signed)
Addendum  created 01/18/17 1310 by Myrtie Soman, MD   Sign clinical note

## 2017-01-18 NOTE — Anesthesia Postprocedure Evaluation (Signed)
Anesthesia Post Note  Patient: Summer Jones  Procedure(s) Performed: Procedure(s) (LRB): ESOPHAGOGASTRODUODENOSCOPY (EGD) WITH PROPOFOL (N/A)     Anesthesia Post Evaluation  Last Vitals:  Vitals:   12/01/16 0442 12/01/16 1420  BP: (!) 113/52 134/77  Pulse: 78 78  Resp: 16 17  Temp: 37.4 C 36.8 C    Last Pain:  Vitals:   12/01/16 1420  TempSrc: Oral  PainSc:                  Ahmod Gillespie S

## 2017-01-18 NOTE — Telephone Encounter (Signed)
Status post interventional radiology permanent metal stent, status post gastrojejunostomy surgically to bypass developing gastric outlet obstruction all related to presumed pancreatic or biliary malignancy. We have not proven malignancy despite multiple brushings, FNA's, surgical needles. She is not interested in medical oncology referral. I have offered this previously. She was asked rescheduled quite recently to see someone but canceled that after thoughtful discussion with her sons and husband. We will simply follow her clinically. I recommended she try to gain some weight with nutritious drinks such as ensure twice daily. She will return to see me in 5 or 6 months and sooner if needed.

## 2017-01-18 NOTE — Telephone Encounter (Signed)
-----   Message from Barron Alvine, RN sent at 09/15/2016  3:33 PM EST ----- CT scan abdomen in June 2018 (follow up of bile duct stricture, check for pancreatic masses)

## 2017-03-02 ENCOUNTER — Telehealth: Payer: Self-pay | Admitting: Gastroenterology

## 2017-03-02 NOTE — Telephone Encounter (Signed)
The pt had surgery in April this year "gastrojejunostomy by Dr. Leighton Ruff " and since the surgery there is pain in the abd most days.  Very sore.  Pt has called [facility] to inform Dr [name].  I will also send to Dr [name] for recommendations.  She is taking hyoscyamine and it does help some. Please advise

## 2017-03-03 NOTE — Telephone Encounter (Signed)
She has a cancer (never completely proven but last biopsy during her bypass several months ago were "suspicious for adenocarcinoma."  Ask if she has pain meds and offer vicodin 325/7.5 prescriptions 50 pills, take one to two every 6 hours as needed for pain. Also offer her my next available rov and put her on wait list for sooner appt.  Would consider CT scan depending on how she looks, feels.

## 2017-03-03 NOTE — Telephone Encounter (Signed)
Line rings busy  

## 2017-03-03 NOTE — Telephone Encounter (Signed)
It's probably pain from her tumor.  LEts get CT scan abd/pelvis with IV and oral contrast to check for interval changes.

## 2017-03-03 NOTE — Telephone Encounter (Signed)
Spoke with pt and she states that the pain is better today and does not feel that the pain is bad enough for vicodin, she does not want that. States that she is always conscious of tenderness in that area of her abdomen. She states the discomfort started about a month ago. Pt is asking if any other testing needs to be done. Please advise.

## 2017-03-05 NOTE — Telephone Encounter (Signed)
Left message on machine to call back  

## 2017-03-09 NOTE — Telephone Encounter (Signed)
Unable to reach pt letter mailed 

## 2017-03-10 ENCOUNTER — Other Ambulatory Visit: Payer: Self-pay | Admitting: Obstetrics & Gynecology

## 2017-03-10 DIAGNOSIS — R921 Mammographic calcification found on diagnostic imaging of breast: Secondary | ICD-10-CM

## 2017-04-14 ENCOUNTER — Ambulatory Visit
Admission: RE | Admit: 2017-04-14 | Discharge: 2017-04-14 | Disposition: A | Discharge status: Home / Self Care | Payer: Medicare Other | Source: Ambulatory Visit | Attending: Obstetrics & Gynecology | Admitting: Obstetrics & Gynecology

## 2017-04-14 DIAGNOSIS — R921 Mammographic calcification found on diagnostic imaging of breast: Secondary | ICD-10-CM

## 2017-04-16 ENCOUNTER — Telehealth: Payer: Self-pay | Admitting: Gastroenterology

## 2017-04-16 NOTE — Telephone Encounter (Signed)
Spoke to patient, she has had increasing abdominal pain and wanted to come to see, if possible Dr. [name]. I do no see any available appointments with him, she is willing to come in an see APP. Scheduled for first available on 04/20/17 with Colletta [place].

## 2017-04-20 ENCOUNTER — Encounter: Payer: Self-pay | Admitting: Nurse Practitioner

## 2017-04-20 ENCOUNTER — Other Ambulatory Visit (INDEPENDENT_AMBULATORY_CARE_PROVIDER_SITE_OTHER): Payer: Medicare Other

## 2017-04-20 ENCOUNTER — Ambulatory Visit (INDEPENDENT_AMBULATORY_CARE_PROVIDER_SITE_OTHER): Payer: Medicare Other | Admitting: Nurse Practitioner

## 2017-04-20 VITALS — BP 118/56 | HR 64 | Ht 59.0 in | Wt 87.0 lb

## 2017-04-20 DIAGNOSIS — K8689 Other specified diseases of pancreas: Secondary | ICD-10-CM

## 2017-04-20 DIAGNOSIS — R1084 Generalized abdominal pain: Secondary | ICD-10-CM | POA: Diagnosis not present

## 2017-04-20 DIAGNOSIS — K869 Disease of pancreas, unspecified: Secondary | ICD-10-CM

## 2017-04-20 LAB — COMPREHENSIVE METABOLIC PANEL
ALBUMIN: 4.7 g/dL (ref 3.5–5.2)
ALK PHOS: 71 U/L (ref 39–117)
ALT: 18 U/L (ref 0–35)
AST: 23 U/L (ref 0–37)
BUN: 10 mg/dL (ref 6–23)
CALCIUM: 10 mg/dL (ref 8.4–10.5)
CO2: 24 mEq/L (ref 19–32)
CREATININE: 0.63 mg/dL (ref 0.40–1.20)
Chloride: 97 mEq/L (ref 96–112)
GFR: 95.45 mL/min (ref >60.00)
Glucose, Bld: 113 mg/dL — ABNORMAL HIGH (ref 70–99)
POTASSIUM: 4.7 meq/L (ref 3.5–5.1)
SODIUM: 132 meq/L — AB (ref 135–145)
TOTAL PROTEIN: 7.3 g/dL (ref 6.0–8.3)
Total Bilirubin: 0.6 mg/dL (ref 0.2–1.2)

## 2017-04-20 MED ORDER — HYOSCYAMINE SULFATE 0.125 MG PO TABS: 0.1250 mg | ORAL_TABLET | Freq: Three times a day (TID) | ORAL | 1 refills | Status: DC | PRN

## 2017-04-20 NOTE — Progress Notes (Signed)
HPI: Patient is an 83 year old female known to Dr. [name]. He has followed her over the last couple of years for suspected pancreatic cancer. She presented in 2016 with a total bilirubin of around 16. No clear-cut pancreatic mass on initial CT scan. Endoscopic ultrasound attempted but could not be done due to patient's small mouth / inability to pass the echoendoscope. ERCP was unsuccessful as well, unable to pass a very tight distal bile duct stricture with the wire. A vague mass like area was noted in the head of the pancreas. She had a percutaneous internal/external drain placed by IR. Cytology/pathology  on 3 separate occasions never showed a clear cut malignancy but rather a few atypical cells. In January 2017 IR placed a removable, covered biliary stent. The internal/external drain was removed a week later. ERCP with removal of previously placed IR stent May 2017. At that time a mild but persistent mid CBD stricture was brushed, again no malignant cells. Spyglass evaluation was negative. A 6 cm long fully covered SEMS was placed. CT scan October 2017, still no pancreatic mass. She developed GOO late April, underwent gastrojejunostomy by Leighton Ruff. At that time needle biopsies done of what appeared to be a pancreatic mass. Path "suspicious for adenocarcinoma". She has not bee interested in Oncology referral so plan has been to follow clinically.   Patient comes in today with generalized abdominal pain. She wonders if pain is related to constipation.  She has chronic constipation, usually manages with prune juice and Miralax hasn't needed either in last few months. Two weeks ago she restarted her bowel regimen and it seemed to help but not resolve the pain. No nausea / vomiting. Pain is generalized, almost constant but is definitely worse if she eats. Took hyoscymaine yesterday evening and was able to sleep. Felt great this am but pain has gradually come back. No urinary symptoms. No fevers /  chills  Past Medical History:  Diagnosis Date  . Arthritis   . Common bile duct (CBD) obstruction   . Difficult intubation Anterior glottis, short TMD, limited mouth opening. Required glidescope and bougie stylet with multiple attempts.  . Diverticular disease   . Diverticulosis   . Essential hypertension   . Hypertension   . Hypothyroidism   . Jaundice   . Mitral valve prolapse   . Thyroid disease     Patient's surgical history, family medical history, social history, medications and allergies were all reviewed in Epic    Physical Exam: BP (!) 118/56 (BP Location: Left Arm, Patient Position: Sitting, Cuff Size: Normal)   Pulse 64   Ht 4\' 11"  (1.499 m)   Wt 87 lb (39.5 kg)   BMI 17.57 kg/m   GENERAL: thin white female in NAD PSYCH: :Pleasant, cooperative, normal affect EENT:  conjunctiva pink, mucous membranes moist, neck supple without masses CARDIAC:  RRR, no peripheral edema PULM: Normal respiratory effort, lungs CTA bilaterally, no wheezing ABDOMEN:  soft, nondistended, normal bowel sounds. RUQ tender to palpation where there is an area of fullness  SKIN:  turgor, no lesions seen NEURO: Alert and oriented x 3, no focal neurologic deficits   ASSESSMENT and PLAN:  1. Pleasant 83 year old female followed over last two years for suspected pancreatic cancer. No definite mass on CTscan, cytology / path negative for malignancy. She is s/p gastrojejunostomy mid April after presenting with a GOO. Source of the obstruction appeared to be a mass posterior to duodenum. Needle biopsies taken but demonstrated only "atypical  cells". She has a metal biliary stent in place by IR for CBD stricture.   2. Generalized abdominal pain similar to what she has had in the past. No nausea. She thought pain was from constipation but bowels now moving okay with miralax and prune juice.  -obtain CT scan with pancreatic protocol . Check renal function and labs first. She declined pain medication, has  some at home anyway if she needs it. Hyoscyamine helps, she prefers to take that.   Tye Savoy , NP 04/20/2017, 2:38 PM

## 2017-04-20 NOTE — Patient Instructions (Addendum)
If you are age 81 or older, your body mass index should be between 23-30. Your Body mass index is 17.57 kg/m. If this is out of the aforementioned range listed, please consider follow up with your Primary Care Provider.  If you are age 60 or younger, your body mass index should be between 19-25. Your Body mass index is 17.57 kg/m. If this is out of the aformentioned range listed, please consider follow up with your Primary Care Provider.   Your physician has requested that you go to the basement for the following lab work before leaving today:  CMET  You have been scheduled for a CT scan of the abdomen and pelvis at [facility] ([address]---this is in the same building as Press [facility]).   You are scheduled on 04/27/17 at 4:00pm. You should arrive 30 minutes prior to your appointment time for registration. Please follow the written instructions below on the day of your exam:  WARNING: IF YOU ARE ALLERGIC TO IODINE/X-RAY DYE, PLEASE NOTIFY RADIOLOGY IMMEDIATELY AT [phone]! YOU WILL BE GIVEN A 13 HOUR PREMEDICATION PREP.  1) Do not eat or drink anything after 12:00pm (4 hours prior to your test) You may have liquids only after 12pm. 2) You will drink water when you get to the appointment.  You may take any medications as prescribed with a small amount of water except for the following: Metformin, Glucophage, Glucovance, Avandamet, Riomet, Fortamet, Actoplus Met, Janumet, Glumetza or Metaglip. The above medications must be held the day of the exam AND 48 hours after the exam.  This test typically takes 30-45 minutes to complete.  If you have any questions regarding your exam or if you need to reschedule, you may call the CT department at [phone] between the hours of 8:00 am and 5:00 pm, Monday-Friday.  ________________________________________________________________________  Summer Jones may use Hyosyamine three times daily as needed.  Summer Jones will call you with CT  results.  Thank you.

## 2017-04-23 ENCOUNTER — Ambulatory Visit (INDEPENDENT_AMBULATORY_CARE_PROVIDER_SITE_OTHER)
Admission: RE | Admit: 2017-04-23 | Discharge: 2017-04-23 | Disposition: A | Discharge status: Home / Self Care | Payer: Medicare Other | Source: Ambulatory Visit | Attending: Nurse Practitioner | Admitting: Nurse Practitioner

## 2017-04-23 ENCOUNTER — Telehealth: Payer: Self-pay | Admitting: Nurse Practitioner

## 2017-04-23 DIAGNOSIS — R1084 Generalized abdominal pain: Secondary | ICD-10-CM

## 2017-04-23 MED ORDER — IOPAMIDOL (ISOVUE-300) INJECTION 61%: 100.0000 mL | Freq: Once | INTRAVENOUS | Status: DC | PRN

## 2017-04-26 ENCOUNTER — Telehealth: Payer: Self-pay | Admitting: Nurse Practitioner

## 2017-04-26 NOTE — Telephone Encounter (Signed)
Patient has seen her report in the patient portal. She is aware. She declines any pain medication. Has vicodin 5/325 in her home from 2017. Presently is her pain wakes her up, she can change position until she gets comfortable again. Discussed constipation. Discussed very briefly that Palliative Care and Hospice are very helpful options. She is considering Oncology as it has been mentioned before. Presently she would like to talk with Dr [name] about "what now."

## 2017-04-27 ENCOUNTER — Other Ambulatory Visit: Payer: Medicare Other

## 2017-04-28 ENCOUNTER — Other Ambulatory Visit: Payer: Self-pay

## 2017-04-28 ENCOUNTER — Other Ambulatory Visit (INDEPENDENT_AMBULATORY_CARE_PROVIDER_SITE_OTHER): Payer: Medicare Other

## 2017-04-28 DIAGNOSIS — E871 Hypo-osmolality and hyponatremia: Secondary | ICD-10-CM | POA: Diagnosis not present

## 2017-04-28 DIAGNOSIS — K8689 Other specified diseases of pancreas: Secondary | ICD-10-CM

## 2017-04-28 LAB — BASIC METABOLIC PANEL
BUN: 9 mg/dL (ref 6–23)
CHLORIDE: 95 meq/L — AB (ref 96–112)
CO2: 26 mEq/L (ref 19–32)
CREATININE: 0.66 mg/dL (ref 0.40–1.20)
Calcium: 10.5 mg/dL (ref 8.4–10.5)
GFR: 90.46 mL/min (ref >60.00)
GLUCOSE: 108 mg/dL — AB (ref 70–99)
POTASSIUM: 4 meq/L (ref 3.5–5.1)
Sodium: 130 mEq/L — ABNORMAL LOW (ref 135–145)

## 2017-04-28 NOTE — Telephone Encounter (Signed)
We spoke today, my office will be arranging medical oncology referarral.

## 2017-04-28 NOTE — Telephone Encounter (Signed)
Patient given results

## 2017-05-03 ENCOUNTER — Telehealth: Payer: Self-pay | Admitting: Gastroenterology

## 2017-05-03 DIAGNOSIS — C250 Malignant neoplasm of head of pancreas: Secondary | ICD-10-CM | POA: Insufficient documentation

## 2017-05-03 NOTE — Telephone Encounter (Signed)
The pt is aware that Oncology will call her with an appt this week.  I spoke with Seth Bake and she will contact the pt

## 2017-05-04 ENCOUNTER — Other Ambulatory Visit: Payer: Self-pay

## 2017-05-04 DIAGNOSIS — E871 Hypo-osmolality and hyponatremia: Secondary | ICD-10-CM

## 2017-05-05 ENCOUNTER — Telehealth: Payer: Self-pay | Admitting: Hematology

## 2017-05-05 ENCOUNTER — Ambulatory Visit (HOSPITAL_BASED_OUTPATIENT_CLINIC_OR_DEPARTMENT_OTHER): Payer: Medicare Other | Admitting: Hematology

## 2017-05-05 ENCOUNTER — Encounter: Payer: Self-pay | Admitting: Hematology

## 2017-05-05 DIAGNOSIS — Z803 Family history of malignant neoplasm of breast: Secondary | ICD-10-CM

## 2017-05-05 DIAGNOSIS — R5383 Other fatigue: Secondary | ICD-10-CM

## 2017-05-05 DIAGNOSIS — R109 Unspecified abdominal pain: Secondary | ICD-10-CM

## 2017-05-05 DIAGNOSIS — K5909 Other constipation: Secondary | ICD-10-CM | POA: Diagnosis not present

## 2017-05-05 DIAGNOSIS — K869 Disease of pancreas, unspecified: Secondary | ICD-10-CM | POA: Diagnosis not present

## 2017-05-05 DIAGNOSIS — Z87891 Personal history of nicotine dependence: Secondary | ICD-10-CM

## 2017-05-05 DIAGNOSIS — E039 Hypothyroidism, unspecified: Secondary | ICD-10-CM | POA: Diagnosis not present

## 2017-05-05 DIAGNOSIS — R05 Cough: Secondary | ICD-10-CM

## 2017-05-05 DIAGNOSIS — C259 Malignant neoplasm of pancreas, unspecified: Secondary | ICD-10-CM

## 2017-05-05 NOTE — Telephone Encounter (Signed)
Gave patient avs and calendar with appts per 9/19 los °

## 2017-05-05 NOTE — Progress Notes (Signed)
[facility]  [phone] [phone]  Clinic New Consult Note   Patient Care Team: Crist Infante, MD as PCP - General (Internal Medicine) 05/05/2017  CHIEF COMPLAINTS/PURPOSE OF CONSULTATION:  Pancreas mass suspicious for pancreatic adenocarcinoma.    Pancreatic adenocarcinoma ([place] [facility])   08/06/2015 Imaging    CT ABD/PELVIS IMPRESSION: 1. Intra and extrahepatic biliary dilatation with an abrupt cut off of the common bile duct near its entry into the pancreatic head. Findings most suspicious for a biliary tumor (cholangiocarcinoma) but a common bile duct stone and pancreatic lesion are also possible. MRCP may be helpful for further evaluation if the patient can hold her breath. Otherwise, ERCP is suggested for both diagnostic and therapeutic purposes. 2. Moderate distention of the gallbladder without obvious gallstones. 3. No liver lesions or abdominal lymphadenopathy.       08/08/2015 Procedure    EUS and ERCP per Dr. [name]: indicated for painless juandice, dilated biliary tree  Very vaguely abnormal region in the pancreatic head. This was hypoechoic and SOMEWHAT masslike, measured 2cm Maximally.  2. CBD was dilated up to 48mm, tapered abruptly in the head of pancreas. 3. Main pancreatic duct was non-dilated 4. No peripancreatic adenopathy.      08/08/2015 Pathology Results    EUS path: Common Bile Duct , bx SMALL FRAGMENT OF BILE DUCT EPITHELIUM, PERIDUCTAL GLANDS AND FIBROMUSCULAR TISSUE NEGATIVE FOR MALIGNANCY      08/08/2015 Procedure    IR biliary stent placement       09/05/2015 Pathology Results    1. Soft tissue, biopsy, common bile duct - SCANT SOFT TISSUE AND NECROSIS WITH ACUTE INFLAMMATION. - SCANT BENIGN GLANDULAR EPITHLEIUM. - NO MALIGNANCY IDENTIFIED. 2. Soft tissue, biopsy, common bile duct - SOFT TISSUE AND NECROSIS WITH ACUTE INFLAMMATION. - BENIGN PANCREATIC TISSUE. - NO MALIGNANCY IDENTIFIED.      09/05/2015 Imaging    CT ABD/PELVIS IMPRESSION: 1. No acute abnormality seen to explain the patient's symptoms. 2. Scattered pneumobilia is slightly more prominent, status post placement of a common bile duct stent. External biliary drainage catheter noted in expected position, adjacent to the upper aspect of the common bile duct stent. 3. Vague soft tissue edema about the pancreatic head and second segment of duodenum is relatively stable from the prior CT, and likely reflects the recent procedure. 4. Minimal diverticulosis at the cecum, without evidence of diverticulitis. 5. Mild degenerative change along the lumbar spine.       12/26/2015 Procedure    ERCP for stent replacement to bile duct stricture       12/26/2015 Pathology Results    BILE DUCT BRUSHING WITH STENT (SPECIMEN 1 OF 1, COLLECTED ON 12/26/15): NO MALIGNANT CELLS IDENTIFIED.      11/27/2016 Initial Biopsy    Pancreas, biopsy, mass Small focus of atypical glands, suspicious for infiltrating adenocarcinoma      11/27/2016 Procedure    POST-OPERATIVE DIAGNOSIS:  Gastric outlet obstruction  PROCEDURE:   GASTROJEJUNOSTOMY, BIOPSY PANCREATIC HEAD MASS       04/23/2017 Imaging    CT CHEST ABD PELVIS IMPRESSION: 3.5 cm pancreatic head mass surrounding the common bile duct stent, consistent with pancreatic adenocarcinoma.  No evidence of metastatic disease within the abdomen or pelvis.      05/03/2017 Initial Diagnosis    Pancreatic adenocarcinoma ([facility])     HISTORY OF PRESENTING ILLNESS:   Summer Jones 83 y.o. female is here because of suspicion for pancreatic adenocarcinoma. She has been under the care of Dr. [name]  since 2016. She was referred by Dr. [name], she presents to the clinic with her husband and 2 sons.  In 2016, constipation and scleral juandice prompted the patient to seek care with her PCP. She was found to have intrahepatic and extrahepatic biliary dilation on CT abd/pelvis on 08/06/2015. A  percutaneous drainage tube was placed then later exchanged for biliary stent. Jaundice resolved at that time. The stent was replaced in 12/2015. She was hospitalized in April 2018 for constipation. She had one episode of nausea and vomiting on the way to the ER at that time. Surgical biopsy by Dr. [name] on 11/27/16 showed atypical cells concerning for pancreatic adenocarcinoma but EUS brushings by Dr. [name] showed no malignancy. In 04/2017 she developed bilateral lower abdominal pain and soreness, rating this 8/10, and constipation. She had minimal blood on tissue with wiping and has known hemorrhoids. CT abdomen and pelvis revealed 3.5 pancreatic head mass surrounding the common bile duct stent, consistent with pancreatic adenocarcinoma. For her pain, she takes ibuprofen but does not like to take medication due to chronic constipation. Associated with bloating, distention, and constipation. She uses miralax and prune juice. Last BM 05/04/17 which was small, soft and difficult. No nausea, vomiting, weight loss, or decreased appetite. However, she limits food due to fear of subsequent abdominal pain. She has minimal fatigue. Chronic dry cough from lisinopril.    MEDICAL HISTORY:  Past Medical History:  Diagnosis Date  . Arthritis   . Common bile duct (CBD) obstruction   . Difficult intubation Anterior glottis, short TMD, limited mouth opening. Required glidescope and bougie stylet with multiple attempts.  . Diverticular disease   . Diverticulosis   . Essential hypertension   . Hypertension   . Hypothyroidism   . Jaundice   . Mitral valve prolapse   . Thyroid disease    SURGICAL HISTORY: Past Surgical History:  Procedure Laterality Date  . BREAST BIOPSY Right   . BREAST CYST EXCISION Right   . ENDOSCOPIC RETROGRADE CHOLANGIOPANCREATOGRAPHY (ERCP) WITH PROPOFOL N/A 08/08/2015   Procedure: ENDOSCOPIC RETROGRADE CHOLANGIOPANCREATOGRAPHY (ERCP) WITH PROPOFOL;  Surgeon: [name], MD;  Location:  [facility];  Service: Endoscopy;  Laterality: N/A;  . 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 ESOPHAGOGASTRODUODENOSCOPY (EGD) WITH PROPOFOL N/A 11/25/2016   Procedure: ESOPHAGOGASTRODUODENOSCOPY (EGD) WITH PROPOFOL;  Surgeon: [name], MD;  Location: [facility];  Service: Endoscopy;  Laterality: N/A;  . EUS N/A 08/08/2015   Procedure: UPPER ENDOSCOPIC ULTRASOUND (EUS) RADIAL;  Surgeon: [name], MD;  Location: [facility];  Service: Endoscopy;  Laterality: N/A;  . INGUINAL HERNIA REPAIR     right  . LAPAROTOMY N/A 11/27/2016   Procedure: GASTROJEJUNOSTOMY;  Surgeon: Leighton Ruff, MD;  Location: [facility];  Service: General;  Laterality: N/A;  . PANCREAS BIOPSY  11/27/2016   Procedure: PANCREACTIC BIOPSY;  Surgeon: Leighton Ruff, MD;  Location: [facility];  Service: General;;  . Bess Kinds CHOLANGIOSCOPY N/A 12/26/2015   Procedure: OIZTIWPY CHOLANGIOSCOPY;  Surgeon: [name], MD;  Location: [facility];  Service: Endoscopy;  Laterality: N/A;  . TONSILLECTOMY     SOCIAL HISTORY: Social History   Social History  . Marital status: Married    Spouse name: N/A  . Number of children: 2  . Years of education: N/A  Occupational History  . Retired    Social History Main Topics  . Smoking status: Former Smoker    Packs/day: 1.00    Years: 10.00    Types: Cigarettes    Quit date: 86  . Smokeless tobacco: Never Used  . Alcohol use No     Comment: no recent   . Drug use: No  . Sexual activity: Not on file   Other Topics Concern  . Not on file   Social History Narrative  . No narrative on file   FAMILY HISTORY: Family History  Problem Relation Age of Onset  . Breast cancer Sister 63       treated with  radiation  . Heart disease Father    ALLERGIES:  is allergic to sulfamethoxazole.  MEDICATIONS:  Current Outpatient Prescriptions  Medication Sig Dispense Refill  . amLODipine (NORVASC) 10 MG tablet Take 10 mg by mouth at bedtime.     . hyoscyamine (LEVSIN, ANASPAZ) 0.125 MG tablet Take 1 tablet (0.125 mg total) by mouth 3 (three) times daily as needed. 90 tablet 1  . levothyroxine (SYNTHROID, LEVOTHROID) 25 MCG tablet Take 25 mcg by mouth daily before breakfast.    . lisinopril (PRINIVIL,ZESTRIL) 5 MG tablet Take 5 mg by mouth at bedtime.     . Multiple Vitamins-Minerals (MULTIVITAMIN GUMMIES ADULT) CHEW Chew 1 tablet by mouth daily.    . polyethylene glycol powder (GLYCOLAX/MIRALAX) powder Take 17 g by mouth as needed for mild constipation.     . polyvinyl alcohol (LIQUIFILM TEARS) 1.4 % ophthalmic solution Place 1 drop into both eyes daily as needed for dry eyes.     No current facility-administered medications for this visit.    REVIEW OF SYSTEMS:   Constitutional: Denies fevers, chills or abnormal night sweats. Stable weight (+) mild fatigue Eyes: Denies blurriness of vision, double vision or watery eyes Ears, nose, mouth, throat, and face: Denies mucositis or sore throat Respiratory: Denies dyspnea or wheezes (+) chronic dry cough, secondary to lisinopril Cardiovascular: Denies palpitation, chest discomfort or lower extremity swelling Gastrointestinal:  Denies nausea, heartburn or change in bowel habits (+) constipation (+) diffuse lower abdominal discomfort, bilateral Skin: Denies abnormal skin rashes Lymphatics: Denies new lymphadenopathy or easy bruising Neurological:Denies numbness, tingling or new weaknesses Behavioral/Psych: Mood is stable, no new changes  All other systems were reviewed with the patient and are negative.  PHYSICAL EXAMINATION: ECOG PERFORMANCE STATUS: 1 - Symptomatic but completely ambulatory  Vitals:   05/05/17 1143  BP: (!) 144/58  Pulse: 70    Resp: 18  Temp: (!) 97.5 F (36.4 C)  SpO2: 100%   Filed Weights   05/05/17 1143  Weight: 88 lb 11.2 oz (40.2 kg)   GENERAL:alert, no distress and comfortable SKIN: skin color, texture, turgor are normal, no rashes or significant lesions EYES: normal, conjunctiva are pink and non-injected, sclera clear OROPHARYNX:no exudate, no erythema and lips, buccal mucosa, and tongue normal  NECK: supple, thyroid normal size, non-tender, without nodularity LYMPH:  no palpable lymphadenopathy in the cervical, axillary or inguinal LUNGS: clear to auscultation and percussion with normal breathing effort HEART: regular rate & rhythm and no murmurs and no lower extremity edema ABDOMEN:abdomen soft and normal bowel sounds. RUQ tenderness to palpation. No palpable organomegaly or masses. Musculoskeletal:no cyanosis of digits and no clubbing  PSYCH: alert & oriented x 3 with fluent speech NEURO: no focal motor/sensory deficits  LABORATORY DATA:  I have reviewed the data as listed CBC Latest Ref Rng & Units  11/30/2016 11/28/2016 11/27/2016  WBC 4.0 - 10.5 K/uL 7.0 12.5(H) 5.8  Hemoglobin 12.0 - 15.0 g/dL 10.4(L) 12.7 11.7(L)  Hematocrit 36.0 - 46.0 % 30.6(L) 36.7 34.2(L)  Platelets 150 - 400 K/uL 246 262 263   CMP Latest Ref Rng & Units 04/28/2017 04/20/2017 12/01/2016  Glucose 70 - 99 mg/dL 108(H) 113(H) 116(H)  BUN 6 - 23 mg/dL 9 10 <5(L)  Creatinine 0.40 - 1.20 mg/dL 0.66 0.63 0.42(L)  Sodium 135 - 145 mEq/L 130(L) 132(L) 137  Potassium 3.5 - 5.1 mEq/L 4.0 4.7 3.7  Chloride 96 - 112 mEq/L 95(L) 97 104  CO2 19 - 32 mEq/L 26 24 28   Calcium 8.4 - 10.5 mg/dL 10.5 10.0 8.1(L)  Total Protein 6.0 - 8.3 g/dL - 7.3 -  Total Bilirubin 0.2 - 1.2 mg/dL - 0.6 -  Alkaline Phos 39 - 117 U/L - 71 -  AST 0 - 37 U/L - 23 -  ALT 0 - 35 U/L - 18 -   Pathology report  Diagnosis 11/27/2016 Pancreas, biopsy, mass SMALL FOCUS OF ATYPICAL GLANDS, SUSPICIOUS FOR INFILTRATING ADENOCARCINOMA. Microscopic  Comment Multiple levels of tissue has been exam, due to limited atypical cells, a definitive diagnosis can not be reached. This case also reviewed by Drs. Saralyn Pilar and Smir and agree.  RADIOGRAPHIC STUDIES: I have personally reviewed the radiological images as listed and agreed with the findings in the report. Ct Abdomen Pelvis W Wo Contrast  Result Date: 04/23/2017 CLINICAL DATA:  Worsening generalized abdominal pain. Biliary stricture with previous stent placement. Needle biopsy suspicious for pancreatic adenocarcinoma. Status post gastrojejunostomy for gastric outlet obstruction. EXAM: CT ABDOMEN AND PELVIS WITHOUT AND WITH CONTRAST TECHNIQUE: Multidetector CT imaging of the abdomen and pelvis was performed following the standard protocol before and following the bolus administration of intravenous contrast. CONTRAST:  100 mL Isovue-300 COMPARISON:  11/24/2016 FINDINGS: Lower Chest: No acute findings. Hepatobiliary: No hepatic masses identified. Internal stent again seen within the common bile duct with mild pneumobilia but no significant biliary ductal dilatation. Gallbladder is unremarkable. Pancreas: Diffuse pancreatic ductal dilatation again seen. A hypovascular mass is now seen in the pancreatic head surrounding the common bile duct stent which measures 3.5 x 3.4 cm on image 39/4. This is consistent with pancreatic adenocarcinoma. No evidence of vascular involvement of the superior mesenteric artery or vein. Spleen: Within normal limits in size and appearance. Adrenals/Urinary Tract: No masses identified. Tiny sub-cm low attenuation lesions are too small to characterize, but statistically most likely represent tiny cysts. No evidence of hydronephrosis. Stomach/Bowel: Postop changes from gastrojejunostomy. No evidence of obstruction, inflammatory process or abnormal fluid collections. Vascular/Lymphatic: No pathologically enlarged lymph nodes. No abdominal aortic aneurysm. Reproductive:  No mass or  other significant abnormality. Other:  None. Musculoskeletal:  No suspicious bone lesions identified. IMPRESSION: 3.5 cm pancreatic head mass surrounding the common bile duct stent, consistent with pancreatic adenocarcinoma. No evidence of metastatic disease within the abdomen or pelvis. Electronically Signed   By: Earle Gell M.D.   On: 04/23/2017 11:02   Mm Diag Breast Tomo Uni Left  Result Date: 04/14/2017 CLINICAL DATA:  Followup probably benign calcifications in the central. EXAM: 2D DIGITAL DIAGNOSTIC UNILATERAL LEFT MAMMOGRAM WITH CAD AND ADJUNCT TOMO COMPARISON:  Previous exam(s). ACR Breast Density Category c: The breast tissue is heterogeneously dense, which may obscure small masses. FINDINGS: The previously demonstrated probably benign calcifications in the central left breast have typically benign features today. These are coarse and heterogeneous with coral-like configurations. No interval  findings malignancy. Mammographic images were processed with CAD. 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 No evidence of malignancy. RECOMMENDATION: Bilateral screening mammogram in 6 months when due. That will be 1 year since mammographic evaluation of the right breast. I have discussed the findings and recommendations with the patient. Results were also provided in writing at the conclusion of the visit. If applicable, a reminder letter will be sent to the patient regarding the next appointment. BI-RADS CATEGORY  2: Benign. Electronically Signed   By: Claudie Revering M.D.   On: 04/14/2017 15:39   ASSESSMENT & PLAN: Shanele Nissan is a 83 y.o.  female with arthritis, HTN, thyroid disease, history of bilary duct stent, and pancreas mass suspicious for adenocarcinoma.   1. Pancreas mass suspicious for primary pancreatic adenocarcinoma -I reviewed the patient's medical records extensively with the patient and family.  -She had a multiple bile duct brushings,  cytologies were negative. Her last fine-needle biopsy of the pancreatic mass during her gastric bypass surgery for gastric outlet obstructions in April 2018 showed small cluster of atypical cells, suspicious for adenocarcinoma, but not definitive. -Unfortunately we do not have a definitive diagnosis but the biopsy results and her clinical picture are concerning for pancreatic adenocarcinoma. Hers appears to be indolent, however, over the past two years.  -The patient and her family are interested in confirming a diagnosis, I will recommend one additional biopsy to obtain more tissue for definitive diagnosis. We discussed the option of percutaneous biopsy by interventional radiology, or EUS and biopsy by Dr. [name]. I'll discuss with IR and Dr. [name]. -If this confirms pancreatic adenocarcinoma, surgery is not a likely option due to the location of her tumor and her advanced age, also there is no clear vascular invasion by the tumor on the CT scan.  -The goal of therapy is palliative, if no surgery. We discussed various options  -Chemotherapy is a possible option to control symptoms and improve quality of life, such as single agent Gemcitabine  -Radiation is a good option if pain increases and she does not want pain medication due to chronic constipation; I discussed possible side effects of skin change, nausea, diarrhea, fatigue are common side effects -I discussed potential for celiac nerve block to improve pain if oral agents are not sufficient -I discussed palliative and comfort care in the event the patient does not elect active treatment -Patient and her family voiced good understanding about the options, and she is interested in follow-up with [place] for palliative care in the future -I will discuss with IR and Dr. [name] to determine best method for biopsy -f/u 1 month   2. Abdominal pain -Likely secondary to the pancreatic tumor -We discussed options of pain medicines, patient declined due to her  concern of constipation  3. Chronic constipation -We discussed constipation management, I encouraged her to take a stool softener, and increase MiraLAX dose -We discussed other laxatives options, such as magnesium citrate for severe constipation  4. Hypertension, hypothyroidism -Follow up with primary care physician  Plan: -CT chest wo contrast in 1 week to complete work-up  -Repeat biopsy with Dr. [name] or IR -f/u 3-5 weeks  Orders Placed This Encounter  Procedures  . CT Chest Wo Contrast    Standing Status:   Future    Standing Expiration Date:   05/05/2018    Order Specific Question:   Preferred imaging location?    Answer:   [facility]    Order Specific Question:  Radiology Contrast Protocol - do NOT remove file path    Answer:   [email or web address]    Order Specific Question:   Reason for Exam additional comments    Answer:   suspected pancreas cancer, rule out thoracic metastasis    All questions were answered. The patient knows to call the clinic with any problems, questions or concerns. I spent 45 counseling the patient face to face. The total time spent in the appointment was 60 and more than 50% was on counseling.   Alla Feeling, NP 05/05/2017 1:21 PM  I have seen the patient, examined her. I agree with the assessment and and plan and have edited the notes.   Truitt Merle  05/05/2017

## 2017-05-05 NOTE — Progress Notes (Signed)
  Oncology Nurse Navigator Documentation  Navigator Location: [facility] (05/05/17 1300) Referral date to RadOnc/MedOnc: 04/28/17 (05/05/17 1300) )Navigator Encounter Type: Initial MedOnc (05/05/17 1300)   Abnormal Finding Date: 04/23/17 (05/05/17 1300)                 Patient Visit Type: MedOnc (05/05/17 1300) Treatment Phase: Abnormal Scans (05/05/17 1300) Barriers/Navigation Needs: No Needs (05/05/17 1300)   Interventions: Psycho-social support (05/05/17 1300)  I introduced myself and the role of the GI Navigator prior to her med/onc appointment with Dr. Burr Medico and Cira Rue NP.  I provided patient with a copy of "An Overview of Pancreatic Cancer: A Guide to Understanding A Complex Disease" by [employer]. I briefly met with patient and her family after her med/onc appointment and encouraged her to call with questions or concerns.            Acuity: Level 1 (05/05/17 1300) Acuity Level 1: Initial guidance, education and coordination as needed;Minimal follow up required (05/05/17 1300)       Time Spent with Patient: 15 (05/05/17 1300)

## 2017-05-06 ENCOUNTER — Other Ambulatory Visit: Payer: Self-pay | Admitting: Student

## 2017-05-06 NOTE — Addendum Note (Signed)
Addended by: Truitt Merle on: 05/06/2017 09:03 AM   Modules accepted: Orders

## 2017-05-07 ENCOUNTER — Telehealth: Payer: Self-pay | Admitting: Nurse Practitioner

## 2017-05-07 NOTE — Telephone Encounter (Signed)
In preparation of upcoming IR ct-guided biopsy, patient instructed to begin low fiber diet from now until biopsy and to have miralax every 2-4 hours until she has watery bowel movements the day before surgery per MD instructions. We reviewed low-fiber diet and miralax doses. She verbalizes understanding and thanks for the call.

## 2017-05-09 ENCOUNTER — Other Ambulatory Visit: Payer: Self-pay | Admitting: Radiology

## 2017-05-10 ENCOUNTER — Other Ambulatory Visit: Payer: Self-pay | Admitting: Student

## 2017-05-11 ENCOUNTER — Ambulatory Visit (HOSPITAL_COMMUNITY)
Admission: RE | Admit: 2017-05-11 | Discharge: 2017-05-11 | Disposition: A | Discharge status: Home / Self Care | Payer: Medicare Other | Source: Ambulatory Visit | Attending: Hematology | Admitting: Hematology

## 2017-05-11 DIAGNOSIS — C259 Malignant neoplasm of pancreas, unspecified: Secondary | ICD-10-CM

## 2017-05-13 ENCOUNTER — Telehealth: Payer: Self-pay

## 2017-05-13 ENCOUNTER — Ambulatory Visit (HOSPITAL_COMMUNITY)
Admission: RE | Admit: 2017-05-13 | Discharge: 2017-05-13 | Disposition: A | Discharge status: Home / Self Care | Payer: Medicare Other | Source: Ambulatory Visit | Attending: Nurse Practitioner | Admitting: Nurse Practitioner

## 2017-05-13 DIAGNOSIS — I700 Atherosclerosis of aorta: Secondary | ICD-10-CM | POA: Insufficient documentation

## 2017-05-13 DIAGNOSIS — C259 Malignant neoplasm of pancreas, unspecified: Secondary | ICD-10-CM | POA: Insufficient documentation

## 2017-05-13 DIAGNOSIS — E041 Nontoxic single thyroid nodule: Secondary | ICD-10-CM | POA: Insufficient documentation

## 2017-05-13 DIAGNOSIS — M47814 Spondylosis without myelopathy or radiculopathy, thoracic region: Secondary | ICD-10-CM | POA: Insufficient documentation

## 2017-05-13 NOTE — Telephone Encounter (Signed)
  Oncology Nurse Navigator Documentation   Patient's son called with questions about patient's diagnosis and testing needed to confirm. I spoke with Dr. Burr Medico who will call him back to answer questions.

## 2017-05-13 NOTE — Progress Notes (Signed)
  Oncology Nurse Navigator Documentation  Navigator Location: [facility] (05/13/17 0935)   )Navigator Encounter Type: Telephone (05/13/17 0935) Telephone: Incoming Call;Outgoing Call (05/13/17 0935)                     Treatment Phase: Pre-Tx/Tx Discussion (05/13/17 0935) Barriers/Navigation Needs: Coordination of Care (05/13/17 0935)   Interventions: Education;Psycho-social support;Coordination of Care (05/13/17 0935)  Patient called and left message requesting a call back. She stated in the message that she was "upset and confused."  I called the patient back and she shared that she completed the bowl prep for the Needle BX and when she got to the hospital the machine was broken and she had to be rescheduled for 10/3. Patient also stated that she had been up all night in the bathroom and was "exhausted." I spoke with Dr. Burr Medico and Cira Rue NP and was told that patient could choose not to have the CT BX. I called patient back and discussed this with patient and she does not want to have the CT BX. The patient is drinking fluids and is prepared to come in for her Chest CT this afternoon. I encouraged patient to call me with any concerns or questions. Coordination of Care: Other (CT Scan and BX) (05/13/17 0935)  Education Method: Verbal (05/13/17 0935)      Acuity: Level 2 (05/13/17 0935)   Acuity Level 2: Initial guidance, education and coordination as needed;Educational needs;Ongoing guidance and education throughout treatment as needed (05/13/17 0935)     Time Spent with Patient: 45 (05/13/17 0935)

## 2017-05-14 ENCOUNTER — Ambulatory Visit (HOSPITAL_COMMUNITY): Payer: Medicare Other

## 2017-05-17 ENCOUNTER — Ambulatory Visit (HOSPITAL_COMMUNITY): Admission: RE | Admit: 2017-05-17 | Payer: Medicare Other | Source: Ambulatory Visit

## 2017-05-17 DIAGNOSIS — C259 Malignant neoplasm of pancreas, unspecified: Secondary | ICD-10-CM

## 2017-05-17 HISTORY — DX: Malignant neoplasm of pancreas, unspecified: C25.9

## 2017-05-17 NOTE — Progress Notes (Signed)
  Oncology Nurse Navigator Documentation  Navigator Location: [facility] (05/17/17 1035)   )Navigator Encounter Type: Telephone (05/17/17 1035) Telephone: Incoming Call (05/17/17 1035)                       Barriers/Navigation Needs: Education (Education on PET scan prep) (05/17/17 1035) Education: Other (PET scan prep) (05/17/17 1035) Interventions: Psycho-social support;Education (05/17/17 1035)  Patient called to say thank you for speaking with her son regarding plan of care. Patient has questions about timing of prep for PET schan later in the week. I will forward questions to Dr. Ernestina Penna nurse.   Education Method: Other (will forward question to Dr. Ernestina Penna nurse) (05/17/17 1035)      Acuity: Level 1 (05/17/17 1035)         Time Spent with Patient: 15 (05/17/17 1035)

## 2017-05-17 NOTE — Progress Notes (Signed)
  Oncology Nurse Navigator Documentation  Navigator Location: [facility] (05/17/17 1053)   )Navigator Encounter Type: Telephone (05/17/17 1053) Telephone: [place] Call (05/17/17 1053)                       Barriers/Navigation Needs: Education (Per Scan Prep) (05/17/17 1053)   Interventions: Education (05/17/17 1053)     Education Method: Verbal (05/17/17 1053)   I spoke with Dr. Ernestina Penna nurse, Janifer Adie H, regarding prep for PET scan. Myrtle suggested patient call Nuclear Medicine to ask about required timing for PET scan prep. I called patient back and gave her the phone number for Nuclear Medicine.   Acuity: Level 1 (05/17/17 1053)         Time Spent with Patient: 15 (05/17/17 1053)

## 2017-05-18 ENCOUNTER — Other Ambulatory Visit: Payer: Self-pay | Admitting: General Surgery

## 2017-05-18 ENCOUNTER — Other Ambulatory Visit: Payer: Self-pay | Admitting: Student

## 2017-05-19 ENCOUNTER — Ambulatory Visit (HOSPITAL_COMMUNITY): Payer: Medicare Other

## 2017-05-19 ENCOUNTER — Ambulatory Visit (HOSPITAL_COMMUNITY)
Admission: RE | Admit: 2017-05-19 | Discharge: 2017-05-19 | Disposition: A | Discharge status: Home / Self Care | Payer: Medicare Other | Source: Ambulatory Visit | Attending: Hematology | Admitting: Hematology

## 2017-05-19 DIAGNOSIS — K831 Obstruction of bile duct: Secondary | ICD-10-CM | POA: Insufficient documentation

## 2017-05-19 DIAGNOSIS — Z8249 Family history of ischemic heart disease and other diseases of the circulatory system: Secondary | ICD-10-CM | POA: Diagnosis not present

## 2017-05-19 DIAGNOSIS — E039 Hypothyroidism, unspecified: Secondary | ICD-10-CM | POA: Insufficient documentation

## 2017-05-19 DIAGNOSIS — K5790 Diverticulosis of intestine, part unspecified, without perforation or abscess without bleeding: Secondary | ICD-10-CM | POA: Diagnosis not present

## 2017-05-19 DIAGNOSIS — Z882 Allergy status to sulfonamides status: Secondary | ICD-10-CM | POA: Diagnosis not present

## 2017-05-19 DIAGNOSIS — Z87891 Personal history of nicotine dependence: Secondary | ICD-10-CM | POA: Insufficient documentation

## 2017-05-19 DIAGNOSIS — C250 Malignant neoplasm of head of pancreas: Secondary | ICD-10-CM | POA: Diagnosis not present

## 2017-05-19 DIAGNOSIS — I10 Essential (primary) hypertension: Secondary | ICD-10-CM | POA: Insufficient documentation

## 2017-05-19 DIAGNOSIS — Z79899 Other long term (current) drug therapy: Secondary | ICD-10-CM | POA: Diagnosis not present

## 2017-05-19 DIAGNOSIS — K869 Disease of pancreas, unspecified: Secondary | ICD-10-CM | POA: Diagnosis present

## 2017-05-19 DIAGNOSIS — Z803 Family history of malignant neoplasm of breast: Secondary | ICD-10-CM | POA: Insufficient documentation

## 2017-05-19 LAB — CBC
HEMATOCRIT: 37.7 % (ref 36.0–46.0)
Hemoglobin: 12.8 g/dL (ref 12.0–15.0)
MCH: 30.9 pg (ref 26.0–34.0)
MCHC: 34 g/dL (ref 30.0–36.0)
MCV: 91.1 fL (ref 78.0–100.0)
Platelets: 309 10*3/uL (ref 150–400)
RBC: 4.14 MIL/uL (ref 3.87–5.11)
RDW: 13.9 % (ref 11.5–15.5)
WBC: 5.3 10*3/uL (ref 4.0–10.5)

## 2017-05-19 LAB — APTT: APTT: 28 s (ref 24–36)

## 2017-05-19 LAB — PROTIME-INR
INR: 0.89
Prothrombin Time: 12 seconds (ref 11.4–15.2)

## 2017-05-19 MED ORDER — MIDAZOLAM HCL 2 MG/2ML IJ SOLN
INTRAMUSCULAR | Status: AC
  Filled 2017-05-19: qty 4

## 2017-05-19 MED ORDER — LIDOCAINE HCL 1 % IJ SOLN
INTRAMUSCULAR | Status: AC
  Filled 2017-05-19: qty 20

## 2017-05-19 MED ORDER — FENTANYL CITRATE (PF) 100 MCG/2ML IJ SOLN
INTRAMUSCULAR | Status: AC | PRN
  Administered 2017-05-19 (×2): 25 ug via INTRAVENOUS

## 2017-05-19 MED ORDER — SODIUM CHLORIDE 0.9 % IV SOLN: INTRAVENOUS | Status: DC

## 2017-05-19 MED ORDER — FENTANYL CITRATE (PF) 100 MCG/2ML IJ SOLN
INTRAMUSCULAR | Status: AC
  Filled 2017-05-19: qty 2

## 2017-05-19 MED ORDER — HYDROCODONE-ACETAMINOPHEN 5-325 MG PO TABS: 1.0000 | ORAL_TABLET | ORAL | Status: DC | PRN

## 2017-05-19 MED ORDER — MIDAZOLAM HCL 2 MG/2ML IJ SOLN
INTRAMUSCULAR | Status: AC | PRN
  Administered 2017-05-19 (×2): 1 mg via INTRAVENOUS

## 2017-05-19 NOTE — Sedation Documentation (Signed)
Bandaid R abd area intact

## 2017-05-19 NOTE — Procedures (Signed)
Interventional Radiology Procedure Note  Procedure: CT guided core biopsy of pancreatic mass  Complications: None  Estimated Blood Loss: < 10 mL  17 G needle advanced to level of lateral pancreatic head.  10 G core biopsy performed x 2, yielding solid tissue.  Venetia Night. Kathlene Cote, M.D Pager:  [phone]

## 2017-05-19 NOTE — Discharge Instructions (Signed)
Needle Biopsy, Care After °Refer to this sheet in the next few weeks. These instructions provide you with information about caring for yourself after your procedure. Your health care provider may also give you more specific instructions. Your treatment has been planned according to current medical practices, but problems sometimes occur. Call your health care provider if you have any problems or questions after your procedure. °What can I expect after the procedure? °After your procedure, it is common to have soreness, bruising, or mild pain at the biopsy site. This should go away in a few days. °Follow these instructions at home: °· Rest as directed by your health care provider. °· Take medicines only as directed by your health care provider. °· There are many different ways to close and cover the biopsy site, including stitches (sutures), skin glue, and adhesive strips. Follow your health care provider's instructions about: °? Biopsy site care. °? Bandage (dressing) changes and removal. °? Biopsy site closure removal. °· Check your biopsy site every day for signs of infection. Watch for: °? Redness, swelling, or pain. °? Fluid, blood, or pus. °Contact a health care provider if: °· You have a fever. °· You have redness, swelling, or pain at the biopsy site that lasts longer than a few days. °· You have fluid, blood, or pus coming from the biopsy site. °· You feel nauseous. °· You vomit. °Get help right away if: °· You have shortness of breath. °· You have trouble breathing. °· You have chest pain. °· You feel dizzy or you faint. °· You have bleeding that does not stop with pressure or a bandage. °· You cough up blood. °· You have pain in your abdomen. °This information is not intended to replace advice given to you by your health care provider. Make sure you discuss any questions you have with your health care provider. °Document Released: 12/18/2014 Document Revised: 01/09/2016 Document Reviewed:  07/30/2014 °Elsevier Interactive Patient Education © 2018 [employer] ° °

## 2017-05-19 NOTE — Consult Note (Signed)
Chief Complaint: Patient was seen in consultation today for pancreatic mass  Referring Physician(s): Feng,Yan  Supervising Physician: Aletta Edouard  Patient Status: [facility] - Out-pt  History of Present Illness: ANIJAH SPOHR is a 83 y.o. female with past medical history of diverticluar disease and HTN who was recently admitted with biliary obstruction and jaundice. She is known to IR department s/p internal external biliary drain placement and removal.  She current has an external drainage catheter in place proximal to biliary stent.   CT Abdomen/Pelvis 04/23/17: 3.5 cm pancreatic head mass surrounding the common bile duct stent, consistent with pancreatic adenocarcinoma.  No evidence of metastatic disease within the abdomen or pelvis.  Despite attempts to biopsy mass, all testing thus far has shown atypia only.   She presents to radiology department today for pancreatic mass biopsy using CT-guidance.   She has been NPO.  She does not take blood thinners.  She has been in her usual state of health.   Past Medical History:  Diagnosis Date  . Arthritis   . Common bile duct (CBD) obstruction   . Difficult intubation Anterior glottis, short TMD, limited mouth opening. Required glidescope and bougie stylet with multiple attempts.  . Diverticular disease   . Diverticulosis   . Essential hypertension   . Hypertension   . Hypothyroidism   . Jaundice   . Mitral valve prolapse   . Thyroid disease     Past Surgical History:  Procedure Laterality Date  . BREAST BIOPSY Right   . BREAST CYST EXCISION Right   . ENDOSCOPIC RETROGRADE CHOLANGIOPANCREATOGRAPHY (ERCP) WITH PROPOFOL N/A 08/08/2015   Procedure: ENDOSCOPIC RETROGRADE CHOLANGIOPANCREATOGRAPHY (ERCP) WITH PROPOFOL;  Surgeon: [name], MD;  Location: [facility] ENDOS[facility];  Service: Endoscopy;  Laterality: N/A;  . ENDOSCOPIC RETROGRADE CHOLANGIOPANCREATOGRAPHY (ERCP) WITH PROPOFOL N/A 12/26/2015   Procedure: ENDOSCOPIC  RETROGRADE CHOLANGIOPANCREATOGRAPHY (ERCP) WITH PROPOFOL;  Surgeon: [name], MD;  Location: [facility];  Service: Endoscopy;  Laterality: N/A;  [employer] scientific rep needed  . ESOPHAGOGASTRODUODENOSCOPY (EGD) WITH PROPOFOL N/A 11/25/2016   Procedure: ESOPHAGOGASTRODUODENOSCOPY (EGD) WITH PROPOFOL;  Surgeon: [name], MD;  Location: [facility];  Service: Endoscopy;  Laterality: N/A;  . EUS N/A 08/08/2015   Procedure: UPPER ENDOSCOPIC ULTRASOUND (EUS) RADIAL;  Surgeon: [name], MD;  Location: [facility];  Service: Endoscopy;  Laterality: N/A;  . INGUINAL HERNIA REPAIR     right  . LAPAROTOMY N/A 11/27/2016   Procedure: GASTROJEJUNOSTOMY;  Surgeon: Leighton Ruff, MD;  Location: [facility];  Service: General;  Laterality: N/A;  . PANCREAS BIOPSY  11/27/2016   Procedure: PANCREACTIC BIOPSY;  Surgeon: Leighton Ruff, MD;  Location: [facility];  Service: General;;  . Bess Kinds CHOLANGIOSCOPY N/A 12/26/2015   Procedure: QIWLNLGX CHOLANGIOSCOPY;  Surgeon: [name], MD;  Location: [facility];  Service: Endoscopy;  Laterality: N/A;  . TONSILLECTOMY      Allergies: Sulfamethoxazole  Medications: Prior to Admission medications   Medication Sig Start Date End Date Taking? Authorizing Provider  amLODipine (NORVASC) 10 MG tablet Take 10 mg by mouth at bedtime.  06/16/15  Yes [provider]  Cholecalciferol (VITAMIN D3) 400 units CAPS Take 2 capsules by mouth daily.   Yes [provider]  hyoscyamine (LEVSIN, ANASPAZ) 0.125 MG tablet Take 1 tablet (0.125 mg total) by mouth 3 (three) times daily as needed. 04/20/17  Yes Willia Craze, NP  levothyroxine (SYNTHROID, LEVOTHROID) 25 MCG tablet Take 25 mcg by mouth daily before breakfast.  Yes [provider]  lisinopril (PRINIVIL,ZESTRIL) 5 MG tablet Take 5 mg by mouth at bedtime.    Yes [provider]  Multiple Vitamins-Minerals (MULTIVITAMIN GUMMIES ADULT) CHEW Chew 1 tablet by mouth  daily.   Yes [provider]  polyethylene glycol powder (GLYCOLAX/MIRALAX) powder Take 17 g by mouth daily as needed for mild constipation.    Yes [provider]  polyvinyl alcohol (LIQUIFILM TEARS) 1.4 % ophthalmic solution Place 1 drop into both eyes daily as needed for dry eyes.   Yes [provider]     Family History  Problem Relation Age of Onset  . Breast cancer Sister 38       treated with radiation  . Heart disease Father     Social History   Social History  . Marital status: Married    Spouse name: N/A  . Number of children: 2  . Years of education: N/A   Occupational History  . Retired    Social History Main Topics  . Smoking status: Former Smoker    Packs/day: 1.00    Years: 10.00    Types: Cigarettes    Quit date: 34  . Smokeless tobacco: Never Used  . Alcohol use No     Comment: no recent   . Drug use: No  . Sexual activity: Not on file   Other Topics Concern  . Not on file   Social History Narrative  . No narrative on file    Review of Systems  Constitutional: Negative for fatigue and fever.  Respiratory: Negative for cough and shortness of breath.   Cardiovascular: Negative for chest pain.  Gastrointestinal: Positive for abdominal pain and nausea.  Musculoskeletal: Negative for back pain.  Psychiatric/Behavioral: Negative for behavioral problems and confusion.    Vital Signs: BP 138/66   Pulse 68   Temp (!) 97.5 F (36.4 C)   Resp 18   Ht 4\' 11"  (1.499 m)   Wt 88 lb (39.9 kg)   SpO2 100%   BMI 17.77 kg/m   Physical Exam  Constitutional: She is oriented to person, place, and time. She appears well-developed.  Cardiovascular: Normal rate, regular rhythm and normal heart sounds.   Pulmonary/Chest: Effort normal and breath sounds normal. No respiratory distress.  Abdominal: Soft.  Neurological: She is alert and oriented to person, place, and time.  Skin: Skin is warm and dry.  Psychiatric: She has a  normal mood and affect. Her behavior is normal. Judgment and thought content normal.  Nursing note and vitals reviewed.   Imaging: Ct Abdomen Pelvis W Wo Contrast  Result Date: 04/23/2017 CLINICAL DATA:  Worsening generalized abdominal pain. Biliary stricture with previous stent placement. Needle biopsy suspicious for pancreatic adenocarcinoma. Status post gastrojejunostomy for gastric outlet obstruction. EXAM: CT ABDOMEN AND PELVIS WITHOUT AND WITH CONTRAST TECHNIQUE: Multidetector CT imaging of the abdomen and pelvis was performed following the standard protocol before and following the bolus administration of intravenous contrast. CONTRAST:  100 mL Isovue-300 COMPARISON:  11/24/2016 FINDINGS: Lower Chest: No acute findings. Hepatobiliary: No hepatic masses identified. Internal stent again seen within the common bile duct with mild pneumobilia but no significant biliary ductal dilatation. Gallbladder is unremarkable. Pancreas: Diffuse pancreatic ductal dilatation again seen. A hypovascular mass is now seen in the pancreatic head surrounding the common bile duct stent which measures 3.5 x 3.4 cm on image 39/4. This is consistent with pancreatic adenocarcinoma. No evidence of vascular involvement of the superior mesenteric artery or vein. Spleen: Within  normal limits in size and appearance. Adrenals/Urinary Tract: No masses identified. Tiny sub-cm low attenuation lesions are too small to characterize, but statistically most likely represent tiny cysts. No evidence of hydronephrosis. Stomach/Bowel: Postop changes from gastrojejunostomy. No evidence of obstruction, inflammatory process or abnormal fluid collections. Vascular/Lymphatic: No pathologically enlarged lymph nodes. No abdominal aortic aneurysm. Reproductive:  No mass or other significant abnormality. Other:  None. Musculoskeletal:  No suspicious bone lesions identified. IMPRESSION: 3.5 cm pancreatic head mass surrounding the common bile duct stent,  consistent with pancreatic adenocarcinoma. No evidence of metastatic disease within the abdomen or pelvis. Electronically Signed   By: Earle Gell M.D.   On: 04/23/2017 11:02   Ct Chest Wo Contrast  Result Date: 05/13/2017 CLINICAL DATA:  Pancreatic adenocarcinoma question metastatic disease EXAM: CT CHEST WITHOUT CONTRAST TECHNIQUE: Multidetector CT imaging of the chest was performed following the standard protocol without IV contrast. Sagittal and coronal MPR images reconstructed from axial data set. COMPARISON:  None FINDINGS: Cardiovascular: Atherosclerotic calcification thoracic aorta. Aorta normal caliber without aneurysm. No pericardial effusion. Mediastinum/Nodes: Esophagus unremarkable. Tiny nonspecific nodule at inferior RIGHT thyroid lobe 6 mm diameter. Base of cervical region otherwise unremarkable. No thoracic adenopathy. Lungs/Pleura: Minimal biapical scarring and interseptal thickening. Tiny calcified granulomata RIGHT lung. Lungs otherwise clear. No acute infiltrate, pleural effusion or pneumothorax. Upper Abdomen: CBD stent within pancreas. Pneumobilia. Pancreatic head is ill defined with pancreatic ductal dilatation. Musculoskeletal: Degenerative disc disease changes at thoracolumbar junction region. IMPRESSION: No definite evidence of intrathoracic metastatic disease. Old granulomatous disease. Aortic Atherosclerosis (ICD10-I70.0). Electronically Signed   By: Lavonia Dana M.D.   On: 05/13/2017 17:18    Labs:  CBC:  Recent Labs  11/27/16 0409 11/28/16 0431 11/30/16 0408 05/19/17 0944  WBC 5.8 12.5* 7.0 5.3  HGB 11.7* 12.7 10.4* 12.8  HCT 34.2* 36.7 30.6* 37.7  PLT 263 262 246 309    COAGS: No results for input(s): INR, APTT in the last 8760 hours.  BMP:  Recent Labs  11/27/16 0409 11/28/16 0431 11/30/16 0408 12/01/16 0343 04/20/17 1528 04/28/17 1359  NA 140 133* 136 137 132* 130*  K 3.1* 3.9 2.8* 3.7 4.7 4.0  CL 107 97* 103 104 97 95*  CO2 26 25 24 28 24 26     GLUCOSE 103* 99 94 116* 113* 108*  BUN <5* 5* <5* <5* 10 9  CALCIUM 8.7* 8.8* 8.0* 8.1* 10.0 10.5  CREATININE 0.54 0.57 0.50 0.42* 0.63 0.66  GFRNONAA >60 >60 >60 >60  --   --   GFRAA >60 >60 >60 >60  --   --     LIVER FUNCTION TESTS:  Recent Labs  09/15/16 1553 11/23/16 2146 11/25/16 0338 04/20/17 1528  BILITOT 0.4 0.9 0.9 0.6  AST 30 35 31 23  ALT 27 33 22 18  ALKPHOS 124* 121 78 71  PROT 7.4 7.7 5.5* 7.3  ALBUMIN 4.4 4.6 3.1* 4.7    TUMOR MARKERS: No results for input(s): AFPTM, CEA, CA199, CHROMGRNA in the last 8760 hours.  Assessment and Plan: Patient with past medical history of pancreatic mass, biliary obstruction presents to radiology department for tissue biopsy.  Case reviewed by Dr. Kathlene Cote who approves patient for procedure.  Patient presents today in their usual state of health.  She has been NPO and is not currently on blood thinners.  Risks and benefits discussed with the patient including, but not limited to bleeding, infection, damage to adjacent structures or low yield requiring additional tests. All of the patient's  questions were answered, patient is agreeable to proceed. Consent signed and in chart.   Thank you for this interesting consult.  I greatly enjoyed meeting LIZZETT NOBILE and look forward to participating in their care.  A copy of this report was sent to the requesting provider on this date.  Electronically Signed: Docia Barrier, PA 05/19/2017, 10:27 AM   I spent a total of    15 Minutes in face to face in clinical consultation, greater than 50% of which was counseling/coordinating care for pancreatic mass.

## 2017-05-19 NOTE — Sedation Documentation (Signed)
Bedrest 2 hours

## 2017-05-19 NOTE — Sedation Documentation (Signed)
Patient is resting comfortably. 

## 2017-05-20 ENCOUNTER — Other Ambulatory Visit: Payer: Self-pay | Admitting: Hematology

## 2017-05-21 ENCOUNTER — Ambulatory Visit (HOSPITAL_COMMUNITY): Payer: Medicare Other

## 2017-05-25 ENCOUNTER — Ambulatory Visit (HOSPITAL_BASED_OUTPATIENT_CLINIC_OR_DEPARTMENT_OTHER): Payer: Medicare Other | Admitting: Hematology

## 2017-05-25 ENCOUNTER — Telehealth: Payer: Self-pay | Admitting: Hematology

## 2017-05-25 ENCOUNTER — Encounter: Payer: Self-pay | Admitting: Hematology

## 2017-05-25 VITALS — BP 140/61 | HR 70 | Temp 98.4°F | Resp 20 | Ht 59.0 in | Wt 86.3 lb

## 2017-05-25 DIAGNOSIS — I10 Essential (primary) hypertension: Secondary | ICD-10-CM | POA: Diagnosis not present

## 2017-05-25 DIAGNOSIS — Z23 Encounter for immunization: Secondary | ICD-10-CM

## 2017-05-25 DIAGNOSIS — K5909 Other constipation: Secondary | ICD-10-CM

## 2017-05-25 DIAGNOSIS — K869 Disease of pancreas, unspecified: Secondary | ICD-10-CM | POA: Diagnosis not present

## 2017-05-25 DIAGNOSIS — R1030 Lower abdominal pain, unspecified: Secondary | ICD-10-CM | POA: Diagnosis not present

## 2017-05-25 DIAGNOSIS — E039 Hypothyroidism, unspecified: Secondary | ICD-10-CM | POA: Diagnosis not present

## 2017-05-25 DIAGNOSIS — C259 Malignant neoplasm of pancreas, unspecified: Secondary | ICD-10-CM

## 2017-05-25 MED ORDER — PANCRELIPASE (LIP-PROT-AMYL) 12000-38000 UNITS PO CPEP: 24000.0000 [IU] | ORAL_CAPSULE | Freq: Three times a day (TID) | ORAL | 1 refills | Status: DC

## 2017-05-25 MED ORDER — INFLUENZA VAC SPLIT QUAD 0.5 ML IM SUSY
0.5000 mL | PREFILLED_SYRINGE | Freq: Once | INTRAMUSCULAR | Status: AC
  Administered 2017-05-25: 0.5 mL via INTRAMUSCULAR
  Filled 2017-05-25: qty 0.5

## 2017-05-25 NOTE — Progress Notes (Signed)
[place]  Telephone:([phone]) [phone] Fax:(336) [phone]  Clinic Follow Up Note   Patient Care Team: Crist Infante, MD as PCP - General (Internal Medicine) [name], MD as Attending Physician (Gastroenterology) Truitt Merle, MD as Consulting Physician (Hematology) 05/25/2017  CHIEF COMPLAINTS:  Pancreas mass suspicious for pancreatic adenocarcinoma.  Oncology History   Cancer Staging Pancreatic adenocarcinoma [facility]) Staging form: Exocrine Pancreas, AJCC 8th Edition - Clinical stage from 05/19/2017: Stage IB (cT2, cN0, cM0) - Signed by Truitt Merle, MD on 05/25/2017       Pancreatic adenocarcinoma ([place])   08/06/2015 Imaging    CT ABD/PELVIS IMPRESSION: 1. Intra and extrahepatic biliary dilatation with an abrupt cut off of the common bile duct near its entry into the pancreatic head. Findings most suspicious for a biliary tumor (cholangiocarcinoma) but a common bile duct stone and pancreatic lesion are also possible. MRCP may be helpful for further evaluation if the patient can hold her breath. Otherwise, ERCP is suggested for both diagnostic and therapeutic purposes. 2. Moderate distention of the gallbladder without obvious gallstones. 3. No liver lesions or abdominal lymphadenopathy.       08/08/2015 Procedure    EUS and ERCP per Dr. [name]: indicated for painless juandice, dilated biliary tree  Very vaguely abnormal region in the pancreatic head. This was hypoechoic and SOMEWHAT masslike, measured 2cm Maximally.  2. CBD was dilated up to 80m, tapered abruptly in the head of pancreas. 3. Main pancreatic duct was non-dilated 4. No peripancreatic adenopathy.      08/08/2015 Pathology Results    EUS path: Common Bile Duct , bx SMALL FRAGMENT OF BILE DUCT EPITHELIUM, PERIDUCTAL GLANDS AND FIBROMUSCULAR TISSUE NEGATIVE FOR MALIGNANCY      08/08/2015 Procedure    IR biliary stent placement       09/05/2015 Pathology Results    1. Soft tissue,  biopsy, common bile duct - SCANT SOFT TISSUE AND NECROSIS WITH ACUTE INFLAMMATION. - SCANT BENIGN GLANDULAR EPITHLEIUM. - NO MALIGNANCY IDENTIFIED. 2. Soft tissue, biopsy, common bile duct - SOFT TISSUE AND NECROSIS WITH ACUTE INFLAMMATION. - BENIGN PANCREATIC TISSUE. - NO MALIGNANCY IDENTIFIED.      09/05/2015 Imaging    CT ABD/PELVIS IMPRESSION: 1. No acute abnormality seen to explain the patient's symptoms. 2. Scattered pneumobilia is slightly more prominent, status post placement of a common bile duct stent. External biliary drainage catheter noted in expected position, adjacent to the upper aspect of the common bile duct stent. 3. Vague soft tissue edema about the pancreatic head and second segment of duodenum is relatively stable from the prior CT, and likely reflects the recent procedure. 4. Minimal diverticulosis at the cecum, without evidence of diverticulitis. 5. Mild degenerative change along the lumbar spine.       12/26/2015 Procedure    ERCP for stent replacement to bile duct stricture       12/26/2015 Pathology Results    BILE DUCT BRUSHING WITH STENT (SPECIMEN 1 OF 1, COLLECTED ON 12/26/15): NO MALIGNANT CELLS IDENTIFIED.      11/27/2016 Initial Biopsy    Pancreas, biopsy, mass Small focus of atypical glands, suspicious for infiltrating adenocarcinoma      11/27/2016 Procedure    POST-OPERATIVE DIAGNOSIS:  Gastric outlet obstruction  PROCEDURE:   GASTROJEJUNOSTOMY, BIOPSY PANCREATIC HEAD MASS       04/23/2017 Imaging    CT AP 04/23/17 IMPRESSION: 3.5 cm pancreatic head mass surrounding the common bile duct stent, consistent with pancreatic adenocarcinoma.  No evidence of metastatic disease within  the abdomen or pelvis.      05/03/2017 Initial Diagnosis    Pancreatic adenocarcinoma ([place])     05/13/2017 Imaging    CT Chest WO Contrast 05/13/17 IMPRESSION: No definite evidence of intrathoracic metastatic disease. Old granulomatous disease. Aortic  Atherosclerosis        05/19/2017 Pathology Results    Diagnosis 05/19/17 Pancreas, biopsy - ADENOCARCINOMA.  MMR was normal       HISTORY OF PRESENTING ILLNESS:   Summer Jones 83 y.o. female is here because of suspicion for pancreatic adenocarcinoma. She has been under the care of Dr. [name] since 2016. She was referred by Dr. [name], she presents to the clinic with her husband and 2 sons.  In 2016, constipation and scleral juandice prompted the patient to seek care with her PCP. She was found to have intrahepatic and extrahepatic biliary dilation on CT abd/pelvis on 08/06/2015. A percutaneous drainage tube was placed then later exchanged for biliary stent. Jaundice resolved at that time. The stent was replaced in 12/2015. She was hospitalized in April 2018 for constipation. She had one episode of nausea and vomiting on the way to the ER at that time. Surgical biopsy by Dr. [name] on 11/27/16 showed atypical cells concerning for pancreatic adenocarcinoma but EUS brushings by Dr. [name] showed no malignancy. In 04/2017 she developed bilateral lower abdominal pain and soreness, rating this 8/10, and constipation. She had minimal blood on tissue with wiping and has known hemorrhoids. CT abdomen and pelvis revealed 3.5 pancreatic head mass surrounding the common bile duct stent, consistent with pancreatic adenocarcinoma. For her pain, she takes ibuprofen but does not like to take medication due to chronic constipation. Associated with bloating, distention, and constipation. She uses miralax and prune juice. Last BM 05/04/17 which was small, soft and difficult. No nausea, vomiting, weight loss, or decreased appetite. However, she limits food due to fear of subsequent abdominal pain. She has minimal fatigue. Chronic dry cough from lisinopril.     CURRENT THERAPY: Supportive care    INTERVAL HISTORY:  RIDHI HOFFERT is here for a follow up. She presents to the clinic today accompanied by her family. She  reports her biopsy went well and went through her anterior side. She denies pain or bleeding post biopsy.  She notes the pain she gets is in her lower abdomen, in her intestinal area. She was taking medication which slows down her bowel but is concerned about that. She notes the stool softener she takes is in pills. She usually takes MiraLAX. Her stool is watery usually. She does not eat as much and she has lost weight. But she will try to increase. She opted for the flu shot today.    MEDICAL HISTORY:  Past Medical History:  Diagnosis Date  . Arthritis   . Common bile duct (CBD) obstruction   . Difficult intubation Anterior glottis, short TMD, limited mouth opening. Required glidescope and bougie stylet with multiple attempts.  . Diverticular disease   . Diverticulosis   . Essential hypertension   . Hypertension   . Hypothyroidism   . Jaundice   . Mitral valve prolapse   . Thyroid disease    SURGICAL HISTORY: Past Surgical History:  Procedure Laterality Date  . BREAST BIOPSY Right   . BREAST CYST EXCISION Right   . ENDOSCOPIC RETROGRADE CHOLANGIOPANCREATOGRAPHY (ERCP) WITH PROPOFOL N/A 08/08/2015   Procedure: ENDOSCOPIC RETROGRADE CHOLANGIOPANCREATOGRAPHY (ERCP) WITH PROPOFOL;  Surgeon: [name], MD;  Location: [facility];  Service: Endoscopy;  Laterality: N/A;  . 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 ESOPHAGOGASTRODUODENOSCOPY (EGD) WITH PROPOFOL N/A 11/25/2016   Procedure: ESOPHAGOGASTRODUODENOSCOPY (EGD) WITH PROPOFOL;  Surgeon: [name], MD;  Location: [facility];  Service: Endoscopy;  Laterality: N/A;  . EUS N/A 08/08/2015   Procedure: UPPER ENDOSCOPIC ULTRASOUND (EUS) RADIAL;  Surgeon: [name],  MD;  Location: [facility];  Service: Endoscopy;  Laterality: N/A;  . INGUINAL HERNIA REPAIR     right  . LAPAROTOMY N/A 11/27/2016   Procedure: GASTROJEJUNOSTOMY;  Surgeon: Leighton Ruff, MD;  Location: [facility];  Service: General;  Laterality: N/A;  . PANCREAS BIOPSY  11/27/2016   Procedure: PANCREACTIC BIOPSY;  Surgeon: Leighton Ruff, MD;  Location: [facility];  Service: General;;  . Bess Kinds CHOLANGIOSCOPY N/A 12/26/2015   Procedure: ZOXWRUEA CHOLANGIOSCOPY;  Surgeon: [name], MD;  Location: [facility];  Service: Endoscopy;  Laterality: N/A;  . TONSILLECTOMY     SOCIAL HISTORY: Social History   Social History  . Marital status: Married    Spouse name: N/A  . Number of children: 2  . Years of education: N/A   Occupational History  . Retired    Social History Main Topics  . Smoking status: Former Smoker    Packs/day: 1.00    Years: 10.00    Types: Cigarettes    Quit date: 7  . Smokeless tobacco: Never Used  . Alcohol use No     Comment: no recent   . Drug use: No  . Sexual activity: Not on file   Other Topics Concern  . Not on file   Social History Narrative  . No narrative on file   FAMILY HISTORY: Family History  Problem Relation Age of Onset  . Breast cancer Sister 20       treated with radiation  . Heart disease Father    ALLERGIES:  is allergic to sulfamethoxazole.  MEDICATIONS:  Current Outpatient Prescriptions  Medication Sig Dispense Refill  . amLODipine (NORVASC) 10 MG tablet Take 10 mg by mouth at bedtime.     . Cholecalciferol (VITAMIN D3) 400 units CAPS Take 2 capsules by mouth daily.    . hyoscyamine (LEVSIN, ANASPAZ) 0.125 MG tablet Take 1 tablet (0.125 mg total) by mouth 3 (three) times daily as needed. 90 tablet 1  . levothyroxine (SYNTHROID, LEVOTHROID) 25 MCG tablet Take 25 mcg by mouth daily before breakfast.    . lisinopril (PRINIVIL,ZESTRIL) 5 MG tablet Take 5 mg by mouth at bedtime.     . Multiple Vitamins-Minerals (MULTIVITAMIN  GUMMIES ADULT) CHEW Chew 1 tablet by mouth daily.    . polyethylene glycol powder (GLYCOLAX/MIRALAX) powder Take 17 g by mouth daily as needed for mild constipation.     . lipase/protease/amylase (CREON) 12000 units CPEP capsule Take 2 capsules (24,000 Units total) by mouth 3 (three) times daily before meals. 150 capsule 1  . polyvinyl alcohol (LIQUIFILM TEARS) 1.4 % ophthalmic solution Place 1 drop into both eyes daily as needed for dry eyes.     No current facility-administered medications for this visit.    REVIEW OF SYSTEMS:   Constitutional: Denies fevers, chills or abnormal night sweats. Stable weight (+) low appetite (+) weight loss Eyes: Denies blurriness of vision, double vision or watery eyes Ears, nose, mouth, throat, and  face: Denies mucositis or sore throat Respiratory: Denies dyspnea or wheezes (+) chronic dry cough, secondary to lisinopril Cardiovascular: Denies palpitation, chest discomfort or lower extremity swelling Gastrointestinal:  Denies nausea, heartburn or change in bowel habits (+) constipation (+) diffuse lower abdominal pain, bilateral Skin: Denies abnormal skin rashes Lymphatics: Denies new lymphadenopathy or easy bruising Neurological:Denies numbness, tingling or new weaknesses Behavioral/Psych: Mood is stable, no new changes  All other systems were reviewed with the patient and are negative.  PHYSICAL EXAMINATION:  ECOG PERFORMANCE STATUS: 1 - Symptomatic but completely ambulatory  Vitals:   05/25/17 1416  BP: 140/61  Pulse: 70  Resp: 20  Temp: 98.4 F (36.9 C)  SpO2: 99%   Filed Weights   05/25/17 1416  Weight: 86 lb 4.8 oz (39.1 kg)     GENERAL:alert, no distress and comfortable SKIN: skin color, texture, turgor are normal, no rashes or significant lesions EYES: normal, conjunctiva are pink and non-injected, sclera clear OROPHARYNX:no exudate, no erythema and lips, buccal mucosa, and tongue normal  NECK: supple, thyroid normal size, non-tender,  without nodularity LYMPH:  no palpable lymphadenopathy in the cervical, axillary or inguinal LUNGS: clear to auscultation and percussion with normal breathing effort HEART: regular rate & rhythm and no murmurs and no lower extremity edema ABDOMEN:abdomen soft and normal bowel sounds. RUQ tenderness to palpation. No palpable organomegaly or masses. Musculoskeletal:no cyanosis of digits and no clubbing  PSYCH: alert & oriented x 3 with fluent speech NEURO: no focal motor/sensory deficits  LABORATORY DATA:  I have reviewed the data as listed CBC Latest Ref Rng & Units 05/19/2017 11/30/2016 11/28/2016  WBC 4.0 - 10.5 K/uL 5.3 7.0 12.5(H)  Hemoglobin 12.0 - 15.0 g/dL 12.8 10.4(L) 12.7  Hematocrit 36.0 - 46.0 % 37.7 30.6(L) 36.7  Platelets 150 - 400 K/uL 309 246 262   CMP Latest Ref Rng & Units 04/28/2017 04/20/2017 12/01/2016  Glucose 70 - 99 mg/dL 108(H) 113(H) 116(H)  BUN 6 - 23 mg/dL 9 10 <5(L)  Creatinine 0.40 - 1.20 mg/dL 0.66 0.63 0.42(L)  Sodium 135 - 145 mEq/L 130(L) 132(L) 137  Potassium 3.5 - 5.1 mEq/L 4.0 4.7 3.7  Chloride 96 - 112 mEq/L 95(L) 97 104  CO2 19 - 32 mEq/L _0 Calcium 8.4 - 10.5 mg/dL 10.5 10.0 8.1(L)  Total Protein 6.0 - 8.3 g/dL - 7.3 -  Total Bilirubin 0.2 - 1.2 mg/dL - 0.6 -  Alkaline Phos 39 - 117 U/L - 71 -  AST 0 - 37 U/L - 23 -  ALT 0 - 35 U/L - 18 -   PATHOLOGY  Diagnosis 05/19/17 Pancreas, biopsy - ADENOCARCINOMA. - SEE COMMENT. Microscopic Comment Dr. Claudette Laws has reviewed the case and concurs with this interpretation. Dr. Burr Medico was paged on 05/20/2017. Per request, MMR by [facility] testing will be performed and the results reported separately. (JBK:ah 05/20/17)   Diagnosis 11/27/2016 Pancreas, biopsy, mass SMALL FOCUS OF ATYPICAL GLANDS, SUSPICIOUS FOR INFILTRATING ADENOCARCINOMA. Microscopic Comment Multiple levels of tissue has been exam, due to limited atypical cells, a definitive diagnosis can not be reached. This case also reviewed by Drs.  Saralyn Pilar and Smir and agree.  RADIOGRAPHIC STUDIES: I have personally reviewed the radiological images as listed and agreed with the findings in the report. Ct Chest Wo Contrast  Result Date: 05/13/2017 CLINICAL DATA:  Pancreatic adenocarcinoma question metastatic disease EXAM: CT CHEST WITHOUT CONTRAST TECHNIQUE: Multidetector CT imaging of the chest was performed following the standard protocol without IV contrast. Sagittal  and coronal MPR images reconstructed from axial data set. COMPARISON:  None FINDINGS: Cardiovascular: Atherosclerotic calcification thoracic aorta. Aorta normal caliber without aneurysm. No pericardial effusion. Mediastinum/Nodes: Esophagus unremarkable. Tiny nonspecific nodule at inferior RIGHT thyroid lobe 6 mm diameter. Base of cervical region otherwise unremarkable. No thoracic adenopathy. Lungs/Pleura: Minimal biapical scarring and interseptal thickening. Tiny calcified granulomata RIGHT lung. Lungs otherwise clear. No acute infiltrate, pleural effusion or pneumothorax. Upper Abdomen: CBD stent within pancreas. Pneumobilia. Pancreatic head is ill defined with pancreatic ductal dilatation. Musculoskeletal: Degenerative disc disease changes at thoracolumbar junction region. IMPRESSION: No definite evidence of intrathoracic metastatic disease. Old granulomatous disease. Aortic Atherosclerosis (ICD10-I70.0). Electronically Signed   By: Lavonia Dana M.D.   On: 05/13/2017 17:18   Ct Biopsy  Result Date: 05/19/2017 CLINICAL DATA:  Mass of head of the pancreas with biliary obstruction and status post prior common bile duct stent placement. Prior cytology and biopsy suspicious for malignancy but inconclusive. The patient has been referred for percutaneous biopsy of the pancreatic head mass. EXAM: CT GUIDED CORE BIOPSY OF PANCREATIC MASS COMPARISON:  CT of the abdomen on 04/23/2017 ANESTHESIA/SEDATION: 2.0 mg IV Versed; 50 mcg IV Fentanyl Total Moderate Sedation Time:  18 minutes. The  patient's level of consciousness and physiologic status were continuously monitored during the procedure by Radiology nursing. PROCEDURE: The procedure risks, benefits, and alternatives were explained to the patient. Questions regarding the procedure were encouraged and answered. The patient understands and consents to the procedure. A time-out was performed prior to initiating the procedure. The abdominal wall was prepped with chlorhexidine in a sterile fashion, and a sterile drape was applied covering the operative field. A sterile gown and sterile gloves were used for the procedure. Local anesthesia was provided with 1% Lidocaine. CT was performed through the abdomen in a supine position. Under CT guidance, a 17 gauge trocar needle was advanced to the level of the lateral pancreatic head. After confirming needle tip position, 2 separate coaxial 18 gauge core biopsy tissue samples were obtained. These were submitted in formalin. Post biopsy CT was then performed after outer needle removal. COMPLICATIONS: None FINDINGS: The region of the pancreatic head lateral to the common bile duct stent was targeted where CT is most suspicious for the presence an ill-defined mass. Biopsy samples reveals solid tissue. CT after biopsy shows a minimal amount of probable hemorrhage just deep to the abdominal wall without significant post biopsy hemorrhage identified. No free air was present. IMPRESSION: CT-guided core biopsy performed of pancreatic head mass at the level of the lateral pancreatic head. Electronically Signed   By: Aletta Edouard M.D.   On: 05/19/2017 18:16   ASSESSMENT & PLAN: Karena Kinker is a 83 y.o.  female with arthritis, HTN, thyroid disease, history of bilary duct stent, and pancreas mass suspicious for adenocarcinoma.   1. Pancreas mass suspicious for primary pancreatic adenocarcinoma -I reviewed the patient's medical records extensively with the patient and family.  -She had a multiple bile duct  brushings, cytologies were negative. Her last fine-needle biopsy of the pancreatic mass during her gastric bypass surgery for gastric outlet obstructions in April 2018 showed small cluster of atypical cells, suspicious for adenocarcinoma, but not definitive. -Unfortunately we do not have a definitive diagnosis but the biopsy results and her clinical picture are concerning for pancreatic adenocarcinoma. Hers appears to be indolent, however, over the past two years.  -If this confirms pancreatic adenocarcinoma, surgery is not a likely option due to the location of her tumor and her  advanced age, although there is no clear vascular invasion by the tumor on the CT scan.  -The goal of therapy is palliative, if no surgery. We discussed various options  -Chemotherapy is a possible option to control symptoms and improve quality of life, such as single agent Gemcitabine  -Radiation is a good option if pain increases and she does not want pain medication due to chronic constipation; I discussed possible side effects of skin change, nausea, diarrhea, fatigue are common side effects -I previously discussed potential for celiac nerve block to improve pain if oral agents are not sufficient -I previously discussed palliative and comfort care in the event the patient does not elect active treatment -Patient and her family voiced good understanding about the options, and she is interested in follow-up with [place] for palliative care alone in the future -I discussed the 05/19/17 pancreas biopsy which showed adenocarcinoma. I discussed that her MSSR came back normal which means she is not eligable for immunotherapy as it will not help control her disease.  -I discussed we will do scans if she develops worsening or new symptoms. She knows to contact us if she experiences these symptomes.  -I encouraged her to eat more to take more ensure boost to supplement her diet.  -f/u in 4-6 weeks  -I offered flu shot and she opted for it  today   2. Abdominal pain -Likely secondary to the pancreatic tumor, although her pain is located in the low abdomen, possible related to her bowel some spasm -We previously discussed options of pain medicines, patient declined due to her concern of constipation -She has taken chewing ibuprofen  -Dr. [name] prescribed her hyoscyamine  -I suggest starting with liquid tylenol 320-645m for adult dose. It not enough I will order her Tylenol #3.  -I discussed pain medication can cause constipation so she would watch her BMs.  - I also suggest Pancreatic enzyme Supplement, Creon, 2400U before each meal to help her GI system.  3. Chronic constipation -We discussed constipation management, I encouraged her to take a stool softener, and increase MiraLAX dose -We previously discussed other laxatives options, such as magnesium citrate for severe constipation -I suggested Pancreatic enzyme Supplement, Creon, to help her GI system. She will try for a week as prescribe to see if this works for her.   4. Hypertension, hypothyroidism -Follow up with primary care physician  Plan: -Order Pancreatic Enzyme Supplement, Creon, 1200U, 1-2 cap before each meal,  to try for at least a week to see if that helps with her abdominal pain  -Lab and f/u in 4-6 weeks  -Flu shot today    No orders of the defined types were placed in this encounter.   All questions were answered. The patient knows to call the clinic with any problems, questions or concerns.  I spent 25 counseling the patient face to face. The total time spent in the appointment was 30 and more than 50% was on counseling.   FTruitt Merle MD 05/25/2017 4:59 PM   This document serves as a record of services personally performed by YTruitt Merle MD. It was created on her behalf by AJoslyn Devon a trained medical scribe. The creation of this record is based on the scribe's personal observations and the provider's statements to them. This document has been  checked and approved by the attending provider.

## 2017-05-25 NOTE — Telephone Encounter (Signed)
Scheduled appt per 10/9 los - Gave patient AVS and calender per los.  

## 2017-05-27 ENCOUNTER — Telehealth: Payer: Self-pay | Admitting: *Deleted

## 2017-05-27 NOTE — Telephone Encounter (Signed)
Pt called with question about Creon script.  Spoke with pt and was informed that pt took 1 dose of Creon on Tuesday, 2 doses on Wed, and has not taken any Creon today.  Pt stated since starting on this med, pt has had several normal bms frequently.  Denied pain, denied bloating. Wanted to know if pt should continue with Creon.  Dr. Burr Medico notified. Spoke with pt that it is ok to take 1 dose today.  Pt can take 1 Creon capsule BID starting tomorrow.  Asked if pt noticed abdominal pain was any better.  Pt stated she was too concerned about frequent normal bms, she did not pay attention to abdominal pain.  However, pt stated no worsening pain noted.  Pt understood to call office back with any new symptoms.

## 2017-06-28 ENCOUNTER — Ambulatory Visit (HOSPITAL_BASED_OUTPATIENT_CLINIC_OR_DEPARTMENT_OTHER): Payer: Medicare Other | Admitting: Hematology

## 2017-06-28 ENCOUNTER — Other Ambulatory Visit (HOSPITAL_BASED_OUTPATIENT_CLINIC_OR_DEPARTMENT_OTHER): Payer: Medicare Other

## 2017-06-28 ENCOUNTER — Encounter: Payer: Self-pay | Admitting: Hematology

## 2017-06-28 ENCOUNTER — Telehealth: Payer: Self-pay | Admitting: Hematology

## 2017-06-28 VITALS — BP 133/54 | HR 59 | Temp 97.8°F | Resp 16 | Ht 59.0 in | Wt 87.8 lb

## 2017-06-28 DIAGNOSIS — C259 Malignant neoplasm of pancreas, unspecified: Secondary | ICD-10-CM

## 2017-06-28 DIAGNOSIS — K5909 Other constipation: Secondary | ICD-10-CM

## 2017-06-28 DIAGNOSIS — E039 Hypothyroidism, unspecified: Secondary | ICD-10-CM | POA: Diagnosis not present

## 2017-06-28 DIAGNOSIS — R197 Diarrhea, unspecified: Secondary | ICD-10-CM

## 2017-06-28 DIAGNOSIS — R1011 Right upper quadrant pain: Secondary | ICD-10-CM | POA: Diagnosis not present

## 2017-06-28 DIAGNOSIS — I10 Essential (primary) hypertension: Secondary | ICD-10-CM | POA: Diagnosis not present

## 2017-06-28 LAB — CBC WITH DIFFERENTIAL/PLATELET
BASO%: 0.4 % (ref 0.0–2.0)
Basophils Absolute: 0 10*3/uL (ref 0.0–0.1)
EOS%: 2.8 % (ref 0.0–7.0)
Eosinophils Absolute: 0.1 10*3/uL (ref 0.0–0.5)
HCT: 37.7 % (ref 34.8–46.6)
HEMOGLOBIN: 12.2 g/dL (ref 11.6–15.9)
LYMPH%: 19.1 % (ref 14.0–49.7)
MCH: 30.5 pg (ref 25.1–34.0)
MCHC: 32.4 g/dL (ref 31.5–36.0)
MCV: 94.3 fL (ref 79.5–101.0)
MONO#: 0.6 10*3/uL (ref 0.1–0.9)
MONO%: 12.2 % (ref 0.0–14.0)
NEUT%: 65.5 % (ref 38.4–76.8)
NEUTROS ABS: 3.3 10*3/uL (ref 1.5–6.5)
PLATELETS: 302 10*3/uL (ref 145–400)
RBC: 4 10*6/uL (ref 3.70–5.45)
RDW: 14.2 % (ref 11.2–14.5)
WBC: 5 10*3/uL (ref 3.9–10.3)
lymph#: 1 10*3/uL (ref 0.9–3.3)

## 2017-06-28 LAB — COMPREHENSIVE METABOLIC PANEL
ALBUMIN: 4.3 g/dL (ref 3.5–5.0)
ALK PHOS: 81 U/L (ref 40–150)
ALT: 24 U/L (ref 0–55)
ANION GAP: 8 meq/L (ref 3–11)
AST: 27 U/L (ref 5–34)
BILIRUBIN TOTAL: 0.37 mg/dL (ref 0.20–1.20)
BUN: 8.7 mg/dL (ref 7.0–26.0)
CALCIUM: 9.4 mg/dL (ref 8.4–10.4)
CHLORIDE: 102 meq/L (ref 98–109)
CO2: 25 mEq/L (ref 22–29)
CREATININE: 0.7 mg/dL (ref 0.6–1.1)
EGFR: >60 mL/min/{1.73_m2} (ref >60)
Glucose: 103 mg/dl (ref 70–140)
Potassium: 4 mEq/L (ref 3.5–5.1)
Sodium: 135 mEq/L — ABNORMAL LOW (ref 136–145)
TOTAL PROTEIN: 7.5 g/dL (ref 6.4–8.3)

## 2017-06-28 MED ORDER — TRAMADOL 5 MG/ML ORAL SUSPENSION: 25.0000 mg | Freq: Four times a day (QID) | ORAL | 0 refills | Status: DC

## 2017-06-28 NOTE — Telephone Encounter (Signed)
Gave avs and calendar for December  °

## 2017-06-28 NOTE — Progress Notes (Addendum)
[name]  [phone] [phone]  Clinic Follow up Note   Patient Care Team: Crist Infante, MD as PCP - General (Internal Medicine) [name], MD as Attending Physician (Gastroenterology) Truitt Merle, MD as Consulting Physician (Hematology) 06/28/2017  SUMMARY OF ONCOLOGIC HISTORY: Oncology History   Cancer Staging Pancreatic adenocarcinoma [facility]) Staging form: Exocrine Pancreas, AJCC 8th Edition - Clinical stage from 05/19/2017: Stage IB (cT2, cN0, cM0) - Signed by Truitt Merle, MD on 05/25/2017       Pancreatic adenocarcinoma ([place])   08/06/2015 Imaging    CT ABD/PELVIS IMPRESSION: 1. Intra and extrahepatic biliary dilatation with an abrupt cut off of the common bile duct near its entry into the pancreatic head. Findings most suspicious for a biliary tumor (cholangiocarcinoma) but a common bile duct stone and pancreatic lesion are also possible. MRCP may be helpful for further evaluation if the patient can hold her breath. Otherwise, ERCP is suggested for both diagnostic and therapeutic purposes. 2. Moderate distention of the gallbladder without obvious gallstones. 3. No liver lesions or abdominal lymphadenopathy.       08/08/2015 Procedure    EUS and ERCP per Dr. [name]: indicated for painless juandice, dilated biliary tree  Very vaguely abnormal region in the pancreatic head. This was hypoechoic and SOMEWHAT masslike, measured 2cm Maximally.  2. CBD was dilated up to 34m, tapered abruptly in the head of pancreas. 3. Main pancreatic duct was non-dilated 4. No peripancreatic adenopathy.      08/08/2015 Pathology Results    EUS path: Common Bile Duct , bx SMALL FRAGMENT OF BILE DUCT EPITHELIUM, PERIDUCTAL GLANDS AND FIBROMUSCULAR TISSUE NEGATIVE FOR MALIGNANCY      08/08/2015 Procedure    IR biliary stent placement       09/05/2015 Pathology Results    1. Soft tissue, biopsy, common bile duct - SCANT SOFT TISSUE AND  NECROSIS WITH ACUTE INFLAMMATION. - SCANT BENIGN GLANDULAR EPITHLEIUM. - NO MALIGNANCY IDENTIFIED. 2. Soft tissue, biopsy, common bile duct - SOFT TISSUE AND NECROSIS WITH ACUTE INFLAMMATION. - BENIGN PANCREATIC TISSUE. - NO MALIGNANCY IDENTIFIED.      09/05/2015 Imaging    CT ABD/PELVIS IMPRESSION: 1. No acute abnormality seen to explain the patient's symptoms. 2. Scattered pneumobilia is slightly more prominent, status post placement of a common bile duct stent. External biliary drainage catheter noted in expected position, adjacent to the upper aspect of the common bile duct stent. 3. Vague soft tissue edema about the pancreatic head and second segment of duodenum is relatively stable from the prior CT, and likely reflects the recent procedure. 4. Minimal diverticulosis at the cecum, without evidence of diverticulitis. 5. Mild degenerative change along the lumbar spine.       12/26/2015 Procedure    ERCP for stent replacement to bile duct stricture       12/26/2015 Pathology Results    BILE DUCT BRUSHING WITH STENT (SPECIMEN 1 OF 1, COLLECTED ON 12/26/15): NO MALIGNANT CELLS IDENTIFIED.      11/27/2016 Initial Biopsy    Pancreas, biopsy, mass Small focus of atypical glands, suspicious for infiltrating adenocarcinoma      11/27/2016 Procedure    POST-OPERATIVE DIAGNOSIS:  Gastric outlet obstruction  PROCEDURE:   GASTROJEJUNOSTOMY, BIOPSY PANCREATIC HEAD MASS       04/23/2017 Imaging    CT AP 04/23/17 IMPRESSION: 3.5 cm pancreatic head mass surrounding the common bile duct stent, consistent with pancreatic adenocarcinoma.  No evidence of metastatic disease within the abdomen or pelvis.  05/03/2017 Initial Diagnosis    Pancreatic adenocarcinoma ([place])      05/13/2017 Imaging    CT Chest WO Contrast 05/13/17 IMPRESSION: No definite evidence of intrathoracic metastatic disease. Old granulomatous disease. Aortic Atherosclerosis        05/19/2017 Pathology  Results    Diagnosis 05/19/17 Pancreas, biopsy - ADENOCARCINOMA.  MMR was normal      CURRENT THERAPY: Supportive care   INTERVAL HISTORY: Summer Jones returns for f/u as scheduled, last seen 1 month ago. She has been doing well in the interim, no new changes. She continues to have intermittent mid abdominal pain that became more severe over last few days. Past Friday it was severe 8/10 but she has rarely required tylenol. She is not interested in opioid pain management. Abd discomfort is not linked to bowel movements, she fluctuates between constipation and diarrhea. Takes miralax and prune juice when constipated. Occasionally feels bloated, no nausea or vomiting. Currently taking Creon BID but notes pain is improved when taking enzymes. Levsin PRN helps with intestinal spasm.   REVIEW OF SYSTEMS:   Constitutional: Denies fatigue, fevers, chills or abnormal weight loss Eyes: Denies blurriness of vision Ears, nose, mouth, throat, and face: Denies mucositis or sore throat Respiratory: Denies cough, dyspnea or wheezes Cardiovascular: Denies palpitation, chest discomfort or lower extremity swelling Gastrointestinal:  Denies nausea, heartburn or change in bowel habits (+) alternates constipation and diarrhea (+) diffuse abdominal pain, improves with pancreatic enzymes (+) intermittent bloating Skin: Denies abnormal skin rashes Lymphatics: Denies new lymphadenopathy or easy bruising Neurological:Denies numbness, tingling or new weaknesses Behavioral/Psych: Mood is stable, no new changes  All other systems were reviewed with the patient and are negative.  MEDICAL HISTORY:  Past Medical History:  Diagnosis Date  . Arthritis   . Common bile duct (CBD) obstruction   . Difficult intubation Anterior glottis, short TMD, limited mouth opening. Required glidescope and bougie stylet with multiple attempts.  . Diverticular disease   . Diverticulosis   . Essential hypertension   . Hypertension   .  Hypothyroidism   . Jaundice   . Mitral valve prolapse   . Thyroid disease     SURGICAL HISTORY: Past Surgical History:  Procedure Laterality Date  . BREAST BIOPSY Right   . BREAST CYST EXCISION Right   . INGUINAL HERNIA REPAIR     right  . TONSILLECTOMY      I have reviewed the social history and family history with the patient and they are unchanged from previous note.  ALLERGIES:  is allergic to sulfamethoxazole.  MEDICATIONS:  Current Outpatient Medications  Medication Sig Dispense Refill  . amLODipine (NORVASC) 10 MG tablet Take 10 mg by mouth at bedtime.     . Cholecalciferol (VITAMIN D3) 400 units CAPS Take 2 capsules by mouth daily.    . hyoscyamine (LEVSIN, ANASPAZ) 0.125 MG tablet Take 1 tablet (0.125 mg total) by mouth 3 (three) times daily as needed. 90 tablet 1  . levothyroxine (SYNTHROID, LEVOTHROID) 25 MCG tablet Take 25 mcg by mouth daily before breakfast.    . lipase/protease/amylase (CREON) 12000 units CPEP capsule Take 2 capsules (24,000 Units total) by mouth 3 (three) times daily before meals. 150 capsule 1  . lisinopril (PRINIVIL,ZESTRIL) 5 MG tablet Take 5 mg by mouth at bedtime.     . Multiple Vitamins-Minerals (MULTIVITAMIN GUMMIES ADULT) CHEW Chew 1 tablet by mouth daily.    . polyvinyl alcohol (LIQUIFILM TEARS) 1.4 % ophthalmic solution Place 1 drop into both eyes daily as  needed for dry eyes.    . polyethylene glycol powder (GLYCOLAX/MIRALAX) powder Take 17 g by mouth daily as needed for mild constipation.     . traMADol (ULTRAM) 5 mg/mL SUSP Take 5 mLs (25 mg total) every 6 (six) hours by mouth. 100 mL 0   No current facility-administered medications for this visit.     PHYSICAL EXAMINATION: ECOG PERFORMANCE STATUS: 1 - Symptomatic but completely ambulatory  Vitals:   06/28/17 1452  BP: (!) 133/54  Pulse: (!) 59  Resp: 16  Temp: 97.8 F (36.6 C)  SpO2: 100%   Filed Weights   06/28/17 1452  Weight: 87 lb 12.8 oz (39.8 kg)     GENERAL:alert, no distress and comfortable SKIN: skin color, texture, turgor are normal, no rashes or significant lesions EYES: normal, Conjunctiva are pink and non-injected, sclera clear OROPHARYNX:no exudate, no erythema and lips, buccal mucosa, and tongue normal  NECK: supple, thyroid normal size, non-tender, without nodularity LYMPH:  no palpable cervical or supraclavicular lymphadenopathy LUNGS: clear to auscultation bilaterally with normal breathing effort HEART: regular rate & rhythm and no murmurs and no lower extremity edema ABDOMEN:abdomen soft and normal bowel sounds (+) tenderness to RUQ Musculoskeletal:no cyanosis of digits and no clubbing  NEURO: alert & oriented x 3 with fluent speech, no focal motor/sensory deficits  LABORATORY DATA:  I have reviewed the data as listed CBC Latest Ref Rng & Units 06/28/2017 05/19/2017 11/30/2016  WBC 3.9 - 10.3 10e3/uL 5.0 5.3 7.0  Hemoglobin 11.6 - 15.9 g/dL 12.2 12.8 10.4(L)  Hematocrit 34.8 - 46.6 % 37.7 37.7 30.6(L)  Platelets 145 - 400 10e3/uL 302 309 246     CMP Latest Ref Rng & Units 06/28/2017 04/28/2017 04/20/2017  Glucose 70 - 140 mg/dl 103 108(H) 113(H)  BUN 7.0 - 26.0 mg/dL 8.'7 9 10  '$ Creatinine 0.6 - 1.1 mg/dL 0.7 0.66 0.63  Sodium 136 - 145 mEq/L 135(L) 130(L) 132(L)  Potassium 3.5 - 5.1 mEq/L 4.0 4.0 4.7  Chloride 96 - 112 mEq/L - 95(L) 97  CO2 22 - 29 mEq/L '25 26 24  '$ Calcium 8.4 - 10.4 mg/dL 9.4 10.5 10.0  Total Protein 6.4 - 8.3 g/dL 7.5 - 7.3  Total Bilirubin 0.20 - 1.20 mg/dL 0.37 - 0.6  Alkaline Phos 40 - 150 U/L 81 - 71  AST 5 - 34 U/L 27 - 23  ALT 0 - 55 U/L 24 - 18    RADIOGRAPHIC STUDIES: I have personally reviewed the radiological images as listed and agreed with the findings in the report. No results found.   ASSESSMENT & PLAN: Summer Jones is a 83 y.o.  female with arthritis, HTN, thyroid disease, history of bilary duct stent, and pancreas mass suspicious for adenocarcinoma.   1. Pancreatic  adenocarcinoma 2. Abdominal pain 3. Chronic constipation 4. Hypertension, hypothyroidism   Ms. Roddey appears stable today. Her pain is overall unchanged, she takes liquid tylenol PRN but does experience occasional severe pain. Has RUQ tenderness on palpation. We reviewed local therapies, including palliative radiation or celiac nerve block. She experienced some relief with pancreatic enzymes, taking BID currently. She will try increasing Creon to TID. Gave Rx for tramadol 25 mg liquid PRN; she will try these first prior to more invasive approach. CBC and Cmet are unremarkable. She will return in 6 weeks for lab and f/u, but knows to call sooner with new or worsening concerns.   PLAN: Continue tylenol PRN, Rx given for tramadol PRN for moderate to severe pain Consider  IR celiac nerve block or palliative radiation to pancreatic head mass if pain refractory to medication   Increase creon to TID to help improve abd discomfort and digestion   All questions were answered. The patient knows to call the clinic with any problems, questions or concerns. No barriers to learning was detected.     Alla Feeling, NP 06/28/17   Addendum I have seen the patient, examined her. I agree with the assessment and and plan and have edited the notes.   Summer Jones is clinically stable.  Her abdominal pain overall is mild, not taking much pain medication, except one episode of severe pain last week.  We discussed the indication for celiac block or palliative radiation, if her abdominal pain gets much worse.  I gave her a prescription of tramadol today in case she, has worsening pain.  Continue observation and supportive care.  Truitt Merle  06/28/2017

## 2017-06-29 LAB — CANCER ANTIGEN 19-9: CA 19-9: 671 U/mL — ABNORMAL HIGH (ref 0–35)

## 2017-07-29 ENCOUNTER — Other Ambulatory Visit: Payer: Self-pay | Admitting: *Deleted

## 2017-07-29 MED ORDER — PANCRELIPASE (LIP-PROT-AMYL) 12000-38000 UNITS PO CPEP: 24000.0000 [IU] | ORAL_CAPSULE | Freq: Three times a day (TID) | ORAL | 1 refills | Status: AC

## 2017-08-15 NOTE — Progress Notes (Signed)
[place]  [phone] [phone]  Clinic Follow Up Note   Patient Care Team: Crist Infante, MD as PCP - General (Internal Medicine) [name], MD as Attending Physician (Gastroenterology) Truitt Merle, MD as Consulting Physician (Hematology) 08/16/2017  CHIEF COMPLAINTS:  F/u  pancreatic adenocarcinoma.  Oncology History   Cancer Staging Pancreatic adenocarcinoma Summer Jones) Staging form: Exocrine Pancreas, AJCC 8th Edition - Clinical stage from 05/19/2017: Stage IB (cT2, cN0, cM0) - Signed by Truitt Merle, MD on 05/25/2017       Pancreatic adenocarcinoma ([place])   08/06/2015 Imaging    CT ABD/PELVIS IMPRESSION: 1. Intra and extrahepatic biliary dilatation with an abrupt cut off of the common bile duct near its entry into the pancreatic head. Findings most suspicious for a biliary tumor (cholangiocarcinoma) but a common bile duct stone and pancreatic lesion are also possible. MRCP may be helpful for further evaluation if the patient can hold her breath. Otherwise, ERCP is suggested for both diagnostic and therapeutic purposes. 2. Moderate distention of the gallbladder without obvious gallstones. 3. No liver lesions or abdominal lymphadenopathy.       08/08/2015 Procedure    EUS and ERCP per Dr. [name]: indicated for painless juandice, dilated biliary tree  Very vaguely abnormal region in the pancreatic head. This was hypoechoic and SOMEWHAT masslike, measured 2cm Maximally.  2. CBD was dilated up to 41m, tapered abruptly in the head of pancreas. 3. Main pancreatic duct was non-dilated 4. No peripancreatic adenopathy.      08/08/2015 Pathology Results    EUS path: Common Bile Duct , bx SMALL FRAGMENT OF BILE DUCT EPITHELIUM, PERIDUCTAL GLANDS AND FIBROMUSCULAR TISSUE NEGATIVE FOR MALIGNANCY      08/08/2015 Procedure    IR biliary stent placement       09/05/2015 Pathology Results    1. Soft tissue, biopsy, common bile  duct - SCANT SOFT TISSUE AND NECROSIS WITH ACUTE INFLAMMATION. - SCANT BENIGN GLANDULAR EPITHLEIUM. - NO MALIGNANCY IDENTIFIED. 2. Soft tissue, biopsy, common bile duct - SOFT TISSUE AND NECROSIS WITH ACUTE INFLAMMATION. - BENIGN PANCREATIC TISSUE. - NO MALIGNANCY IDENTIFIED.      09/05/2015 Imaging    CT ABD/PELVIS IMPRESSION: 1. No acute abnormality seen to explain the patient's symptoms. 2. Scattered pneumobilia is slightly more prominent, status post placement of a common bile duct stent. External biliary drainage catheter noted in expected position, adjacent to the upper aspect of the common bile duct stent. 3. Vague soft tissue edema about the pancreatic head and second segment of duodenum is relatively stable from the prior CT, and likely reflects the recent procedure. 4. Minimal diverticulosis at the cecum, without evidence of diverticulitis. 5. Mild degenerative change along the lumbar spine.       12/26/2015 Procedure    ERCP for stent replacement to bile duct stricture       12/26/2015 Pathology Results    BILE DUCT BRUSHING WITH STENT (SPECIMEN 1 OF 1, COLLECTED ON 12/26/15): NO MALIGNANT CELLS IDENTIFIED.      11/27/2016 Initial Biopsy    Pancreas, biopsy, mass Small focus of atypical glands, suspicious for infiltrating adenocarcinoma      11/27/2016 Procedure    POST-OPERATIVE DIAGNOSIS:  Gastric outlet obstruction  PROCEDURE:   GASTROJEJUNOSTOMY, BIOPSY PANCREATIC HEAD MASS       04/23/2017 Imaging    CT AP 04/23/17 IMPRESSION: 3.5 cm pancreatic head mass surrounding the common bile duct stent, consistent with pancreatic adenocarcinoma.  No evidence of metastatic disease within the abdomen  or pelvis.      05/03/2017 Initial Diagnosis    Pancreatic adenocarcinoma ([place])      05/13/2017 Imaging    CT Chest WO Contrast 05/13/17 IMPRESSION: No definite evidence of intrathoracic metastatic disease. Old granulomatous disease. Aortic Atherosclerosis         05/19/2017 Pathology Results    Diagnosis 05/19/17 Pancreas, biopsy - ADENOCARCINOMA.  MMR was normal       HISTORY OF PRESENTING ILLNESS:   Summer Jones 83 y.o. female is here because of suspicion for pancreatic adenocarcinoma. She has been under the care of Dr. [name] since 2016. She was referred by Dr. [name], she presents to the clinic with her husband and 2 sons.  In 2016, constipation and scleral juandice prompted the patient to seek care with her PCP. She was found to have intrahepatic and extrahepatic biliary dilation on CT abd/pelvis on 08/06/2015. A percutaneous drainage tube was placed then later exchanged for biliary stent. Jaundice resolved at that time. The stent was replaced in 12/2015. She was hospitalized in April 2018 for constipation. She had one episode of nausea and vomiting on the way to the ER at that time. Surgical biopsy by Dr. [name] on 11/27/16 showed atypical cells concerning for pancreatic adenocarcinoma but EUS brushings by Dr. [name] showed no malignancy. In 04/2017 she developed bilateral lower abdominal pain and soreness, rating this 8/10, and constipation. She had minimal blood on tissue with wiping and has known hemorrhoids. CT abdomen and pelvis revealed 3.5 pancreatic head mass surrounding the common bile duct stent, consistent with pancreatic adenocarcinoma. For her pain, she takes ibuprofen but does not like to take medication due to chronic constipation. Associated with bloating, distention, and constipation. She uses miralax and prune juice. Last BM 05/04/17 which was small, soft and difficult. No nausea, vomiting, weight loss, or decreased appetite. However, she limits food due to fear of subsequent abdominal pain. She has minimal fatigue. Chronic dry cough from lisinopril.     CURRENT THERAPY: Supportive care    INTERVAL HISTORY:  Summer Jones is here for a follow up. She presents to the clinic today accompanied by her husband.  Been doing well  overall, she has only had one episode of severe abdominal pain in the past 2 months, resolved spontaneously after a few hours.  I gave her a prescription of tramadol on last visit, but her pharmacy does not have the liquid format.  She has been taking Creon, but can only tolerate 2 capsule a day.   she is slightly constipated, but could not take stool softener or laxative pill, I suggested her to try MiraLAX powder.  Otherwise feels well, denies any abdominal bloating, nausea, her appetite and energy level has remained to be well, her weight is stable.   MEDICAL HISTORY:  Past Medical History:  Diagnosis Date  . Arthritis   . Common bile duct (CBD) obstruction   . Difficult intubation Anterior glottis, short TMD, limited mouth opening. Required glidescope and bougie stylet with multiple attempts.  . Diverticular disease   . Diverticulosis   . Essential hypertension   . Hypertension   . Hypothyroidism   . Jaundice   . Mitral valve prolapse   . Thyroid disease    SURGICAL HISTORY: Past Surgical History:  Procedure Laterality Date  . BREAST BIOPSY Right   . BREAST CYST EXCISION Right   . ENDOSCOPIC RETROGRADE CHOLANGIOPANCREATOGRAPHY (ERCP) WITH PROPOFOL N/A 08/08/2015   Procedure: ENDOSCOPIC RETROGRADE CHOLANGIOPANCREATOGRAPHY (ERCP) WITH PROPOFOL;  Surgeon: Quillian Quince  Merrily Brittle, MD;  Location: [facility];  Service: Endoscopy;  Laterality: N/A;  . 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 ESOPHAGOGASTRODUODENOSCOPY (EGD) WITH PROPOFOL N/A 11/25/2016   Procedure: ESOPHAGOGASTRODUODENOSCOPY (EGD) WITH PROPOFOL;  Surgeon: [name], MD;  Location: [facility];  Service: Endoscopy;  Laterality: N/A;  . EUS N/A 08/08/2015   Procedure:  UPPER ENDOSCOPIC ULTRASOUND (EUS) RADIAL;  Surgeon: [name], MD;  Location: [facility];  Service: Endoscopy;  Laterality: N/A;  . INGUINAL HERNIA REPAIR     right  . LAPAROTOMY N/A 11/27/2016   Procedure: GASTROJEJUNOSTOMY;  Surgeon: Leighton Ruff, MD;  Location: [facility];  Service: General;  Laterality: N/A;  . PANCREAS BIOPSY  11/27/2016   Procedure: PANCREACTIC BIOPSY;  Surgeon: Leighton Ruff, MD;  Location: [facility];  Service: General;;  . Bess Kinds CHOLANGIOSCOPY N/A 12/26/2015   Procedure: OEUMPNTI CHOLANGIOSCOPY;  Surgeon: [name], MD;  Location: [facility];  Service: Endoscopy;  Laterality: N/A;  . TONSILLECTOMY     SOCIAL HISTORY: Social History   Socioeconomic History  . Marital status: Married    Spouse name: Not on file  . Number of children: 2  . Years of education: Not on file  . Highest education level: Not on file  Social Needs  . Financial resource strain: Not on file  . Food insecurity - worry: Not on file  . Food insecurity - inability: Not on file  . Transportation needs - medical: Not on file  . Transportation needs - non-medical: Not on file  Occupational History  . Occupation: Retired  Tobacco Use  . Smoking status: Former Smoker    Packs/day: 1.00    Years: 10.00    Pack years: 10.00    Types: Cigarettes    Last attempt to quit: 1957    Years since quitting: 62.0  . Smokeless tobacco: Never Used  Substance and Sexual Activity  . Alcohol use: No    Comment: no recent   . Drug use: No  . Sexual activity: Not on file  Other Topics Concern  . Not on file  Social History Narrative  . Not on file   FAMILY HISTORY: Family History  Problem Relation Age of Onset  . Breast cancer Sister 32       treated with radiation  . Heart disease Father    ALLERGIES:  is allergic to sulfamethoxazole.  MEDICATIONS:  Current Outpatient Medications  Medication Sig Dispense Refill  . amLODipine (NORVASC) 10 MG tablet Take 10 mg by mouth at bedtime.      . Cholecalciferol (VITAMIN D3) 400 units CAPS Take 2 capsules by mouth daily.    . hyoscyamine (LEVSIN, ANASPAZ) 0.125 MG tablet Take 1 tablet (0.125 mg total) by mouth 3 (three) times daily as needed. 90 tablet 1  . levothyroxine (SYNTHROID, LEVOTHROID) 25 MCG tablet Take 25 mcg by mouth daily before breakfast.    . lipase/protease/amylase (CREON) 12000 units CPEP capsule Take 2 capsules (24,000 Units total) by mouth 3 (three) times daily before meals. 540 capsule 1  . lisinopril (PRINIVIL,ZESTRIL) 5 MG tablet Take 5 mg by mouth at bedtime.     . Multiple Vitamins-Minerals (MULTIVITAMIN GUMMIES ADULT) CHEW Chew 1 tablet by mouth daily.    . polyethylene glycol powder (GLYCOLAX/MIRALAX) powder Take 17 g by  mouth daily as needed for mild constipation.     . polyvinyl alcohol (LIQUIFILM TEARS) 1.4 % ophthalmic solution Place 1 drop into both eyes daily as needed for dry eyes.    . traMADol (ULTRAM) 5 mg/mL SUSP Take 5 mLs (25 mg total) every 6 (six) hours by mouth. 100 mL 0  . traMADol (ULTRAM) 50 MG tablet Take 1 tablet (50 mg total) by mouth every 6 (six) hours as needed. 30 tablet 1   No current facility-administered medications for this visit.    REVIEW OF SYSTEMS:   Constitutional: Denies fevers, chills or abnormal night sweats. Stable weight (+) low appetite  Eyes: Denies blurriness of vision, double vision or watery eyes Ears, nose, mouth, throat, and face: Denies mucositis or sore throat Respiratory: Denies dyspnea or wheezes (+) chronic dry cough, secondary to lisinopril Cardiovascular: Denies palpitation, chest discomfort or lower extremity swelling Gastrointestinal:  Denies nausea, heartburn or change in bowel habits (+) constipation (+) diffuse lower abdominal pain, bilateral Skin: Denies abnormal skin rashes Lymphatics: Denies new lymphadenopathy or easy bruising Neurological:Denies numbness, tingling or new weaknesses Behavioral/Psych: Mood is stable, no new changes  All  other systems were reviewed with the patient and are negative.  PHYSICAL EXAMINATION:  ECOG PERFORMANCE STATUS: 1 - Symptomatic but completely ambulatory  Vitals:   08/16/17 1511  BP: 131/60  Pulse: 65  Resp: 14  Temp: 97.8 F (36.6 C)  SpO2: 100%   Filed Weights   08/16/17 1511  Weight: 87 lb 8 oz (39.7 kg)     GENERAL:alert, no distress and comfortable SKIN: skin color, texture, turgor are normal, no rashes or significant lesions EYES: normal, conjunctiva are pink and non-injected, sclera clear OROPHARYNX:no exudate, no erythema and lips, buccal mucosa, and tongue normal  NECK: supple, thyroid normal size, non-tender, without nodularity LYMPH:  no palpable lymphadenopathy in the cervical, axillary or inguinal LUNGS: clear to auscultation and percussion with normal breathing effort HEART: regular rate & rhythm and no murmurs and no lower extremity edema ABDOMEN:abdomen soft and normal bowel sounds. RUQ tenderness to palpation. No palpable organomegaly or masses. Musculoskeletal:no cyanosis of digits and no clubbing  PSYCH: alert & oriented x 3 with fluent speech NEURO: no focal motor/sensory deficits  LABORATORY DATA:  I have reviewed the data as listed CBC Latest Ref Rng & Units 08/16/2017 06/28/2017 05/19/2017  WBC 3.9 - 10.3 10e3/uL 4.2 5.0 5.3  Hemoglobin 11.6 - 15.9 g/dL 12.4 12.2 12.8  Hematocrit 34.8 - 46.6 % 38.1 37.7 37.7  Platelets 145 - 400 10e3/uL 258 302 309   CMP Latest Ref Rng & Units 08/16/2017 06/28/2017 04/28/2017  Glucose 70 - 140 mg/dl 114 103 108(H)  BUN 7.0 - 26.0 mg/dL 8.6 8.7 9  Creatinine 0.6 - 1.1 mg/dL 0.8 0.7 0.66  Sodium 136 - 145 mEq/L 137 135(L) 130(L)  Potassium 3.5 - 5.1 mEq/L 4.0 4.0 4.0  Chloride 96 - 112 mEq/L - - 95(L)  CO2 22 - 29 mEq/L '27 25 26  '$ Calcium 8.4 - 10.4 mg/dL 9.5 9.4 10.5  Total Protein 6.4 - 8.3 g/dL 7.4 7.5 -  Total Bilirubin 0.20 - 1.20 mg/dL 0.41 0.37 -  Alkaline Phos 40 - 150 U/L 91 81 -  AST 5 - 34 U/L 37(H) 27  -  ALT 0 - 55 U/L 40 24 -   PATHOLOGY  Diagnosis 05/19/17 Pancreas, biopsy - ADENOCARCINOMA. - SEE COMMENT. Microscopic Comment Dr. Claudette Laws has reviewed the case and concurs with this interpretation. Dr. Burr Medico  was paged on 05/20/2017. Per request, MMR by [facility] testing will be performed and the results reported separately. ([facility]:ah 05/20/17)   Diagnosis 11/27/2016 Pancreas, biopsy, mass SMALL FOCUS OF ATYPICAL GLANDS, SUSPICIOUS FOR INFILTRATING ADENOCARCINOMA. Microscopic Comment Multiple levels of tissue has been exam, due to limited atypical cells, a definitive diagnosis can not be reached. This case also reviewed by Drs. Saralyn Pilar and Smir and agree.  RADIOGRAPHIC STUDIES: I have personally reviewed the radiological images as listed and agreed with the findings in the report. No results found. ASSESSMENT & PLAN: Miah Boye is a 83 y.o.  female with arthritis, HTN, thyroid disease, history of bilary duct stent, and pancreas mass suspicious for adenocarcinoma.   1. Pancreatic adenocarcinoma in head, cT2N0Mo, stage IB  -I reviewed the patient's medical records extensively with the patient and family.  -She had multiple bile duct brushings, cytologies were negative. Her last fine-needle biopsy of the pancreatic mass during her gastric bypass surgery for gastric outlet obstructions in April 2018 showed small cluster of atypical cells, suspicious for adenocarcinoma, but not definitive. -she finally underwent CT-guided biopsy of the pancreatic mass, which confirmed adenocarcinoma  -Her cancer appears to be indolent, most asymptomatic over the past two years.  -surgery is not a likely option due to the location of her tumor and her advanced age, although there is no clear vascular invasion by the tumor on the CT scan.  -The goal of therapy is palliative, if no surgery. We discussed various options including palliative radiation and chemotherapy -Chemotherapy is a possible option to control  symptoms and improve quality of life, such as single agent Gemcitabine  -I discussed that her MMR came back normal which means she is not eligable for immunotherapy as it will not help control her disease.  -She is clinically doing very well, asymptomatic except occasional abdominal pain which could be related to her constipation.  Patient expressed her wishes to preserve her quality of life, but is willing to consider treatment if she becomes symptomatic from her cancer. -Lengthy discussion, we decided to continue supportive care, will hold treatment until she develops symptoms  -I previously discussed we will do scans if she develops worsening or new symptoms. She knows to contact us if she experiences these symptomes.  -I previously  encouraged her to eat more to take more ensure boost to supplement her diet.  -f/u in 3 months, she knows to call me if she develops symptoms before next visit   2. Abdominal pain -not typical from the pancreatic tumor given the intermittent nature, her pain is located in the low abdomen, possible related to her bowel spasm or constipation  -We previously discussed options of pain medicines, patient declined due to her concern of constipation -She has taken chewing ibuprofen  -Dr. [name] prescribed her hyoscyamine  -I previously suggested starting with liquid tylenol 320-'600mg'$  for adult dose. It not enough I will order her Tylenol #3.  -I previously discussed pain medication can cause constipation so she would watch her BMs.  - I also previously suggested Pancreatic enzyme Supplement, Creon, 2400U before each meal to help her GI system. -I give her prescription of tramadol, she knows to crush the medicine if needed.  3. Chronic constipation -We previously discussed constipation management, I encouraged her to take a stool softener, and increase MiraLAX dose -We previously discussed other laxatives options, such as magnesium citrate for severe constipation -She  will try MiraLAX.   4. Hypertension, hypothyroidism -Follow up with primary care physician  Plan: -I give her prescription of tramadol 50 mg pills today, she will use as needed for abdominal pain -Try MiraLAX every 1-2 days for her constipation -Lab and follow-up in 3 months.  She knows to call me if she develo  new symptoms before her next visit.   No orders of the defined types were placed in this encounter.   All questions were answered. The patient knows to call the clinic with any problems, questions or concerns.  I spent 20 minutes counseling the patient face to face. The total time spent in the appointment was 25 minutes and more than 50% was on counseling.   Truitt Merle, MD 08/16/2017 11:28 PM

## 2017-08-16 ENCOUNTER — Encounter: Payer: Self-pay | Admitting: Hematology

## 2017-08-16 ENCOUNTER — Ambulatory Visit (HOSPITAL_BASED_OUTPATIENT_CLINIC_OR_DEPARTMENT_OTHER): Payer: Medicare Other | Admitting: Hematology

## 2017-08-16 ENCOUNTER — Other Ambulatory Visit (HOSPITAL_BASED_OUTPATIENT_CLINIC_OR_DEPARTMENT_OTHER): Payer: Medicare Other

## 2017-08-16 ENCOUNTER — Telehealth: Payer: Self-pay | Admitting: Hematology

## 2017-08-16 VITALS — BP 131/60 | HR 65 | Temp 97.8°F | Resp 14 | Ht 59.0 in | Wt 87.5 lb

## 2017-08-16 DIAGNOSIS — C259 Malignant neoplasm of pancreas, unspecified: Secondary | ICD-10-CM

## 2017-08-16 DIAGNOSIS — E039 Hypothyroidism, unspecified: Secondary | ICD-10-CM | POA: Diagnosis not present

## 2017-08-16 DIAGNOSIS — C250 Malignant neoplasm of head of pancreas: Secondary | ICD-10-CM | POA: Diagnosis not present

## 2017-08-16 DIAGNOSIS — R109 Unspecified abdominal pain: Secondary | ICD-10-CM | POA: Diagnosis not present

## 2017-08-16 DIAGNOSIS — K5909 Other constipation: Secondary | ICD-10-CM

## 2017-08-16 DIAGNOSIS — I10 Essential (primary) hypertension: Secondary | ICD-10-CM

## 2017-08-16 LAB — COMPREHENSIVE METABOLIC PANEL
ALT: 40 U/L (ref 0–55)
ANION GAP: 9 meq/L (ref 3–11)
AST: 37 U/L — ABNORMAL HIGH (ref 5–34)
Albumin: 4.2 g/dL (ref 3.5–5.0)
Alkaline Phosphatase: 91 U/L (ref 40–150)
BILIRUBIN TOTAL: 0.41 mg/dL (ref 0.20–1.20)
BUN: 8.6 mg/dL (ref 7.0–26.0)
CO2: 27 meq/L (ref 22–29)
CREATININE: 0.8 mg/dL (ref 0.6–1.1)
Calcium: 9.5 mg/dL (ref 8.4–10.4)
Chloride: 102 mEq/L (ref 98–109)
EGFR: >60 mL/min/{1.73_m2} (ref >60)
Glucose: 114 mg/dl (ref 70–140)
Potassium: 4 mEq/L (ref 3.5–5.1)
Sodium: 137 mEq/L (ref 136–145)
TOTAL PROTEIN: 7.4 g/dL (ref 6.4–8.3)

## 2017-08-16 LAB — CBC WITH DIFFERENTIAL/PLATELET
BASO%: 0.8 % (ref 0.0–2.0)
BASOS ABS: 0 10*3/uL (ref 0.0–0.1)
EOS%: 2.7 % (ref 0.0–7.0)
Eosinophils Absolute: 0.1 10*3/uL (ref 0.0–0.5)
HCT: 38.1 % (ref 34.8–46.6)
HGB: 12.4 g/dL (ref 11.6–15.9)
LYMPH%: 21.8 % (ref 14.0–49.7)
MCH: 30.4 pg (ref 25.1–34.0)
MCHC: 32.5 g/dL (ref 31.5–36.0)
MCV: 93.3 fL (ref 79.5–101.0)
MONO#: 0.5 10*3/uL (ref 0.1–0.9)
MONO%: 11.9 % (ref 0.0–14.0)
NEUT#: 2.7 10*3/uL (ref 1.5–6.5)
NEUT%: 62.8 % (ref 38.4–76.8)
Platelets: 258 10*3/uL (ref 145–400)
RBC: 4.08 10*6/uL (ref 3.70–5.45)
RDW: 13.8 % (ref 11.2–14.5)
WBC: 4.2 10*3/uL (ref 3.9–10.3)
lymph#: 0.9 10*3/uL (ref 0.9–3.3)

## 2017-08-16 MED ORDER — TRAMADOL HCL 50 MG PO TABS: 50.0000 mg | ORAL_TABLET | Freq: Four times a day (QID) | ORAL | 1 refills | Status: DC | PRN

## 2017-08-16 NOTE — Telephone Encounter (Signed)
Gave patient avs and calendar per 12/31 los.

## 2017-08-17 LAB — CANCER ANTIGEN 19-9: CAN 19-9: 717 U/mL — AB (ref 0–35)

## 2017-10-15 ENCOUNTER — Telehealth: Payer: Self-pay

## 2017-10-15 DIAGNOSIS — C259 Malignant neoplasm of pancreas, unspecified: Secondary | ICD-10-CM

## 2017-10-15 NOTE — Progress Notes (Signed)
[place]  [phone]) [phone] Fax:(336) [phone]  Clinic Follow Up Note   Patient Care Team: Crist Infante, MD as PCP - General (Internal Medicine) [name], MD as Attending Physician (Gastroenterology) Truitt Merle, MD as Consulting Physician (Hematology) 10/19/2017  CHIEF COMPLAINTS:  Abdominal pain   Oncology History   Cancer Staging Pancreatic adenocarcinoma [facility]) Staging form: Exocrine Pancreas, AJCC 8th Edition - Clinical stage from 05/19/2017: Stage IB (cT2, cN0, cM0) - Signed by Truitt Merle, MD on 05/25/2017       Pancreatic adenocarcinoma ([place])   08/06/2015 Imaging    CT ABD/PELVIS IMPRESSION: 1. Intra and extrahepatic biliary dilatation with an abrupt cut off of the common bile duct near its entry into the pancreatic head. Findings most suspicious for a biliary tumor (cholangiocarcinoma) but a common bile duct stone and pancreatic lesion are also possible. MRCP may be helpful for further evaluation if the patient can hold her breath. Otherwise, ERCP is suggested for both diagnostic and therapeutic purposes. 2. Moderate distention of the gallbladder without obvious gallstones. 3. No liver lesions or abdominal lymphadenopathy.       08/08/2015 Procedure    EUS and ERCP per Dr. [name]: indicated for painless juandice, dilated biliary tree  Very vaguely abnormal region in the pancreatic head. This was hypoechoic and SOMEWHAT masslike, measured 2cm Maximally.  2. CBD was dilated up to 4m, tapered abruptly in the head of pancreas. 3. Main pancreatic duct was non-dilated 4. No peripancreatic adenopathy.      08/08/2015 Pathology Results    EUS path: Common Bile Duct , bx SMALL FRAGMENT OF BILE DUCT EPITHELIUM, PERIDUCTAL GLANDS AND FIBROMUSCULAR TISSUE NEGATIVE FOR MALIGNANCY      08/08/2015 Procedure    IR biliary stent placement       09/05/2015 Pathology Results    1. Soft tissue, biopsy, common bile duct - SCANT SOFT  TISSUE AND NECROSIS WITH ACUTE INFLAMMATION. - SCANT BENIGN GLANDULAR EPITHLEIUM. - NO MALIGNANCY IDENTIFIED. 2. Soft tissue, biopsy, common bile duct - SOFT TISSUE AND NECROSIS WITH ACUTE INFLAMMATION. - BENIGN PANCREATIC TISSUE. - NO MALIGNANCY IDENTIFIED.      09/05/2015 Imaging    CT ABD/PELVIS IMPRESSION: 1. No acute abnormality seen to explain the patient's symptoms. 2. Scattered pneumobilia is slightly more prominent, status post placement of a common bile duct stent. External biliary drainage catheter noted in expected position, adjacent to the upper aspect of the common bile duct stent. 3. Vague soft tissue edema about the pancreatic head and second segment of duodenum is relatively stable from the prior CT, and likely reflects the recent procedure. 4. Minimal diverticulosis at the cecum, without evidence of diverticulitis. 5. Mild degenerative change along the lumbar spine.       12/26/2015 Procedure    ERCP for stent replacement to bile duct stricture       12/26/2015 Pathology Results    BILE DUCT BRUSHING WITH STENT (SPECIMEN 1 OF 1, COLLECTED ON 12/26/15): NO MALIGNANT CELLS IDENTIFIED.      11/27/2016 Initial Biopsy    Pancreas, biopsy, mass Small focus of atypical glands, suspicious for infiltrating adenocarcinoma      11/27/2016 Procedure    POST-OPERATIVE DIAGNOSIS:  Gastric outlet obstruction  PROCEDURE:   GASTROJEJUNOSTOMY, BIOPSY PANCREATIC HEAD MASS       04/23/2017 Imaging    CT AP 04/23/17 IMPRESSION: 3.5 cm pancreatic head mass surrounding the common bile duct stent, consistent with pancreatic adenocarcinoma.  No evidence of metastatic disease within the abdomen or  pelvis.      05/03/2017 Initial Diagnosis    Pancreatic adenocarcinoma ([place])      05/13/2017 Imaging    CT Chest WO Contrast 05/13/17 IMPRESSION: No definite evidence of intrathoracic metastatic disease. Old granulomatous disease. Aortic Atherosclerosis        05/19/2017  Pathology Results    Diagnosis 05/19/17 Pancreas, biopsy - ADENOCARCINOMA.  MMR was normal       HISTORY OF PRESENTING ILLNESS:   Summer Jones 84 y.o. female is here because of suspicion for pancreatic adenocarcinoma. She has been under the care of Dr. [name] since 2016. She was referred by Dr. [name], she presents to the clinic with her husband and 2 sons.  In 2016, constipation and scleral juandice prompted the patient to seek care with her PCP. She was found to have intrahepatic and extrahepatic biliary dilation on CT abd/pelvis on 08/06/2015. A percutaneous drainage tube was placed then later exchanged for biliary stent. Jaundice resolved at that time. The stent was replaced in 12/2015. She was hospitalized in April 2018 for constipation. She had one episode of nausea and vomiting on the way to the ER at that time. Surgical biopsy by Dr. [name] on 11/27/16 showed atypical cells concerning for pancreatic adenocarcinoma but EUS brushings by Dr. [name] showed no malignancy. In 04/2017 she developed bilateral lower abdominal pain and soreness, rating this 8/10, and constipation. She had minimal blood on tissue with wiping and has known hemorrhoids. CT abdomen and pelvis revealed 3.5 pancreatic head mass surrounding the common bile duct stent, consistent with pancreatic adenocarcinoma. For her pain, she takes ibuprofen but does not like to take medication due to chronic constipation. Associated with bloating, distention, and constipation. She uses miralax and prune juice. Last BM 05/04/17 which was small, soft and difficult. No nausea, vomiting, weight loss, or decreased appetite. However, she limits food due to fear of subsequent abdominal pain. She has minimal fatigue. Chronic dry cough from lisinopril.     CURRENT THERAPY: Supportive care    INTERVAL HISTORY:  Summer Jones is here for a follow up. She presents to the clinic today accompanied by her husband. She called my office last week, and  requested to be seen earlier than her scheduled appointment, due to her episodes of abdominal pain.  She has had intermittent mid abdominal pain in the past few months, usually resolve on its own.  She denies associated constipation, nausea, or vomiting.  She does have mild constipation, for which she takes a teaspoon of MiraLAX daily.  She had recurrent mid abdominal pain again last week, which lasted most of the day for several days.  But she has not had this type of pain in the past 2 days.  She has occasional epigastric shooting pain, which lasts very shot period of time.  She otherwise denies any significant nausea, vomiting, or other new symptoms.  Her appetite is slightly low, but stable overall, her weight has been stable.  MEDICAL HISTORY:  Past Medical History:  Diagnosis Date  . Arthritis   . Common bile duct (CBD) obstruction   . Difficult intubation Anterior glottis, short TMD, limited mouth opening. Required glidescope and bougie stylet with multiple attempts.  . Diverticular disease   . Diverticulosis   . Essential hypertension   . Hypertension   . Hypothyroidism   . Jaundice   . Mitral valve prolapse   . Thyroid disease    SURGICAL HISTORY: Past Surgical History:  Procedure Laterality Date  . BREAST BIOPSY  Right   . BREAST CYST EXCISION Right   . ENDOSCOPIC RETROGRADE CHOLANGIOPANCREATOGRAPHY (ERCP) WITH PROPOFOL N/A 08/08/2015   Procedure: ENDOSCOPIC RETROGRADE CHOLANGIOPANCREATOGRAPHY (ERCP) WITH PROPOFOL;  Surgeon: [name], MD;  Location: [facility];  Service: Endoscopy;  Laterality: N/A;  . 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 ESOPHAGOGASTRODUODENOSCOPY (EGD) WITH PROPOFOL N/A 11/25/2016   Procedure:  ESOPHAGOGASTRODUODENOSCOPY (EGD) WITH PROPOFOL;  Surgeon: [name], MD;  Location: [facility];  Service: Endoscopy;  Laterality: N/A;  . EUS N/A 08/08/2015   Procedure: UPPER ENDOSCOPIC ULTRASOUND (EUS) RADIAL;  Surgeon: [name], MD;  Location: [facility];  Service: Endoscopy;  Laterality: N/A;  . INGUINAL HERNIA REPAIR     right  . LAPAROTOMY N/A 11/27/2016   Procedure: GASTROJEJUNOSTOMY;  Surgeon: Leighton Ruff, MD;  Location: [facility];  Service: General;  Laterality: N/A;  . PANCREAS BIOPSY  11/27/2016   Procedure: PANCREACTIC BIOPSY;  Surgeon: Leighton Ruff, MD;  Location: [facility];  Service: General;;  . Bess Kinds CHOLANGIOSCOPY N/A 12/26/2015   Procedure: PVXYIAXK CHOLANGIOSCOPY;  Surgeon: [name], MD;  Location: [facility];  Service: Endoscopy;  Laterality: N/A;  . TONSILLECTOMY     SOCIAL HISTORY: Social History   Socioeconomic History  . Marital status: Married    Spouse name: Not on file  . Number of children: 2  . Years of education: Not on file  . Highest education level: Not on file  Social Needs  . Financial resource strain: Not on file  . Food insecurity - worry: Not on file  . Food insecurity - inability: Not on file  . Transportation needs - medical: Not on file  . Transportation needs - non-medical: Not on file  Occupational History  . Occupation: Retired  Tobacco Use  . Smoking status: Former Smoker    Packs/day: 1.00    Years: 10.00    Pack years: 10.00    Types: Cigarettes    Last attempt to quit: 1957    Years since quitting: 62.2  . Smokeless tobacco: Never Used  Substance and Sexual Activity  . Alcohol use: No    Comment: no recent   . Drug use: No  . Sexual activity: Not on file  Other Topics Concern  . Not on file  Social History Narrative  . Not on file   FAMILY HISTORY: Family History  Problem Relation Age of Onset  . Breast cancer Sister 17       treated with radiation  . Heart disease Father    ALLERGIES:  is  allergic to sulfamethoxazole.  MEDICATIONS:  Current Outpatient Medications  Medication Sig Dispense Refill  . amLODipine (NORVASC) 10 MG tablet Take 10 mg by mouth at bedtime.     . Cholecalciferol (VITAMIN D3) 400 units CAPS Take 2 capsules by mouth daily.    . hyoscyamine (LEVSIN, ANASPAZ) 0.125 MG tablet Take 1 tablet (0.125 mg total) by mouth 3 (three) times daily as needed. 90 tablet 1  . levothyroxine (SYNTHROID, LEVOTHROID) 25 MCG tablet Take 25 mcg by mouth daily before breakfast.    . lipase/protease/amylase (CREON) 12000 units CPEP capsule Take 2 capsules (24,000 Units total) by mouth 3 (three) times daily before meals. (Patient taking differently: Take 24,000 Units by mouth daily. ) 540 capsule 1  . lisinopril (  PRINIVIL,ZESTRIL) 5 MG tablet Take 5 mg by mouth at bedtime.     . Multiple Vitamins-Minerals (MULTIVITAMIN GUMMIES ADULT) CHEW Chew 1 tablet by mouth daily.    . polyethylene glycol powder (GLYCOLAX/MIRALAX) powder Take 17 g by mouth daily as needed for mild constipation.     . polyvinyl alcohol (LIQUIFILM TEARS) 1.4 % ophthalmic solution Place 1 drop into both eyes daily as needed for dry eyes.    . traMADol (ULTRAM) 50 MG tablet Take 1 tablet (50 mg total) by mouth every 6 (six) hours as needed. (Patient not taking: Reported on 10/19/2017) 30 tablet 1   No current facility-administered medications for this visit.    REVIEW OF SYSTEMS:   Constitutional: Denies fevers, chills or abnormal night sweats. Stable weight (+) low appetite  Eyes: Denies blurriness of vision, double vision or watery eyes Ears, nose, mouth, throat, and face: Denies mucositis or sore throat Respiratory: Denies dyspnea or wheezes (+) chronic dry cough, secondary to lisinopril Cardiovascular: Denies palpitation, chest discomfort or lower extremity swelling Gastrointestinal:  Denies nausea, heartburn or change in bowel habits (+) constipation (+) diffuse lower abdominal pain, bilateral Skin: Denies  abnormal skin rashes Lymphatics: Denies new lymphadenopathy or easy bruising Neurological:Denies numbness, tingling or new weaknesses Behavioral/Psych: Mood is stable, no new changes  All other systems were reviewed with the patient and are negative.  PHYSICAL EXAMINATION:  ECOG PERFORMANCE STATUS: 1 - Symptomatic but completely ambulatory  Vitals:   10/19/17 1414  BP: (!) 145/69  Pulse: 69  Resp: 18  Temp: (!) 97.5 F (36.4 C)  SpO2: 100%   Filed Weights   10/19/17 1414  Weight: 85 lb 3.2 oz (38.6 kg)     GENERAL:alert, no distress and comfortable SKIN: skin color, texture, turgor are normal, no rashes or significant lesions EYES: normal, conjunctiva are pink and non-injected, sclera clear OROPHARYNX:no exudate, no erythema and lips, buccal mucosa, and tongue normal  NECK: supple, thyroid normal size, non-tender, without nodularity LYMPH:  no palpable lymphadenopathy in the cervical, axillary or inguinal LUNGS: clear to auscultation and percussion with normal breathing effort HEART: regular rate & rhythm and no murmurs and no lower extremity edema ABDOMEN:abdomen soft and normal bowel sounds.  There is a 2-3 cm mass in the right side of her umbilical, nontender.  No organomegaly.  She has moderate tenderness in the epigastric area, no palpable mass in the area. Musculoskeletal:no cyanosis of digits and no clubbing  PSYCH: alert & oriented x 3 with fluent speech NEURO: no focal motor/sensory deficits  LABORATORY DATA:  I have reviewed the data as listed CBC Latest Ref Rng & Units 10/19/2017 08/16/2017 06/28/2017  WBC 3.9 - 10.3 K/uL 4.7 4.2 5.0  Hemoglobin 11.6 - 15.9 g/dL - 12.4 12.2  Hematocrit 34.8 - 46.6 % 36.9 38.1 37.7  Platelets 145 - 400 K/uL 284 258 302   CMP Latest Ref Rng & Units 10/19/2017 08/16/2017 06/28/2017  Glucose 70 - 140 mg/dL 109 114 103  BUN 7 - 26 mg/dL 10 8.6 8.7  Creatinine 0.60 - 1.10 mg/dL 0.71 0.8 0.7  Sodium 136 - 145 mmol/L 134(L) 137 135(L)    Potassium 3.5 - 5.1 mmol/L 4.8 4.0 4.0  Chloride 98 - 109 mmol/L 99 - -  CO2 22 - 29 mmol/L _0 Calcium 8.4 - 10.4 mg/dL 9.9 9.5 9.4  Total Protein 6.4 - 8.3 g/dL 7.3 7.4 7.5  Total Bilirubin 0.2 - 1.2 mg/dL 0.4 0.41 0.37  Alkaline Phos 40 -  150 U/L 88 91 81  AST 5 - 34 U/L 25 37(H) 27  ALT 0 - 55 U/L 20 40 24   PATHOLOGY  Diagnosis 05/19/17 Pancreas, biopsy - ADENOCARCINOMA. - SEE COMMENT. Microscopic Comment Dr. Claudette Laws has reviewed the case and concurs with this interpretation. Dr. Burr Medico was paged on 05/20/2017. Per request, MMR by [facility] testing will be performed and the results reported separately. (JBK:ah 05/20/17)   Diagnosis 11/27/2016 Pancreas, biopsy, mass SMALL FOCUS OF ATYPICAL GLANDS, SUSPICIOUS FOR INFILTRATING ADENOCARCINOMA. Microscopic Comment Multiple levels of tissue has been exam, due to limited atypical cells, a definitive diagnosis can not be reached. This case also reviewed by Drs. Saralyn Pilar and Smir and agree.  RADIOGRAPHIC STUDIES: I have personally reviewed the radiological images as listed and agreed with the findings in the report. No results found. ASSESSMENT & PLAN: Summer Jones is a 84 y.o.  female with arthritis, HTN, thyroid disease, history of bilary duct stent, and pancreas mass suspicious for adenocarcinoma.   1. Pancreatic adenocarcinoma in head, cT2N0Mo, stage IB, MMR normal, untreated  -I reviewed the patient's medical records extensively with the patient and family.  -She had multiple bile duct brushings, cytologies were negative. Her last fine-needle biopsy of the pancreatic mass during her gastric bypass surgery for gastric outlet obstructions in April 2018 showed small cluster of atypical cells, suspicious for adenocarcinoma, but not definitive. -she finally underwent CT-guided biopsy of the pancreatic mass, which confirmed adenocarcinoma  -Her cancer appears to be indolent, most asymptomatic over the past two years.  -surgery is not  a likely option due to the location of her tumor and her advanced age, although there is no clear vascular invasion by the tumor on the CT scan.  -The goal of therapy is palliative, if no surgery. We discussed various options including palliative radiation and chemotherapy -Chemotherapy is a possible option to control symptoms and improve quality of life, such as single agent Gemcitabine  -I discussed that her MMR came back normal which means she is not eligable for immunotherapy  -Due to her advanced age, relatively asymptomatic, she has decided not to pursue any anticancer treatment or surgery. -She has developed worsening abdominal pain lately, exam showed a 2-3 cm palpable mass in the right umbilical area.  Made her to get a repeat a CT abdomen and pelvis with contrast to rule out metastasis. -I will call her after her CT scan.  If it is negative for metastasis, I will continue observation and see her back in 2 months. -She does have tramadol at home, I suggested her to use if needed.   2. Abdominal pain -not typical from the pancreatic tumor given the intermittent nature, her pain is located in the mid-low abdomen, possible related to her bowel spasm or constipation  -We previously discussed options of pain medicines, patient declined due to her concern of constipation -She has taken chewing ibuprofen  -Dr. [name] prescribed her hyoscyamine  -I previously suggested starting with liquid tylenol 320-61m for adult dose. It not enough I will order her Tylenol #3.  -I previously discussed pain medication can cause constipation so she would watch her BMs.  - I also previously suggested Pancreatic enzyme Supplement, Creon, 2400U before each meal to help her GI system. -I gave her prescription of tramadol on 08/16/17, she knows to crush the medicine if needed. -Due to the recent worsening abdominal pain, and a palpable small mass in the umbilical area, I recommend her to get a CT scan.  3.  Chronic constipation -We previously discussed constipation management, I encouraged her to take a stool softener, and increase MiraLAX dose -We previously discussed other laxatives options, such as magnesium citrate for severe constipation -She continue low-dose daily MiraLAX.   4. Hypertension, hypothyroidism -Follow up with primary care physician  Plan: -CT abdomen pelvis with contrast in the next 1-2 weeks -if CT scans negative for metastasis or other concerning finding, I will give her a call and review the result over the phone.,  I will see her back in 2 months for follow-up -will see her back sooner if needed    Orders Placed This Encounter  Procedures  . CT Abdomen Pelvis W Contrast    Standing Status:   Future    Standing Expiration Date:   10/19/2018    Order Specific Question:   If indicated for the ordered procedure, I authorize the administration of contrast media per Radiology protocol    Answer:   Yes    Order Specific Question:   Preferred imaging location?    Answer:   [facility]    Order Specific Question:   Radiology Contrast Protocol - do NOT remove file path    Answer:   [email or web address]    All questions were answered. The patient knows to call the clinic with any problems, questions or concerns.  I spent 20 minutes counseling the patient face to face. The total time spent in the appointment was 25 minutes and more than 50% was on counseling.   Truitt Merle, MD 10/19/2017

## 2017-10-15 NOTE — Telephone Encounter (Signed)
Pt called with increased abdominal pain and would like to see Dr. Burr Medico sooner. She is scheduled for next Tuesday, March 5th. Patient aware of date/time.  Cyndia Bent RN

## 2017-10-19 ENCOUNTER — Inpatient Hospital Stay: Payer: Medicare Other | Attending: Hematology | Admitting: Hematology

## 2017-10-19 ENCOUNTER — Encounter: Payer: Self-pay | Admitting: Hematology

## 2017-10-19 ENCOUNTER — Telehealth: Payer: Self-pay | Admitting: Hematology

## 2017-10-19 ENCOUNTER — Inpatient Hospital Stay: Payer: Medicare Other

## 2017-10-19 VITALS — BP 145/69 | HR 69 | Temp 97.5°F | Resp 18 | Ht 59.0 in | Wt 85.2 lb

## 2017-10-19 DIAGNOSIS — I340 Nonrheumatic mitral (valve) insufficiency: Secondary | ICD-10-CM | POA: Diagnosis not present

## 2017-10-19 DIAGNOSIS — Z8719 Personal history of other diseases of the digestive system: Secondary | ICD-10-CM | POA: Diagnosis not present

## 2017-10-19 DIAGNOSIS — C250 Malignant neoplasm of head of pancreas: Secondary | ICD-10-CM | POA: Insufficient documentation

## 2017-10-19 DIAGNOSIS — K581 Irritable bowel syndrome with constipation: Secondary | ICD-10-CM | POA: Insufficient documentation

## 2017-10-19 DIAGNOSIS — C259 Malignant neoplasm of pancreas, unspecified: Secondary | ICD-10-CM

## 2017-10-19 DIAGNOSIS — Z87891 Personal history of nicotine dependence: Secondary | ICD-10-CM | POA: Insufficient documentation

## 2017-10-19 DIAGNOSIS — Z79899 Other long term (current) drug therapy: Secondary | ICD-10-CM | POA: Insufficient documentation

## 2017-10-19 DIAGNOSIS — M129 Arthropathy, unspecified: Secondary | ICD-10-CM | POA: Insufficient documentation

## 2017-10-19 DIAGNOSIS — E039 Hypothyroidism, unspecified: Secondary | ICD-10-CM | POA: Insufficient documentation

## 2017-10-19 DIAGNOSIS — I341 Nonrheumatic mitral (valve) prolapse: Secondary | ICD-10-CM | POA: Insufficient documentation

## 2017-10-19 DIAGNOSIS — I700 Atherosclerosis of aorta: Secondary | ICD-10-CM | POA: Insufficient documentation

## 2017-10-19 DIAGNOSIS — K5790 Diverticulosis of intestine, part unspecified, without perforation or abscess without bleeding: Secondary | ICD-10-CM | POA: Insufficient documentation

## 2017-10-19 DIAGNOSIS — I10 Essential (primary) hypertension: Secondary | ICD-10-CM | POA: Insufficient documentation

## 2017-10-19 DIAGNOSIS — Z803 Family history of malignant neoplasm of breast: Secondary | ICD-10-CM

## 2017-10-19 DIAGNOSIS — R05 Cough: Secondary | ICD-10-CM | POA: Insufficient documentation

## 2017-10-19 DIAGNOSIS — R109 Unspecified abdominal pain: Secondary | ICD-10-CM | POA: Insufficient documentation

## 2017-10-19 LAB — CBC WITH DIFFERENTIAL (CANCER CENTER ONLY)
BASOS PCT: 1 %
Basophils Absolute: 0 10*3/uL (ref 0.0–0.1)
Eosinophils Absolute: 0.1 10*3/uL (ref 0.0–0.5)
Eosinophils Relative: 2 %
HEMATOCRIT: 36.9 % (ref 34.8–46.6)
Hemoglobin: 12.1 g/dL (ref 11.6–15.9)
Lymphocytes Relative: 21 %
Lymphs Abs: 1 10*3/uL (ref 0.9–3.3)
MCH: 30.3 pg (ref 25.1–34.0)
MCHC: 32.7 g/dL (ref 31.5–36.0)
MCV: 92.7 fL (ref 79.5–101.0)
MONO ABS: 0.6 10*3/uL (ref 0.1–0.9)
MONOS PCT: 13 %
Neutro Abs: 2.9 10*3/uL (ref 1.5–6.5)
Neutrophils Relative %: 63 %
Platelet Count: 284 10*3/uL (ref 145–400)
RBC: 3.99 MIL/uL (ref 3.70–5.45)
RDW: 13.7 % (ref 11.2–14.5)
WBC Count: 4.7 10*3/uL (ref 3.9–10.3)

## 2017-10-19 LAB — CMP (CANCER CENTER ONLY)
ALK PHOS: 88 U/L (ref 40–150)
ALT: 20 U/L (ref 0–55)
AST: 25 U/L (ref 5–34)
Albumin: 4.3 g/dL (ref 3.5–5.0)
Anion gap: 10 (ref 3–11)
BUN: 10 mg/dL (ref 7–26)
CALCIUM: 9.9 mg/dL (ref 8.4–10.4)
CO2: 25 mmol/L (ref 22–29)
Chloride: 99 mmol/L (ref 98–109)
Creatinine: 0.71 mg/dL (ref 0.60–1.10)
GFR, Est AFR Am: >60 mL/min (ref >60)
GFR, Estimated: >60 mL/min (ref >60)
Glucose, Bld: 109 mg/dL (ref 70–140)
Potassium: 4.8 mmol/L (ref 3.5–5.1)
SODIUM: 134 mmol/L — AB (ref 136–145)
Total Bilirubin: 0.4 mg/dL (ref 0.2–1.2)
Total Protein: 7.3 g/dL (ref 6.4–8.3)

## 2017-10-19 NOTE — Telephone Encounter (Signed)
Scheduled appt per 3/5 los - Gave patient AVS and calender per los.  

## 2017-10-20 LAB — CANCER ANTIGEN 19-9: CAN 19-9: 990 U/mL — AB (ref 0–35)

## 2017-11-02 ENCOUNTER — Encounter (HOSPITAL_COMMUNITY): Payer: Self-pay | Admitting: Radiology

## 2017-11-02 ENCOUNTER — Ambulatory Visit (HOSPITAL_COMMUNITY)
Admission: RE | Admit: 2017-11-02 | Discharge: 2017-11-02 | Disposition: A | Discharge status: Home / Self Care | Payer: Medicare Other | Source: Ambulatory Visit | Attending: Hematology | Admitting: Hematology

## 2017-11-02 DIAGNOSIS — C259 Malignant neoplasm of pancreas, unspecified: Secondary | ICD-10-CM | POA: Insufficient documentation

## 2017-11-02 MED ORDER — IOPAMIDOL (ISOVUE-300) INJECTION 61%
INTRAVENOUS | Status: AC
  Administered 2017-11-02: 80 mL
  Filled 2017-11-02: qty 100

## 2017-11-04 ENCOUNTER — Telehealth: Payer: Self-pay | Admitting: Hematology

## 2017-11-04 NOTE — Telephone Encounter (Signed)
I called pt and discussed her CT scan findings.  The primary pancreatic mass has increased in size, but no evidence of metastasis.  I think part of her abdominal pain is related to her constipation, CT scan did show moderate to large volume of stool in the colon.  I encouraged her to continue daily low-dose MiraLAX, and the use for dose of MiraLAX or magnesium citrate once a week to clean her bowel.  She voiced good understanding, and appreciated the call.  Summer Jones  11/04/2017

## 2017-11-15 ENCOUNTER — Ambulatory Visit: Payer: Medicare Other | Admitting: Hematology

## 2017-11-15 ENCOUNTER — Other Ambulatory Visit: Payer: Medicare Other

## 2017-12-14 ENCOUNTER — Emergency Department (HOSPITAL_COMMUNITY): Payer: Medicare Other

## 2017-12-14 ENCOUNTER — Encounter (HOSPITAL_COMMUNITY): Payer: Self-pay | Admitting: Internal Medicine

## 2017-12-14 ENCOUNTER — Emergency Department (HOSPITAL_COMMUNITY)
Admission: EM | Admit: 2017-12-14 | Discharge: 2017-12-14 | Disposition: A | Discharge status: Home / Self Care | Payer: Medicare Other | Attending: Emergency Medicine | Admitting: Emergency Medicine

## 2017-12-14 DIAGNOSIS — Z87891 Personal history of nicotine dependence: Secondary | ICD-10-CM | POA: Diagnosis not present

## 2017-12-14 DIAGNOSIS — R109 Unspecified abdominal pain: Secondary | ICD-10-CM | POA: Diagnosis present

## 2017-12-14 DIAGNOSIS — C250 Malignant neoplasm of head of pancreas: Secondary | ICD-10-CM | POA: Diagnosis not present

## 2017-12-14 DIAGNOSIS — E039 Hypothyroidism, unspecified: Secondary | ICD-10-CM | POA: Insufficient documentation

## 2017-12-14 DIAGNOSIS — Z79899 Other long term (current) drug therapy: Secondary | ICD-10-CM | POA: Insufficient documentation

## 2017-12-14 DIAGNOSIS — R1013 Epigastric pain: Secondary | ICD-10-CM | POA: Insufficient documentation

## 2017-12-14 DIAGNOSIS — I10 Essential (primary) hypertension: Secondary | ICD-10-CM | POA: Insufficient documentation

## 2017-12-14 LAB — CBC
HCT: 36.7 % (ref 36.0–46.0)
Hemoglobin: 12.2 g/dL (ref 12.0–15.0)
MCH: 30.7 pg (ref 26.0–34.0)
MCHC: 33.2 g/dL (ref 30.0–36.0)
MCV: 92.4 fL (ref 78.0–100.0)
Platelets: 265 10*3/uL (ref 150–400)
RBC: 3.97 MIL/uL (ref 3.87–5.11)
RDW: 14.1 % (ref 11.5–15.5)
WBC: 4.8 10*3/uL (ref 4.0–10.5)

## 2017-12-14 LAB — COMPREHENSIVE METABOLIC PANEL
ALBUMIN: 4.3 g/dL (ref 3.5–5.0)
ALT: 20 U/L (ref 14–54)
ANION GAP: 10 (ref 5–15)
AST: 24 U/L (ref 15–41)
Alkaline Phosphatase: 86 U/L (ref 38–126)
BUN: 10 mg/dL (ref 6–20)
CALCIUM: 9.5 mg/dL (ref 8.9–10.3)
CO2: 23 mmol/L (ref 22–32)
Chloride: 106 mmol/L (ref 101–111)
Creatinine, Ser: 0.55 mg/dL (ref 0.44–1.00)
GFR calc non Af Amer: >60 mL/min (ref >60)
GLUCOSE: 127 mg/dL — AB (ref 65–99)
POTASSIUM: 3.8 mmol/L (ref 3.5–5.1)
SODIUM: 139 mmol/L (ref 135–145)
Total Bilirubin: 0.4 mg/dL (ref 0.3–1.2)
Total Protein: 7.3 g/dL (ref 6.5–8.1)

## 2017-12-14 LAB — LIPASE, BLOOD: Lipase: 36 U/L (ref 11–51)

## 2017-12-14 MED ORDER — HYDROMORPHONE HCL 1 MG/ML IJ SOLN
0.5000 mg | Freq: Once | INTRAMUSCULAR | Status: DC
  Filled 2017-12-14: qty 1

## 2017-12-14 MED ORDER — SODIUM CHLORIDE 0.9 % IV BOLUS
500.0000 mL | Freq: Once | INTRAVENOUS | Status: AC
  Administered 2017-12-14: 500 mL via INTRAVENOUS

## 2017-12-14 MED ORDER — IOPAMIDOL (ISOVUE-300) INJECTION 61%
INTRAVENOUS | Status: AC
  Filled 2017-12-14: qty 100

## 2017-12-14 MED ORDER — IOPAMIDOL (ISOVUE-300) INJECTION 61%
100.0000 mL | Freq: Once | INTRAVENOUS | Status: AC | PRN
  Administered 2017-12-14: 100 mL via INTRAVENOUS

## 2017-12-14 MED ORDER — ONDANSETRON HCL 4 MG/2ML IJ SOLN
4.0000 mg | Freq: Once | INTRAMUSCULAR | Status: DC
  Filled 2017-12-14: qty 2

## 2017-12-14 NOTE — ED Notes (Addendum)
Patient states "I don't think I need an IV and pain medicine. My pain is better now and I don't want to have to go through all of that if I don't need to stay the night in the hospital." This RN explained to the patient that she has the right to refuse any treatment and/or medication.

## 2017-12-14 NOTE — ED Triage Notes (Addendum)
Pt arrived from home via [facility] c/o severe abdominal pain. Hx of pancreatic cancer (Sept 2018). Pt takes tramadol for pain control at home. Receives treatment for pancreatic cancer at [facility] cancer center. Pt woke up with severe pain at 0700. A/Ox4 and ambulatory.

## 2017-12-14 NOTE — ED Provider Notes (Signed)
[facility] DEPT Provider Note   CSN: [id number] Arrival date & time: 12/14/17  1011     History   Chief Complaint Chief Complaint  Patient presents with  . Abdominal Pain  . Constipation    HPI MOANA MUNFORD is a 84 y.o. female.  Patient with hx pancreatic cancer, c/o epigastric pain. States has chronic pain to area, but that normally it is manageable, but worse in past day. Pain constant, dull, severe, non radiating. Worse w palpation. Pt takes ultram prn. Nausea. No vomiting. Is having bms, including yesterday. No abd distension.  The history is provided by the patient.  Abdominal Pain   Pertinent negatives include fever, diarrhea, vomiting, dysuria and headaches.  Constipation   Associated symptoms include abdominal pain. Pertinent negatives include no dysuria.    Past Medical History:  Diagnosis Date  . Arthritis   . Common bile duct (CBD) obstruction   . Difficult intubation Anterior glottis, short TMD, limited mouth opening. Required glidescope and bougie stylet with multiple attempts.  . Diverticular disease   . Diverticulosis   . Essential hypertension   . Hypertension   . Hypothyroidism   . Jaundice   . Mitral valve prolapse   . Thyroid disease     Patient Active Problem List   Diagnosis Date Noted  . Pancreatic adenocarcinoma ([place]) 05/03/2017  . Gastric outlet obstruction   . Abdominal pain 11/24/2016  . UTI (urinary tract infection) 11/24/2016  . Essential hypertension   . Common bile duct (CBD) obstruction   . Biliary obstruction 08/06/2015  . Anemia 08/06/2015  . Hypothyroidism 06/13/2013  . Diverticulosis of colon without hemorrhage 06/13/2013  . S/P hernia repair 06/13/2013    Past Surgical History:  Procedure Laterality Date  . BREAST BIOPSY Right   . BREAST CYST EXCISION Right   . ENDOSCOPIC RETROGRADE CHOLANGIOPANCREATOGRAPHY (ERCP) WITH PROPOFOL N/A 08/08/2015   Procedure: ENDOSCOPIC RETROGRADE  CHOLANGIOPANCREATOGRAPHY (ERCP) WITH PROPOFOL;  Surgeon: [name], MD;  Location: [facility];  Service: Endoscopy;  Laterality: N/A;  . 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 ESOPHAGOGASTRODUODENOSCOPY (EGD) WITH PROPOFOL N/A 11/25/2016   Procedure: ESOPHAGOGASTRODUODENOSCOPY (EGD) WITH PROPOFOL;  Surgeon: [name], MD;  Location: [facility];  Service: Endoscopy;  Laterality: N/A;  . EUS N/A 08/08/2015   Procedure: UPPER ENDOSCOPIC ULTRASOUND (EUS) RADIAL;  Surgeon: [name], MD;  Location: [facility];  Service: Endoscopy;  Laterality: N/A;  . INGUINAL HERNIA REPAIR     right  . LAPAROTOMY N/A 11/27/2016   Procedure: GASTROJEJUNOSTOMY;  Surgeon: Leighton Ruff, MD;  Location: [facility];  Service: General;  Laterality: N/A;  . PANCREAS BIOPSY  11/27/2016   Procedure: PANCREACTIC BIOPSY;  Surgeon: Leighton Ruff, MD;  Location: [facility];  Service: General;;  . Bess Kinds CHOLANGIOSCOPY N/A 12/26/2015   Procedure: FAOZHYQM CHOLANGIOSCOPY;  Surgeon: [name], MD;  Location: [facility];  Service: Endoscopy;  Laterality: N/A;  . TONSILLECTOMY       OB History   None      Home Medications    Prior to Admission medications   Medication Sig Start Date End Date Taking? Authorizing Provider  amLODipine (NORVASC) 10 MG tablet Take 10 mg by mouth at bedtime.  06/16/15  Yes [provider]  Cholecalciferol (VITAMIN D3) 400  units CAPS Take 2 capsules by mouth daily.   Yes [provider]  levothyroxine (SYNTHROID, LEVOTHROID) 25 MCG tablet Take 25 mcg by mouth daily before breakfast.   Yes [provider]  lipase/protease/amylase (CREON) 12000 units CPEP capsule Take 2 capsules (24,000 Units  total) by mouth 3 (three) times daily before meals. Patient taking differently: Take 24,000 Units by mouth 2 (two) times daily.  07/29/17  Yes Truitt Merle, MD  lisinopril (PRINIVIL,ZESTRIL) 5 MG tablet Take 5 mg by mouth at bedtime.    Yes [provider]  Multiple Vitamins-Minerals (MULTIVITAMIN GUMMIES ADULT) CHEW Chew 1 tablet by mouth daily.   Yes [provider]  polyethylene glycol powder (GLYCOLAX/MIRALAX) powder Take 17 g by mouth daily as needed for mild constipation.    Yes [provider]  polyvinyl alcohol (LIQUIFILM TEARS) 1.4 % ophthalmic solution Place 1 drop into both eyes daily as needed for dry eyes.   Yes [provider]  traMADol (ULTRAM) 50 MG tablet Take 1 tablet (50 mg total) by mouth every 6 (six) hours as needed. 08/16/17  Yes Truitt Merle, MD  hyoscyamine (LEVSIN, ANASPAZ) 0.125 MG tablet Take 1 tablet (0.125 mg total) by mouth 3 (three) times daily as needed. Patient not taking: Reported on 12/14/2017 04/20/17   Willia Craze, NP    Family History Family History  Problem Relation Age of Onset  . Breast cancer Sister 76       treated with radiation  . Heart disease Father     Social History Social History   Tobacco Use  . Smoking status: Former Smoker    Packs/day: 1.00    Years: 10.00    Pack years: 10.00    Types: Cigarettes    Last attempt to quit: 1957    Years since quitting: 62.3  . Smokeless tobacco: Never Used  Substance Use Topics  . Alcohol use: No    Comment: no recent   . Drug use: No     Allergies   Sulfamethoxazole   Review of Systems Review of Systems  Constitutional: Negative for fever.  HENT: Negative for sore throat.   Eyes: Negative for redness.  Respiratory: Negative for cough and shortness of breath.   Cardiovascular: Negative for chest pain.  Gastrointestinal: Positive for abdominal pain. Negative for diarrhea and vomiting.  Genitourinary: Negative for dysuria and flank pain.    Musculoskeletal: Negative for back pain and neck pain.  Skin: Negative for rash.  Neurological: Negative for headaches.  Hematological: Does not bruise/bleed easily.  Psychiatric/Behavioral: Negative for confusion.     Physical Exam Updated Vital Signs BP 134/73 (BP Location: Left Arm)   Pulse 70   Temp (!) 97.4 F (36.3 C) (Oral)   Resp 17   Ht 1.524 m (5')   Wt 38.6 kg (85 lb)   SpO2 100%   BMI 16.60 kg/m   Physical Exam  Constitutional: She appears well-developed and well-nourished. No distress.  HENT:  Mouth/Throat: Oropharynx is clear and moist.  Eyes: Conjunctivae are normal. No scleral icterus.  Neck: Neck supple. No tracheal deviation present.  Cardiovascular: Normal rate, regular rhythm, normal heart sounds and intact distal pulses. Exam reveals no gallop and no friction rub.  No murmur heard. Pulmonary/Chest: Effort normal and breath sounds normal. No respiratory distress.  Abdominal: Soft. Normal appearance and bowel sounds are normal. She exhibits no distension. There is tenderness.  +epigastric tenderness.   Genitourinary:  Genitourinary Comments: No cva tenderness.   Musculoskeletal: She  exhibits no edema.  Neurological: She is alert.  Skin: Skin is warm and dry. No rash noted. She is not diaphoretic.  Psychiatric: She has a normal mood and affect.  Nursing note and vitals reviewed.    ED Treatments / Results  Labs (all labs ordered are listed, but only abnormal results are displayed) Results for orders placed or performed during the hospital encounter of 12/14/17  CBC  Result Value Ref Range   WBC 4.8 4.0 - 10.5 K/uL   RBC 3.97 3.87 - 5.11 MIL/uL   Hemoglobin 12.2 12.0 - 15.0 g/dL   HCT 36.7 36.0 - 46.0 %   MCV 92.4 78.0 - 100.0 fL   MCH 30.7 26.0 - 34.0 pg   MCHC 33.2 30.0 - 36.0 g/dL   RDW 14.1 11.5 - 15.5 %   Platelets 265 150 - 400 K/uL  Comprehensive metabolic panel  Result Value Ref Range   Sodium 139 135 - 145 mmol/L   Potassium 3.8  3.5 - 5.1 mmol/L   Chloride 106 101 - 111 mmol/L   CO2 23 22 - 32 mmol/L   Glucose, Bld 127 (H) 65 - 99 mg/dL   BUN 10 6 - 20 mg/dL   Creatinine, Ser 0.55 0.44 - 1.00 mg/dL   Calcium 9.5 8.9 - 10.3 mg/dL   Total Protein 7.3 6.5 - 8.1 g/dL   Albumin 4.3 3.5 - 5.0 g/dL   AST 24 15 - 41 U/L   ALT 20 14 - 54 U/L   Alkaline Phosphatase 86 38 - 126 U/L   Total Bilirubin 0.4 0.3 - 1.2 mg/dL   GFR calc non Af Amer >60 >60 mL/min   GFR calc Af Amer >60 >60 mL/min   Anion gap 10 5 - 15  Lipase, blood  Result Value Ref Range   Lipase 36 11 - 51 U/L    EKG None  Radiology Ct Abdomen Pelvis W Contrast  Result Date: 12/14/2017 CLINICAL DATA:  Chronic abdominal pain for the past 3 years, acutely worsened over the past week. EXAM: CT ABDOMEN AND PELVIS WITH CONTRAST TECHNIQUE: Multidetector CT imaging of the abdomen and pelvis was performed using the standard protocol following bolus administration of intravenous contrast. CONTRAST:  162mL ISOVUE-300 IOPAMIDOL (ISOVUE-300) INJECTION 61% COMPARISON:  CT abdomen pelvis dated November 02, 2017. FINDINGS: Lower chest: No acute abnormality. Stable 3 mm subpleural nodule in the right lower lobe. Hepatobiliary: No focal liver abnormality. The gallbladder remains distended. No gallbladder wall thickening or gallstones. Unchanged pneumobilia related to common bile duct stent. No biliary dilatation. Pancreas: Ill-defined mass in the head of the pancreas is grossly unchanged. Stable atrophy of the pancreatic body and tail with main pancreatic duct dilatation. Spleen: Normal in size without focal abnormality. Adrenals/Urinary Tract: The adrenal glands are unremarkable. Subcentimeter low-density lesions within both kidneys remain too small to characterize but are unchanged. No renal or ureteral calculi. No hydronephrosis. The bladder is unremarkable. Stomach/Bowel: Small hiatal hernia. Prior gastrojejunostomy. No bowel wall thickening, distention, or surrounding  inflammatory changes. Moderate colonic stool burden. Normal appendix. Vascular/Lymphatic: No significant vascular findings are present. Patent portal system with unchanged mass effect on the portosplenic confluence. Unchanged 7 mm perigastric lymph node (series 2, image 18). No enlarged abdominal or pelvic lymph nodes. Reproductive: Uterus and bilateral adnexa are unremarkable. Other: No abdominal wall hernia or abnormality. No abdominopelvic ascites. No pneumoperitoneum. Musculoskeletal: No acute or significant osseous findings. IMPRESSION: 1.  No acute intra-abdominal process. 2. Relatively unchanged ill-defined pancreatic head mass,  consistent with adenocarcinoma. 3.  Prominent stool throughout the colon favors constipation. Electronically Signed   By: Titus Dubin M.D.   On: 12/14/2017 13:36    Procedures Procedures (including critical care time)  Medications Ordered in ED Medications  HYDROmorphone (DILAUDID) injection 0.5 mg (has no administration in time range)  ondansetron (ZOFRAN) injection 4 mg (has no administration in time range)  sodium chloride 0.9 % bolus 500 mL (has no administration in time range)     Initial Impression / Assessment and Plan / ED Course  I have reviewed the triage vital signs and the nursing notes.  Pertinent labs & imaging results that were available during my care of the patient were reviewed by me and considered in my medical decision making (see chart for details).  Labs. Iv ns bolus. diluadid .5 mg iv. zofran iv.  Reviewed nursing notes and prior charts for additional history. Recent ct with enlarging pancreatic mass.   Labs reviewed - lipase normal.  Ct reviewed - pancreatic mass - no acute process.  Recheck pain improved.   Pt appears stable for d/c.     Final Clinical Impressions(s) / ED Diagnoses   Final diagnoses:  None    ED Discharge Orders    None       Lajean Saver, MD 12/14/17 1345

## 2017-12-14 NOTE — Discharge Instructions (Signed)
It was our pleasure to provide your ER care today - we hope that you feel better.  Take your ultram up to 3x/day as need.  Follow up with your doctor as planned in the coming week.  Return to ER if worse, new symptoms, fevers, persistent vomiting, other concern.

## 2017-12-14 NOTE — ED Notes (Signed)
Patient verbalized agreement to receiving a 561mL NS fluid bolus and CT abdomen with contrast.

## 2017-12-15 NOTE — Progress Notes (Signed)
[place]  [phone] [phone]  Clinic Follow Up Note   Patient Care Team: Crist Infante, MD as PCP - General (Internal Medicine) [name], MD as Attending Physician (Gastroenterology) Truitt Merle, MD as Consulting Physician (Hematology) 12/20/2017  CHIEF COMPLAINTS:  Follow up pancreatic adenocarcinoma   Oncology History   Cancer Staging Pancreatic adenocarcinoma [facility]) Staging form: Exocrine Pancreas, AJCC 8th Edition - Clinical stage from 05/19/2017: Stage IB (cT2, cN0, cM0) - Signed by Truitt Merle, MD on 05/25/2017       Pancreatic adenocarcinoma ([place])   08/06/2015 Imaging    CT ABD/PELVIS IMPRESSION: 1. Intra and extrahepatic biliary dilatation with an abrupt cut off of the common bile duct near its entry into the pancreatic head. Findings most suspicious for a biliary tumor (cholangiocarcinoma) but a common bile duct stone and pancreatic lesion are also possible. MRCP may be helpful for further evaluation if the patient can hold her breath. Otherwise, ERCP is suggested for both diagnostic and therapeutic purposes. 2. Moderate distention of the gallbladder without obvious gallstones. 3. No liver lesions or abdominal lymphadenopathy.       08/08/2015 Procedure    EUS and ERCP per Dr. [name]: indicated for painless juandice, dilated biliary tree  Very vaguely abnormal region in the pancreatic head. This was hypoechoic and SOMEWHAT masslike, measured 2cm Maximally.  2. CBD was dilated up to 45m, tapered abruptly in the head of pancreas. 3. Main pancreatic duct was non-dilated 4. No peripancreatic adenopathy.      08/08/2015 Pathology Results    EUS path: Common Bile Duct , bx SMALL FRAGMENT OF BILE DUCT EPITHELIUM, PERIDUCTAL GLANDS AND FIBROMUSCULAR TISSUE NEGATIVE FOR MALIGNANCY      08/08/2015 Procedure    IR biliary stent placement       09/05/2015 Pathology Results    1. Soft tissue, biopsy, common bile  duct - SCANT SOFT TISSUE AND NECROSIS WITH ACUTE INFLAMMATION. - SCANT BENIGN GLANDULAR EPITHLEIUM. - NO MALIGNANCY IDENTIFIED. 2. Soft tissue, biopsy, common bile duct - SOFT TISSUE AND NECROSIS WITH ACUTE INFLAMMATION. - BENIGN PANCREATIC TISSUE. - NO MALIGNANCY IDENTIFIED.      09/05/2015 Imaging    CT ABD/PELVIS IMPRESSION: 1. No acute abnormality seen to explain the patient's symptoms. 2. Scattered pneumobilia is slightly more prominent, status post placement of a common bile duct stent. External biliary drainage catheter noted in expected position, adjacent to the upper aspect of the common bile duct stent. 3. Vague soft tissue edema about the pancreatic head and second segment of duodenum is relatively stable from the prior CT, and likely reflects the recent procedure. 4. Minimal diverticulosis at the cecum, without evidence of diverticulitis. 5. Mild degenerative change along the lumbar spine.       12/26/2015 Procedure    ERCP for stent replacement to bile duct stricture       12/26/2015 Pathology Results    BILE DUCT BRUSHING WITH STENT (SPECIMEN 1 OF 1, COLLECTED ON 12/26/15): NO MALIGNANT CELLS IDENTIFIED.      11/27/2016 Initial Biopsy    Pancreas, biopsy, mass Small focus of atypical glands, suspicious for infiltrating adenocarcinoma      11/27/2016 Procedure    POST-OPERATIVE DIAGNOSIS:  Gastric outlet obstruction  PROCEDURE:   GASTROJEJUNOSTOMY, BIOPSY PANCREATIC HEAD MASS       04/23/2017 Imaging    CT AP 04/23/17 IMPRESSION: 3.5 cm pancreatic head mass surrounding the common bile duct stent, consistent with pancreatic adenocarcinoma.  No evidence of metastatic disease within the  abdomen or pelvis.      05/03/2017 Initial Diagnosis    Pancreatic adenocarcinoma ([place])      05/13/2017 Imaging    CT Chest WO Contrast 05/13/17 IMPRESSION: No definite evidence of intrathoracic metastatic disease. Old granulomatous disease. Aortic Atherosclerosis         05/19/2017 Pathology Results    Diagnosis 05/19/17 Pancreas, biopsy - ADENOCARCINOMA.  MMR was normal       11/02/2017 Imaging    CT AP W Contrast  IMPRESSION: 1. Interval increase in size of ill-defined pancreatic head mass consistent with known adenocarcinoma. 2. Interval development of a small perigastric lymph node. Continued close attention on follow-up recommended. 3. Slight increase and mass-effect on the portal splenic confluence by the pancreatic head mass although no vascular complication evident on the current study.      12/14/2017 Imaging    CT AP W Contrast  IMPRESSION: 1.  No acute intra-abdominal process. 2. Relatively unchanged ill-defined pancreatic head mass, consistent with adenocarcinoma. 3.  Prominent stool throughout the colon favors constipation.      HISTORY OF PRESENTING ILLNESS:   Summer Jones 84 y.o. female is here because of suspicion for pancreatic adenocarcinoma. She has been under the care of Dr. [name] since 2016. She was referred by Dr. [name], she presents to the clinic with her husband and 2 sons.  In 2016, constipation and scleral juandice prompted the patient to seek care with her PCP. She was found to have intrahepatic and extrahepatic biliary dilation on CT abd/pelvis on 08/06/2015. A percutaneous drainage tube was placed then later exchanged for biliary stent. Jaundice resolved at that time. The stent was replaced in 12/2015. She was hospitalized in April 2018 for constipation. She had one episode of nausea and vomiting on the way to the ER at that time. Surgical biopsy by Dr. [name] on 11/27/16 showed atypical cells concerning for pancreatic adenocarcinoma but EUS brushings by Dr. [name] showed no malignancy. In 04/2017 she developed bilateral lower abdominal pain and soreness, rating this 8/10, and constipation. She had minimal blood on tissue with wiping and has known hemorrhoids. CT abdomen and pelvis revealed 3.5 pancreatic head  mass surrounding the common bile duct stent, consistent with pancreatic adenocarcinoma. For her pain, she takes ibuprofen but does not like to take medication due to chronic constipation. Associated with bloating, distention, and constipation. She uses miralax and prune juice. Last BM 05/04/17 which was small, soft and difficult. No nausea, vomiting, weight loss, or decreased appetite. However, she limits food due to fear of subsequent abdominal pain. She has minimal fatigue. Chronic dry cough from lisinopril.    CURRENT THERAPY: Supportive care   INTERVAL HISTORY:  Summer Jones is here for a follow up. She presents to the clinic today accompanied by her husband. She reports she went to the ED on 12/14/17 for abominal pain. It was contributed to constipation and resolves on its on.   On review of systems, pt denies any other complaints at this time. Pertinent positives are listed and detailed within the above HPI.   MEDICAL HISTORY:  Past Medical History:  Diagnosis Date  . Arthritis   . Common bile duct (CBD) obstruction   . Difficult intubation Anterior glottis, short TMD, limited mouth opening. Required glidescope and bougie stylet with multiple attempts.  . Diverticular disease   . Diverticulosis   . Essential hypertension   . Hypertension   . Hypothyroidism   . Jaundice   . Mitral valve prolapse   .  Thyroid disease    SURGICAL HISTORY: Past Surgical History:  Procedure Laterality Date  . BREAST BIOPSY Right   . BREAST CYST EXCISION Right   . ENDOSCOPIC RETROGRADE CHOLANGIOPANCREATOGRAPHY (ERCP) WITH PROPOFOL N/A 08/08/2015   Procedure: ENDOSCOPIC RETROGRADE CHOLANGIOPANCREATOGRAPHY (ERCP) WITH PROPOFOL;  Surgeon: [name], MD;  Location: [facility];  Service: Endoscopy;  Laterality: N/A;  . 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 ESOPHAGOGASTRODUODENOSCOPY (EGD) WITH PROPOFOL N/A 11/25/2016   Procedure: ESOPHAGOGASTRODUODENOSCOPY (EGD) WITH PROPOFOL;  Surgeon: [name], MD;  Location: [facility];  Service: Endoscopy;  Laterality: N/A;  . EUS N/A 08/08/2015   Procedure: UPPER ENDOSCOPIC ULTRASOUND (EUS) RADIAL;  Surgeon: [name], MD;  Location: [facility];  Service: Endoscopy;  Laterality: N/A;  . INGUINAL HERNIA REPAIR     right  . LAPAROTOMY N/A 11/27/2016   Procedure: GASTROJEJUNOSTOMY;  Surgeon: Leighton Ruff, MD;  Location: [facility];  Service: General;  Laterality: N/A;  . PANCREAS BIOPSY  11/27/2016   Procedure: PANCREACTIC BIOPSY;  Surgeon: Leighton Ruff, MD;  Location: [facility];  Service: General;;  . Bess Kinds CHOLANGIOSCOPY N/A 12/26/2015   Procedure: QIHKVQQV CHOLANGIOSCOPY;  Surgeon: [name], MD;  Location: [facility];  Service: Endoscopy;  Laterality: N/A;  . TONSILLECTOMY     SOCIAL HISTORY: Social History   Socioeconomic History  . Marital status: Married    Spouse name: Not on file  . Number of children: 2  . Years of education: Not on file  . Highest education level: Not on file  Occupational History  . Occupation: Retired  Scientific laboratory technician  . Financial resource strain: Not on file  . Food insecurity:    Worry: Not on file    Inability: Not on file  . Transportation needs:    Medical: Not on file    Non-medical: Not on file  Tobacco Use  . Smoking status: Former Smoker    Packs/day: 1.00    Years: 10.00    Pack years: 10.00    Types: Cigarettes    Last attempt to quit: 1957    Years since quitting: 62.3  . Smokeless tobacco: Never Used  Substance and Sexual Activity  . Alcohol use: No    Comment: no recent   . Drug use: No  . Sexual activity: Not on file  Lifestyle  . Physical activity:    Days per week: Not on file    Minutes per session: Not  on file  . Stress: Not on file  Relationships  . Social connections:    Talks on phone: Not on file    Gets together: Not on file    Attends religious service: Not on file    Active member of club or organization: Not on file    Attends meetings of clubs or organizations: Not on file    Relationship status: Not on file  . Intimate partner violence:    Fear of current or ex partner: Not on file    Emotionally abused: Not on file    Physically abused: Not on file    Forced sexual activity: Not on file  Other Topics Concern  . Not on file  Social History Narrative  . Not on file   FAMILY HISTORY: Family History  Problem Relation Age of Onset  .  Breast cancer Sister 55       treated with radiation  . Heart disease Father    ALLERGIES:  is allergic to sulfamethoxazole.  MEDICATIONS:  Current Outpatient Medications  Medication Sig Dispense Refill  . amLODipine (NORVASC) 10 MG tablet Take 10 mg by mouth at bedtime.     . Cholecalciferol (VITAMIN D3) 400 units CAPS Take 2 capsules by mouth daily.    . hyoscyamine (LEVSIN, ANASPAZ) 0.125 MG tablet Take 1 tablet (0.125 mg total) by mouth 3 (three) times daily as needed. 90 tablet 1  . levothyroxine (SYNTHROID, LEVOTHROID) 25 MCG tablet Take 25 mcg by mouth daily before breakfast.    . lipase/protease/amylase (CREON) 12000 units CPEP capsule Take 2 capsules (24,000 Units total) by mouth 3 (three) times daily before meals. (Patient taking differently: Take 24,000 Units by mouth 2 (two) times daily. ) 540 capsule 1  . lisinopril (PRINIVIL,ZESTRIL) 5 MG tablet Take 5 mg by mouth at bedtime.     . Multiple Vitamins-Minerals (MULTIVITAMIN GUMMIES ADULT) CHEW Chew 1 tablet by mouth daily.    . polyethylene glycol powder (GLYCOLAX/MIRALAX) powder Take 17 g by mouth daily as needed for mild constipation.     . polyvinyl alcohol (LIQUIFILM TEARS) 1.4 % ophthalmic solution Place 1 drop into both eyes daily as needed for dry eyes.    . traMADol  (ULTRAM) 50 MG tablet Take 1 tablet (50 mg total) by mouth every 6 (six) hours as needed. 30 tablet 1   No current facility-administered medications for this visit.    REVIEW OF SYSTEMS:   Constitutional: Denies fevers, chills or abnormal night sweats. Stable weight (+) low appetite  Eyes: Denies blurriness of vision, double vision or watery eyes Ears, nose, mouth, throat, and face: Denies mucositis or sore throat Respiratory: Denies dyspnea or wheezes (+) chronic dry cough, secondary to lisinopril Cardiovascular: Denies palpitation, chest discomfort or lower extremity swelling Gastrointestinal:  Denies nausea, heartburn or change in bowel habits (+) constipation (+) diffuse lower abdominal pain, bilateral Skin: Denies abnormal skin rashes Lymphatics: Denies new lymphadenopathy or easy bruising Neurological:Denies numbness, tingling or new weaknesses Behavioral/Psych: Mood is stable, no new changes  All other systems were reviewed with the patient and are negative.  PHYSICAL EXAMINATION:  ECOG PERFORMANCE STATUS: 1 - Symptomatic but completely ambulatory  Vitals:   12/20/17 1532  BP: (!) 146/62  Pulse: 63  Resp: 20  Temp: 97.9 F (36.6 C)  SpO2: 99%   Filed Weights   12/20/17 1532  Weight: 83 lb 8 oz (37.9 kg)     GENERAL:alert, no distress and comfortable SKIN: skin color, texture, turgor are normal, no rashes or significant lesions EYES: normal, conjunctiva are pink and non-injected, sclera clear OROPHARYNX:no exudate, no erythema and lips, buccal mucosa, and tongue normal  NECK: supple, thyroid normal size, non-tender, without nodularity LYMPH:  no palpable lymphadenopathy in the cervical, axillary or inguinal LUNGS: clear to auscultation and percussion with normal breathing effort HEART: regular rate & rhythm and no murmurs and no lower extremity edema ABDOMEN:abdomen soft and normal bowel sounds.  There is a 2-3 cm mass in the right side of her umbilical, nontender.  No  organomegaly.  She has moderate tenderness in the epigastric area, no palpable mass in the area. Musculoskeletal:no cyanosis of digits and no clubbing  PSYCH: alert & oriented x 3 with fluent speech NEURO: no focal motor/sensory deficits  LABORATORY DATA:  I have reviewed the data as listed CBC Latest Ref Rng & Units  12/14/2017 10/19/2017 08/16/2017  WBC 4.0 - 10.5 K/uL 4.8 4.7 4.2  Hemoglobin 12.0 - 15.0 g/dL 12.2 12.1 12.4  Hematocrit 36.0 - 46.0 % 36.7 36.9 38.1  Platelets 150 - 400 K/uL 265 284 258   CMP Latest Ref Rng & Units 12/14/2017 10/19/2017 08/16/2017  Glucose 65 - 99 mg/dL 127(H) 109 114  BUN 6 - 20 mg/dL 10 10 8.6  Creatinine 0.44 - 1.00 mg/dL 0.55 0.71 0.8  Sodium 135 - 145 mmol/L 139 134(L) 137  Potassium 3.5 - 5.1 mmol/L 3.8 4.8 4.0  Chloride 101 - 111 mmol/L 106 99 -  CO2 22 - 32 mmol/L _0 Calcium 8.9 - 10.3 mg/dL 9.5 9.9 9.5  Total Protein 6.5 - 8.1 g/dL 7.3 7.3 7.4  Total Bilirubin 0.3 - 1.2 mg/dL 0.4 0.4 0.41  Alkaline Phos 38 - 126 U/L 86 88 91  AST 15 - 41 U/L 24 25 37(H)  ALT 14 - 54 U/L 20 20 40   PATHOLOGY  Diagnosis 05/19/17 Pancreas, biopsy - ADENOCARCINOMA. - SEE COMMENT. Microscopic Comment Dr. Claudette Laws has reviewed the case and concurs with this interpretation. Dr. Burr Medico was paged on 05/20/2017. Per request, MMR by [facility] testing will be performed and the results reported separately. (JBK:ah 05/20/17)   Diagnosis 11/27/2016 Pancreas, biopsy, mass SMALL FOCUS OF ATYPICAL GLANDS, SUSPICIOUS FOR INFILTRATING ADENOCARCINOMA. Microscopic Comment Multiple levels of tissue has been exam, due to limited atypical cells, a definitive diagnosis can not be reached. This case also reviewed by Drs. Saralyn Pilar and Smir and agree.  RADIOGRAPHIC STUDIES: I have personally reviewed the radiological images as listed and agreed with the findings in the report. Ct Abdomen Pelvis W Contrast  Result Date: 12/14/2017 CLINICAL DATA:  Chronic abdominal pain for the  past 3 years, acutely worsened over the past week. EXAM: CT ABDOMEN AND PELVIS WITH CONTRAST TECHNIQUE: Multidetector CT imaging of the abdomen and pelvis was performed using the standard protocol following bolus administration of intravenous contrast. CONTRAST:  156m ISOVUE-300 IOPAMIDOL (ISOVUE-300) INJECTION 61% COMPARISON:  CT abdomen pelvis dated November 02, 2017. FINDINGS: Lower chest: No acute abnormality. Stable 3 mm subpleural nodule in the right lower lobe. Hepatobiliary: No focal liver abnormality. The gallbladder remains distended. No gallbladder wall thickening or gallstones. Unchanged pneumobilia related to common bile duct stent. No biliary dilatation. Pancreas: Ill-defined mass in the head of the pancreas is grossly unchanged. Stable atrophy of the pancreatic body and tail with main pancreatic duct dilatation. Spleen: Normal in size without focal abnormality. Adrenals/Urinary Tract: The adrenal glands are unremarkable. Subcentimeter low-density lesions within both kidneys remain too small to characterize but are unchanged. No renal or ureteral calculi. No hydronephrosis. The bladder is unremarkable. Stomach/Bowel: Small hiatal hernia. Prior gastrojejunostomy. No bowel wall thickening, distention, or surrounding inflammatory changes. Moderate colonic stool burden. Normal appendix. Vascular/Lymphatic: No significant vascular findings are present. Patent portal system with unchanged mass effect on the portosplenic confluence. Unchanged 7 mm perigastric lymph node (series 2, image 18). No enlarged abdominal or pelvic lymph nodes. Reproductive: Uterus and bilateral adnexa are unremarkable. Other: No abdominal wall hernia or abnormality. No abdominopelvic ascites. No pneumoperitoneum. Musculoskeletal: No acute or significant osseous findings. IMPRESSION: 1.  No acute intra-abdominal process. 2. Relatively unchanged ill-defined pancreatic head mass, consistent with adenocarcinoma. 3.  Prominent stool  throughout the colon favors constipation. Electronically Signed   By: WTitus DubinM.D.   On: 12/14/2017 13:36   ASSESSMENT & PLAN: AShaya Reddickis a 84y.o.  female with arthritis, HTN, thyroid disease, history of bilary duct stent, and pancreas mass suspicious for adenocarcinoma.   1. Pancreatic adenocarcinoma in head, cT2N0Mo, stage IB, MMR normal, untreated  -I previously reviewed the patient's medical records extensively with the patient and family.  -She had multiple bile duct brushings, cytologies were negative. Her last fine-needle biopsy of the pancreatic mass during her gastric bypass surgery for gastric outlet obstructions in April 2018 showed small cluster of atypical cells, suspicious for adenocarcinoma, but not definitive. -she finally underwent CT-guided biopsy of the pancreatic mass, which confirmed adenocarcinoma  -Her cancer appears to be indolent, mostly asymptomatic over the past two years.  -surgery is not a likely option due to the location of her tumor and her advanced age, although there is no clear vascular invasion by the tumor on the CT scan.  -The goal of therapy is palliative, if no surgery. We previously discussed various options including palliative radiation and chemotherapy -Chemotherapy is a possible option to control symptoms and improve quality of life, such as single agent Gemcitabine  -I previously discussed that her MMR came back normal which means she is not eligable for immunotherapy  -Due to her advanced age, relatively asymptomatic, she has decided not to pursue any anticancer treatment or surgery. -She developed worsening abdominal pain lately, exam from 10/19/17 showed a 2-3 cm palpable mass in the right umbilical area.  I recommended a CT abdomen and pelvis with contrast to rule out metastasis. -I called pt and discussed CT AP W Contrast from 11/02/17 findings with her. The primary pancreatic mass has increased in size, but no evidence of metastasis.  I  think part of her abdominal pain is related to her constipation, CT scan did show moderate to large volume of stool in the colon. She voiced good understanding, and appreciated the call. -Pt went to the ED on 12/14/17 with complaints of abdominal pain, her CT AP W Contrast was unchanged from last scan.  -She is clinically doing well other than her chronic abdominal pain which is likely related to her constipation. -Her tumor marker CA19.9 is slowly increasing, 990 on 10/19/17. I advised her to call if she has any new symptoms or concerns.  Her cancer is overall indolent. -Continue observation, will hold on treatment for now since she is asymptomatic. -F/u in 3 months   2. Abdominal pain -not typical from the pancreatic tumor given the intermittent nature, her pain is located in the mid-low abdomen, possible related to her bowel spasm or constipation  -We previously discussed options of pain medicines, patient declined due to her concern of constipation -She has taken chewing ibuprofen  -Dr. [name] prescribed her hyoscyamine  -I previously suggested starting with liquid tylenol 320-661m for adult dose. It not enough I will order her Tylenol #3.  -I previously discussed pain medication can cause constipation so she would watch her BMs.  - I also previously suggested Pancreatic enzyme Supplement, Creon, 2400U before each meal to help her GI system. -I gave her prescription of tramadol on 08/16/17, she knows to crush the medicine if needed. -Due to the recent worsening abdominal pain, and a palpable small mass in the umbilical area, I recommend a CT scan. -CT AP W Contrast from 11/02/17 revealed that the primary pancreatic mass increased in size, but no evidence of metastasis. I suspect part of her abdominal pain is related to her constipation, CT scan did show moderate to large volume of stool in the colon. I encouraged her to  continue daily low-dose MiraLAX, and the use for dose of MiraLAX or magnesium  citrate once a week to clean her bowel. -Her abdominal pain is continued almost every day. It seems that it is mostly contributed to constipation. We reviewed an anti-constipation plan again.  -she has tried Tramadol with improvement but she is worried about adding to her constipation. I advised her she can take Tylenol or advil.   3. Chronic constipation -We previously discussed constipation management, I encouraged her to take a stool softener, and increase MiraLAX dose -We previously discussed other laxatives options, such as magnesium citrate for severe constipation - I encouraged her again to use full dose miraLAX and use magnesium citrate weekly. She can eat high fiber diet with fruits and vegetables with prune juice.   4. Hypertension, hypothyroidism -Follow up with primary care physician  Plan: -Reviewed anti-constipation plan today -Lab and f/u in 3 months  -will see her back sooner if needed   No orders of the defined types were placed in this encounter.  All questions were answered. The patient knows to call the clinic with any problems, questions or concerns.  I spent 20 minutes counseling the patient face to face. The total time spent in the appointment was 25 minutes and more than 50% was on counseling.  This document serves as a record of services personally performed by Truitt Merle, MD. It was created on her behalf by Theresia Bough, a trained medical scribe. The creation of this record is based on the scribe's personal observations and the provider's statements to them.   I have reviewed the above documentation for accuracy and completeness, and I agree with the above.    Truitt Merle, MD 12/20/2017 4:10 PM

## 2017-12-17 ENCOUNTER — Telehealth: Payer: Self-pay | Admitting: Hematology

## 2017-12-17 NOTE — Telephone Encounter (Signed)
Per 5/2 message 5/6 lab was cancelled

## 2017-12-20 ENCOUNTER — Inpatient Hospital Stay: Payer: Medicare Other | Attending: Hematology | Admitting: Hematology

## 2017-12-20 ENCOUNTER — Telehealth: Payer: Self-pay | Admitting: Hematology

## 2017-12-20 ENCOUNTER — Other Ambulatory Visit: Payer: Medicare Other

## 2017-12-20 VITALS — BP 146/62 | HR 63 | Temp 97.9°F | Resp 20 | Ht 60.0 in | Wt 83.5 lb

## 2017-12-20 DIAGNOSIS — K5900 Constipation, unspecified: Secondary | ICD-10-CM | POA: Insufficient documentation

## 2017-12-20 DIAGNOSIS — I341 Nonrheumatic mitral (valve) prolapse: Secondary | ICD-10-CM | POA: Diagnosis not present

## 2017-12-20 DIAGNOSIS — C250 Malignant neoplasm of head of pancreas: Secondary | ICD-10-CM | POA: Diagnosis present

## 2017-12-20 DIAGNOSIS — M129 Arthropathy, unspecified: Secondary | ICD-10-CM | POA: Insufficient documentation

## 2017-12-20 DIAGNOSIS — G8929 Other chronic pain: Secondary | ICD-10-CM | POA: Insufficient documentation

## 2017-12-20 DIAGNOSIS — I10 Essential (primary) hypertension: Secondary | ICD-10-CM | POA: Diagnosis not present

## 2017-12-20 DIAGNOSIS — I700 Atherosclerosis of aorta: Secondary | ICD-10-CM | POA: Diagnosis not present

## 2017-12-20 DIAGNOSIS — R109 Unspecified abdominal pain: Secondary | ICD-10-CM | POA: Insufficient documentation

## 2017-12-20 DIAGNOSIS — E039 Hypothyroidism, unspecified: Secondary | ICD-10-CM | POA: Insufficient documentation

## 2017-12-20 DIAGNOSIS — C259 Malignant neoplasm of pancreas, unspecified: Secondary | ICD-10-CM | POA: Insufficient documentation

## 2017-12-20 DIAGNOSIS — Z87891 Personal history of nicotine dependence: Secondary | ICD-10-CM | POA: Diagnosis not present

## 2017-12-20 NOTE — Telephone Encounter (Signed)
Scheduled appt per 5/6 los - gave patient AVS

## 2017-12-21 ENCOUNTER — Encounter: Payer: Self-pay | Admitting: Hematology

## 2018-01-25 ENCOUNTER — Telehealth: Payer: Self-pay

## 2018-01-25 NOTE — Telephone Encounter (Signed)
Nutrition  Patient identified on Malnutrition Screening tool for poor appetite and weight loss.  Called home number at 2 different tames today and busy signal received.  Called cell number and unable to leave message as voicemail box has not been set up.    Please consult RD if can be of assistance.  Zymere Patlan B. Zenia Resides, [place], [place] Registered Dietitian [phone] (pager)

## 2018-03-01 ENCOUNTER — Telehealth: Payer: Self-pay

## 2018-03-01 NOTE — Telephone Encounter (Signed)
Patient called requesting her appointment be moved from 8/9 to earlier date, scheduled her for 7/30 2:00 for lab, f/u with Cira Rue NP at 2:30

## 2018-03-15 ENCOUNTER — Inpatient Hospital Stay: Payer: Medicare Other | Attending: Hematology

## 2018-03-15 ENCOUNTER — Inpatient Hospital Stay (HOSPITAL_BASED_OUTPATIENT_CLINIC_OR_DEPARTMENT_OTHER): Payer: Medicare Other | Admitting: Nurse Practitioner

## 2018-03-15 ENCOUNTER — Encounter: Payer: Self-pay | Admitting: Nurse Practitioner

## 2018-03-15 ENCOUNTER — Telehealth: Payer: Self-pay | Admitting: Hematology

## 2018-03-15 VITALS — BP 128/61 | HR 59 | Temp 98.1°F | Resp 17 | Ht 60.0 in | Wt 80.3 lb

## 2018-03-15 DIAGNOSIS — R1084 Generalized abdominal pain: Secondary | ICD-10-CM

## 2018-03-15 DIAGNOSIS — C250 Malignant neoplasm of head of pancreas: Secondary | ICD-10-CM | POA: Insufficient documentation

## 2018-03-15 DIAGNOSIS — K5900 Constipation, unspecified: Secondary | ICD-10-CM | POA: Insufficient documentation

## 2018-03-15 DIAGNOSIS — E039 Hypothyroidism, unspecified: Secondary | ICD-10-CM | POA: Diagnosis not present

## 2018-03-15 DIAGNOSIS — I341 Nonrheumatic mitral (valve) prolapse: Secondary | ICD-10-CM | POA: Diagnosis not present

## 2018-03-15 DIAGNOSIS — M129 Arthropathy, unspecified: Secondary | ICD-10-CM | POA: Insufficient documentation

## 2018-03-15 DIAGNOSIS — I700 Atherosclerosis of aorta: Secondary | ICD-10-CM

## 2018-03-15 DIAGNOSIS — R634 Abnormal weight loss: Secondary | ICD-10-CM | POA: Diagnosis not present

## 2018-03-15 DIAGNOSIS — I10 Essential (primary) hypertension: Secondary | ICD-10-CM | POA: Diagnosis not present

## 2018-03-15 DIAGNOSIS — R599 Enlarged lymph nodes, unspecified: Secondary | ICD-10-CM | POA: Diagnosis not present

## 2018-03-15 DIAGNOSIS — Z79899 Other long term (current) drug therapy: Secondary | ICD-10-CM | POA: Diagnosis not present

## 2018-03-15 DIAGNOSIS — C259 Malignant neoplasm of pancreas, unspecified: Secondary | ICD-10-CM

## 2018-03-15 LAB — COMPREHENSIVE METABOLIC PANEL
ALK PHOS: 187 U/L — AB (ref 38–126)
ALT: 37 U/L (ref 0–44)
AST: 38 U/L (ref 15–41)
Albumin: 4.3 g/dL (ref 3.5–5.0)
Anion gap: 7 (ref 5–15)
BUN: 14 mg/dL (ref 8–23)
CALCIUM: 9.9 mg/dL (ref 8.9–10.3)
CHLORIDE: 98 mmol/L (ref 98–111)
CO2: 29 mmol/L (ref 22–32)
CREATININE: 0.69 mg/dL (ref 0.44–1.00)
GFR calc Af Amer: >60 mL/min (ref >60)
Glucose, Bld: 116 mg/dL — ABNORMAL HIGH (ref 70–99)
Potassium: 4 mmol/L (ref 3.5–5.1)
Sodium: 134 mmol/L — ABNORMAL LOW (ref 135–145)
TOTAL PROTEIN: 7.5 g/dL (ref 6.5–8.1)
Total Bilirubin: 0.4 mg/dL (ref 0.3–1.2)

## 2018-03-15 LAB — CBC WITH DIFFERENTIAL/PLATELET
BASOS ABS: 0 10*3/uL (ref 0.0–0.1)
Basophils Relative: 1 %
EOS PCT: 3 %
Eosinophils Absolute: 0.1 10*3/uL (ref 0.0–0.5)
HEMATOCRIT: 35.7 % (ref 34.8–46.6)
Hemoglobin: 11.8 g/dL (ref 11.6–15.9)
LYMPHS PCT: 17 %
Lymphs Abs: 0.8 10*3/uL — ABNORMAL LOW (ref 0.9–3.3)
MCH: 30.3 pg (ref 25.1–34.0)
MCHC: 33 g/dL (ref 31.5–36.0)
MCV: 91.8 fL (ref 79.5–101.0)
MONO ABS: 0.5 10*3/uL (ref 0.1–0.9)
MONOS PCT: 10 %
NEUTROS ABS: 3.2 10*3/uL (ref 1.5–6.5)
Neutrophils Relative %: 69 %
PLATELETS: 268 10*3/uL (ref 145–400)
RBC: 3.89 MIL/uL (ref 3.70–5.45)
RDW: 13.9 % (ref 11.2–14.5)
WBC: 4.6 10*3/uL (ref 3.9–10.3)

## 2018-03-15 NOTE — Progress Notes (Signed)
[place]  [phone] [phone]  Clinic Follow up Note   Patient Care Team: Crist Infante, MD as PCP - General (Internal Medicine) [name], MD as Attending Physician (Gastroenterology) Truitt Merle, MD as Consulting Physician (Hematology) 03/15/2018  SUMMARY OF ONCOLOGIC HISTORY: Oncology History   Cancer Staging Pancreatic adenocarcinoma [facility]) Staging form: Exocrine Pancreas, AJCC 8th Edition - Clinical stage from 05/19/2017: Stage IB (cT2, cN0, cM0) - Signed by Truitt Merle, MD on 05/25/2017       Pancreatic adenocarcinoma ([place])   08/06/2015 Imaging    CT ABD/PELVIS IMPRESSION: 1. Intra and extrahepatic biliary dilatation with an abrupt cut off of the common bile duct near its entry into the pancreatic head. Findings most suspicious for a biliary tumor (cholangiocarcinoma) but a common bile duct stone and pancreatic lesion are also possible. MRCP may be helpful for further evaluation if the patient can hold her breath. Otherwise, ERCP is suggested for both diagnostic and therapeutic purposes. 2. Moderate distention of the gallbladder without obvious gallstones. 3. No liver lesions or abdominal lymphadenopathy.       08/08/2015 Procedure    EUS and ERCP per Dr. [name]: indicated for painless juandice, dilated biliary tree  Very vaguely abnormal region in the pancreatic head. This was hypoechoic and SOMEWHAT masslike, measured 2cm Maximally.  2. CBD was dilated up to 87m, tapered abruptly in the head of pancreas. 3. Main pancreatic duct was non-dilated 4. No peripancreatic adenopathy.      08/08/2015 Pathology Results    EUS path: Common Bile Duct , bx SMALL FRAGMENT OF BILE DUCT EPITHELIUM, PERIDUCTAL GLANDS AND FIBROMUSCULAR TISSUE NEGATIVE FOR MALIGNANCY      08/08/2015 Procedure    IR biliary stent placement       09/05/2015 Pathology Results    1. Soft tissue, biopsy, common bile duct - SCANT SOFT TISSUE AND  NECROSIS WITH ACUTE INFLAMMATION. - SCANT BENIGN GLANDULAR EPITHLEIUM. - NO MALIGNANCY IDENTIFIED. 2. Soft tissue, biopsy, common bile duct - SOFT TISSUE AND NECROSIS WITH ACUTE INFLAMMATION. - BENIGN PANCREATIC TISSUE. - NO MALIGNANCY IDENTIFIED.      09/05/2015 Imaging    CT ABD/PELVIS IMPRESSION: 1. No acute abnormality seen to explain the patient's symptoms. 2. Scattered pneumobilia is slightly more prominent, status post placement of a common bile duct stent. External biliary drainage catheter noted in expected position, adjacent to the upper aspect of the common bile duct stent. 3. Vague soft tissue edema about the pancreatic head and second segment of duodenum is relatively stable from the prior CT, and likely reflects the recent procedure. 4. Minimal diverticulosis at the cecum, without evidence of diverticulitis. 5. Mild degenerative change along the lumbar spine.       12/26/2015 Procedure    ERCP for stent replacement to bile duct stricture       12/26/2015 Pathology Results    BILE DUCT BRUSHING WITH STENT (SPECIMEN 1 OF 1, COLLECTED ON 12/26/15): NO MALIGNANT CELLS IDENTIFIED.      11/27/2016 Initial Biopsy    Pancreas, biopsy, mass Small focus of atypical glands, suspicious for infiltrating adenocarcinoma      11/27/2016 Procedure    POST-OPERATIVE DIAGNOSIS:  Gastric outlet obstruction  PROCEDURE:   GASTROJEJUNOSTOMY, BIOPSY PANCREATIC HEAD MASS       04/23/2017 Imaging    CT AP 04/23/17 IMPRESSION: 3.5 cm pancreatic head mass surrounding the common bile duct stent, consistent with pancreatic adenocarcinoma.  No evidence of metastatic disease within the abdomen or pelvis.  05/03/2017 Initial Diagnosis    Pancreatic adenocarcinoma ([place])      05/13/2017 Imaging    CT Chest WO Contrast 05/13/17 IMPRESSION: No definite evidence of intrathoracic metastatic disease. Old granulomatous disease. Aortic Atherosclerosis        05/19/2017 Pathology  Results    Diagnosis 05/19/17 Pancreas, biopsy - ADENOCARCINOMA.  MMR was normal       11/02/2017 Imaging    CT AP W Contrast  IMPRESSION: 1. Interval increase in size of ill-defined pancreatic head mass consistent with known adenocarcinoma. 2. Interval development of a small perigastric lymph node. Continued close attention on follow-up recommended. 3. Slight increase and mass-effect on the portal splenic confluence by the pancreatic head mass although no vascular complication evident on the current study.      12/14/2017 Imaging    CT AP W Contrast  IMPRESSION: 1.  No acute intra-abdominal process. 2. Relatively unchanged ill-defined pancreatic head mass, consistent with adenocarcinoma. 3.  Prominent stool throughout the colon favors constipation.     CURRENT THERAPY: Supportive care    INTERVAL HISTORY: Summer Jones returns for f/u as scheduled. She continues f/u with PCP Dr. Joylene Draft. Her appetite is decreased. Generalized abdominal pain is gradually increasing, but overall still managed. She takes 200 mg ibuprofen daily, but was cautioned by PCP to avoid NSAIDs. Tylenol does not work as well. She has taken tramadol rarely, maybe 5 times, and is afraid of constipation. She takes creon 1 tab BID. Has normal soft BM daily, occasionally will have more frequent stool. Takes miralax daily. She had abdominal cramping with mag citrate. She has no new concerns, no fever, chills, cough, chest pain, or dyspnea.   REVIEW OF SYSTEMS:   Constitutional: Denies fevers, chills (+) weight loss (+) decreased appetite  Respiratory: Denies cough, dyspnea or wheezes Cardiovascular: Denies palpitation, chest discomfort or lower extremity swelling Gastrointestinal:  Denies nausea, vomiting, diarrhea, heartburn or change in bowel habits (+) constipation (+) generalized abdominal pain   Lymphatics: Denies new lymphadenopathy or easy bruising Behavioral/Psych: Mood is stable, no new changes  All other  systems were reviewed with the patient and are negative.  MEDICAL HISTORY:  Past Medical History:  Diagnosis Date  . Arthritis   . Common bile duct (CBD) obstruction   . Difficult intubation Anterior glottis, short TMD, limited mouth opening. Required glidescope and bougie stylet with multiple attempts.  . Diverticular disease   . Diverticulosis   . Essential hypertension   . Hypertension   . Hypothyroidism   . Jaundice   . Mitral valve prolapse   . Thyroid disease     SURGICAL HISTORY: Past Surgical History:  Procedure Laterality Date  . BREAST BIOPSY Right   . BREAST CYST EXCISION Right   . ENDOSCOPIC RETROGRADE CHOLANGIOPANCREATOGRAPHY (ERCP) WITH PROPOFOL N/A 08/08/2015   Procedure: ENDOSCOPIC RETROGRADE CHOLANGIOPANCREATOGRAPHY (ERCP) WITH PROPOFOL;  Surgeon: [name], MD;  Location: [facility];  Service: Endoscopy;  Laterality: N/A;  . 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 ESOPHAGOGASTRODUODENOSCOPY (EGD) WITH PROPOFOL N/A 11/25/2016   Procedure: ESOPHAGOGASTRODUODENOSCOPY (EGD) WITH PROPOFOL;  Surgeon: [name], MD;  Location: [facility];  Service: Endoscopy;  Laterality: N/A;  . EUS N/A 08/08/2015   Procedure: UPPER ENDOSCOPIC ULTRASOUND (EUS) RADIAL;  Surgeon: [name], MD;  Location: [facility];  Service: Endoscopy;  Laterality: N/A;  . INGUINAL HERNIA REPAIR     right  . LAPAROTOMY N/A 11/27/2016   Procedure: GASTROJEJUNOSTOMY;  Surgeon: Leighton Ruff, MD;  Location: [facility];  Service: General;  Laterality: N/A;  . PANCREAS BIOPSY  11/27/2016   Procedure: PANCREACTIC BIOPSY;  Surgeon: Leighton Ruff, MD;  Location: [facility];  Service: General;;  . Bess Kinds CHOLANGIOSCOPY N/A 12/26/2015    Procedure: BULAGTXM CHOLANGIOSCOPY;  Surgeon: [name], MD;  Location: [facility];  Service: Endoscopy;  Laterality: N/A;  . TONSILLECTOMY      I have reviewed the social history and family history with the patient and they are unchanged from previous note.  ALLERGIES:  is allergic to sulfamethoxazole.  MEDICATIONS:  Current Outpatient Medications  Medication Sig Dispense Refill  . amLODipine (NORVASC) 10 MG tablet Take 10 mg by mouth at bedtime.     . Cholecalciferol (VITAMIN D3) 400 units CAPS Take 2 capsules by mouth daily.    Marland Kitchen levothyroxine (SYNTHROID, LEVOTHROID) 25 MCG tablet Take 25 mcg by mouth daily before breakfast.    . lipase/protease/amylase (CREON) 12000 units CPEP capsule Take 2 capsules (24,000 Units total) by mouth 3 (three) times daily before meals. (Patient taking differently: Take 24,000 Units by mouth 2 (two) times daily. ) 540 capsule 1  . lisinopril (PRINIVIL,ZESTRIL) 5 MG tablet Take 5 mg by mouth at bedtime.     . Multiple Vitamins-Minerals (MULTIVITAMIN GUMMIES ADULT) CHEW Chew 1 tablet by mouth daily.    . polyethylene glycol powder (GLYCOLAX/MIRALAX) powder Take 17 g by mouth daily as needed for mild constipation.     . polyvinyl alcohol (LIQUIFILM TEARS) 1.4 % ophthalmic solution Place 1 drop into both eyes daily as needed for dry eyes.    . hyoscyamine (LEVSIN, ANASPAZ) 0.125 MG tablet Take 1 tablet (0.125 mg total) by mouth 3 (three) times daily as needed. (Patient not taking: Reported on 03/15/2018) 90 tablet 1  . traMADol (ULTRAM) 50 MG tablet Take 1 tablet (50 mg total) by mouth every 6 (six) hours as needed. (Patient not taking: Reported on 03/15/2018) 30 tablet 1   No current facility-administered medications for this visit.     PHYSICAL EXAMINATION: ECOG PERFORMANCE STATUS: 1 - Symptomatic but completely ambulatory  Vitals:   03/15/18 1456  BP: 128/61  Pulse: (!) 59  Resp: 17  Temp: 98.1 F (36.7 C)  SpO2: 100%   Filed Weights    03/15/18 1456  Weight: 80 lb 4.8 oz (36.4 kg)    GENERAL:alert, no distress and comfortable SKIN: skin color, texture, turgor are normal EYES: sclera anicteric  OROPHARYNX:no thrush or ulcers   LYMPH:  no palpable cervical or supraclavicular lymphadenopathy LUNGS: clear to auscultation with normal breathing effort HEART: regular rate & rhythm and no murmurs and no lower extremity edema ABDOMEN:abdomen soft with normal bowel sounds, generalized tenderness. 2-3 cm nodule to the right upper edge of abdominal incision, slightly tender   Musculoskeletal:no cyanosis of digits and no clubbing  NEURO: alert & oriented x 3 with fluent speech, no focal motor/sensory deficits  LABORATORY DATA:  I have reviewed the data as listed CBC Latest Ref Rng & Units 03/15/2018 12/14/2017 10/19/2017  WBC 3.9 - 10.3 K/uL 4.6 4.8 4.7  Hemoglobin 11.6 - 15.9 g/dL 11.8 12.2 12.1  Hematocrit 34.8 - 46.6 % 35.7 36.7 36.9  Platelets 145 - 400 K/uL 268 265 284     CMP Latest Ref Rng & Units 03/15/2018 12/14/2017 10/19/2017  Glucose 70 -  99 mg/dL 116(H) 127(H) 109  BUN 8 - 23 mg/dL '14 10 10  '$ Creatinine 0.44 - 1.00 mg/dL 0.69 0.55 0.71  Sodium 135 - 145 mmol/L 134(L) 139 134(L)  Potassium 3.5 - 5.1 mmol/L 4.0 3.8 4.8  Chloride 98 - 111 mmol/L 98 106 99  CO2 22 - 32 mmol/L '29 23 25  '$ Calcium 8.9 - 10.3 mg/dL 9.9 9.5 9.9  Total Protein 6.5 - 8.1 g/dL 7.5 7.3 7.3  Total Bilirubin 0.3 - 1.2 mg/dL 0.4 0.4 0.4  Alkaline Phos 38 - 126 U/L 187(H) 86 88  AST 15 - 41 U/L 38 24 25  ALT 0 - 44 U/L 37 20 20      RADIOGRAPHIC STUDIES: I have personally reviewed the radiological images as listed and agreed with the findings in the report. No results found.   ASSESSMENT & PLAN:  Summer Jones is a 84 y.o.  female with arthritis, HTN, thyroid disease, history of bilary duct stent, and pancreas mass suspicious for adenocarcinoma.   1. Pancreatic adenocarcinoma in head, cT2N0Mo, stage IB, MMR normal, untreated  2. Abdominal  pain, secondary to cancer and constipation  3. Chronic constipation  4. Hypertension, hypothyroidism   Summer Jones is doing well. She has persistent abdominal pain and constipation, stable with current symptom management. She will try to reduce NSAIDs and use more tylenol, and reserve tramadol for severe pain. Constipation is managed with miralax daily, she will use 1/2 dose on days with more frequent BM.   For weight loss, I discussed appetite stimulant medication such as mirtazapine, marinol, and megace and side effects. She declined today, but knows to call if she changes her mind. She will increase po intake to include small but frequent meals. She drinks 1-2 boost/ensure per day, I recommend she increase to 2 per day. OK for her to increase creon PRN.   Labs reviewed, CBC is unremarkable. Alk phos is elevated, AST/ALT and bili are normal. She has had ongoing fluctuations in LFTs for many years. I recommend to repeat in 2 months. CA 19-9 is pending. Will f/u.   PLAN -Labs reviewed -Continue supportive care  -CA 19-9 pending  -Lab, f/u in 2 months   All questions were answered. The patient knows to call the clinic with any problems, questions or concerns. No barriers to learning was detected. I spent 20 minutes counseling the patient face to face. The total time spent in the appointment was 25 minutes and more than 50% was on counseling and review of test results     Alla Feeling, NP 03/15/18

## 2018-03-15 NOTE — Telephone Encounter (Signed)
Scheduled appts per 7/30 los - pt did not want avs or calendar.

## 2018-03-16 LAB — CANCER ANTIGEN 19-9: CAN 19-9: 2349 U/mL — AB (ref 0–35)

## 2018-03-22 ENCOUNTER — Inpatient Hospital Stay: Payer: Medicare Other | Attending: Hematology | Admitting: Nurse Practitioner

## 2018-03-22 ENCOUNTER — Inpatient Hospital Stay: Payer: Medicare Other

## 2018-03-22 VITALS — BP 131/69 | HR 68 | Temp 97.8°F | Resp 18 | Ht 60.0 in | Wt 81.4 lb

## 2018-03-22 DIAGNOSIS — R748 Abnormal levels of other serum enzymes: Secondary | ICD-10-CM

## 2018-03-22 DIAGNOSIS — K625 Hemorrhage of anus and rectum: Secondary | ICD-10-CM | POA: Diagnosis not present

## 2018-03-22 DIAGNOSIS — K5909 Other constipation: Secondary | ICD-10-CM | POA: Diagnosis not present

## 2018-03-22 DIAGNOSIS — I10 Essential (primary) hypertension: Secondary | ICD-10-CM

## 2018-03-22 DIAGNOSIS — Z803 Family history of malignant neoplasm of breast: Secondary | ICD-10-CM | POA: Diagnosis not present

## 2018-03-22 DIAGNOSIS — C259 Malignant neoplasm of pancreas, unspecified: Secondary | ICD-10-CM

## 2018-03-22 DIAGNOSIS — Z79899 Other long term (current) drug therapy: Secondary | ICD-10-CM

## 2018-03-22 DIAGNOSIS — I700 Atherosclerosis of aorta: Secondary | ICD-10-CM | POA: Diagnosis not present

## 2018-03-22 DIAGNOSIS — E039 Hypothyroidism, unspecified: Secondary | ICD-10-CM | POA: Diagnosis not present

## 2018-03-22 DIAGNOSIS — I341 Nonrheumatic mitral (valve) prolapse: Secondary | ICD-10-CM | POA: Diagnosis not present

## 2018-03-22 DIAGNOSIS — R05 Cough: Secondary | ICD-10-CM | POA: Diagnosis not present

## 2018-03-22 DIAGNOSIS — C250 Malignant neoplasm of head of pancreas: Secondary | ICD-10-CM | POA: Diagnosis present

## 2018-03-22 DIAGNOSIS — M129 Arthropathy, unspecified: Secondary | ICD-10-CM

## 2018-03-22 DIAGNOSIS — K642 Third degree hemorrhoids: Secondary | ICD-10-CM

## 2018-03-22 DIAGNOSIS — R978 Other abnormal tumor markers: Secondary | ICD-10-CM | POA: Diagnosis not present

## 2018-03-22 DIAGNOSIS — I864 Gastric varices: Secondary | ICD-10-CM | POA: Insufficient documentation

## 2018-03-22 DIAGNOSIS — R109 Unspecified abdominal pain: Secondary | ICD-10-CM | POA: Diagnosis not present

## 2018-03-22 DIAGNOSIS — D490 Neoplasm of unspecified behavior of digestive system: Secondary | ICD-10-CM

## 2018-03-22 LAB — CBC WITH DIFFERENTIAL/PLATELET
Basophils Absolute: 0 10*3/uL (ref 0.0–0.1)
Basophils Relative: 1 %
Eosinophils Absolute: 0.1 10*3/uL (ref 0.0–0.5)
Eosinophils Relative: 2 %
HCT: 33.2 % — ABNORMAL LOW (ref 34.8–46.6)
HEMOGLOBIN: 10.9 g/dL — AB (ref 11.6–15.9)
LYMPHS ABS: 0.7 10*3/uL — AB (ref 0.9–3.3)
LYMPHS PCT: 14 %
MCH: 30 pg (ref 25.1–34.0)
MCHC: 32.8 g/dL (ref 31.5–36.0)
MCV: 91.5 fL (ref 79.5–101.0)
Monocytes Absolute: 0.5 10*3/uL (ref 0.1–0.9)
Monocytes Relative: 9 %
NEUTROS PCT: 74 %
Neutro Abs: 3.7 10*3/uL (ref 1.5–6.5)
Platelets: 249 10*3/uL (ref 145–400)
RBC: 3.63 MIL/uL — AB (ref 3.70–5.45)
RDW: 14.2 % (ref 11.2–14.5)
WBC: 5 10*3/uL (ref 3.9–10.3)

## 2018-03-22 LAB — COMPREHENSIVE METABOLIC PANEL
ALT: 38 U/L (ref 0–44)
AST: 39 U/L (ref 15–41)
Albumin: 4.2 g/dL (ref 3.5–5.0)
Alkaline Phosphatase: 229 U/L — ABNORMAL HIGH (ref 38–126)
Anion gap: 12 (ref 5–15)
BUN: 11 mg/dL (ref 8–23)
CHLORIDE: 100 mmol/L (ref 98–111)
CO2: 22 mmol/L (ref 22–32)
CREATININE: 0.61 mg/dL (ref 0.44–1.00)
Calcium: 9.5 mg/dL (ref 8.9–10.3)
Glucose, Bld: 124 mg/dL — ABNORMAL HIGH (ref 70–99)
POTASSIUM: 4 mmol/L (ref 3.5–5.1)
SODIUM: 134 mmol/L — AB (ref 135–145)
Total Bilirubin: 0.6 mg/dL (ref 0.3–1.2)
Total Protein: 7.4 g/dL (ref 6.5–8.1)

## 2018-03-22 MED ORDER — HYDROCORTISONE 2.5 % RE CREA: 1.0000 "application " | TOPICAL_CREAM | Freq: Two times a day (BID) | RECTAL | 0 refills | Status: AC | PRN

## 2018-03-22 NOTE — Progress Notes (Signed)
[facility]  [phone] [phone]  Clinic Follow up Note   Patient Care Team: Perini, Mark, MD as PCP - General (Internal Medicine) Jacobs, Daniel P, MD as Attending Physician (Gastroenterology) Feng, Yan, MD as Consulting Physician (Hematology) Date of Service 03/22/18   SUMMARY OF ONCOLOGIC HISTORY: Oncology History   Cancer Staging Pancreatic adenocarcinoma (HCC) Staging form: Exocrine Pancreas, AJCC 8th Edition - Clinical stage from 05/19/2017: Stage IB (cT2, cN0, cM0) - Signed by Feng, Yan, MD on 05/25/2017       Pancreatic adenocarcinoma (HCC)   08/06/2015 Imaging    CT ABD/PELVIS IMPRESSION: 1. Intra and extrahepatic biliary dilatation with an abrupt cut off of the common bile duct near its entry into the pancreatic head. Findings most suspicious for a biliary tumor (cholangiocarcinoma) but a common bile duct stone and pancreatic lesion are also possible. MRCP may be helpful for further evaluation if the patient can hold her breath. Otherwise, ERCP is suggested for both diagnostic and therapeutic purposes. 2. Moderate distention of the gallbladder without obvious gallstones. 3. No liver lesions or abdominal lymphadenopathy.       08/08/2015 Procedure    EUS and ERCP per Dr. Jacobs: indicated for painless juandice, dilated biliary tree  Very vaguely abnormal region in the pancreatic head. This was hypoechoic and SOMEWHAT masslike, measured 2cm Maximally.  2. CBD was dilated up to 13mm, tapered abruptly in the head of pancreas. 3. Main pancreatic duct was non-dilated 4. No peripancreatic adenopathy.      08/08/2015 Pathology Results    EUS path: Common Bile Duct , bx SMALL FRAGMENT OF BILE DUCT EPITHELIUM, PERIDUCTAL GLANDS AND FIBROMUSCULAR TISSUE NEGATIVE FOR MALIGNANCY      08/08/2015 Procedure    IR biliary stent placement       09/05/2015 Pathology Results    1. Soft tissue, biopsy, common bile duct - SCANT  SOFT TISSUE AND NECROSIS WITH ACUTE INFLAMMATION. - SCANT BENIGN GLANDULAR EPITHLEIUM. - NO MALIGNANCY IDENTIFIED. 2. Soft tissue, biopsy, common bile duct - SOFT TISSUE AND NECROSIS WITH ACUTE INFLAMMATION. - BENIGN PANCREATIC TISSUE. - NO MALIGNANCY IDENTIFIED.      09/05/2015 Imaging    CT ABD/PELVIS IMPRESSION: 1. No acute abnormality seen to explain the patient's symptoms. 2. Scattered pneumobilia is slightly more prominent, status post placement of a common bile duct stent. External biliary drainage catheter noted in expected position, adjacent to the upper aspect of the common bile duct stent. 3. Vague soft tissue edema about the pancreatic head and second segment of duodenum is relatively stable from the prior CT, and likely reflects the recent procedure. 4. Minimal diverticulosis at the cecum, without evidence of diverticulitis. 5. Mild degenerative change along the lumbar spine.       12/26/2015 Procedure    ERCP for stent replacement to bile duct stricture       12/26/2015 Pathology Results    BILE DUCT BRUSHING WITH STENT (SPECIMEN 1 OF 1, COLLECTED ON 12/26/15): NO MALIGNANT CELLS IDENTIFIED.      11/27/2016 Initial Biopsy    Pancreas, biopsy, mass Small focus of atypical glands, suspicious for infiltrating adenocarcinoma      11/27/2016 Procedure    POST-OPERATIVE DIAGNOSIS:  Gastric outlet obstruction  PROCEDURE:   GASTROJEJUNOSTOMY, BIOPSY PANCREATIC HEAD MASS       04/23/2017 Imaging    CT AP 04/23/17 IMPRESSION: 3.5 cm pancreatic head mass surrounding the common bile duct stent, consistent with pancreatic adenocarcinoma.  No evidence of metastatic disease within the abdomen   or pelvis.      05/03/2017 Initial Diagnosis    Pancreatic adenocarcinoma (HCC)      05/13/2017 Imaging    CT Chest WO Contrast 05/13/17 IMPRESSION: No definite evidence of intrathoracic metastatic disease. Old granulomatous disease. Aortic Atherosclerosis         05/19/2017 Pathology Results    Diagnosis 05/19/17 Pancreas, biopsy - ADENOCARCINOMA.  MMR was normal       11/02/2017 Imaging    CT AP W Contrast  IMPRESSION: 1. Interval increase in size of ill-defined pancreatic head mass consistent with known adenocarcinoma. 2. Interval development of a small perigastric lymph node. Continued close attention on follow-up recommended. 3. Slight increase and mass-effect on the portal splenic confluence by the pancreatic head mass although no vascular complication evident on the current study.      12/14/2017 Imaging    CT AP W Contrast  IMPRESSION: 1.  No acute intra-abdominal process. 2. Relatively unchanged ill-defined pancreatic head mass, consistent with adenocarcinoma. 3.  Prominent stool throughout the colon favors constipation.     CURRENT THERAPY: supportive care    INTERVAL HISTORY: Ms. Koppenhaver returns for unscheduled visit today. She does not feel well, has had "heavy" bright red rectal bleeding since she woke up this morning. Has had intermittent blood in stool for years with known internal hemorrhoid, would have small amount with BM once every few days. Today's amount and frequency are increased from her baseline. She has associated anal soreness and itching. Notes "spurting" blood that drips into the toilet. Having to wear pad/diaper. In some cases she notes only blood without BM. She takes miralax daily to manage chronic constipation. Abdominal pain is diffuse and now more intense, 7/10, and intermittently radiates to her back. She has been taking more tylenol but NSAIDs help more, she will take 200 mg before bed PRN. Appetite is decreased, denies nausea, vomiting, or diarrhea. Drinks boost and ensure alternating. Has dry cough related to lisinopril, no fever, chills, chest pain, or dyspnea.   REVIEW OF SYSTEMS:   Constitutional: Denies fevers, chills or abnormal weight loss (+) decreased appetite (+) does not feel well  Respiratory:  Denies dyspnea or wheezes (+) dry cough r/t lisinopril  Cardiovascular: Denies palpitation, chest discomfort or lower extremity swelling Gastrointestinal:  Denies nausea, vomiting, diarrhea, heartburn (+) miralax for constipation (+) BRBPR (+) diffuse abdominal pain radiating to back  Lymphatics: Denies new lymphadenopathy or easy bruising Neurological:Denies lightheadedness or dizziness or new weaknesses All other systems were reviewed with the patient and are negative.  MEDICAL HISTORY:  Past Medical History:  Diagnosis Date  . Arthritis   . Common bile duct (CBD) obstruction   . Difficult intubation Anterior glottis, short TMD, limited mouth opening. Required glidescope and bougie stylet with multiple attempts.  . Diverticular disease   . Diverticulosis   . Essential hypertension   . Hypertension   . Hypothyroidism   . Jaundice   . Mitral valve prolapse   . Thyroid disease     SURGICAL HISTORY: Past Surgical History:  Procedure Laterality Date  . BREAST BIOPSY Right   . BREAST CYST EXCISION Right   . ENDOSCOPIC RETROGRADE CHOLANGIOPANCREATOGRAPHY (ERCP) WITH PROPOFOL N/A 08/08/2015   Procedure: ENDOSCOPIC RETROGRADE CHOLANGIOPANCREATOGRAPHY (ERCP) WITH PROPOFOL;  Surgeon: Daniel P Jacobs, MD;  Location: [facility];  Service: Endoscopy;  Laterality: N/A;  . ENDOSCOPIC RETROGRADE CHOLANGIOPANCREATOGRAPHY (ERCP) WITH PROPOFOL N/A 12/26/2015   Procedure: ENDOSCOPIC RETROGRADE CHOLANGIOPANCREATOGRAPHY (ERCP) WITH PROPOFOL;  Surgeon: Daniel P Jacobs, MD;  Location: [facility];  Service: Endoscopy;  Laterality: N/A;  [employer] scientific rep needed  . ESOPHAGOGASTRODUODENOSCOPY (EGD) WITH PROPOFOL N/A 11/25/2016   Procedure: ESOPHAGOGASTRODUODENOSCOPY (EGD) WITH PROPOFOL;  Surgeon: Daniel P Jacobs, MD;  Location: [facility];  Service: Endoscopy;  Laterality: N/A;  . EUS N/A 08/08/2015   Procedure: UPPER ENDOSCOPIC ULTRASOUND (EUS) RADIAL;  Surgeon: Daniel P Jacobs, MD;   Location: [facility];  Service: Endoscopy;  Laterality: N/A;  . INGUINAL HERNIA REPAIR     right  . LAPAROTOMY N/A 11/27/2016   Procedure: GASTROJEJUNOSTOMY;  Surgeon: Alicia Thomas, MD;  Location: [facility];  Service: General;  Laterality: N/A;  . PANCREAS BIOPSY  11/27/2016   Procedure: PANCREACTIC BIOPSY;  Surgeon: Alicia Thomas, MD;  Location: [facility];  Service: General;;  . SPYGLASS CHOLANGIOSCOPY N/A 12/26/2015   Procedure: SPYGLASS CHOLANGIOSCOPY;  Surgeon: Daniel P Jacobs, MD;  Location: [facility];  Service: Endoscopy;  Laterality: N/A;  . TONSILLECTOMY      I have reviewed the social history and family history with the patient and they are unchanged from previous note.  ALLERGIES:  is allergic to sulfamethoxazole.  MEDICATIONS:  Current Outpatient Medications  Medication Sig Dispense Refill  . amLODipine (NORVASC) 10 MG tablet Take 10 mg by mouth at bedtime.     . Cholecalciferol (VITAMIN D3) 400 units CAPS Take 2 capsules by mouth daily.    . hydrocortisone (ANUSOL-HC) 2.5 % rectal cream Place 1 application rectally 2 (two) times daily as needed for hemorrhoids or anal itching. 30 g 0  . hyoscyamine (LEVSIN, ANASPAZ) 0.125 MG tablet Take 1 tablet (0.125 mg total) by mouth 3 (three) times daily as needed. (Patient not taking: Reported on 03/15/2018) 90 tablet 1  . levothyroxine (SYNTHROID, LEVOTHROID) 25 MCG tablet Take 25 mcg by mouth daily before breakfast.    . lipase/protease/amylase (CREON) 12000 units CPEP capsule Take 2 capsules (24,000 Units total) by mouth 3 (three) times daily before meals. (Patient taking differently: Take 24,000 Units by mouth 2 (two) times daily. ) 540 capsule 1  . lisinopril (PRINIVIL,ZESTRIL) 5 MG tablet Take 5 mg by mouth at bedtime.     . Multiple Vitamins-Minerals (MULTIVITAMIN GUMMIES ADULT) CHEW Chew 1 tablet by mouth daily.    . polyethylene glycol powder (GLYCOLAX/MIRALAX) powder Take 17 g by mouth daily as needed for mild constipation.     .  polyvinyl alcohol (LIQUIFILM TEARS) 1.4 % ophthalmic solution Place 1 drop into both eyes daily as needed for dry eyes.    . traMADol (ULTRAM) 50 MG tablet Take 1 tablet (50 mg total) by mouth every 6 (six) hours as needed. (Patient not taking: Reported on 03/15/2018) 30 tablet 1   No current facility-administered medications for this visit.     PHYSICAL EXAMINATION: ECOG PERFORMANCE STATUS: 2 - Symptomatic, <50% confined to bed  Vitals:   03/22/18 1638  BP: 131/69  Pulse: 68  Resp: 18  Temp: 97.8 F (36.6 C)  SpO2: 100%   Filed Weights   03/22/18 1638  Weight: 81 lb 6.4 oz (36.9 kg)    GENERAL:alert, no distress and comfortable SKIN: skin color is normal EYES:  sclera clear LYMPH:  no palpable cervical, supraclavicular, or axillary lymphadenopathy  LUNGS: clear to auscultation with normal breathing effort HEART: regular rate & rhythm, no lower extremity edema ABDOMEN:abdomen soft with normal bowel sounds; diffuse tenderness increased in epigastrium, subcutaneous nodule in that area as per previous exam  RECTAL: exam reveals external hemorrhoid with partially prolapsed internal hemorrhoid, grade   III-IV, reducible. No internal masses appreciated. Blood on glove  NEURO: alert & oriented x 3 with fluent speech, no focal motor deficits  LABORATORY DATA:  I have reviewed the data as listed CBC Latest Ref Rng & Units 03/22/2018 03/15/2018 12/14/2017  WBC 3.9 - 10.3 K/uL 5.0 4.6 4.8  Hemoglobin 11.6 - 15.9 g/dL 10.9(L) 11.8 12.2  Hematocrit 34.8 - 46.6 % 33.2(L) 35.7 36.7  Platelets 145 - 400 K/uL 249 268 265     CMP Latest Ref Rng & Units 03/22/2018 03/15/2018 12/14/2017  Glucose 70 - 99 mg/dL 124(H) 116(H) 127(H)  BUN 8 - 23 mg/dL 11 14 10  Creatinine 0.44 - 1.00 mg/dL 0.61 0.69 0.55  Sodium 135 - 145 mmol/L 134(L) 134(L) 139  Potassium 3.5 - 5.1 mmol/L 4.0 4.0 3.8  Chloride 98 - 111 mmol/L 100 98 106  CO2 22 - 32 mmol/L 22 29 23  Calcium 8.9 - 10.3 mg/dL 9.5 9.9 9.5  Total  Protein 6.5 - 8.1 g/dL 7.4 7.5 7.3  Total Bilirubin 0.3 - 1.2 mg/dL 0.6 0.4 0.4  Alkaline Phos 38 - 126 U/L 229(H) 187(H) 86  AST 15 - 41 U/L 39 38 24  ALT 0 - 44 U/L 38 37 20      RADIOGRAPHIC STUDIES: I have personally reviewed the radiological images as listed and agreed with the findings in the report. No results found.   ASSESSMENT & PLAN: 84 year old female with pancreatic adenocarcinoma on observation and supportive care, presents with 1 day history of persistent BRBPR  1. Rectal bleeding in hemorrhoids -last colonoscopy 2005 per Dr. Dora Brodie with divertisulosis, no polyps. -Ms. Worrel presents today with prolapsed hemorrhoid and 1 day history of persistent bright red blood per rectum in the setting of chronic intermittent rectal bleeding. She is hemodynamically stable. Hgb dropped from 11.8 to 10.9 in 1 week. I recommend treatment with topical annusol and symptom management with sitz bath. I sent Rx to her pharmacy. Due to rectal bleeding I referred her to Dr. Jacobs at [facility], who performed her most recent GI studies.    2. Adenocarcinoma of the pancreas  -Her disease has been more indolent, but she has worsening abdominal pain now diffuse and radiating to her back. CA 19-9 is markedly increased and trending up with elevated alk phos, concerning for disease progression.  -Pain management consists of NSAIDs, tylenol, and rarely tramadol. In the setting of rectal bleeding I recommend she avoid NSAIDs. She is hesitant to take opioids due to increased constipation. -She notes she "always thinks about treatment" but has preferred observation in the past. She admits her quality of life is not good right now, but is afraid on chemotherapy it will be worse. I reviewed palliative option for single agent gemcitabine and/or potentially palliative radiation as mentioned in the past. I reviewed chemotherapy treatment duration would depend on her tolerance and preference  -I suggest  repeating CT AP to evaluate increased pain, new bleeding, and establish new baseline as she considers treatment options. She agrees. I ordered today.   PLAN -Rx: annusol cream  -Symptom management with sitz baths  -Refer to Dr. Jacobs GI for bleeding hemorrhoid  -CT AP this week  -F/u in 1 month or sooner pending CT results    Orders Placed This Encounter  Procedures  . CT Abdomen Pelvis W Contrast    Standing Status:   Future    Standing Expiration Date:   03/22/2019    Order Specific Question:     If indicated for the ordered procedure, I authorize the administration of contrast media per Radiology protocol    Answer:   Yes    Order Specific Question:   Preferred imaging location?    Answer:   [facility]    Order Specific Question:   Is Oral Contrast requested for this exam?    Answer:   Yes, Per Radiology protocol    Order Specific Question:   Radiology Contrast Protocol - do NOT remove file path    Answer:   _[email or web address]  . Ambulatory referral to Gastroenterology    Referral Priority:   Routine    Referral Type:   Consultation    Referral Reason:   Specialty Services Required    Referred to Provider:   [name], MD    Number of Visits Requested:   1   All questions were answered. The patient knows to call the clinic with any problems, questions or concerns. No barriers to learning was detected. I spent 20 minutes counseling the patient face to face. The total time spent in the appointment was 25 minutes and more than 50% was on counseling and review of test results     Alla Feeling, NP 03/23/18

## 2018-03-23 ENCOUNTER — Encounter: Payer: Self-pay | Admitting: Nurse Practitioner

## 2018-03-23 ENCOUNTER — Telehealth: Payer: Self-pay | Admitting: Hematology

## 2018-03-23 ENCOUNTER — Telehealth: Payer: Self-pay | Admitting: Gastroenterology

## 2018-03-23 ENCOUNTER — Other Ambulatory Visit: Payer: Medicare Other

## 2018-03-23 ENCOUNTER — Ambulatory Visit: Payer: Medicare Other | Admitting: Nurse Practitioner

## 2018-03-23 LAB — CANCER ANTIGEN 19-9: CA 19-9: 2529 U/mL — ABNORMAL HIGH (ref 0–35)

## 2018-03-23 NOTE — Telephone Encounter (Signed)
Referral added to [facility] per 8/6 los

## 2018-03-23 NOTE — Telephone Encounter (Signed)
The pt has been scheduled for appt with Alonza Bogus for bleeding hemorrhoid and discuss banding.  She is aware

## 2018-03-25 ENCOUNTER — Other Ambulatory Visit: Payer: Medicare Other

## 2018-03-25 ENCOUNTER — Ambulatory Visit: Payer: Medicare Other | Admitting: Hematology

## 2018-03-28 ENCOUNTER — Ambulatory Visit: Payer: Medicare Other | Admitting: Hematology

## 2018-03-28 ENCOUNTER — Other Ambulatory Visit: Payer: Medicare Other

## 2018-03-29 ENCOUNTER — Ambulatory Visit (HOSPITAL_COMMUNITY)
Admission: RE | Admit: 2018-03-29 | Discharge: 2018-03-29 | Disposition: A | Discharge status: Home / Self Care | Payer: Medicare Other | Source: Ambulatory Visit | Attending: Nurse Practitioner | Admitting: Nurse Practitioner

## 2018-03-29 ENCOUNTER — Encounter (HOSPITAL_COMMUNITY): Payer: Self-pay | Admitting: Radiology

## 2018-03-29 DIAGNOSIS — I700 Atherosclerosis of aorta: Secondary | ICD-10-CM | POA: Diagnosis not present

## 2018-03-29 DIAGNOSIS — D490 Neoplasm of unspecified behavior of digestive system: Secondary | ICD-10-CM

## 2018-03-29 DIAGNOSIS — I864 Gastric varices: Secondary | ICD-10-CM | POA: Diagnosis not present

## 2018-03-29 MED ORDER — IOHEXOL 300 MG/ML  SOLN
100.0000 mL | Freq: Once | INTRAMUSCULAR | Status: AC | PRN
  Administered 2018-03-29: 80 mL via INTRAVENOUS

## 2018-04-05 ENCOUNTER — Telehealth: Payer: Self-pay | Admitting: Nurse Practitioner

## 2018-04-05 NOTE — Telephone Encounter (Signed)
I called patient to review CT results, which shows progression of the pancreatic head mass and peripancreatic adenopathy. No new metastasis. I again discussed to continue observation and symptom management vs treatment. At this point I think she would tolerate single agent chemotherapy. But I reviewed if her nutrition and performance status decline she may not be a good candidate for chemo. She is anxious about chemo side effects and wants to think more about it. Dr. Burr Medico previously mentioned palliative radiation as a potential option. She has GI appt on 8/22 for bleeding hemorrhoids. She will call after GI appt. Has f/u on 9/24, which can be moved up if she wants to come in to discuss her options. Patient verbalized understanding and appreciates the call.

## 2018-04-06 ENCOUNTER — Telehealth: Payer: Self-pay

## 2018-04-06 NOTE — Telephone Encounter (Signed)
Spoke with patient to let her know that someone from scheduling will be calling her with an appointment for one day next week, patient prefers afternoon appointment, scheduling message was sent.

## 2018-04-07 ENCOUNTER — Telehealth: Payer: Self-pay | Admitting: Nurse Practitioner

## 2018-04-07 ENCOUNTER — Ambulatory Visit (INDEPENDENT_AMBULATORY_CARE_PROVIDER_SITE_OTHER): Payer: Medicare Other | Admitting: Gastroenterology

## 2018-04-07 ENCOUNTER — Encounter: Payer: Self-pay | Admitting: Gastroenterology

## 2018-04-07 VITALS — BP 112/62 | HR 66 | Ht 59.5 in | Wt 80.0 lb

## 2018-04-07 DIAGNOSIS — K625 Hemorrhage of anus and rectum: Secondary | ICD-10-CM | POA: Diagnosis not present

## 2018-04-07 NOTE — Progress Notes (Signed)
04/07/2018 Summer Jones [id number] 1929/10/29   HISTORY OF PRESENT ILLNESS:  This is a pleasant 84 year old female who is a patient of Dr. [name].  She has pancreatic cancer and has not had any treatment.  She has an appointment next week to discuss treatment options, but she is unsure that she will undergo any treatment.  CA-19-9 level has been increasing.  She is continuing to lose weight.  She is here today to discuss her hemorrhoid bleeding.  She says that they will bleed for usually 2 to 4 days at a time and she will use hydrocortisone cream and Preparation H suppositories.  The bleeding will resolve and then she can go several weeks at a time without any further bleeding.  This has been occurring on and off for a year or 2.  She does take MiraLAX daily for her constipation and says that that is currently working well for that.   Past Medical History:  Diagnosis Date  . Arthritis   . Common bile duct (CBD) obstruction   . Difficult intubation Anterior glottis, short TMD, limited mouth opening. Required glidescope and bougie stylet with multiple attempts.  . Diverticular disease   . Diverticulosis   . Essential hypertension   . Hypertension   . Hypothyroidism   . Jaundice   . Mitral valve prolapse   . Pancreatic cancer ([place]) 05/2017  . Thyroid disease    Past Surgical History:  Procedure Laterality Date  . BREAST BIOPSY Right   . BREAST CYST EXCISION Right   . ENDOSCOPIC RETROGRADE CHOLANGIOPANCREATOGRAPHY (ERCP) WITH PROPOFOL N/A 08/08/2015   Procedure: ENDOSCOPIC RETROGRADE CHOLANGIOPANCREATOGRAPHY (ERCP) WITH PROPOFOL;  Surgeon: [name], MD;  [facility];  Service: Endoscopy;  Laterality: N/A;  . ENDOSCOPIC RETROGRADE CHOLANGIOPANCREATOGRAPHY (ERCP) WITH PROPOFOL N/A 12/26/2015   Procedure: ENDOSCOPIC RETROGRADE CHOLANGIOPANCREATOGRAPHY (ERCP) WITH PROPOFOL;  Surgeon: [name], MD;  [facility];  Service: Endoscopy;  Laterality: N/A;   [employer] scientific rep needed  . ESOPHAGOGASTRODUODENOSCOPY (EGD) WITH PROPOFOL N/A 11/25/2016   Procedure: ESOPHAGOGASTRODUODENOSCOPY (EGD) WITH PROPOFOL;  Surgeon: [name], MD;  [facility];  Service: Endoscopy;  Laterality: N/A;  . EUS N/A 08/08/2015   Procedure: UPPER ENDOSCOPIC ULTRASOUND (EUS) RADIAL;  Surgeon: [name], MD;  [facility];  Service: Endoscopy;  Laterality: N/A;  . INGUINAL HERNIA REPAIR     right  . LAPAROTOMY N/A 11/27/2016   Procedure: GASTROJEJUNOSTOMY;  Surgeon: Leighton Ruff, MD;  Location: [facility];  Service: General;  Laterality: N/A;  . PANCREAS BIOPSY  11/27/2016   Procedure: PANCREACTIC BIOPSY;  Surgeon: Leighton Ruff, MD;  Location: [facility];  Service: General;;  . Bess Kinds CHOLANGIOSCOPY N/A 12/26/2015   Procedure: PJKDTOIZ CHOLANGIOSCOPY;  Surgeon: [name], MD;  [facility]: [facility];  Service: Endoscopy;  Laterality: N/A;  . TONSILLECTOMY      reports that she quit smoking about 62 years ago. Her smoking use included cigarettes. She has a 10.00 pack-year smoking history. She has never used smokeless tobacco. She reports that she does not drink alcohol or use drugs. family history includes Breast cancer (age of onset: 37) in her sister; Heart disease in her father. Allergies  Allergen Reactions  . Sulfamethoxazole Nausea Only      Outpatient Encounter Medications as of 04/07/2018  Medication Sig  . acetaminophen (TYLENOL) 160 MG/5ML suspension Take by mouth as needed.  Marland Kitchen amLODipine (NORVASC) 10 MG tablet Take 10 mg by mouth at bedtime.   Marland Kitchen  Cholecalciferol (VITAMIN D3) 400 units CAPS Take 2 capsules by mouth daily.  . hydrocortisone (ANUSOL-HC) 2.5 % rectal cream Place 1 application rectally 2 (two) times daily as needed for hemorrhoids or anal itching.  . levothyroxine (SYNTHROID, LEVOTHROID) 25 MCG tablet Take 25 mcg by mouth daily before breakfast.  . lipase/protease/amylase (CREON) 12000 units CPEP  capsule Take 2 capsules (24,000 Units total) by mouth 3 (three) times daily before meals. (Patient taking differently: Take 24,000 Units by mouth 2 (two) times daily. )  . lisinopril (PRINIVIL,ZESTRIL) 5 MG tablet Take 5 mg by mouth at bedtime.   . Multiple Vitamins-Minerals (MULTIVITAMIN GUMMIES ADULT) CHEW Chew 1 tablet by mouth daily.  . polyethylene glycol powder (GLYCOLAX/MIRALAX) powder Take 17 g by mouth daily as needed for mild constipation.   . polyvinyl alcohol (LIQUIFILM TEARS) 1.4 % ophthalmic solution Place 1 drop into both eyes daily as needed for dry eyes.  . traMADol (ULTRAM) 50 MG tablet Take 1 tablet (50 mg total) by mouth every 6 (six) hours as needed.  . [DISCONTINUED] hyoscyamine (LEVSIN, ANASPAZ) 0.125 MG tablet Take 1 tablet (0.125 mg total) by mouth 3 (three) times daily as needed. (Patient not taking: Reported on 03/15/2018)   No facility-administered encounter medications on file as of 04/07/2018.      REVIEW OF SYSTEMS  : All other systems reviewed and negative except where noted in the History of Present Illness.   PHYSICAL EXAM: BP 112/62   Pulse 66   Ht 4' 11.5" (1.511 m)   Wt 80 lb (36.3 kg)   BMI 15.89 kg/m  General:  Thin and frail white female in no acute distress Head: Normocephalic and atraumatic Eyes:  Sclerae anicteric, conjunctiva pink. Ears: Normal auditory acuity Lungs: Clear throughout to auscultation; no increased WOB. Heart: Regular rate and rhythm; no M/R/G. Rectal:  Small amount of irritated external hemorrhoid tissue noted.  DRE did not reveal any masses. Musculoskeletal: Symmetrical with no gross deformities  Skin: No lesions on visible extremities Extremities: No edema  Neurological: Alert oriented x 4, grossly non-focal Psychological:  Alert and cooperative. Normal mood and affect  ASSESSMENT AND PLAN: *Internal hemorrhoids with intermittent bleeding:  Had conversation with patient regarding internal hemorrhoid banding.  Bleeding is  only occurring for a couple of days before resolving for weeks at a time.  I think that in light of her pancreatic cancer that we should just elect to treat these medically for now.  Will use her hydrocortisone cream on a Preparation H suppository at bedtime for a few days when bleeding occurs.  She is in agreement.     CC:  Crist Infante, MD

## 2018-04-07 NOTE — Progress Notes (Signed)
I agree with the above note, plan 

## 2018-04-07 NOTE — Patient Instructions (Addendum)
Use Hydrocortisone cream on a Preparation H suppository and insert into the rectum at bedtime when you are having rectal bleeding.

## 2018-04-07 NOTE — Telephone Encounter (Signed)
Spoke to patient regarding upcoming aug appts per 8/21 sch message

## 2018-04-11 ENCOUNTER — Telehealth: Payer: Self-pay | Admitting: Nurse Practitioner

## 2018-04-11 NOTE — Telephone Encounter (Signed)
Scheduled appt per 8/23 sch message. - pt is aware of appt date and time.

## 2018-04-12 ENCOUNTER — Inpatient Hospital Stay: Payer: Medicare Other | Admitting: Nurse Practitioner

## 2018-04-14 ENCOUNTER — Encounter: Payer: Self-pay | Admitting: Nurse Practitioner

## 2018-04-14 ENCOUNTER — Inpatient Hospital Stay (HOSPITAL_BASED_OUTPATIENT_CLINIC_OR_DEPARTMENT_OTHER): Payer: Medicare Other | Admitting: Nurse Practitioner

## 2018-04-14 VITALS — BP 141/76 | HR 65 | Temp 97.7°F | Resp 18 | Ht 59.5 in | Wt 80.8 lb

## 2018-04-14 DIAGNOSIS — I341 Nonrheumatic mitral (valve) prolapse: Secondary | ICD-10-CM

## 2018-04-14 DIAGNOSIS — M129 Arthropathy, unspecified: Secondary | ICD-10-CM

## 2018-04-14 DIAGNOSIS — I10 Essential (primary) hypertension: Secondary | ICD-10-CM

## 2018-04-14 DIAGNOSIS — R748 Abnormal levels of other serum enzymes: Secondary | ICD-10-CM | POA: Diagnosis not present

## 2018-04-14 DIAGNOSIS — C250 Malignant neoplasm of head of pancreas: Secondary | ICD-10-CM

## 2018-04-14 DIAGNOSIS — R978 Other abnormal tumor markers: Secondary | ICD-10-CM

## 2018-04-14 DIAGNOSIS — E039 Hypothyroidism, unspecified: Secondary | ICD-10-CM

## 2018-04-14 DIAGNOSIS — R05 Cough: Secondary | ICD-10-CM

## 2018-04-14 DIAGNOSIS — K625 Hemorrhage of anus and rectum: Secondary | ICD-10-CM | POA: Diagnosis not present

## 2018-04-14 DIAGNOSIS — R109 Unspecified abdominal pain: Secondary | ICD-10-CM

## 2018-04-14 DIAGNOSIS — K5909 Other constipation: Secondary | ICD-10-CM

## 2018-04-14 DIAGNOSIS — Z79899 Other long term (current) drug therapy: Secondary | ICD-10-CM

## 2018-04-14 DIAGNOSIS — K642 Third degree hemorrhoids: Secondary | ICD-10-CM | POA: Diagnosis not present

## 2018-04-14 DIAGNOSIS — I864 Gastric varices: Secondary | ICD-10-CM

## 2018-04-14 DIAGNOSIS — Z803 Family history of malignant neoplasm of breast: Secondary | ICD-10-CM

## 2018-04-14 DIAGNOSIS — I700 Atherosclerosis of aorta: Secondary | ICD-10-CM

## 2018-04-14 DIAGNOSIS — C259 Malignant neoplasm of pancreas, unspecified: Secondary | ICD-10-CM

## 2018-04-14 NOTE — Progress Notes (Addendum)
[place]  Telephone:([phone]) [phone] Fax:(336) [phone]  Clinic Follow up Note   Patient Care Team: Crist Infante, MD as PCP - General (Internal Medicine) [name], MD as Attending Physician (Gastroenterology) Truitt Merle, MD as Consulting Physician (Hematology) 04/14/2018  SUMMARY OF ONCOLOGIC HISTORY: Oncology History   Cancer Staging Pancreatic adenocarcinoma [facility]) Staging form: Exocrine Pancreas, AJCC 8th Edition - Clinical stage from 05/19/2017: Stage IB (cT2, cN0, cM0) - Signed by Truitt Merle, MD on 05/25/2017       Pancreatic adenocarcinoma ([place])   08/06/2015 Imaging    CT ABD/PELVIS IMPRESSION: 1. Intra and extrahepatic biliary dilatation with an abrupt cut off of the common bile duct near its entry into the pancreatic head. Findings most suspicious for a biliary tumor (cholangiocarcinoma) but a common bile duct stone and pancreatic lesion are also possible. MRCP may be helpful for further evaluation if the patient can hold her breath. Otherwise, ERCP is suggested for both diagnostic and therapeutic purposes. 2. Moderate distention of the gallbladder without obvious gallstones. 3. No liver lesions or abdominal lymphadenopathy.     08/08/2015 Procedure    EUS and ERCP per Dr. [name]: indicated for painless juandice, dilated biliary tree  Very vaguely abnormal region in the pancreatic head. This was hypoechoic and SOMEWHAT masslike, measured 2cm Maximally.  2. CBD was dilated up to 62m, tapered abruptly in the head of pancreas. 3. Main pancreatic duct was non-dilated 4. No peripancreatic adenopathy.    08/08/2015 Pathology Results    EUS path: Common Bile Duct , bx SMALL FRAGMENT OF BILE DUCT EPITHELIUM, PERIDUCTAL GLANDS AND FIBROMUSCULAR TISSUE NEGATIVE FOR MALIGNANCY    08/08/2015 Procedure    IR biliary stent placement     09/05/2015 Pathology Results    1. Soft tissue, biopsy, common bile duct - SCANT SOFT TISSUE AND NECROSIS WITH  ACUTE INFLAMMATION. - SCANT BENIGN GLANDULAR EPITHLEIUM. - NO MALIGNANCY IDENTIFIED. 2. Soft tissue, biopsy, common bile duct - SOFT TISSUE AND NECROSIS WITH ACUTE INFLAMMATION. - BENIGN PANCREATIC TISSUE. - NO MALIGNANCY IDENTIFIED.    09/05/2015 Imaging    CT ABD/PELVIS IMPRESSION: 1. No acute abnormality seen to explain the patient's symptoms. 2. Scattered pneumobilia is slightly more prominent, status post placement of a common bile duct stent. External biliary drainage catheter noted in expected position, adjacent to the upper aspect of the common bile duct stent. 3. Vague soft tissue edema about the pancreatic head and second segment of duodenum is relatively stable from the prior CT, and likely reflects the recent procedure. 4. Minimal diverticulosis at the cecum, without evidence of diverticulitis. 5. Mild degenerative change along the lumbar spine.     12/26/2015 Procedure    ERCP for stent replacement to bile duct stricture     12/26/2015 Pathology Results    BILE DUCT BRUSHING WITH STENT (SPECIMEN 1 OF 1, COLLECTED ON 12/26/15): NO MALIGNANT CELLS IDENTIFIED.    11/27/2016 Initial Biopsy    Pancreas, biopsy, mass Small focus of atypical glands, suspicious for infiltrating adenocarcinoma    11/27/2016 Procedure    POST-OPERATIVE DIAGNOSIS:  Gastric outlet obstruction  PROCEDURE:   GASTROJEJUNOSTOMY, BIOPSY PANCREATIC HEAD MASS     04/23/2017 Imaging    CT AP 04/23/17 IMPRESSION: 3.5 cm pancreatic head mass surrounding the common bile duct stent, consistent with pancreatic adenocarcinoma.  No evidence of metastatic disease within the abdomen or pelvis.    05/03/2017 Initial Diagnosis    Pancreatic adenocarcinoma ([place]    05/13/2017 Imaging    CT Chest  WO Contrast 05/13/17 IMPRESSION: No definite evidence of intrathoracic metastatic disease. Old granulomatous disease. Aortic Atherosclerosis      05/19/2017 Pathology Results    Diagnosis 05/19/17 Pancreas,  biopsy - ADENOCARCINOMA.  MMR was normal     11/02/2017 Imaging    CT AP W Contrast  IMPRESSION: 1. Interval increase in size of ill-defined pancreatic head mass consistent with known adenocarcinoma. 2. Interval development of a small perigastric lymph node. Continued close attention on follow-up recommended. 3. Slight increase and mass-effect on the portal splenic confluence by the pancreatic head mass although no vascular complication evident on the current study.    12/14/2017 Imaging    CT AP W Contrast  IMPRESSION: 1.  No acute intra-abdominal process. 2. Relatively unchanged ill-defined pancreatic head mass, consistent with adenocarcinoma. 3.  Prominent stool throughout the colon favors constipation.    03/29/2018 Imaging    IMPRESSION: 1. Masslike soft tissue in the region of the pancreatic head is difficult to measure but subjectively, appears larger. This is supported by increasing biliary ductal, gallbladder and pancreatic ductal dilatation, as well as enlarging peripancreatic adenopathy. Associated vascular encasement and narrowing of the portal vein with gastric varices. 2.  Aortic atherosclerosis (ICD10-170.0).     CURRENT THERAPY: supportive care           INTERVAL HISTORY: Summer Jones returns with her son and husband for discuss treatment options. She was last seen 03/22/18. She denies significant change in her overall condition since then. While she does rest much of the day, she is active at home doing light house work and does leave the house occasionally. Continues to have abdominal pain that has gradually worsened over time. Pain is worse at night, tolerable during the day. Takes tylenol and occasional ibuprofen to help her get comfortable to sleep. Bowel movements are normal on miralax. No recent blood in stool. She is using anusol with suppositories per GI. She saw Alonza Bogus, PA who did not recommend surgical intervention for bleeding hemorrhoid at this time.  She has chronic cough from lisinopril. No recent fever, chills, chest pain, dyspnea, nausea, vomiting, or diarrhea.    MEDICAL HISTORY:  Past Medical History:  Diagnosis Date  . Arthritis   . Common bile duct (CBD) obstruction   . Difficult intubation Anterior glottis, short TMD, limited mouth opening. Required glidescope and bougie stylet with multiple attempts.  . Diverticular disease   . Diverticulosis   . Essential hypertension   . Hypertension   . Hypothyroidism   . Jaundice   . Mitral valve prolapse   . Pancreatic cancer ([place]) 05/2017  . Thyroid disease     SURGICAL HISTORY: Past Surgical History:  Procedure Laterality Date  . BREAST BIOPSY Right   . BREAST CYST EXCISION Right   . ENDOSCOPIC RETROGRADE CHOLANGIOPANCREATOGRAPHY (ERCP) WITH PROPOFOL N/A 08/08/2015   Procedure: ENDOSCOPIC RETROGRADE CHOLANGIOPANCREATOGRAPHY (ERCP) WITH PROPOFOL;  Surgeon: [name], MD;  Location: [facility] END[facility];  Service: Endoscopy;  Laterality: N/A;  . 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 ESOPHAGOGASTRODUODENOSCOPY (EGD) WITH PROPOFOL N/A 11/25/2016   Procedure: ESOPHAGOGASTRODUODENOSCOPY (EGD) WITH PROPOFOL;  Surgeon: [name], MD;  Location: [facility];  Service: Endoscopy;  Laterality: N/A;  . EUS N/A 08/08/2015   Procedure: UPPER ENDOSCOPIC ULTRASOUND (EUS) RADIAL;  Surgeon: Melene Plan  [name], MD;  Location: [facility];  Service: Endoscopy;  Laterality: N/A;  . INGUINAL HERNIA REPAIR     right  . LAPAROTOMY N/A 11/27/2016   Procedure: GASTROJEJUNOSTOMY;  Surgeon: Leighton Ruff, MD;  Location: [facility];  Service: General;  Laterality: N/A;  . PANCREAS BIOPSY  11/27/2016   Procedure: PANCREACTIC BIOPSY;  Surgeon: Leighton Ruff, MD;  Location: [facility];  Service: General;;  . Bess Kinds CHOLANGIOSCOPY N/A 12/26/2015   Procedure: [id number] CHOLANGIOSCOPY;  Surgeon: [name], MD;  Location: [facility];  Service: Endoscopy;  Laterality: N/A;  . TONSILLECTOMY      I have reviewed the social history and family history with the patient and they are unchanged from previous note.  ALLERGIES:  is allergic to sulfamethoxazole.  MEDICATIONS:  Current Outpatient Medications  Medication Sig Dispense Refill  . acetaminophen (TYLENOL) 160 MG/5ML suspension Take by mouth as needed.    Marland Kitchen amLODipine (NORVASC) 10 MG tablet Take 10 mg by mouth at bedtime.     . Cholecalciferol (VITAMIN D3) 400 units CAPS Take 2 capsules by mouth daily.    . hydrocortisone (ANUSOL-HC) 2.5 % rectal cream Place 1 application rectally 2 (two) times daily as needed for hemorrhoids or anal itching. 30 g 0  . levothyroxine (SYNTHROID, LEVOTHROID) 25 MCG tablet Take 25 mcg by mouth daily before breakfast.    . lipase/protease/amylase (CREON) 12000 units CPEP capsule Take 2 capsules (24,000 Units total) by mouth 3 (three) times daily before meals. (Patient taking differently: Take 24,000 Units by mouth 2 (two) times daily. ) 540 capsule 1  . lisinopril (PRINIVIL,ZESTRIL) 5 MG tablet Take 5 mg by mouth at bedtime.     . Multiple Vitamins-Minerals (MULTIVITAMIN GUMMIES ADULT) CHEW Chew 1 tablet by mouth daily.    . polyethylene glycol powder (GLYCOLAX/MIRALAX) powder Take 17 g by mouth daily as needed for mild constipation.     . polyvinyl alcohol (LIQUIFILM TEARS) 1.4 % ophthalmic solution Place 1 drop into both eyes daily as needed for dry eyes.    . traMADol (ULTRAM) 50 MG tablet Take 1 tablet (50 mg total) by mouth every 6 (six) hours as needed. (Patient not taking: Reported on 04/14/2018) 30 tablet 1   No current facility-administered medications for this visit.     PHYSICAL EXAMINATION: ECOG PERFORMANCE STATUS: 2 - Symptomatic, <50% confined  to bed  Vitals:   04/14/18 1431  BP: (!) 141/76  Pulse: 65  Resp: 18  Temp: 97.7 F (36.5 C)  SpO2: 100%   Filed Weights   04/14/18 1431  Weight: 80 lb 12.8 oz (36.7 kg)    GENERAL:alert, no distress and comfortable SKIN: skin color, texture, turgor are normal, no rashes or significant lesions EYES:  sclera clear LYMPH:  no palpable lymphadenopathy in the cervical, axillary or inguinal LUNGS: clear to auscultation with normal breathing effort HEART: regular rate & rhythm, no lower extremity edema ABDOMEN:abdomen soft, normal bowel sounds. Mild diffuse tenderness. Subcutaneous nodule near surgical incision as per previous exam  Musculoskeletal:no cyanosis of digits and no clubbing  NEURO: alert & oriented x 3 with fluent speech, no focal motor/sensory deficits  LABORATORY DATA:  I have reviewed the data as listed CBC Latest Ref Rng & Units 03/22/2018 03/15/2018 12/14/2017  WBC 3.9 - 10.3 K/uL 5.0 4.6 4.8  Hemoglobin 11.6 - 15.9 g/dL 10.9(L) 11.8 12.2  Hematocrit 34.8 - 46.6 % 33.2(L) 35.7 36.7  Platelets 145 - 400 K/uL 249 268 265     CMP Latest Ref  Rng & Units 03/22/2018 03/15/2018 12/14/2017  Glucose 70 - 99 mg/dL 124(H) 116(H) 127(H)  BUN 8 - 23 mg/dL '11 14 10  '$ Creatinine 0.44 - 1.00 mg/dL 0.61 0.69 0.55  Sodium 135 - 145 mmol/L 134(L) 134(L) 139  Potassium 3.5 - 5.1 mmol/L 4.0 4.0 3.8  Chloride 98 - 111 mmol/L 100 98 106  CO2 22 - 32 mmol/L '22 29 23  '$ Calcium 8.9 - 10.3 mg/dL 9.5 9.9 9.5  Total Protein 6.5 - 8.1 g/dL 7.4 7.5 7.3  Total Bilirubin 0.3 - 1.2 mg/dL 0.6 0.4 0.4  Alkaline Phos 38 - 126 U/L 229(H) 187(H) 86  AST 15 - 41 U/L 39 38 24  ALT 0 - 44 U/L 38 37 20      RADIOGRAPHIC STUDIES: I have personally reviewed the radiological images as listed and agreed with the findings in the report. No results found.   ASSESSMENT & PLAN: 84 year old female with pancreatic adenocarcinoma on observation and supportive care, presents with 1 day history of persistent  BRBPR  1. Adenocarcinoma of the pancreas  -Ms. Enrique appears unchanged. She has progressive abdominal pain, likely related to her pancreatic cancer. She manages pain with tylenol and NSAIDs. She has tramadol which she is hesitant to take due to constipation. She can take 1/2 tab PRN and maintain bowel regimen.  -We reviewed treatment options; her goal is quality of life - Dr. Burr Medico discussed possible treatment options. She is not a good candidate for chemotherapy given her age. There is potential role for palliative radiation, the patient agrees to referral. Will arrange for her to see Dr. Lisbeth Renshaw.  -She has family history of breast cancer in her sister in her 38's. Due to family history and personal history of pancreas cancer, she qualifies for genetic testing and agrees to referral. -Will check for genetic mutation such as BRCA to see if she would qualify for PARP inhibitor.  -She prefers to keep her previously scheduled appt in 1 month, we will f/u with genetic test results as they become available.  2. Rectal bleeding in hemorrhoids -last colonoscopy 2005 per Dr. Delfin Edis with divertisulosis, no polyps. -I previously recommended topical annusol and symptom management with sitz bath  -She was recently seen by GI who does not recommend surgical intervention at this time -No recent bleeding   PLAN -Refer to Dr. Lisbeth Renshaw and genetic counselor  -lab, f/u in 1 month -f/u with genetic testing results    Orders Placed This Encounter  Procedures  . Ambulatory referral to Genetics    Referral Priority:   Routine    Referral Type:   Consultation    Referral Reason:   Specialty Services Required    Number of Visits Requested:   1  . Ambulatory referral to Radiation Oncology    Referral Priority:   Routine    Referral Type:   Consultation    Referral Reason:   Specialty Services Required    Referred to Provider:   Kyung Rudd, MD    Requested Specialty:   Radiation Oncology    Number of  Visits Requested:   1   All questions were answered. The patient knows to call the clinic with any problems, questions or concerns. No barriers to learning was detected. I spent 20 minutes counseling the patient face to face. The total time spent in the appointment was 25 minutes and more than 50% was on counseling, review of test results, and coordination of care.  Alla Feeling, NP 04/14/18   Addendum  I have seen the patient, examined her. I agree with the assessment and and plan and have edited the notes.   Mrs Klingel has worsening abdominal pain, is probably related to her pancreatic cancer. We reviewed her recent CT scan which showed enlarged pancreatic mass, no evidence of distant metastasis.  Due to her worsening symptoms, I recommend her to consider palliative radiation, the benefits and potential side effects were discussed with patient and her family members, she agrees with the referral to Dr. Lisbeth Renshaw for the discussion.  She does have a family history of breast cancer, I will refer her to genetics for genetic testing, to see if she has BRCA1 or BRCA2 mutation.  If positive, I would consider PARP inhibitor for disease control.  Due to her advanced age, I would not recommend cytotoxic systemic chemotherapy, and she is not interested in chemo also.  Truitt Merle  04/14/2018

## 2018-04-15 ENCOUNTER — Encounter: Payer: Self-pay | Admitting: Radiation Oncology

## 2018-04-15 ENCOUNTER — Telehealth: Payer: Self-pay | Admitting: Hematology

## 2018-04-15 NOTE — Telephone Encounter (Signed)
Scheduled appt per 8/29 sch message - pt is aware of appt date and time.   

## 2018-04-27 NOTE — Progress Notes (Signed)
GI Location of Tumor / Histology: Pancreatic Adenocarcinoma  Summer Jones presents with progressive abdominal pain, likely related to her pancreatic cancer.  CT AP 03/29/2018: Mass-like soft tissue in the region of the pancreatic head is difficult to measure but subjectively, appears larger.  This is supported by increasing biliary ductal, gallbladder and pancreatic ductal dilatation, as well as enlarging peripancreatic adenopathy.  CT AP 12/14/2017: No acute intra-abdominal process.  Relatively unchanged ill defined pancreatic head mass consistent with adenocarcinoma.  CT AP 11/02/2017: Interval increase in size of ill defined pancreatic head mass consistent with known adenocarcinoma, 3.6 x 4.4 cm.  Interval development of a 70m perigastric lymph node.  Slight increase and mass effect on the portal splenic confluence by pancreatic head mass although no vascular complication evident on the current study.  CT AP 04/23/2017: A hypo-vascular mass is now seen in the pancreatic head surrounding the common bile duct stent which measures 3.5 x 3.4 cm.  Biopsies of Pancreas 05/19/2017   Past/Anticipated interventions by surgeon, if any:  Dr. TMarcello Moores4/13/2018 - Gastrojejunostomy, biopsy pancreatic head mass.  Past/Anticipated interventions by medical oncology, if any:  NP BKalman Shan8/29/2019 -We reviewed treatment options, her goal is quality of life. -She is not a good candidate for chemotherapy given her age per Dr. FBurr Medico - There is potentially a role for palliative radiation, the patient agrees to the referral. -She has family history of breast cancer and personal history of pancreas cancer, she qualifies for genetic testing and agrees to referral. -Will check for genetic mutation such as BRCA to see if she would qualify for PARP inhibitor. -Follow-up in one month.  Weight changes, if any: Weight loss since last visit.  Bowel/Bladder complaints, if any: Constipation- taking miralax every  night.  Nausea / Vomiting, if any: No  Pain issues, if any:  7/10 pain in the abdomen  Any blood per rectum:  No   BP 133/76 (Patient Position: Sitting)   Pulse 70   Temp 97.9 F (36.6 C) (Oral)   Resp 18   Ht 4' 11.5" (1.511 m)   Wt 78 lb 12.8 oz (35.7 kg)   BMI 15.65 kg/m    Wt Readings from Last 3 Encounters:  04/28/18 78 lb 12.8 oz (35.7 kg)  04/14/18 80 lb 12.8 oz (36.7 kg)  04/07/18 80 lb (36.3 kg)    SAFETY ISSUES:  Prior radiation? No  Pacemaker/ICD? No  Possible current pregnancy? No  Is the patient on methotrexate? No  Current Complaints/Details: -Has hemorrhoids

## 2018-04-28 ENCOUNTER — Ambulatory Visit
Admission: RE | Admit: 2018-04-28 | Discharge: 2018-04-28 | Disposition: A | Discharge status: Home / Self Care | Payer: Medicare Other | Source: Ambulatory Visit | Attending: Radiation Oncology | Admitting: Radiation Oncology

## 2018-04-28 ENCOUNTER — Other Ambulatory Visit: Payer: Self-pay

## 2018-04-28 ENCOUNTER — Encounter: Payer: Self-pay | Admitting: Radiation Oncology

## 2018-04-28 VITALS — BP 133/76 | HR 70 | Temp 97.9°F | Resp 18 | Ht 59.5 in | Wt 78.8 lb

## 2018-04-28 DIAGNOSIS — I10 Essential (primary) hypertension: Secondary | ICD-10-CM | POA: Insufficient documentation

## 2018-04-28 DIAGNOSIS — C250 Malignant neoplasm of head of pancreas: Secondary | ICD-10-CM

## 2018-04-28 DIAGNOSIS — Z79890 Hormone replacement therapy (postmenopausal): Secondary | ICD-10-CM | POA: Diagnosis not present

## 2018-04-28 DIAGNOSIS — E039 Hypothyroidism, unspecified: Secondary | ICD-10-CM | POA: Insufficient documentation

## 2018-04-28 DIAGNOSIS — C259 Malignant neoplasm of pancreas, unspecified: Secondary | ICD-10-CM

## 2018-04-28 DIAGNOSIS — Z1379 Encounter for other screening for genetic and chromosomal anomalies: Secondary | ICD-10-CM

## 2018-04-28 DIAGNOSIS — K5900 Constipation, unspecified: Secondary | ICD-10-CM

## 2018-04-29 ENCOUNTER — Ambulatory Visit
Admission: RE | Admit: 2018-04-29 | Discharge: 2018-04-29 | Disposition: A | Discharge status: Home / Self Care | Payer: Medicare Other | Source: Ambulatory Visit | Attending: Radiation Oncology | Admitting: Radiation Oncology

## 2018-04-29 ENCOUNTER — Other Ambulatory Visit: Payer: Self-pay | Admitting: Radiation Oncology

## 2018-04-29 ENCOUNTER — Encounter: Payer: Self-pay | Admitting: General Practice

## 2018-04-29 DIAGNOSIS — Z510 Encounter for antineoplastic radiation therapy: Secondary | ICD-10-CM | POA: Diagnosis not present

## 2018-04-29 DIAGNOSIS — C250 Malignant neoplasm of head of pancreas: Secondary | ICD-10-CM

## 2018-04-29 MED ORDER — ONDANSETRON 8 MG PO TBDP: 8.0000 mg | ORAL_TABLET | Freq: Three times a day (TID) | ORAL | 0 refills | Status: DC | PRN

## 2018-04-29 NOTE — Progress Notes (Signed)
[place] Psychosocial Distress Screening Clinical Social Work  Clinical Social Work was referred by distress screening protocol.  The patient scored a 10 on the Psychosocial Distress Thermometer which indicates severe distress. Clinical Social Worker contacted patient by phone to assess for distress and other psychosocial needs. Distress level has decreased after meeting w treatment team and learning plan for addressing disease state.  Tries to keep mind focused on present and getting through today.  Strong support from husband of 68 years, children and adult grandchildren.  No concerns re transportation or help at home. Dealing w normal distress related to cancer diagnosis and upcoming treatment, good coping skills. CSW provided brief overview of services available through [facility], patient encouraged to connect as needed for support/resources.  ON[facility]N DISTRESS SCREENING 04/28/2018  Screening Type Initial Screening  Distress experienced in past week (1-10) 10  Emotional problem type Depression;Nervousness/Anxiety;Adjusting to illness  Physical Problem type Pain;Loss of appetitie;Constipation/diarrhea    Clinical Social Worker follow up needed: No.  If yes, follow up plan:  Beverely Pace, [place], LCSW Clinical Social Worker Phone:  [phone]

## 2018-04-29 NOTE — Progress Notes (Addendum)
Radiation Oncology         [phone] ________________________________  Name: Summer Jones        MRN: [id number]  Date of Service: 04/28/2018 DOB: [birth year]  IO:EVOJJK, Elta Guadeloupe, MD  Truitt Merle, MD     REFERRING PHYSICIAN: Truitt Merle, MD   DIAGNOSIS: The primary encounter diagnosis was Pancreatic adenocarcinoma [facility]). A diagnosis of Malignant neoplasm of head of pancreas [facility]) was also pertinent to this visit.   HISTORY OF PRESENT ILLNESS: Summer Jones is a 84 y.o. female seen at the request of Dr. Burr Medico for a history of adenocarcinoma of the pancreatic head. The patient developed symptoms in 2016 with imaging findings suggesting an abnormality in the CBD at the junction of the pancreatic head. She had multiple years of work up and was finally diagnosed in October 2018. She had had at least 3 prior inconclusive results from tissue sampling. Upon her confirming diagnosis, she decided she was not interested in any systemic therapy or surgical resection. She has had increasing epigastric pain that has been attributed to the pancreatic head tumor, and this has been imaged on multiple occasions and is ill defined by CT imaging. She has been supplementing pancreatic enzymes, and her CA 19-9 has continued to increase from 990 in March 2019, to 2349 in July 2019. She is still wanting to maintain quality of life over considering systemic therapy and comes today to consider palliative radiotherapy. Her last CT on 03/29/18 revealed the pancreatic head mass appeared larger, was difficult to measure, but showed increasing biliary, CBD, and pancreatic duct dilatation and enlarging peripancreatic adenopathy. She is also scheduled to meet with genetic counseling next month.    PREVIOUS RADIATION THERAPY: No   PAST MEDICAL HISTORY:  Past Medical History:  Diagnosis Date  . Arthritis   . Common bile duct (CBD) obstruction   . Difficult intubation Anterior glottis, short TMD, limited mouth opening. Required  glidescope and bougie stylet with multiple attempts.  . Diverticular disease   . Diverticulosis   . Essential hypertension   . Hypertension   . Hypothyroidism   . Jaundice   . Mitral valve prolapse   . Pancreatic cancer ([place]) 05/2017  . Thyroid disease        PAST SURGICAL HISTORY: Past Surgical History:  Procedure Laterality Date  . BREAST BIOPSY Right   . BREAST CYST EXCISION Right   . ENDOSCOPIC RETROGRADE CHOLANGIOPANCREATOGRAPHY (ERCP) WITH PROPOFOL N/A 08/08/2015   Procedure: ENDOSCOPIC RETROGRADE CHOLANGIOPANCREATOGRAPHY (ERCP) WITH PROPOFOL;  Surgeon: [name], MD;  [facility];  Service: Endoscopy;  Laterality: N/A;  . 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 ESOPHAGOGASTRODUODENOSCOPY (EGD) WITH PROPOFOL N/A 11/25/2016   Procedure: ESOPHAGOGASTRODUODENOSCOPY (EGD) WITH PROPOFOL;  Surgeon: [name], MD;  [facility];  Service: Endoscopy;  Laterality: N/A;  . EUS N/A 08/08/2015   Procedure: UPPER ENDOSCOPIC ULTRASOUND (EUS) RADIAL;  Surgeon: [name], MD;  [facility];  Service: Endoscopy;  Laterality: N/A;  . INGUINAL HERNIA REPAIR     right  . LAPAROTOMY N/A 11/27/2016   Procedure: GASTROJEJUNOSTOMY;  Surgeon: Leighton Ruff, MD;  [facility];  Service: General;  Laterality: N/A;  . PANCREAS BIOPSY  11/27/2016   Procedure: PANCREACTIC BIOPSY;  Surgeon: Leighton Ruff, MD;  Location: [facility];  Service: General;;  . Bess Kinds CHOLANGIOSCOPY N/A 12/26/2015   Procedure: YDXAJOIN CHOLANGIOSCOPY;  Surgeon: [name], MD;  [facility];  Service: Endoscopy;  Laterality: N/A;  . TONSILLECTOMY       FAMILY HISTORY:  Family History  Problem Relation Age  of Onset  . Breast cancer Sister 64       treated with radiation  . Heart disease Father   . Colon cancer Neg Hx      SOCIAL HISTORY:  reports that she quit smoking about 62 years ago. Her smoking use included cigarettes. She has a 10.00 pack-year smoking history. She has never used smokeless tobacco. She reports that she does not drink alcohol or use drugs.   ALLERGIES: Sulfamethoxazole   MEDICATIONS:  Current Outpatient Medications  Medication Sig Dispense Refill  . acetaminophen (TYLENOL) 160 MG/5ML suspension Take by mouth as needed.    Marland Kitchen amLODipine (NORVASC) 10 MG tablet Take 10 mg by mouth at bedtime.     . Cholecalciferol (VITAMIN D3) 400 units CAPS Take 2 capsules by mouth daily.    . hydrocortisone (ANUSOL-HC) 2.5 % rectal cream Place 1 application rectally 2 (two) times daily as needed for hemorrhoids or anal itching. 30 g 0  . levothyroxine (SYNTHROID, LEVOTHROID) 25 MCG tablet Take 25 mcg by mouth daily before breakfast.    . lipase/protease/amylase (CREON) 12000 units CPEP capsule Take 2 capsules (24,000 Units total) by mouth 3 (three) times daily before meals. (Patient taking differently: Take 24,000 Units by mouth 2 (two) times daily. ) 540 capsule 1  . lisinopril (PRINIVIL,ZESTRIL) 5 MG tablet Take 5 mg by mouth at bedtime.     . Multiple Vitamins-Minerals (MULTIVITAMIN GUMMIES ADULT) CHEW Chew 1 tablet by mouth daily.    . polyethylene glycol powder (GLYCOLAX/MIRALAX) powder Take 17 g by mouth daily as needed for mild constipation.     . polyvinyl alcohol (LIQUIFILM TEARS) 1.4 % ophthalmic solution Place 1 drop into both eyes daily as needed for dry eyes.    . sodium chloride (OCEAN) 0.65 % nasal spray as needed.    . traMADol (ULTRAM) 50 MG tablet Take 1 tablet (50 mg total) by mouth every 6 (six) hours as needed. (Patient not taking: Reported on 04/14/2018) 30 tablet 1   No current facility-administered medications for this encounter.      REVIEW OF SYSTEMS: On  review of systems, the patient reports that she is doing well overall. She reports she gets constipated easily and takes miralax every evening to keep her bowels moving. She takes tylenol or advil for her abdominal pain but has tramadol to try if needed. She is concerned to take this due to constipation. She denies any chest pain, shortness of breath, cough, fevers, chills, night sweats, unintended weight changes. She denies any bladder disturbances, and denies nausea or vomiting. She denies any new musculoskeletal or joint aches or pains. A complete review of systems is obtained and is otherwise negative.     PHYSICAL EXAM:  Wt Readings from Last 3 Encounters:  04/28/18 78 lb 12.8 oz (35.7 kg)  04/14/18 80 lb 12.8 oz (36.7 kg)  04/07/18 80 lb (36.3 kg)   Temp Readings from Last 3 Encounters:  04/28/18 97.9 F (36.6 C) (Oral)  04/14/18 97.7 F (36.5 C) (Oral)  03/22/18 97.8 F (36.6 C) (Oral)   BP Readings from Last 3 Encounters:  04/28/18 133/76  04/14/18 (!) 141/76  04/07/18 112/62   Pulse Readings from Last 3 Encounters:  04/28/18 70  04/14/18 65  04/07/18 66   Pain Assessment Pain Score: 6  Pain Frequency: Constant Pain Loc: Abdomen/10  In general this is a well appearing caucasian female who appears younger than her stated age in no acute distress. she is alert and oriented x4 and appropriate throughout the examination. HEENT reveals that the patient is normocephalic, atraumatic. EOMs are intact.  Skin is intact without any evidence of gross lesions. Cardiovascular exam reveals a regular rate and rhythm, no clicks rubs or murmurs are auscultated. Chest is clear to auscultation bilaterally. Lymphatic assessment is performed and does not reveal any adenopathy in the cervical, supraclavicular, axillary, or inguinal chains. Abdomen has active bowel sounds in all quadrants and is intact. The abdomen is soft, non tender, non distended. There is questionable fullness in the RUQ  concerning for possible abdominal wall laxity without hernia. Lower extremities are negative for pretibial pitting edema, deep calf tenderness, cyanosis or clubbing.   ECOG = 1  0 - Asymptomatic (Fully active, able to carry on all predisease activities without restriction)  1 - Symptomatic but completely ambulatory (Restricted in physically strenuous activity but ambulatory and able to carry out work of a light or sedentary nature. For example, light housework, office work)  2 - Symptomatic, <50% in bed during the day (Ambulatory and capable of all self care but unable to carry out any work activities. Up and about more than 50% of waking hours)  3 - Symptomatic, >50% in bed, but not bedbound (Capable of only limited self-care, confined to bed or chair 50% or more of waking hours)  4 - Bedbound (Completely disabled. Cannot carry on any self-care. Totally confined to bed or chair)  5 - Death   Eustace Pen MM, Creech RH, Tormey DC, et al. [phone]). "Toxicity and response criteria of the [facility]". [place] Oncol. 5 (6): 649-55    LABORATORY DATA:  Lab Results  Component Value Date   WBC 5.0 03/22/2018   HGB 10.9 (L) 03/22/2018   HCT 33.2 (L) 03/22/2018   MCV 91.5 03/22/2018   PLT 249 03/22/2018   Lab Results  Component Value Date   NA 134 (L) 03/22/2018   K 4.0 03/22/2018   CL 100 03/22/2018   CO2 22 03/22/2018   Lab Results  Component Value Date   ALT 38 03/22/2018   AST 39 03/22/2018   ALKPHOS 229 (H) 03/22/2018   BILITOT 0.6 03/22/2018      RADIOGRAPHY: No results found.     IMPRESSION/PLAN: 1. Locally advanced adenocarcinoma of the pancreas. Dr. Lisbeth Renshaw discusses the pathology findings and reviews the nature of pancreatic cancer and the difficulty in her course.She wishes to avoid systemic therapy but would consider palliative radiotherapy for symptom management. We discussed the risks, benefits, short, and long term effects of radiotherapy, and  the patient is interested in proceeding. Dr. Lisbeth Renshaw discusses the delivery and logistics of radiotherapy and anticipates a course of 2 weeks of radiotherapy to the pancreas. Written consent is obtained and placed in the chart, a copy was provided to the patient. She will return tomorrow for simulation.  2. Constipation. She will continue her current bowel regimen and add to this if she needs pain medication more frequently than she is using it. 3. Possible genetic predisposition to malignancy. The patient is a candidate for genetic testing given her personal history. She is scheduled to meet with genetics in a few weeks.  In a visit lasting 60 minutes, greater  than 50% of the time was spent face to face discussing her case, and coordinating the patient's care.  The above documentation reflects my direct findings during this shared patient visit. Please see the separate note by Dr. Lisbeth Renshaw on this date for the remainder of the patient's plan of care.    Carola Rhine, PAC

## 2018-04-29 NOTE — Addendum Note (Signed)
Encounter addended by: Kyung Rudd, MD on: 04/29/2018 9:53 AM  Actions taken: Pharmacy for encounter modified

## 2018-05-02 ENCOUNTER — Telehealth: Payer: Self-pay | Admitting: *Deleted

## 2018-05-02 ENCOUNTER — Other Ambulatory Visit: Payer: Self-pay | Admitting: Radiation Oncology

## 2018-05-02 MED ORDER — LORAZEPAM 0.5 MG PO TABS: 0.2500 mg | ORAL_TABLET | Freq: Four times a day (QID) | ORAL | 0 refills | Status: DC | PRN

## 2018-05-02 NOTE — Addendum Note (Signed)
Encounter addended by: Kyung Rudd, MD on: 05/02/2018 1:38 PM  Actions taken: Edit attestation on clinical note

## 2018-05-02 NOTE — Telephone Encounter (Signed)
Left Maire a voicemail letting her know that a prescription was sent to her pharmacy for Ativan.  Informed her that this medication should be taken instead of the zofran that was previously prescribed.  Left a call back number in the event that she has any questions.  Will continue to follow as necessary.  Gloriajean Dell. Leonie Green, BSN

## 2018-05-06 ENCOUNTER — Telehealth: Payer: Self-pay | Admitting: Hematology

## 2018-05-06 DIAGNOSIS — C250 Malignant neoplasm of head of pancreas: Secondary | ICD-10-CM | POA: Diagnosis not present

## 2018-05-06 NOTE — Telephone Encounter (Signed)
Appts r/s and patient notified per 9/20 sch msg

## 2018-05-08 ENCOUNTER — Encounter (HOSPITAL_COMMUNITY): Payer: Self-pay | Admitting: Emergency Medicine

## 2018-05-08 ENCOUNTER — Emergency Department (HOSPITAL_COMMUNITY): Payer: Medicare Other

## 2018-05-08 ENCOUNTER — Inpatient Hospital Stay (HOSPITAL_COMMUNITY)
Admission: EM | Admit: 2018-05-08 | Discharge: 2018-05-24 | DRG: 445 | Disposition: A | Discharge status: Skilled Nursing Facility | Payer: Medicare Other | Attending: Family Medicine | Admitting: Family Medicine

## 2018-05-08 DIAGNOSIS — D509 Iron deficiency anemia, unspecified: Secondary | ICD-10-CM | POA: Diagnosis present

## 2018-05-08 DIAGNOSIS — Z681 Body mass index (BMI) 19 or less, adult: Secondary | ICD-10-CM

## 2018-05-08 DIAGNOSIS — C250 Malignant neoplasm of head of pancreas: Secondary | ICD-10-CM

## 2018-05-08 DIAGNOSIS — F411 Generalized anxiety disorder: Secondary | ICD-10-CM | POA: Diagnosis present

## 2018-05-08 DIAGNOSIS — I248 Other forms of acute ischemic heart disease: Secondary | ICD-10-CM | POA: Diagnosis present

## 2018-05-08 DIAGNOSIS — C787 Secondary malignant neoplasm of liver and intrahepatic bile duct: Secondary | ICD-10-CM | POA: Diagnosis present

## 2018-05-08 DIAGNOSIS — Z8507 Personal history of malignant neoplasm of pancreas: Secondary | ICD-10-CM | POA: Diagnosis not present

## 2018-05-08 DIAGNOSIS — C259 Malignant neoplasm of pancreas, unspecified: Secondary | ICD-10-CM | POA: Diagnosis not present

## 2018-05-08 DIAGNOSIS — E871 Hypo-osmolality and hyponatremia: Secondary | ICD-10-CM | POA: Diagnosis present

## 2018-05-08 DIAGNOSIS — Z7189 Other specified counseling: Secondary | ICD-10-CM

## 2018-05-08 DIAGNOSIS — Z66 Do not resuscitate: Secondary | ICD-10-CM | POA: Diagnosis present

## 2018-05-08 DIAGNOSIS — K810 Acute cholecystitis: Principal | ICD-10-CM | POA: Diagnosis present

## 2018-05-08 DIAGNOSIS — Z79899 Other long term (current) drug therapy: Secondary | ICD-10-CM | POA: Diagnosis not present

## 2018-05-08 DIAGNOSIS — T447X5A Adverse effect of beta-adrenoreceptor antagonists, initial encounter: Secondary | ICD-10-CM | POA: Diagnosis not present

## 2018-05-08 DIAGNOSIS — I959 Hypotension, unspecified: Secondary | ICD-10-CM | POA: Diagnosis present

## 2018-05-08 DIAGNOSIS — R0600 Dyspnea, unspecified: Secondary | ICD-10-CM

## 2018-05-08 DIAGNOSIS — Z515 Encounter for palliative care: Secondary | ICD-10-CM | POA: Diagnosis not present

## 2018-05-08 DIAGNOSIS — R140 Abdominal distension (gaseous): Secondary | ICD-10-CM | POA: Diagnosis not present

## 2018-05-08 DIAGNOSIS — E440 Moderate protein-calorie malnutrition: Secondary | ICD-10-CM | POA: Diagnosis present

## 2018-05-08 DIAGNOSIS — E860 Dehydration: Secondary | ICD-10-CM | POA: Diagnosis present

## 2018-05-08 DIAGNOSIS — R1310 Dysphagia, unspecified: Secondary | ICD-10-CM | POA: Diagnosis present

## 2018-05-08 DIAGNOSIS — Z791 Long term (current) use of non-steroidal anti-inflammatories (NSAID): Secondary | ICD-10-CM

## 2018-05-08 DIAGNOSIS — R1011 Right upper quadrant pain: Secondary | ICD-10-CM

## 2018-05-08 DIAGNOSIS — F410 Panic disorder [episodic paroxysmal anxiety] without agoraphobia: Secondary | ICD-10-CM | POA: Diagnosis present

## 2018-05-08 DIAGNOSIS — I480 Paroxysmal atrial fibrillation: Secondary | ICD-10-CM | POA: Diagnosis not present

## 2018-05-08 DIAGNOSIS — R001 Bradycardia, unspecified: Secondary | ICD-10-CM | POA: Diagnosis not present

## 2018-05-08 DIAGNOSIS — E039 Hypothyroidism, unspecified: Secondary | ICD-10-CM | POA: Diagnosis present

## 2018-05-08 DIAGNOSIS — E875 Hyperkalemia: Secondary | ICD-10-CM | POA: Diagnosis not present

## 2018-05-08 DIAGNOSIS — K819 Cholecystitis, unspecified: Secondary | ICD-10-CM

## 2018-05-08 DIAGNOSIS — G893 Neoplasm related pain (acute) (chronic): Secondary | ICD-10-CM

## 2018-05-08 DIAGNOSIS — I10 Essential (primary) hypertension: Secondary | ICD-10-CM | POA: Diagnosis present

## 2018-05-08 DIAGNOSIS — E8770 Fluid overload, unspecified: Secondary | ICD-10-CM | POA: Diagnosis not present

## 2018-05-08 DIAGNOSIS — R109 Unspecified abdominal pain: Secondary | ICD-10-CM | POA: Diagnosis present

## 2018-05-08 DIAGNOSIS — Z79890 Hormone replacement therapy (postmenopausal): Secondary | ICD-10-CM

## 2018-05-08 DIAGNOSIS — Z87891 Personal history of nicotine dependence: Secondary | ICD-10-CM | POA: Diagnosis not present

## 2018-05-08 DIAGNOSIS — K5900 Constipation, unspecified: Secondary | ICD-10-CM | POA: Diagnosis present

## 2018-05-08 DIAGNOSIS — E876 Hypokalemia: Secondary | ICD-10-CM | POA: Diagnosis not present

## 2018-05-08 DIAGNOSIS — K649 Unspecified hemorrhoids: Secondary | ICD-10-CM | POA: Diagnosis present

## 2018-05-08 DIAGNOSIS — T461X5A Adverse effect of calcium-channel blockers, initial encounter: Secondary | ICD-10-CM | POA: Diagnosis not present

## 2018-05-08 LAB — CBC WITH DIFFERENTIAL/PLATELET
BASOS PCT: 0 %
Basophils Absolute: 0 10*3/uL (ref 0.0–0.1)
EOS ABS: 0 10*3/uL (ref 0.0–0.7)
Eosinophils Relative: 0 %
HCT: 28.9 % — ABNORMAL LOW (ref 36.0–46.0)
Hemoglobin: 9.9 g/dL — ABNORMAL LOW (ref 12.0–15.0)
Lymphocytes Relative: 3 %
Lymphs Abs: 0.2 10*3/uL — ABNORMAL LOW (ref 0.7–4.0)
MCH: 29.7 pg (ref 26.0–34.0)
MCHC: 34.3 g/dL (ref 30.0–36.0)
MCV: 86.8 fL (ref 78.0–100.0)
Monocytes Absolute: 0.4 10*3/uL (ref 0.1–1.0)
Monocytes Relative: 5 %
NEUTROS PCT: 92 %
Neutro Abs: 7.4 10*3/uL (ref 1.7–7.7)
Platelets: 301 10*3/uL (ref 150–400)
RBC: 3.33 MIL/uL — AB (ref 3.87–5.11)
RDW: 13.6 % (ref 11.5–15.5)
WBC: 8.1 10*3/uL (ref 4.0–10.5)

## 2018-05-08 LAB — URINALYSIS, ROUTINE W REFLEX MICROSCOPIC
BILIRUBIN URINE: NEGATIVE
GLUCOSE, UA: NEGATIVE mg/dL
HGB URINE DIPSTICK: NEGATIVE
Ketones, ur: NEGATIVE mg/dL
Leukocytes, UA: NEGATIVE
Nitrite: NEGATIVE
Protein, ur: NEGATIVE mg/dL
Specific Gravity, Urine: 1.005 (ref 1.005–1.030)
pH: 6 (ref 5.0–8.0)

## 2018-05-08 LAB — I-STAT CHEM 8, ED
BUN: 10 mg/dL (ref 8–23)
CALCIUM ION: 1.08 mmol/L — AB (ref 1.15–1.40)
CHLORIDE: 91 mmol/L — AB (ref 98–111)
Creatinine, Ser: 0.4 mg/dL — ABNORMAL LOW (ref 0.44–1.00)
Glucose, Bld: 111 mg/dL — ABNORMAL HIGH (ref 70–99)
HCT: 29 % — ABNORMAL LOW (ref 36.0–46.0)
Hemoglobin: 9.9 g/dL — ABNORMAL LOW (ref 12.0–15.0)
POTASSIUM: 4.2 mmol/L (ref 3.5–5.1)
SODIUM: 123 mmol/L — AB (ref 135–145)
TCO2: 23 mmol/L (ref 22–32)

## 2018-05-08 LAB — COMPREHENSIVE METABOLIC PANEL
ALK PHOS: 223 U/L — AB (ref 38–126)
ALT: 21 U/L (ref 0–44)
AST: 29 U/L (ref 15–41)
Albumin: 2.8 g/dL — ABNORMAL LOW (ref 3.5–5.0)
Anion gap: 11 (ref 5–15)
BILIRUBIN TOTAL: 1.4 mg/dL — AB (ref 0.3–1.2)
BUN: 12 mg/dL (ref 8–23)
CALCIUM: 8.5 mg/dL — AB (ref 8.9–10.3)
CO2: 23 mmol/L (ref 22–32)
CREATININE: 0.5 mg/dL (ref 0.44–1.00)
Chloride: 91 mmol/L — ABNORMAL LOW (ref 98–111)
GFR calc Af Amer: >60 mL/min (ref >60)
Glucose, Bld: 109 mg/dL — ABNORMAL HIGH (ref 70–99)
Potassium: 4.5 mmol/L (ref 3.5–5.1)
Sodium: 125 mmol/L — ABNORMAL LOW (ref 135–145)
Total Protein: 5.9 g/dL — ABNORMAL LOW (ref 6.5–8.1)

## 2018-05-08 LAB — BASIC METABOLIC PANEL
Anion gap: 12 (ref 5–15)
BUN: 9 mg/dL (ref 8–23)
CO2: 22 mmol/L (ref 22–32)
Calcium: 8.2 mg/dL — ABNORMAL LOW (ref 8.9–10.3)
Chloride: 99 mmol/L (ref 98–111)
Creatinine, Ser: 0.58 mg/dL (ref 0.44–1.00)
GFR calc Af Amer: >60 mL/min (ref >60)
GLUCOSE: 84 mg/dL (ref 70–99)
Potassium: 3.8 mmol/L (ref 3.5–5.1)
Sodium: 133 mmol/L — ABNORMAL LOW (ref 135–145)

## 2018-05-08 LAB — SODIUM, URINE, RANDOM

## 2018-05-08 LAB — I-STAT CG4 LACTIC ACID, ED: LACTIC ACID, VENOUS: 1.18 mmol/L (ref 0.5–1.9)

## 2018-05-08 LAB — LIPASE, BLOOD: LIPASE: 16 U/L (ref 11–51)

## 2018-05-08 MED ORDER — MORPHINE SULFATE (PF) 4 MG/ML IV SOLN
4.0000 mg | Freq: Once | INTRAVENOUS | Status: AC
  Administered 2018-05-08: 4 mg via INTRAVENOUS
  Filled 2018-05-08: qty 1

## 2018-05-08 MED ORDER — ONDANSETRON HCL 4 MG/2ML IJ SOLN: 4.0000 mg | Freq: Four times a day (QID) | INTRAMUSCULAR | Status: DC | PRN

## 2018-05-08 MED ORDER — PIPERACILLIN-TAZOBACTAM 3.375 G IVPB
3.3750 g | Freq: Three times a day (TID) | INTRAVENOUS | Status: DC
  Administered 2018-05-09 – 2018-05-12 (×11): 3.375 g via INTRAVENOUS
  Filled 2018-05-08 (×13): qty 50

## 2018-05-08 MED ORDER — IOPAMIDOL (ISOVUE-300) INJECTION 61%
INTRAVENOUS | Status: AC
  Filled 2018-05-08: qty 100

## 2018-05-08 MED ORDER — ONDANSETRON HCL 4 MG PO TABS: 4.0000 mg | ORAL_TABLET | Freq: Four times a day (QID) | ORAL | Status: DC | PRN

## 2018-05-08 MED ORDER — SODIUM CHLORIDE 0.9 % IV SOLN
INTRAVENOUS | Status: DC
  Administered 2018-05-08: 23:00:00 via INTRAVENOUS

## 2018-05-08 MED ORDER — PIPERACILLIN-TAZOBACTAM 3.375 G IVPB 30 MIN
3.3750 g | Freq: Once | INTRAVENOUS | Status: AC
  Administered 2018-05-08: 3.375 g via INTRAVENOUS
  Filled 2018-05-08: qty 50

## 2018-05-08 MED ORDER — DIPHENHYDRAMINE HCL 50 MG/ML IJ SOLN
12.5000 mg | Freq: Once | INTRAMUSCULAR | Status: AC
  Administered 2018-05-08: 12.5 mg via INTRAVENOUS
  Filled 2018-05-08: qty 1

## 2018-05-08 MED ORDER — CHOLECALCIFEROL 10 MCG (400 UNIT) PO TABS
800.0000 [IU] | ORAL_TABLET | Freq: Every day | ORAL | Status: DC
  Filled 2018-05-08: qty 2

## 2018-05-08 MED ORDER — ACETAMINOPHEN 325 MG PO TABS: 650.0000 mg | ORAL_TABLET | Freq: Four times a day (QID) | ORAL | Status: DC | PRN

## 2018-05-08 MED ORDER — CENTRUM PO CHEW
1.0000 | CHEWABLE_TABLET | Freq: Every day | ORAL | Status: DC
  Filled 2018-05-08: qty 1

## 2018-05-08 MED ORDER — LEVOTHYROXINE SODIUM 25 MCG PO TABS
25.0000 ug | ORAL_TABLET | Freq: Every day | ORAL | Status: DC
  Administered 2018-05-09 – 2018-05-18 (×10): 25 ug via ORAL
  Filled 2018-05-08 (×10): qty 1

## 2018-05-08 MED ORDER — ONDANSETRON HCL 4 MG/2ML IJ SOLN
4.0000 mg | Freq: Once | INTRAMUSCULAR | Status: AC
  Administered 2018-05-08: 4 mg via INTRAVENOUS
  Filled 2018-05-08: qty 2

## 2018-05-08 MED ORDER — MORPHINE SULFATE (PF) 2 MG/ML IV SOLN
2.0000 mg | INTRAVENOUS | Status: DC | PRN
  Administered 2018-05-09: 4 mg via INTRAVENOUS
  Administered 2018-05-24: 2 mg via INTRAVENOUS
  Filled 2018-05-08: qty 2
  Filled 2018-05-08: qty 1

## 2018-05-08 MED ORDER — IBUPROFEN 100 MG PO CHEW: 200.0000 mg | CHEWABLE_TABLET | Freq: Every day | ORAL | Status: DC | PRN

## 2018-05-08 MED ORDER — IBUPROFEN 200 MG PO TABS: 200.0000 mg | ORAL_TABLET | Freq: Every day | ORAL | Status: DC | PRN

## 2018-05-08 MED ORDER — IOPAMIDOL (ISOVUE-300) INJECTION 61%
100.0000 mL | Freq: Once | INTRAVENOUS | Status: AC | PRN
  Administered 2018-05-08: 100 mL via INTRAVENOUS

## 2018-05-08 MED ORDER — ENOXAPARIN SODIUM 30 MG/0.3ML ~~LOC~~ SOLN
30.0000 mg | Freq: Every day | SUBCUTANEOUS | Status: DC
  Administered 2018-05-08: 30 mg via SUBCUTANEOUS
  Filled 2018-05-08: qty 0.3

## 2018-05-08 MED ORDER — ACETAMINOPHEN 650 MG RE SUPP: 650.0000 mg | Freq: Four times a day (QID) | RECTAL | Status: DC | PRN

## 2018-05-08 MED ORDER — LORAZEPAM 0.5 MG PO TABS
0.2500 mg | ORAL_TABLET | Freq: Four times a day (QID) | ORAL | Status: DC | PRN
  Administered 2018-05-15: 0.25 mg via ORAL
  Filled 2018-05-08: qty 1

## 2018-05-08 MED ORDER — SODIUM CHLORIDE 0.9 % IV BOLUS
1000.0000 mL | Freq: Once | INTRAVENOUS | Status: AC
  Administered 2018-05-08: 1000 mL via INTRAVENOUS

## 2018-05-08 NOTE — ED Provider Notes (Signed)
[place] DEPT Provider Note   CSN: [id number] Arrival date & time: 05/08/18  1326     History   Chief Complaint Chief Complaint  Patient presents with  . Abdominal Pain    HPI Summer Jones is a 84 y.o. female.  HPI   84 year old female with past medical history of pancreatic cancer, bowel obstruction, here with diffuse abdominal pain.  The patient reports that approximately 3 days ago, she developed right lower quadrant abdominal pain.  Is initially mild but has progressively worsened.  She had associated severe abdominal distention, nausea, and has had difficulty eating and drinking.  She is had no bowel movement for 48 hours.  She had no flatus out.  She did take MiraLAX yesterday and intent to have a bowel movement, which is not abnormal for her.  Of note, she is scheduled to begin radiation tomorrow.  Denies any chemo use.  She sees Dr. Burr Medico.  Past Medical History:  Diagnosis Date  . Arthritis   . Common bile duct (CBD) obstruction   . Difficult intubation Anterior glottis, short TMD, limited mouth opening. Required glidescope and bougie stylet with multiple attempts.  . Diverticular disease   . Diverticulosis   . Essential hypertension   . Hypertension   . Hypothyroidism   . Jaundice   . Mitral valve prolapse   . Pancreatic cancer ([place]) 05/2017  . Thyroid disease     Patient Active Problem List   Diagnosis Date Noted  . Rectal bleeding 04/07/2018  . Malignant neoplasm of head of pancreas ([place]) 05/03/2017  . Gastric outlet obstruction   . Abdominal pain 11/24/2016  . UTI (urinary tract infection) 11/24/2016  . Essential hypertension   . Common bile duct (CBD) obstruction   . Biliary obstruction 08/06/2015  . Anemia 08/06/2015  . Hypothyroidism 06/13/2013  . Diverticulosis of colon without hemorrhage 06/13/2013  . S/P hernia repair 06/13/2013    Past Surgical History:  Procedure Laterality Date  . BREAST BIOPSY Right   .  BREAST CYST EXCISION Right   . ENDOSCOPIC RETROGRADE CHOLANGIOPANCREATOGRAPHY (ERCP) WITH PROPOFOL N/A 08/08/2015   Procedure: ENDOSCOPIC RETROGRADE CHOLANGIOPANCREATOGRAPHY (ERCP) WITH PROPOFOL;  Surgeon: [name], MD;  [facility];  Service: Endoscopy;  Laterality: N/A;  . 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 ESOPHAGOGASTRODUODENOSCOPY (EGD) WITH PROPOFOL N/A 11/25/2016   Procedure: ESOPHAGOGASTRODUODENOSCOPY (EGD) WITH PROPOFOL;  Surgeon: [name], MD;  [facility];  Service: Endoscopy;  Laterality: N/A;  . EUS N/A 08/08/2015   Procedure: UPPER ENDOSCOPIC ULTRASOUND (EUS) RADIAL;  Surgeon: [name], MD;  [facility];  Service: Endoscopy;  Laterality: N/A;  . INGUINAL HERNIA REPAIR     right  . LAPAROTOMY N/A 11/27/2016   Procedure: GASTROJEJUNOSTOMY;  Surgeon: Leighton Ruff, MD;  [facility];  Service: General;  Laterality: N/A;  . PANCREAS BIOPSY  11/27/2016   Procedure: PANCREACTIC BIOPSY;  Surgeon: Leighton Ruff, MD;  [facility];  Service: General;;  . Bess Kinds CHOLANGIOSCOPY N/A 12/26/2015   Procedure: CBULAGTX CHOLANGIOSCOPY;  Surgeon: [name], MD;  [facility];  Service: Endoscopy;  Laterality: N/A;  . TONSILLECTOMY       OB History   None      Home Medications    Prior to  Admission medications   Medication Sig Start Date End Date Taking? Authorizing Provider  acetaminophen (TYLENOL) 160 MG/5ML suspension Take by mouth as needed.    [provider]  amLODipine (NORVASC) 10 MG tablet Take 10 mg by mouth at bedtime.  06/16/15   [provider]  Cholecalciferol (VITAMIN D3) 400 units CAPS Take 2 capsules by mouth daily.     [provider]  hydrocortisone (ANUSOL-HC) 2.5 % rectal cream Place 1 application rectally 2 (two) times daily as needed for hemorrhoids or anal itching. 03/22/18   Alla Feeling, NP  levothyroxine (SYNTHROID, LEVOTHROID) 25 MCG tablet Take 25 mcg by mouth daily before breakfast.    [provider]  lipase/protease/amylase (CREON) 12000 units CPEP capsule Take 2 capsules (24,000 Units total) by mouth 3 (three) times daily before meals. Patient taking differently: Take 24,000 Units by mouth 2 (two) times daily.  07/29/17   Truitt Merle, MD  lisinopril (PRINIVIL,ZESTRIL) 5 MG tablet Take 5 mg by mouth at bedtime.     [provider]  LORazepam (ATIVAN) 0.5 MG tablet Take 0.5 tablets (0.25 mg total) by mouth every 6 (six) hours as needed for anxiety (or nausea). 05/02/18   Hayden Pedro, PA-C  Multiple Vitamins-Minerals (MULTIVITAMIN GUMMIES ADULT) CHEW Chew 1 tablet by mouth daily.    [provider]  polyethylene glycol powder (GLYCOLAX/MIRALAX) powder Take 17 g by mouth daily as needed for mild constipation.     [provider]  polyvinyl alcohol (LIQUIFILM TEARS) 1.4 % ophthalmic solution Place 1 drop into both eyes daily as needed for dry eyes.    [provider]  sodium chloride (OCEAN) 0.65 % nasal spray as needed.    [provider]  traMADol (ULTRAM) 50 MG tablet Take 1 tablet (50 mg total) by mouth every 6 (six) hours as needed. Patient not taking: Reported on 04/14/2018 08/16/17   Truitt Merle, MD    Family History Family History  Problem Relation Age of Onset  . Breast cancer Sister 54       treated with radiation  . Heart disease Father   . Colon cancer Neg Hx     Social History Social History   Tobacco Use  . Smoking status: Former Smoker    Packs/day: 1.00    Years: 10.00    Pack years: 10.00    Types: Cigarettes    Last attempt to quit: 1957    Years since quitting: 62.7  . Smokeless tobacco: Never Used    Substance Use Topics  . Alcohol use: No    Comment: no recent   . Drug use: No     Allergies   Sulfamethoxazole   Review of Systems Review of Systems  Constitutional: Positive for fatigue. Negative for chills and fever.  HENT: Negative for congestion and rhinorrhea.   Eyes: Negative for visual disturbance.  Respiratory: Negative for cough, shortness of breath and wheezing.   Cardiovascular: Negative for chest pain and leg swelling.  Gastrointestinal: Positive for abdominal pain, constipation and nausea. Negative for diarrhea and vomiting.  Genitourinary: Negative for dysuria and flank pain.  Musculoskeletal: Negative for neck pain and neck stiffness.  Skin: Negative for rash and wound.  Allergic/Immunologic: Negative for immunocompromised state.  Neurological: Negative for syncope, weakness and headaches.  All other systems reviewed and are negative.    Physical Exam Updated Vital Signs BP (!) 95/52   Pulse 86   Temp 98.8 F (37.1 C) (Oral)   Resp (!) 28  Ht 4' 11.49" (1.511 m)   Wt 35.7 kg   SpO2 98%   BMI 15.66 kg/m   Physical Exam  Constitutional: She is oriented to person, place, and time. She appears well-developed and well-nourished. She appears ill. She appears distressed.  HENT:  Head: Normocephalic and atraumatic.  Eyes: Conjunctivae are normal.  Neck: Neck supple.  Cardiovascular: Normal rate, regular rhythm and normal heart sounds. Exam reveals no friction rub.  No murmur heard. Pulmonary/Chest: Effort normal and breath sounds normal. No respiratory distress. She has no wheezes. She has no rales.  Abdominal: She exhibits distension. Bowel sounds are increased. There is generalized tenderness. There is rigidity, rebound and guarding.  Musculoskeletal: She exhibits no edema.  Neurological: She is alert and oriented to person, place, and time. She exhibits normal muscle tone.  Skin: Skin is warm. Capillary refill takes less than 2 seconds.   Psychiatric: She has a normal mood and affect.  Nursing note and vitals reviewed.    ED Treatments / Results  Labs (all labs ordered are listed, but only abnormal results are displayed) Labs Reviewed  COMPREHENSIVE METABOLIC PANEL - Abnormal; Notable for the following components:      Result Value   Sodium 125 (*)    Chloride 91 (*)    Glucose, Bld 109 (*)    Calcium 8.5 (*)    Total Protein 5.9 (*)    Albumin 2.8 (*)    Alkaline Phosphatase 223 (*)    Total Bilirubin 1.4 (*)    All other components within normal limits  CBC WITH DIFFERENTIAL/PLATELET - Abnormal; Notable for the following components:   RBC 3.33 (*)    Hemoglobin 9.9 (*)    HCT 28.9 (*)    Lymphs Abs 0.2 (*)    All other components within normal limits  I-STAT CHEM 8, ED - Abnormal; Notable for the following components:   Sodium 123 (*)    Chloride 91 (*)    Creatinine, Ser 0.40 (*)    Glucose, Bld 111 (*)    Calcium, Ion 1.08 (*)    Hemoglobin 9.9 (*)    HCT 29.0 (*)    All other components within normal limits  LIPASE, BLOOD  CBC WITH DIFFERENTIAL/PLATELET  URINALYSIS, ROUTINE W REFLEX MICROSCOPIC  I-STAT CG4 LACTIC ACID, ED  I-STAT CG4 LACTIC ACID, ED    EKG None  Radiology No results found.  Procedures Procedures (including critical care time)  Medications Ordered in ED Medications  iopamidol (ISOVUE-300) 61 % injection 100 mL (has no administration in time range)  iopamidol (ISOVUE-300) 61 % injection (has no administration in time range)  morphine 4 MG/ML injection 4 mg (4 mg Intravenous Given 05/08/18 1421)  ondansetron (ZOFRAN) injection 4 mg (4 mg Intravenous Given 05/08/18 1421)  sodium chloride 0.9 % bolus 1,000 mL ( Intravenous Stopped 05/08/18 1534)  diphenhydrAMINE (BENADRYL) injection 12.5 mg (12.5 mg Intravenous Given 05/08/18 1443)     Initial Impression / Assessment and Plan / ED Course  I have reviewed the triage vital signs and the nursing notes.  Pertinent labs &  imaging results that were available during my care of the patient were reviewed by me and considered in my medical decision making (see chart for details).     84 yo F with h/o pancreatic CA here with diffuse abd pain, distension. +rebound, guarding on exam. Exam, history is c/f SBO. No fever or signs of sepsis but perforation with peritonitis also on DDx. Will send for stat CT.  IVF, analgesia given.  Labs show hyponatremia, likely 2/2 dehydration. IVF given. No AMS, tremors, seizures. Plan to f/u CT.  Patient care transferred to Dr. Darl Householder at the end of my shift. Patient presentation, ED course, and plan of care discussed with review of all pertinent labs and imaging. Please see his/her note for further details regarding further ED course and disposition.   Final Clinical Impressions(s) / ED Diagnoses   Final diagnoses:  Abdominal distension  Hyponatremia  Malignant neoplasm of head of pancreas [facility])    ED Discharge Orders    None       Duffy Bruce, MD 05/08/18 [phone]

## 2018-05-08 NOTE — ED Notes (Signed)
Bed: WA03 Expected date:  Expected time:  Means of arrival:  Comments: 84 yo pancreatic CA, uncontrolled pain

## 2018-05-08 NOTE — Progress Notes (Signed)
Pharmacy Antibiotic Note  Summer Jones is a 84 y.o. female admitted on 05/08/2018 with with past medical history of pancreatic cancer, bowel obstruction, here with diffuse abdominal pain.Marland Kitchen  Pharmacy has been consulted for zosyn dosing.  Plan: Zosyn 3.375g IV Q8H infused over 4hrs. Follow renal function and cultures   Height: 4' 11.49" (151.1 cm) Weight: 78 lb 12.8 oz (35.7 kg) IBW/kg (Calculated) : 44.32  Temp (24hrs), Avg:98.8 F (37.1 C), Min:98.8 F (37.1 C), Max:98.8 F (37.1 C)  Recent Labs  Lab 05/08/18 1420 05/08/18 1427 05/08/18 1429 05/08/18 1452  WBC  --   --   --  8.1  CREATININE 0.50  --  0.40*  --   LATICACIDVEN  --  1.18  --   --     Estimated Creatinine Clearance: 28.4 mL/min (A) (by C-G formula based on SCr of 0.4 mg/dL (L)).    Allergies  Allergen Reactions  . Sulfamethoxazole Nausea Only    Antimicrobials this admission: 9/22 zosyn >>   Thank you for allowing pharmacy to be a part of this patient's care.  Dolly Rias RPh 05/08/2018, 7:14 PM Pager [phone]

## 2018-05-08 NOTE — ED Provider Notes (Signed)
  Physical Exam  BP 100/80   Pulse (!) 169   Temp 98.8 F (37.1 C) (Oral)   Resp (!) 21   Ht 4' 11.49" (1.511 m)   Wt 35.7 kg   SpO2 91%   BMI 15.66 kg/m   Physical Exam  ED Course/Procedures     Procedures  MDM  Care assumed from Dr. Myrene Buddy at 4 pm. Patient has hx of pancreatic cancer and supposed to start palliative radiation tomorrow. Has abdominal pain and vomiting and CT ab/pel pending at sign out.   5 pm CT showed urinary retention. Foley placed and 1 L came out. No obvious UTI. Also thickened gallbladder. Will get RUQ [place].   6:39 PM RUQ US showed acute cholecystitis. I talked to Dr. Excell Seltzer from gen surg. He states that given patient has pancreatic cancer and suppose to start palliative radiation, she is a poor surgical candidate. Recommend pain control, IV abx. If not improved, may need biliary drain. Recommend hospitalist admission.   CRITICAL CARE Performed by: Wandra Arthurs   Total critical care time: 30 minutes  Critical care time was exclusive of separately billable procedures and treating other patients.  Critical care was necessary to treat or prevent imminent or life-threatening deterioration.  Critical care was time spent personally by me on the following activities: development of treatment plan with patient and/or surrogate as well as nursing, discussions with consultants, evaluation of patient's response to treatment, examination of patient, obtaining history from patient or surrogate, ordering and performing treatments and interventions, ordering and review of laboratory studies, ordering and review of radiographic studies, pulse oximetry and re-evaluation of patient's condition.       Drenda Freeze, MD 05/08/18 [phone]

## 2018-05-08 NOTE — ED Notes (Signed)
ED TO INPATIENT HANDOFF REPORT  Name/Age/Gender Summer Jones 84 y.o. female  Code Status    Code Status Orders  (From admission, onward)         Start     Ordered   05/08/18 2004  Do not attempt resuscitation (DNR)  Continuous    Question Answer Comment  In the event of cardiac or respiratory ARREST Do not call a "code blue"   In the event of cardiac or respiratory ARREST Do not perform Intubation, CPR, defibrillation or ACLS   In the event of cardiac or respiratory ARREST Use medication by any route, position, wound care, and other measures to relive pain and suffering. May use oxygen, suction and manual treatment of airway obstruction as needed for comfort.      05/08/18 2004        Code Status History    Date Active Date Inactive Code Status Order ID Comments User Context   11/24/2016 0530 12/01/2016 1810 Full Code [id number]  Ivor Costa, MD ED   08/13/2015 1638 08/16/2015 1518 Full Code [id number]  Velvet Bathe, MD Inpatient   08/06/2015 1932 08/10/2015 1450 Full Code [id number]  Vianne Bulls, MD ED      Home/SNF/Other Home  Chief Complaint pancreatic cancer, abd pain  Level of Care/Admitting Diagnosis ED Disposition    ED Disposition Condition [place] [facility] Area: [facility] [[id number]]  Level of Care: Med-Surg [16]  Diagnosis: Acute cholecystitis [575.0.ICD-9-CM]  Admitting Physician: Etta Quill [[id number]]  Attending Physician: Etta Quill [[id number]]  Estimated length of stay: past midnight tomorrow  Certification:: I certify this patient will need inpatient services for at least 2 midnights  PT Class (Do Not Modify): Inpatient [101]  PT Acc Code (Do Not Modify): Private [1]       Medical History Past Medical History:  Diagnosis Date  . Arthritis   . Common bile duct (CBD) obstruction   . Difficult intubation Anterior glottis, short TMD, limited mouth opening. Required glidescope and bougie stylet with multiple  attempts.  . Diverticular disease   . Diverticulosis   . Essential hypertension   . Hypertension   . Hypothyroidism   . Jaundice   . Mitral valve prolapse   . Pancreatic cancer ([place]) 05/2017  . Thyroid disease     Allergies Allergies  Allergen Reactions  . Sulfamethoxazole Nausea Only    IV Location/Drains/Wounds Patient Lines/Drains/Airways Status   Active Line/Drains/Airways    Name:   Placement date:   Placement time:   Site:   Days:   Peripheral IV 05/08/18 Right Forearm   05/08/18    1441    Forearm   less than 1   Urethral Catheter G Garrison RN Temperature probe 14 Fr.   05/08/18    1645    Temperature probe   less than 1   GI Stent 8.5 Fr.   12/26/15    1433    -   864   Incision (Closed) 11/27/16 Abdomen   11/27/16    0957     527          Labs/Imaging Results for orders placed or performed during the hospital encounter of 05/08/18 (from the past 48 hour(s))  Urinalysis, Routine w reflex microscopic     Status: Abnormal   Collection Time: 05/08/18  2:00 PM  Result Value Ref Range   Color, Urine STRAW (A) YELLOW   APPearance CLEAR CLEAR   Specific Gravity, Urine 1.005  1.005 - 1.030   pH 6.0 5.0 - 8.0   Glucose, UA NEGATIVE NEGATIVE mg/dL   Hgb urine dipstick NEGATIVE NEGATIVE   Bilirubin Urine NEGATIVE NEGATIVE   Ketones, ur NEGATIVE NEGATIVE mg/dL   Protein, ur NEGATIVE NEGATIVE mg/dL   Nitrite NEGATIVE NEGATIVE   Leukocytes, UA NEGATIVE NEGATIVE    Comment: Performed at [facility] [address] [place], [place] [address]  Comprehensive metabolic panel     Status: Abnormal   Collection Time: 05/08/18  2:20 PM  Result Value Ref Range   Sodium 125 (L) 135 - 145 mmol/L   Potassium 4.5 3.5 - 5.1 mmol/L   Chloride 91 (L) 98 - 111 mmol/L   CO2 23 22 - 32 mmol/L   Glucose, Bld 109 (H) 70 - 99 mg/dL   BUN 12 8 - 23 mg/dL   Creatinine, Ser 0.50 0.44 - 1.00 mg/dL   Calcium 8.5 (L) 8.9 - 10.3 mg/dL   Total Protein 5.9 (L) 6.5 - 8.1 g/dL    Albumin 2.8 (L) 3.5 - 5.0 g/dL   AST 29 15 - 41 U/L   ALT 21 0 - 44 U/L   Alkaline Phosphatase 223 (H) 38 - 126 U/L   Total Bilirubin 1.4 (H) 0.3 - 1.2 mg/dL   GFR calc non Af Amer >60 >60 mL/min   GFR calc Af Amer >60 >60 mL/min    Comment: (NOTE) The eGFR has been calculated using the CKD EPI equation. This calculation has not been validated in all clinical situations. eGFR's persistently <60 mL/min signify possible Chronic Kidney Disease.    Anion gap 11 5 - 15    Comment: Performed at [facility], [place] [address], [place], [place] [address]  Lipase, blood     Status: None   Collection Time: 05/08/18  2:20 PM  Result Value Ref Range   Lipase 16 11 - 51 U/L    Comment: Performed at [facility], [place] [address] [place], [place] [address]  I-Stat CG4 Lactic Acid, ED     Status: None   Collection Time: 05/08/18  2:27 PM  Result Value Ref Range   Lactic Acid, Venous 1.18 0.5 - 1.9 mmol/L  I-Stat Chem 8, ED     Status: Abnormal   Collection Time: 05/08/18  2:29 PM  Result Value Ref Range   Sodium 123 (L) 135 - 145 mmol/L   Potassium 4.2 3.5 - 5.1 mmol/L   Chloride 91 (L) 98 - 111 mmol/L   BUN 10 8 - 23 mg/dL   Creatinine, Ser 0.40 (L) 0.44 - 1.00 mg/dL   Glucose, Bld 111 (H) 70 - 99 mg/dL   Calcium, Ion 1.08 (L) 1.15 - 1.40 mmol/L   TCO2 23 22 - 32 mmol/L   Hemoglobin 9.9 (L) 12.0 - 15.0 g/dL   HCT 29.0 (L) 36.0 - 46.0 %  CBC with Differential/Platelet     Status: Abnormal   Collection Time: 05/08/18  2:52 PM  Result Value Ref Range   WBC 8.1 4.0 - 10.5 K/uL   RBC 3.33 (L) 3.87 - 5.11 MIL/uL   Hemoglobin 9.9 (L) 12.0 - 15.0 g/dL   HCT 28.9 (L) 36.0 - 46.0 %   MCV 86.8 78.0 - 100.0 fL   MCH 29.7 26.0 - 34.0 pg   MCHC 34.3 30.0 - 36.0 g/dL   RDW 13.6 11.5 - 15.5 %   Platelets 301 150 - 400 K/uL   Neutrophils Relative % 92 %  Neutro Abs 7.4 1.7 - 7.7 K/uL   Lymphocytes Relative 3 %   Lymphs Abs 0.2 (L) 0.7 - 4.0 K/uL    Monocytes Relative 5 %   Monocytes Absolute 0.4 0.1 - 1.0 K/uL   Eosinophils Relative 0 %   Eosinophils Absolute 0.0 0.0 - 0.7 K/uL   Basophils Relative 0 %   Basophils Absolute 0.0 0.0 - 0.1 K/uL    Comment: Performed at [facility], [place] [address] [place], [place] [address]  Sodium, urine, random     Status: None   Collection Time: 05/08/18  7:12 PM  Result Value Ref Range   Sodium, Ur <10 mmol/L    Comment: Performed at [facility], [place] [address] [place], [place] [address]   Ct Abdomen Pelvis W Contrast  Result Date: 05/08/2018 CLINICAL DATA:  Pancreatic adenocarcinoma. Abdominal pain Distention. Gastrojejunostomy created 11/27/2016. EXAM: CT ABDOMEN AND PELVIS WITH CONTRAST TECHNIQUE: Multidetector CT imaging of the abdomen and pelvis was performed using the standard protocol following bolus administration of intravenous contrast. CONTRAST:  128m ISOVUE-300 IOPAMIDOL (ISOVUE-300) INJECTION 61% COMPARISON:  03/29/2018 and previous FINDINGS: Lower chest: Trace pleural effusions right greater than left, new since previous. Minimal dependent atelectasis posteriorly in the right lung base. Moderate hiatal hernia. Hepatobiliary: Stable metallic biliary stent. No intrahepatic biliary ductal dilatation. Thick-walled gallbladder with mucosal enhancement. Small low-attenuation liver lesions. 8 mm low-attenuation lesion in the inferior right lobe, image 28/2, not present on prior studies. Ill-defined sub 7 mm low-attenuation focus in segment 8 image 12/2, not evident on prior studies. Pancreas: Dilatation of pancreatic duct if parenchymal atrophy. Little convincing change in the ill-defined soft tissue attenuation in the region the pancreatic head, margins indistinct. Spleen: Normal in size without focal abnormality. Adrenals/Urinary Tract: Normal adrenal glands. Unremarkable kidneys. Marked distention of the urinary bladder. Stomach/Bowel: Moderate hiatal hernia.  Changes of gastrojejunostomy. Stomach is nondilated. Small bowel nondilated. Appendix not identified. Colon decompressed, unremarkable. Vascular/Lymphatic: Mild scattered calcified atheromatous plaque. No aortic aneurysm. High-grade tapered narrowing near the portosplenic confluence. Reproductive: Uterus and bilateral adnexa are unremarkable. Other: New scattered mild perihepatic, perisplenic, and pelvic ascites. No free air. Musculoskeletal: Lumbar levoscoliosis with multilevel spondylitic change. Grade 1 anterolisthesis L5-S1 probably secondary to facet DJD. No fracture or worrisome bone lesion. IMPRESSION: 1. Small amount of abdominal and pelvic ascites, new since previous. 2. Marked distention of the urinary bladder 3. New small bilateral pleural effusions 4. Persistent gallbladder dilatation with thickened enhancing gallbladder wall, suggesting cholecystitis. 5. New low-attenuation liver lesions suggesting metastatic disease. Electronically Signed   By: DLucrezia EuropeM.D.   On: 05/08/2018 16:37   [place]Abdomen Limited Ruq  Result Date: 05/08/2018 CLINICAL DATA:  85year old female with RIGHT UPPER quadrant abdominal pain and nausea for 3 days. History of pancreatic cancer. EXAM: ULTRASOUND ABDOMEN LIMITED RIGHT UPPER QUADRANT COMPARISON:  05/08/2018 CT FINDINGS: Gallbladder: Distended gallbladder with marked wall thickening noted. Gallbladder sludge identified. A positive sonographic MPercell Millersign is noted as well as a small amount of pericholecystic fluid. Common bile duct: Diameter: 6 mm. Liver: No definite focal abnormalities. The abnormalities identified on CT are not well visualized sonographically. Portal vein is patent on color Doppler imaging with normal direction of blood flow towards the liver. Small amount of ascites noted. IMPRESSION: Distended gallbladder with gallbladder wall thickening, pericholecystic fluid and positive sonographic Murphy sign compatible with acute cholecystitis. No biliary  dilatation. Small amount of ascites. Electronically Signed   By: JCleatis PolkaD.  On: 05/08/2018 17:48    Pending Labs Unresulted Labs (From admission, onward)    Start     Ordered   05/09/18 0500  CBC  Tomorrow morning,   R     05/08/18 2004   05/09/18 0500  Comprehensive metabolic panel  Tomorrow morning,   R     05/08/18 2004   05/08/18 3735  Basic metabolic panel  Once,   R     05/08/18 2010   05/08/18 1912  Osmolality, urine  Once,   R     05/08/18 1911   05/08/18 1400  CBC with Differential  STAT,   STAT     05/08/18 1359          Vitals/Pain Today's Vitals   05/08/18 1800 05/08/18 1830 05/08/18 1900 05/08/18 1909  BP: (!) 98/50 100/80 (!) 112/56   Pulse: 86 (!) 169 93   Resp: 20 (!) 21 (!) 22   Temp:      TempSrc:      SpO2: 99% 91% 91%   Weight:      Height:      PainSc:    7     Isolation Precautions No active isolations  Medications Medications  0.9 %  sodium chloride infusion (has no administration in time range)  morphine 2 MG/ML injection 2-4 mg (has no administration in time range)  piperacillin-tazobactam (ZOSYN) IVPB 3.375 g (has no administration in time range)  acetaminophen (TYLENOL) tablet 650 mg (has no administration in time range)    Or  acetaminophen (TYLENOL) suppository 650 mg (has no administration in time range)  ondansetron (ZOFRAN) tablet 4 mg (has no administration in time range)    Or  ondansetron (ZOFRAN) injection 4 mg (has no administration in time range)  enoxaparin (LOVENOX) injection 40 mg (has no administration in time range)  levothyroxine (SYNTHROID, LEVOTHROID) tablet 25 mcg (has no administration in time range)  ibuprofen (ADVIL,MOTRIN) chewable tablet 200 mg (has no administration in time range)  LORazepam (ATIVAN) tablet 0.25 mg (has no administration in time range)  MULTIVITAMIN GUMMIES ADULT CHEW 1 tablet (has no administration in time range)  Vitamin D3 CAPS 2 capsule (has no administration in time range)   morphine 4 MG/ML injection 4 mg (4 mg Intravenous Given 05/08/18 1421)  ondansetron (ZOFRAN) injection 4 mg (4 mg Intravenous Given 05/08/18 1421)  sodium chloride 0.9 % bolus 1,000 mL ( Intravenous Stopped 05/08/18 1534)  diphenhydrAMINE (BENADRYL) injection 12.5 mg (12.5 mg Intravenous Given 05/08/18 1443)  iopamidol (ISOVUE-300) 61 % injection 100 mL (100 mLs Intravenous Contrast Given 05/08/18 1616)  morphine 4 MG/ML injection 4 mg (4 mg Intravenous Given 05/08/18 1721)  sodium chloride 0.9 % bolus 1,000 mL (0 mLs Intravenous Stopped 05/08/18 1835)  piperacillin-tazobactam (ZOSYN) IVPB 3.375 g (0 g Intravenous Stopped 05/08/18 1908)    Mobility non-ambulatory

## 2018-05-08 NOTE — ED Notes (Signed)
ED TO INPATIENT HANDOFF REPORT  Name/Age/Gender Summer Jones 84 y.o. female  Code Status Code Status History    Date Active Date Inactive Code Status Order ID Comments User Context   11/24/2016 0530 12/01/2016 1810 Full Code [id number]  Ivor Costa, MD ED   08/13/2015 1638 08/16/2015 1518 Full Code [id number]  Velvet Bathe, MD Inpatient   08/06/2015 1932 08/10/2015 1450 Full Code [id number]  Vianne Bulls, MD ED      Home/SNF/Other Home  Chief Complaint pancreatic cancer, abd pain  Level of Care/Admitting Diagnosis ED Disposition    None      Medical History Past Medical History:  Diagnosis Date  . Arthritis   . Common bile duct (CBD) obstruction   . Difficult intubation Anterior glottis, short TMD, limited mouth opening. Required glidescope and bougie stylet with multiple attempts.  . Diverticular disease   . Diverticulosis   . Essential hypertension   . Hypertension   . Hypothyroidism   . Jaundice   . Mitral valve prolapse   . Pancreatic cancer ([place]) 05/2017  . Thyroid disease     Allergies Allergies  Allergen Reactions  . Sulfamethoxazole Nausea Only    IV Location/Drains/Wounds Patient Lines/Drains/Airways Status   Active Line/Drains/Airways    Name:   Placement date:   Placement time:   Site:   Days:   Peripheral IV 05/08/18 Right Forearm   05/08/18    1441    Forearm   less than 1   Urethral Catheter Marcy Panning RN Temperature probe 14 Fr.   05/08/18    1645    Temperature probe   less than 1   External Urinary Catheter   05/08/18    1413    -   less than 1   GI Stent 8.5 Fr.   12/26/15    1433    -   864   Incision (Closed) 11/27/16 Abdomen   11/27/16    0957     527          Labs/Imaging Results for orders placed or performed during the hospital encounter of 05/08/18 (from the past 48 hour(s))  Urinalysis, Routine w reflex microscopic     Status: Abnormal   Collection Time: 05/08/18  2:00 PM  Result Value Ref Range   Color, Urine STRAW (A)  YELLOW   APPearance CLEAR CLEAR   Specific Gravity, Urine 1.005 1.005 - 1.030   pH 6.0 5.0 - 8.0   Glucose, UA NEGATIVE NEGATIVE mg/dL   Hgb urine dipstick NEGATIVE NEGATIVE   Bilirubin Urine NEGATIVE NEGATIVE   Ketones, ur NEGATIVE NEGATIVE mg/dL   Protein, ur NEGATIVE NEGATIVE mg/dL   Nitrite NEGATIVE NEGATIVE   Leukocytes, UA NEGATIVE NEGATIVE    Comment: Performed at [facility], [place] [address], [place],  [address]  Comprehensive metabolic panel     Status: Abnormal   Collection Time: 05/08/18  2:20 PM  Result Value Ref Range   Sodium 125 (L) 135 - 145 mmol/L   Potassium 4.5 3.5 - 5.1 mmol/L   Chloride 91 (L) 98 - 111 mmol/L   CO2 23 22 - 32 mmol/L   Glucose, Bld 109 (H) 70 - 99 mg/dL   BUN 12 8 - 23 mg/dL   Creatinine, Ser 0.50 0.44 - 1.00 mg/dL   Calcium 8.5 (L) 8.9 - 10.3 mg/dL   Total Protein 5.9 (L) 6.5 - 8.1 g/dL   Albumin 2.8 (L) 3.5 - 5.0 g/dL   AST 29  15 - 41 U/L   ALT 21 0 - 44 U/L   Alkaline Phosphatase 223 (H) 38 - 126 U/L   Total Bilirubin 1.4 (H) 0.3 - 1.2 mg/dL   GFR calc non Af Amer >60 >60 mL/min   GFR calc Af Amer >60 >60 mL/min    Comment: (NOTE) The eGFR has been calculated using the CKD EPI equation. This calculation has not been validated in all clinical situations. eGFR's persistently <60 mL/min signify possible Chronic Kidney Disease.    Anion gap 11 5 - 15    Comment: Performed at [facility], [place] [address], [place], [place] [address]  Lipase, blood     Status: None   Collection Time: 05/08/18  2:20 PM  Result Value Ref Range   Lipase 16 11 - 51 U/L    Comment: Performed at [facility], [place] [address], [place], [place] [address]  I-Stat CG4 Lactic Acid, ED     Status: None   Collection Time: 05/08/18  2:27 PM  Result Value Ref Range   Lactic Acid, Venous 1.18 0.5 - 1.9 mmol/L  I-Stat Chem 8, ED     Status: Abnormal   Collection Time: 05/08/18  2:29 PM  Result Value Ref  Range   Sodium 123 (L) 135 - 145 mmol/L   Potassium 4.2 3.5 - 5.1 mmol/L   Chloride 91 (L) 98 - 111 mmol/L   BUN 10 8 - 23 mg/dL   Creatinine, Ser 0.40 (L) 0.44 - 1.00 mg/dL   Glucose, Bld 111 (H) 70 - 99 mg/dL   Calcium, Ion 1.08 (L) 1.15 - 1.40 mmol/L   TCO2 23 22 - 32 mmol/L   Hemoglobin 9.9 (L) 12.0 - 15.0 g/dL   HCT 29.0 (L) 36.0 - 46.0 %  CBC with Differential/Platelet     Status: Abnormal   Collection Time: 05/08/18  2:52 PM  Result Value Ref Range   WBC 8.1 4.0 - 10.5 K/uL   RBC 3.33 (L) 3.87 - 5.11 MIL/uL   Hemoglobin 9.9 (L) 12.0 - 15.0 g/dL   HCT 28.9 (L) 36.0 - 46.0 %   MCV 86.8 78.0 - 100.0 fL   MCH 29.7 26.0 - 34.0 pg   MCHC 34.3 30.0 - 36.0 g/dL   RDW 13.6 11.5 - 15.5 %   Platelets 301 150 - 400 K/uL   Neutrophils Relative % 92 %   Neutro Abs 7.4 1.7 - 7.7 K/uL   Lymphocytes Relative 3 %   Lymphs Abs 0.2 (L) 0.7 - 4.0 K/uL   Monocytes Relative 5 %   Monocytes Absolute 0.4 0.1 - 1.0 K/uL   Eosinophils Relative 0 %   Eosinophils Absolute 0.0 0.0 - 0.7 K/uL   Basophils Relative 0 %   Basophils Absolute 0.0 0.0 - 0.1 K/uL    Comment: Performed at [facility], [place] [address] [place],  [address]   Ct Abdomen Pelvis W Contrast  Result Date: 05/08/2018 CLINICAL DATA:  Pancreatic adenocarcinoma. Abdominal pain Distention. Gastrojejunostomy created 11/27/2016. EXAM: CT ABDOMEN AND PELVIS WITH CONTRAST TECHNIQUE: Multidetector CT imaging of the abdomen and pelvis was performed using the standard protocol following bolus administration of intravenous contrast. CONTRAST:  122m ISOVUE-300 IOPAMIDOL (ISOVUE-300) INJECTION 61% COMPARISON:  03/29/2018 and previous FINDINGS: Lower chest: Trace pleural effusions right greater than left, new since previous. Minimal dependent atelectasis posteriorly in the right lung base. Moderate hiatal hernia. Hepatobiliary: Stable metallic biliary stent. No intrahepatic biliary ductal dilatation. Thick-walled  gallbladder with mucosal enhancement. Small low-attenuation liver lesions. 8 mm low-attenuation lesion in the inferior right lobe, image 28/2, not present on prior studies. Ill-defined sub 7 mm low-attenuation focus in segment 8 image 12/2, not evident on prior studies. Pancreas: Dilatation of pancreatic duct if parenchymal atrophy. Little convincing change in the ill-defined soft tissue attenuation in the region the pancreatic head, margins indistinct. Spleen: Normal in size without focal abnormality. Adrenals/Urinary Tract: Normal adrenal glands. Unremarkable kidneys. Marked distention of the urinary bladder. Stomach/Bowel: Moderate hiatal hernia. Changes of gastrojejunostomy. Stomach is nondilated. Small bowel nondilated. Appendix not identified. Colon decompressed, unremarkable. Vascular/Lymphatic: Mild scattered calcified atheromatous plaque. No aortic aneurysm. High-grade tapered narrowing near the portosplenic confluence. Reproductive: Uterus and bilateral adnexa are unremarkable. Other: New scattered mild perihepatic, perisplenic, and pelvic ascites. No free air. Musculoskeletal: Lumbar levoscoliosis with multilevel spondylitic change. Grade 1 anterolisthesis L5-S1 probably secondary to facet DJD. No fracture or worrisome bone lesion. IMPRESSION: 1. Small amount of abdominal and pelvic ascites, new since previous. 2. Marked distention of the urinary bladder 3. New small bilateral pleural effusions 4. Persistent gallbladder dilatation with thickened enhancing gallbladder wall, suggesting cholecystitis. 5. New low-attenuation liver lesions suggesting metastatic disease. Electronically Signed   By: Lucrezia Europe M.D.   On: 05/08/2018 16:37   US Abdomen Limited Ruq  Result Date: 05/08/2018 CLINICAL DATA:  84 year old female with RIGHT UPPER quadrant abdominal pain and nausea for 3 days. History of pancreatic cancer. EXAM: ULTRASOUND ABDOMEN LIMITED RIGHT UPPER QUADRANT COMPARISON:  05/08/2018 CT FINDINGS:  Gallbladder: Distended gallbladder with marked wall thickening noted. Gallbladder sludge identified. A positive sonographic Percell Miller sign is noted as well as a small amount of pericholecystic fluid. Common bile duct: Diameter: 6 mm. Liver: No definite focal abnormalities. The abnormalities identified on CT are not well visualized sonographically. Portal vein is patent on color Doppler imaging with normal direction of blood flow towards the liver. Small amount of ascites noted. IMPRESSION: Distended gallbladder with gallbladder wall thickening, pericholecystic fluid and positive sonographic Murphy sign compatible with acute cholecystitis. No biliary dilatation. Small amount of ascites. Electronically Signed   By: Margarette Canada M.D.   On: 05/08/2018 17:48    Pending Labs Unresulted Labs (From admission, onward)    Start     Ordered   05/08/18 1400  CBC with Differential  STAT,   STAT     05/08/18 1359          Vitals/Pain Today's Vitals   05/08/18 1700 05/08/18 1730 05/08/18 1753 05/08/18 1800  BP: (!) 86/74 112/64  (!) 98/50  Pulse: 72 84  86  Resp: (!) 23 (!) 23  20  Temp:      TempSrc:      SpO2: (!) 66% 95%  99%  Weight:      Height:      PainSc:   3      Isolation Precautions No active isolations  Medications Medications  piperacillin-tazobactam (ZOSYN) IVPB 3.375 g (has no administration in time range)  morphine 4 MG/ML injection 4 mg (4 mg Intravenous Given 05/08/18 1421)  ondansetron (ZOFRAN) injection 4 mg (4 mg Intravenous Given 05/08/18 1421)  sodium chloride 0.9 % bolus 1,000 mL ( Intravenous Stopped 05/08/18 1534)  diphenhydrAMINE (BENADRYL) injection 12.5 mg (12.5 mg Intravenous Given 05/08/18 1443)  iopamidol (ISOVUE-300) 61 % injection 100 mL (100 mLs Intravenous Contrast Given 05/08/18 1616)  morphine 4 MG/ML injection 4 mg (4 mg Intravenous Given 05/08/18 1721)  sodium chloride 0.9 % bolus 1,000 mL (  Intravenous Rate/Dose Verify 05/08/18 1806)    Mobility walks with  device

## 2018-05-08 NOTE — Progress Notes (Signed)
Rx Brief note: Lovenox  Wt=35 kg, CrCl~28 ml/min  Rx adjusted Lovenox to 30 mg daily in pt with wt<45 kg and CrCl<30 ml/min  Thanks Dorrene German 05/08/2018 10:09 PM

## 2018-05-08 NOTE — H&P (Signed)
History and Physical    Summer Jones KZS:[id number] DOB: August 05, 1930 DOA: 05/08/2018  PCP: Crist Infante, MD  Patient coming from: Home  I have personally briefly reviewed patient's old medical records in [place]  Chief Complaint: Abd pain  HPI: Summer Jones is a 84 y.o. female with medical history significant of Pancreatic cancer with liver mets, prior CBD obstruction s/p stent, HTN.  Patient presents to the ED with c/o RUQ abd pain.  Symptoms onset 3 days ago.  Initially mild but has progressively worsened.  Associated distention, nausea, poor PO intake especially over last day.  Miralax yesterday didn't help.  No BM for 48 hours.   ED Course: T.bili 1.4.  Alk 223, AST ALT nl.  Sodium 125 was 134 on Aug 6.  CT and US show acute cholecystitis, small amount of ascites, and the known pancreatic cancer with mets to liver.  Colon decompressed.   Review of Systems: As per HPI otherwise 10 point review of systems negative.   Past Medical History:  Diagnosis Date  . Arthritis   . Common bile duct (CBD) obstruction   . Difficult intubation Anterior glottis, short TMD, limited mouth opening. Required glidescope and bougie stylet with multiple attempts.  . Diverticular disease   . Diverticulosis   . Essential hypertension   . Hypertension   . Hypothyroidism   . Jaundice   . Mitral valve prolapse   . Pancreatic cancer ([place]) 05/2017  . Thyroid disease     Past Surgical History:  Procedure Laterality Date  . BREAST BIOPSY Right   . BREAST CYST EXCISION Right   . ENDOSCOPIC RETROGRADE CHOLANGIOPANCREATOGRAPHY (ERCP) WITH PROPOFOL N/A 08/08/2015   Procedure: ENDOSCOPIC RETROGRADE CHOLANGIOPANCREATOGRAPHY (ERCP) WITH PROPOFOL;  Surgeon: [name], MD;  Location: [facility];  Service: Endoscopy;  Laterality: N/A;  . ENDOSCOPIC RETROGRADE CHOLANGIOPANCREATOGRAPHY (ERCP) WITH PROPOFOL N/A 12/26/2015   Procedure: ENDOSCOPIC RETROGRADE CHOLANGIOPANCREATOGRAPHY (ERCP) WITH  PROPOFOL;  Surgeon: [name], MD;  Location: [facility];  Service: Endoscopy;  Laterality: N/A;  [employer] scientific rep needed  . ESOPHAGOGASTRODUODENOSCOPY (EGD) WITH PROPOFOL N/A 11/25/2016   Procedure: ESOPHAGOGASTRODUODENOSCOPY (EGD) WITH PROPOFOL;  Surgeon: [name], MD;  Location: [facility];  Service: Endoscopy;  Laterality: N/A;  . EUS N/A 08/08/2015   Procedure: UPPER ENDOSCOPIC ULTRASOUND (EUS) RADIAL;  Surgeon: [name], MD;  Location: [facility];  Service: Endoscopy;  Laterality: N/A;  . INGUINAL HERNIA REPAIR     right  . LAPAROTOMY N/A 11/27/2016   Procedure: GASTROJEJUNOSTOMY;  Surgeon: Leighton Ruff, MD;  Location: [facility];  Service: General;  Laterality: N/A;  . PANCREAS BIOPSY  11/27/2016   Procedure: PANCREACTIC BIOPSY;  Surgeon: Leighton Ruff, MD;  Location: [facility];  Service: General;;  . Bess Kinds CHOLANGIOSCOPY N/A 12/26/2015   Procedure: DDUKGURK CHOLANGIOSCOPY;  Surgeon: [name], MD;  Location: [facility];  Service: Endoscopy;  Laterality: N/A;  . TONSILLECTOMY       reports that she quit smoking about 62 years ago. Her smoking use included cigarettes. She has a 10.00 pack-year smoking history. She has never used smokeless tobacco. She reports that she does not drink alcohol or use drugs.  Allergies  Allergen Reactions  . Sulfamethoxazole Nausea Only    Family History  Problem Relation Age of Onset  . Breast cancer Sister 110       treated with radiation  . Heart disease Father   . Colon cancer Neg Hx  Prior to Admission medications   Medication Sig Start Date End Date Taking? Authorizing Provider  amLODipine (NORVASC) 10 MG tablet Take 10 mg by mouth at bedtime.  06/16/15  Yes [provider]  hydrocortisone (ANUSOL-HC) 2.5 % rectal cream Place 1 application rectally 2 (two) times daily as needed for hemorrhoids or anal itching. 03/22/18  Yes Alla Feeling, NP  ibuprofen (ADVIL,MOTRIN) 100 MG chewable  tablet Chew 200 mg by mouth daily as needed for mild pain.   Yes [provider]  levothyroxine (SYNTHROID, LEVOTHROID) 25 MCG tablet Take 25 mcg by mouth daily before breakfast.   Yes [provider]  lipase/protease/amylase (CREON) 12000 units CPEP capsule Take 2 capsules (24,000 Units total) by mouth 3 (three) times daily before meals. Patient taking differently: Take 24,000 Units by mouth 2 (two) times daily.  07/29/17  Yes Truitt Merle, MD  lisinopril (PRINIVIL,ZESTRIL) 5 MG tablet Take 5 mg by mouth at bedtime.    Yes [provider]  LORazepam (ATIVAN) 0.5 MG tablet Take 0.5 tablets (0.25 mg total) by mouth every 6 (six) hours as needed for anxiety (or nausea). 05/02/18  Yes Hayden Pedro, PA-C  Multiple Vitamins-Minerals (MULTIVITAMIN GUMMIES ADULT) CHEW Chew 1 tablet by mouth daily.   Yes [provider]  polyethylene glycol powder (GLYCOLAX/MIRALAX) powder Take 17 g by mouth daily as needed for mild constipation.    Yes [provider]  traMADol (ULTRAM) 50 MG tablet Take 1 tablet (50 mg total) by mouth every 6 (six) hours as needed. Patient taking differently: Take 50 mg by mouth every 6 (six) hours as needed for moderate pain.  08/16/17  Yes Truitt Merle, MD  Cholecalciferol (VITAMIN D3) 400 units CAPS Take 2 capsules by mouth daily.    [provider]    Physical Exam: Vitals:   05/08/18 1730 05/08/18 1800 05/08/18 1830 05/08/18 1900  BP: 112/64 (!) 98/50 100/80 (!) 112/56  Pulse: 84 86 (!) 169 93  Resp: (!) 23 20 (!) 21 (!) 22  Temp:      TempSrc:      SpO2: 95% 99% 91% 91%  Weight:      Height:        Constitutional: Chronically ill appearing, malnourished with muscle wasting Eyes: PERRL, lids and conjunctivae normal ENMT: Mucous membranes are moist. Posterior pharynx clear of any exudate or lesions.Normal dentition.  Neck: normal, supple, no masses, no thyromegaly Respiratory: clear to auscultation bilaterally, no  wheezing, no crackles. Normal respiratory effort. No accessory muscle use.  Cardiovascular: Regular rate and rhythm, no murmurs / rubs / gallops. No extremity edema. 2+ pedal pulses. No carotid bruits.  Abdomen: RUQ TTP Musculoskeletal: no clubbing / cyanosis. No joint deformity upper and lower extremities. Good ROM, no contractures. Normal muscle tone.  Skin: no rashes, lesions, ulcers. No induration Neurologic: CN 2-12 grossly intact. Sensation intact, DTR normal. Strength 5/5 in all 4.  Psychiatric: Normal judgment and insight. Alert and oriented x 3. Normal mood.    Labs on Admission: I have personally reviewed following labs and imaging studies  CBC: Recent Labs  Lab 05/08/18 1429 05/08/18 1452  WBC  --  8.1  NEUTROABS  --  7.4  HGB 9.9* 9.9*  HCT 29.0* 28.9*  MCV  --  86.8  PLT  --  540   Basic Metabolic Panel: Recent Labs  Lab 05/08/18 1420 05/08/18 1429  NA 125* 123*  K 4.5 4.2  CL 91* 91*  CO2 23  --  GLUCOSE 109* 111*  BUN 12 10  CREATININE 0.50 0.40*  CALCIUM 8.5*  --    GFR: Estimated Creatinine Clearance: 28.4 mL/min (A) (by C-G formula based on SCr of 0.4 mg/dL (L)). Liver Function Tests: Recent Labs  Lab 05/08/18 1420  AST 29  ALT 21  ALKPHOS 223*  BILITOT 1.4*  PROT 5.9*  ALBUMIN 2.8*   Recent Labs  Lab 05/08/18 1420  LIPASE 16   No results for input(s): AMMONIA in the last 168 hours. Coagulation Profile: No results for input(s): INR, PROTIME in the last 168 hours. Cardiac Enzymes: No results for input(s): CKTOTAL, CKMB, CKMBINDEX, TROPONINI in the last 168 hours. BNP (last 3 results) No results for input(s): PROBNP in the last 8760 hours. HbA1C: No results for input(s): HGBA1C in the last 72 hours. CBG: No results for input(s): GLUCAP in the last 168 hours. Lipid Profile: No results for input(s): CHOL, HDL, LDLCALC, TRIG, CHOLHDL, LDLDIRECT in the last 72 hours. Thyroid Function Tests: No results for input(s): TSH, T4TOTAL,  FREET4, T3FREE, THYROIDAB in the last 72 hours. Anemia Panel: No results for input(s): VITAMINB12, FOLATE, FERRITIN, TIBC, IRON, RETICCTPCT in the last 72 hours. Urine analysis:    Component Value Date/Time   COLORURINE STRAW (A) 05/08/2018 1400   APPEARANCEUR CLEAR 05/08/2018 1400   LABSPEC 1.005 05/08/2018 1400   PHURINE 6.0 05/08/2018 1400   GLUCOSEU NEGATIVE 05/08/2018 1400   HGBUR NEGATIVE 05/08/2018 1400   BILIRUBINUR NEGATIVE 05/08/2018 1400   KETONESUR NEGATIVE 05/08/2018 1400   PROTEINUR NEGATIVE 05/08/2018 1400   NITRITE NEGATIVE 05/08/2018 1400   LEUKOCYTESUR NEGATIVE 05/08/2018 1400    Radiological Exams on Admission: Ct Abdomen Pelvis W Contrast  Result Date: 05/08/2018 CLINICAL DATA:  Pancreatic adenocarcinoma. Abdominal pain Distention. Gastrojejunostomy created 11/27/2016. EXAM: CT ABDOMEN AND PELVIS WITH CONTRAST TECHNIQUE: Multidetector CT imaging of the abdomen and pelvis was performed using the standard protocol following bolus administration of intravenous contrast. CONTRAST:  135m ISOVUE-300 IOPAMIDOL (ISOVUE-300) INJECTION 61% COMPARISON:  03/29/2018 and previous FINDINGS: Lower chest: Trace pleural effusions right greater than left, new since previous. Minimal dependent atelectasis posteriorly in the right lung base. Moderate hiatal hernia. Hepatobiliary: Stable metallic biliary stent. No intrahepatic biliary ductal dilatation. Thick-walled gallbladder with mucosal enhancement. Small low-attenuation liver lesions. 8 mm low-attenuation lesion in the inferior right lobe, image 28/2, not present on prior studies. Ill-defined sub 7 mm low-attenuation focus in segment 8 image 12/2, not evident on prior studies. Pancreas: Dilatation of pancreatic duct if parenchymal atrophy. Little convincing change in the ill-defined soft tissue attenuation in the region the pancreatic head, margins indistinct. Spleen: Normal in size without focal abnormality. Adrenals/Urinary Tract: Normal  adrenal glands. Unremarkable kidneys. Marked distention of the urinary bladder. Stomach/Bowel: Moderate hiatal hernia. Changes of gastrojejunostomy. Stomach is nondilated. Small bowel nondilated. Appendix not identified. Colon decompressed, unremarkable. Vascular/Lymphatic: Mild scattered calcified atheromatous plaque. No aortic aneurysm. High-grade tapered narrowing near the portosplenic confluence. Reproductive: Uterus and bilateral adnexa are unremarkable. Other: New scattered mild perihepatic, perisplenic, and pelvic ascites. No free air. Musculoskeletal: Lumbar levoscoliosis with multilevel spondylitic change. Grade 1 anterolisthesis L5-S1 probably secondary to facet DJD. No fracture or worrisome bone lesion. IMPRESSION: 1. Small amount of abdominal and pelvic ascites, new since previous. 2. Marked distention of the urinary bladder 3. New small bilateral pleural effusions 4. Persistent gallbladder dilatation with thickened enhancing gallbladder wall, suggesting cholecystitis. 5. New low-attenuation liver lesions suggesting metastatic disease. Electronically Signed   By: DLucrezia EuropeM.D.   On: 05/08/2018  16:37   US Abdomen Limited Ruq  Result Date: 05/08/2018 CLINICAL DATA:  84 year old female with RIGHT UPPER quadrant abdominal pain and nausea for 3 days. History of pancreatic cancer. EXAM: ULTRASOUND ABDOMEN LIMITED RIGHT UPPER QUADRANT COMPARISON:  05/08/2018 CT FINDINGS: Gallbladder: Distended gallbladder with marked wall thickening noted. Gallbladder sludge identified. A positive sonographic Percell Miller sign is noted as well as a small amount of pericholecystic fluid. Common bile duct: Diameter: 6 mm. Liver: No definite focal abnormalities. The abnormalities identified on CT are not well visualized sonographically. Portal vein is patent on color Doppler imaging with normal direction of blood flow towards the liver. Small amount of ascites noted. IMPRESSION: Distended gallbladder with gallbladder wall  thickening, pericholecystic fluid and positive sonographic Murphy sign compatible with acute cholecystitis. No biliary dilatation. Small amount of ascites. Electronically Signed   By: Margarette Canada M.D.   On: 05/08/2018 17:48    EKG: Independently reviewed.  Assessment/Plan Principal Problem:   Acute cholecystitis Active Problems:   Hyponatremia   Essential hypertension   Pancreatic cancer metastasized to liver ([place])    1. Acute cholecystitis - 1. EDP spoke with gen surg: 1. Patient not felt to be great surgical candidate 2. Cholecystostomy tube if patient doesn't improve with medical thearpy. 2. Zosyn 3. IVF - 2L bolus in ED and NS at 75 4. NPO except chips and sips, meds 2. Hyponatremia - 1. Suspect dehydration 2. IVF as above 3. Repeat BMP now and CMP in AM 4. Checking urine sodium / osm 3. Pancreatic CA with liver mets - 1. Had been supposed to start pal radiation tomorrow 2. But will now be delayed obviously.  DVT prophylaxis: Lovenox Code Status: DNR/DNI Family Communication: Husband at bedside Disposition Plan: Home after admit Consults called: Gen surg as above Admission status: Admit to inpatient - IP status for: 1) Acute cholecystitis treatment 2) hyponatremia due to essentially no PO intake and not tolerating POs   Etta Quill DO [facility] Pager [phone] Only works nights!  If 7AM-7PM, please contact the primary day team physician taking care of patient  [email or web address] Password [email or web address]  05/08/2018, 8:25 PM

## 2018-05-08 NOTE — ED Triage Notes (Signed)
-  Patient arrived via [facility] from home. She has a Hx of pancreatic CA, Diagnosed over 1 year ago, supposed to start radiation therapy tomorrow.  -Thursday, Friday, noticed swelling, weakness, pain in abdomen, was taking Advil for pain and it was helping, since last night Advil and Tramadol have not touched her pain. Pateitn rates her Abdominal pain and swelling, rates pain 10/10. Pain with palpation.   Vitals  -BP 104/54

## 2018-05-09 ENCOUNTER — Encounter (HOSPITAL_COMMUNITY): Payer: Self-pay | Admitting: Interventional Radiology

## 2018-05-09 ENCOUNTER — Inpatient Hospital Stay (HOSPITAL_COMMUNITY): Payer: Medicare Other

## 2018-05-09 ENCOUNTER — Ambulatory Visit: Payer: Medicare Other | Admitting: Radiation Oncology

## 2018-05-09 ENCOUNTER — Other Ambulatory Visit: Payer: Self-pay

## 2018-05-09 HISTORY — PX: IR PERC CHOLECYSTOSTOMY: IMG2326

## 2018-05-09 LAB — COMPREHENSIVE METABOLIC PANEL
ALBUMIN: 2.3 g/dL — AB (ref 3.5–5.0)
ALT: 20 U/L (ref 0–44)
AST: 41 U/L (ref 15–41)
Alkaline Phosphatase: 180 U/L — ABNORMAL HIGH (ref 38–126)
Anion gap: 11 (ref 5–15)
BUN: 12 mg/dL (ref 8–23)
CHLORIDE: 102 mmol/L (ref 98–111)
CO2: 20 mmol/L — AB (ref 22–32)
Calcium: 7.8 mg/dL — ABNORMAL LOW (ref 8.9–10.3)
Creatinine, Ser: 0.63 mg/dL (ref 0.44–1.00)
GFR calc Af Amer: >60 mL/min (ref >60)
GFR calc non Af Amer: >60 mL/min (ref >60)
GLUCOSE: 63 mg/dL — AB (ref 70–99)
POTASSIUM: 3.7 mmol/L (ref 3.5–5.1)
SODIUM: 133 mmol/L — AB (ref 135–145)
Total Bilirubin: 1.1 mg/dL (ref 0.3–1.2)
Total Protein: 5.3 g/dL — ABNORMAL LOW (ref 6.5–8.1)

## 2018-05-09 LAB — CBC
HCT: 27.7 % — ABNORMAL LOW (ref 36.0–46.0)
Hemoglobin: 9.3 g/dL — ABNORMAL LOW (ref 12.0–15.0)
MCH: 29.6 pg (ref 26.0–34.0)
MCHC: 33.6 g/dL (ref 30.0–36.0)
MCV: 88.2 fL (ref 78.0–100.0)
PLATELETS: 332 10*3/uL (ref 150–400)
RBC: 3.14 MIL/uL — AB (ref 3.87–5.11)
RDW: 13.7 % (ref 11.5–15.5)
WBC: 9.5 10*3/uL (ref 4.0–10.5)

## 2018-05-09 LAB — OSMOLALITY, URINE: Osmolality, Ur: 121 mOsm/kg — ABNORMAL LOW (ref 300–900)

## 2018-05-09 LAB — PROTIME-INR
INR: 1.61
Prothrombin Time: 19 seconds — ABNORMAL HIGH (ref 11.4–15.2)

## 2018-05-09 MED ORDER — LIDOCAINE HCL (PF) 1 % IJ SOLN
INTRAMUSCULAR | Status: AC | PRN
  Administered 2018-05-09: 5 mL

## 2018-05-09 MED ORDER — DEXTROSE-NACL 5-0.45 % IV SOLN
INTRAVENOUS | Status: DC
  Administered 2018-05-09 – 2018-05-10 (×2): via INTRAVENOUS

## 2018-05-09 MED ORDER — DIPHENHYDRAMINE HCL 25 MG PO CAPS
25.0000 mg | ORAL_CAPSULE | Freq: Four times a day (QID) | ORAL | Status: DC | PRN
  Administered 2018-05-09: 25 mg via ORAL
  Filled 2018-05-09: qty 1

## 2018-05-09 MED ORDER — FENTANYL CITRATE (PF) 100 MCG/2ML IJ SOLN
INTRAMUSCULAR | Status: AC | PRN
  Administered 2018-05-09 (×2): 25 ug via INTRAVENOUS

## 2018-05-09 MED ORDER — LIP MEDEX EX OINT
TOPICAL_OINTMENT | CUTANEOUS | Status: AC
  Administered 2018-05-09: 17:00:00
  Filled 2018-05-09: qty 7

## 2018-05-09 MED ORDER — SODIUM CHLORIDE 0.9 % IV SOLN
INTRAVENOUS | Status: DC | PRN
  Administered 2018-05-09: 250 mL via INTRAVENOUS

## 2018-05-09 MED ORDER — PIPERACILLIN-TAZOBACTAM 3.375 G IVPB
INTRAVENOUS | Status: AC
  Administered 2018-05-09: 3.375 g via INTRAVENOUS
  Filled 2018-05-09: qty 50

## 2018-05-09 MED ORDER — MIDAZOLAM HCL 2 MG/2ML IJ SOLN
INTRAMUSCULAR | Status: AC | PRN
  Administered 2018-05-09 (×2): 0.5 mg via INTRAVENOUS

## 2018-05-09 MED ORDER — SODIUM CHLORIDE 0.9% FLUSH
5.0000 mL | Freq: Three times a day (TID) | INTRAVENOUS | Status: DC
  Administered 2018-05-10 – 2018-05-24 (×41): 5 mL

## 2018-05-09 MED ORDER — FENTANYL CITRATE (PF) 100 MCG/2ML IJ SOLN
INTRAMUSCULAR | Status: AC
  Filled 2018-05-09: qty 2

## 2018-05-09 MED ORDER — MIDAZOLAM HCL 2 MG/2ML IJ SOLN
INTRAMUSCULAR | Status: AC
  Filled 2018-05-09: qty 2

## 2018-05-09 MED ORDER — CENTRUM PO CHEW
1.0000 | CHEWABLE_TABLET | Freq: Every day | ORAL | Status: DC
  Filled 2018-05-09: qty 1

## 2018-05-09 MED ORDER — IOPAMIDOL (ISOVUE-300) INJECTION 61%
50.0000 mL | Freq: Once | INTRAVENOUS | Status: AC | PRN
  Administered 2018-05-09: 15 mL

## 2018-05-09 MED ORDER — IOPAMIDOL (ISOVUE-300) INJECTION 61%
INTRAVENOUS | Status: AC
  Administered 2018-05-09: 15 mL
  Filled 2018-05-09: qty 50

## 2018-05-09 MED ORDER — LIDOCAINE HCL 1 % IJ SOLN
INTRAMUSCULAR | Status: AC
  Filled 2018-05-09: qty 20

## 2018-05-09 MED ORDER — ENOXAPARIN SODIUM 30 MG/0.3ML ~~LOC~~ SOLN
30.0000 mg | Freq: Every day | SUBCUTANEOUS | Status: DC
  Administered 2018-05-10 – 2018-05-12 (×3): 30 mg via SUBCUTANEOUS
  Filled 2018-05-09 (×3): qty 0.3

## 2018-05-09 NOTE — Progress Notes (Signed)
PROGRESS NOTE  Summer Jones MHD:[id number] DOB: 04-27-1930 DOA: 05/08/2018 PCP: Crist Infante, MD  HPI/Recap of past 14 hours: 84 year old female with history of gastric outlet obstruction status post gastro-jejunostomy on 11/2017, history of CBD obstruction status post biliary stents, pancreatic cancer with possible liver mets, hypertension presented to the ED with complaints of worsening RUQ abdominal pain, with associated distention, nausea, poor p.o. intake for the past 3 days.  In the ED, CT abdomen and ultrasound showed possible acute cholecystitis, small amount of ascites, with known pancreatic cancer with mets to the liver.  Patient admitted for further management.  General surgery on consult.   Today, patient still reported right upper quadrant tenderness, very severe on palpation.  Complained of chronic back pain.  Patient denied any nausea/vomiting, fever/chills, chest pain, cough.  Assessment/Plan: Principal Problem:   Acute cholecystitis Active Problems:   Hyponatremia   Essential hypertension   Pancreatic cancer metastasized to liver [facility])  ?Acute cholecystitis Afebrile, with no leukocytosis CT abdomen/pelvis showed persistent gallbladder dilatation with thickened enhancing gallbladder wall, suggesting cholecystitis General surgery consulted, noted to be a poor surgical candidate, rec IR for possible percutaneous drainage.  Recommend holding off on radiation Continue IV Zosyn Continue to be n.p.o, IV fluids  Hyponatremia Likely 2/2 dehydration Continue IVF Daily BMP  Normocytic anemia 2/2 likely malignancy Hgb stable Anemia panel  Hypothyroidism Continue synthroid  Metastatic pancreatic CA to the liver Planned to start palliative rad on 05/09/18 Hold off for now     Code Status: DNR  Family Communication: Husband at bedside  Disposition Plan: Once work up complete   Consultants:  Gen Surg  Procedures:  None  Antimicrobials:  Zosyn  DVT  prophylaxis: Lovenox   Objective: Vitals:   05/08/18 1900 05/08/18 2217 05/09/18 0500 05/09/18 1041  BP: (!) 112/56 118/88 114/83   Pulse: 93 (!) 129 76   Resp: (!) 22 16 18    Temp:  98.1 F (36.7 C) 98.3 F (36.8 C)   TempSrc:  Oral Oral   SpO2: 91% 100% 100% 95%  Weight:      Height:        Intake/Output Summary (Last 24 hours) at 05/09/2018 1158 Last data filed at 05/09/2018 0500 Gross per 24 hour  Intake 1884.54 ml  Output 2300 ml  Net -415.46 ml   Filed Weights   05/08/18 1335  Weight: 35.7 kg    Exam:   General: NAD  Cardiovascular: S1, S2 present  Respiratory: CTAB  Abdomen: Soft, distended, TTP around RUQ, BS present  Musculoskeletal: No pedal edema bilaterally  Skin: Normal  Psychiatry: Normal mood   Data Reviewed: CBC: Recent Labs  Lab 05/08/18 1429 05/08/18 1452 05/09/18 0420  WBC  --  8.1 9.5  NEUTROABS  --  7.4  --   HGB 9.9* 9.9* 9.3*  HCT 29.0* 28.9* 27.7*  MCV  --  86.8 88.2  PLT  --  301 211   Basic Metabolic Panel: Recent Labs  Lab 05/08/18 1420 05/08/18 1429 05/08/18 2052 05/09/18 0420  NA 125* 123* 133* 133*  K 4.5 4.2 3.8 3.7  CL 91* 91* 99 102  CO2 23  --  22 20*  GLUCOSE 109* 111* 84 63*  BUN 12 10 9 12   CREATININE 0.50 0.40* 0.58 0.63  CALCIUM 8.5*  --  8.2* 7.8*   GFR: Estimated Creatinine Clearance: 28.4 mL/min (by C-G formula based on SCr of 0.63 mg/dL). Liver Function Tests: Recent Labs  Lab 05/08/18 1420 05/09/18 0420  AST 29 41  ALT 21 20  ALKPHOS 223* 180*  BILITOT 1.4* 1.1  PROT 5.9* 5.3*  ALBUMIN 2.8* 2.3*   Recent Labs  Lab 05/08/18 1420  LIPASE 16   No results for input(s): AMMONIA in the last 168 hours. Coagulation Profile: No results for input(s): INR, PROTIME in the last 168 hours. Cardiac Enzymes: No results for input(s): CKTOTAL, CKMB, CKMBINDEX, TROPONINI in the last 168 hours. BNP (last 3 results) No results for input(s): PROBNP in the last 8760 hours. HbA1C: No results  for input(s): HGBA1C in the last 72 hours. CBG: No results for input(s): GLUCAP in the last 168 hours. Lipid Profile: No results for input(s): CHOL, HDL, LDLCALC, TRIG, CHOLHDL, LDLDIRECT in the last 72 hours. Thyroid Function Tests: No results for input(s): TSH, T4TOTAL, FREET4, T3FREE, THYROIDAB in the last 72 hours. Anemia Panel: No results for input(s): VITAMINB12, FOLATE, FERRITIN, TIBC, IRON, RETICCTPCT in the last 72 hours. Urine analysis:    Component Value Date/Time   COLORURINE STRAW (A) 05/08/2018 1400   APPEARANCEUR CLEAR 05/08/2018 1400   LABSPEC 1.005 05/08/2018 1400   PHURINE 6.0 05/08/2018 1400   GLUCOSEU NEGATIVE 05/08/2018 1400   HGBUR NEGATIVE 05/08/2018 1400   BILIRUBINUR NEGATIVE 05/08/2018 1400   KETONESUR NEGATIVE 05/08/2018 1400   PROTEINUR NEGATIVE 05/08/2018 1400   NITRITE NEGATIVE 05/08/2018 1400   LEUKOCYTESUR NEGATIVE 05/08/2018 1400   Sepsis Labs: @LABRCNTIP (procalcitonin:4,lacticidven:4)  )No results found for this or any previous visit (from the past 240 hour(s)).    Studies: Ct Abdomen Pelvis W Contrast  Result Date: 05/08/2018 CLINICAL DATA:  Pancreatic adenocarcinoma. Abdominal pain Distention. Gastrojejunostomy created 11/27/2016. EXAM: CT ABDOMEN AND PELVIS WITH CONTRAST TECHNIQUE: Multidetector CT imaging of the abdomen and pelvis was performed using the standard protocol following bolus administration of intravenous contrast. CONTRAST:  18mL ISOVUE-300 IOPAMIDOL (ISOVUE-300) INJECTION 61% COMPARISON:  03/29/2018 and previous FINDINGS: Lower chest: Trace pleural effusions right greater than left, new since previous. Minimal dependent atelectasis posteriorly in the right lung base. Moderate hiatal hernia. Hepatobiliary: Stable metallic biliary stent. No intrahepatic biliary ductal dilatation. Thick-walled gallbladder with mucosal enhancement. Small low-attenuation liver lesions. 8 mm low-attenuation lesion in the inferior right lobe, image 28/2,  not present on prior studies. Ill-defined sub 7 mm low-attenuation focus in segment 8 image 12/2, not evident on prior studies. Pancreas: Dilatation of pancreatic duct if parenchymal atrophy. Little convincing change in the ill-defined soft tissue attenuation in the region the pancreatic head, margins indistinct. Spleen: Normal in size without focal abnormality. Adrenals/Urinary Tract: Normal adrenal glands. Unremarkable kidneys. Marked distention of the urinary bladder. Stomach/Bowel: Moderate hiatal hernia. Changes of gastrojejunostomy. Stomach is nondilated. Small bowel nondilated. Appendix not identified. Colon decompressed, unremarkable. Vascular/Lymphatic: Mild scattered calcified atheromatous plaque. No aortic aneurysm. High-grade tapered narrowing near the portosplenic confluence. Reproductive: Uterus and bilateral adnexa are unremarkable. Other: New scattered mild perihepatic, perisplenic, and pelvic ascites. No free air. Musculoskeletal: Lumbar levoscoliosis with multilevel spondylitic change. Grade 1 anterolisthesis L5-S1 probably secondary to facet DJD. No fracture or worrisome bone lesion. IMPRESSION: 1. Small amount of abdominal and pelvic ascites, new since previous. 2. Marked distention of the urinary bladder 3. New small bilateral pleural effusions 4. Persistent gallbladder dilatation with thickened enhancing gallbladder wall, suggesting cholecystitis. 5. New low-attenuation liver lesions suggesting metastatic disease. Electronically Signed   By: Lucrezia Europe M.D.   On: 05/08/2018 16:37   US Abdomen Limited Ruq  Result Date: 05/08/2018 CLINICAL DATA:  84 year old female with RIGHT UPPER quadrant abdominal pain and  nausea for 3 days. History of pancreatic cancer. EXAM: ULTRASOUND ABDOMEN LIMITED RIGHT UPPER QUADRANT COMPARISON:  05/08/2018 CT FINDINGS: Gallbladder: Distended gallbladder with marked wall thickening noted. Gallbladder sludge identified. A positive sonographic Percell Miller sign is noted as  well as a small amount of pericholecystic fluid. Common bile duct: Diameter: 6 mm. Liver: No definite focal abnormalities. The abnormalities identified on CT are not well visualized sonographically. Portal vein is patent on color Doppler imaging with normal direction of blood flow towards the liver. Small amount of ascites noted. IMPRESSION: Distended gallbladder with gallbladder wall thickening, pericholecystic fluid and positive sonographic Murphy sign compatible with acute cholecystitis. No biliary dilatation. Small amount of ascites. Electronically Signed   By: Margarette Canada M.D.   On: 05/08/2018 17:48    Scheduled Meds: . cholecalciferol  800 Units Oral Daily  . enoxaparin (LOVENOX) injection  30 mg Subcutaneous QHS  . levothyroxine  25 mcg Oral QAC breakfast  . multivitamin-iron-minerals-folic acid  1 tablet Oral Daily    Continuous Infusions: . sodium chloride Stopped (05/09/18 0039)  . sodium chloride Stopped (05/09/18 0208)  . piperacillin-tazobactam (ZOSYN)  IV 3.375 g (05/09/18 1022)     LOS: 1 day     Alma Friendly, MD [facility]   If 7PM-7AM, please contact night-coverage [email or web address] 05/09/2018, 11:58 AM

## 2018-05-09 NOTE — Progress Notes (Signed)
MEDICATION-RELATED CONSULT NOTE   IR Procedure Consult - Anticoagulant/Antiplatelet PTA/Inpatient Med List Review by Pharmacist    Procedure: Image guided Gallbladder tube placement    Completed: 05/09/18 @ 17:51  Post-Procedural bleeding risk per IR MD assessment:  STANDARD  Antithrombotic medications on inpatient or PTA profile prior to procedure:   Enoxaparin 30mg  sq q24h (last dose given 05/08/18 @ 23:42)    Recommended restart time per IR Post-Procedure Guidelines:  Next Day   Plan:     Orders already placed to resume Enoxaparin 30mg  sq q24h on 05/10/18 @ 22:00  Leone Haven, PharmD

## 2018-05-09 NOTE — Consult Note (Signed)
, [facility] Consult/Admission Note  Summer Jones 05/20/1930  [id number].    Requesting MD: Dr. Horris Latino Chief Complaint/Reason for Consult: possible cholecystitis   HPI:   Pt is a 84 yo female with a history of GOO S/P gastrogenjunostomy 11/2017 by Dr. [name], hx of biliary stent, pancreatic cancer with possible liver mets, HTN who presented to the ED with complaints of abdominal pain. Pt states she has had abdominal pain for 3 years. This pain is a little different, RUQ radiating into her back, constant, severe. Her abdominal pain has never been this bad before. Associated bloating. No nausea or vomiting. No BM or flatus since admission. Pt is supposed to start radiation therapy today. Husband at bedside. He does not think that she is up for getting radiation today.  CT showed small amt of new ascites, gallbladder dilatation with thickened enhancing wall, new liver lesions suggesting metastatic disease.  US showed Distended gallbladder with gallbladder wall thickening, pericholecystic fluid and positive sonographic Murphy sign Labs: WBC 9.5, alk phos 180, Tprotein 5.3, calcium 7.8  ROS:  Review of Systems  Constitutional: Negative for chills, diaphoresis and fever.  HENT: Negative for sore throat.   Respiratory: Negative for cough and shortness of breath.   Cardiovascular: Negative for chest pain.  Gastrointestinal: Positive for abdominal pain. Negative for blood in stool, constipation, diarrhea, nausea and vomiting.  Genitourinary: Negative for dysuria.  Skin: Negative for rash.  Neurological: Negative for dizziness and loss of consciousness.  All other systems reviewed and are negative.    Family History  Problem Relation Age of Onset  . Breast cancer Sister 29       treated with radiation  . Heart disease Father   . Colon cancer Neg Hx     Past Medical History:  Diagnosis Date  . Arthritis   . Common bile duct (CBD) obstruction   . Difficult intubation  Anterior glottis, short TMD, limited mouth opening. Required glidescope and bougie stylet with multiple attempts.  . Diverticular disease   . Diverticulosis   . Essential hypertension   . Hypertension   . Hypothyroidism   . Jaundice   . Mitral valve prolapse   . Pancreatic cancer ([place]) 05/2017  . Thyroid disease     Past Surgical History:  Procedure Laterality Date  . BREAST BIOPSY Right   . BREAST CYST EXCISION Right   . ENDOSCOPIC RETROGRADE CHOLANGIOPANCREATOGRAPHY (ERCP) WITH PROPOFOL N/A 08/08/2015   Procedure: ENDOSCOPIC RETROGRADE CHOLANGIOPANCREATOGRAPHY (ERCP) WITH PROPOFOL;  Surgeon: [name], MD;  Location: [facility];  Service: Endoscopy;  Laterality: N/A;  . 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 ESOPHAGOGASTRODUODENOSCOPY (EGD) WITH PROPOFOL N/A 11/25/2016   Procedure: ESOPHAGOGASTRODUODENOSCOPY (EGD) WITH PROPOFOL;  Surgeon: [name], MD;  Location: [facility];  Service: Endoscopy;  Laterality: N/A;  . EUS N/A 08/08/2015   Procedure: UPPER ENDOSCOPIC ULTRASOUND (EUS) RADIAL;  Surgeon: [name], MD;  Location: [facility];  Service: Endoscopy;  Laterality: N/A;  . INGUINAL HERNIA REPAIR     right  . LAPAROTOMY N/A 11/27/2016   Procedure: GASTROJEJUNOSTOMY;  Surgeon: Leighton Ruff, MD;  Location: [facility];  Service: General;  Laterality: N/A;  . PANCREAS BIOPSY  11/27/2016   Procedure: PANCREACTIC BIOPSY;  Surgeon: Leighton Ruff, MD;  Location: [facility];  Service: General;;  . Bess Kinds CHOLANGIOSCOPY N/A 12/26/2015   Procedure: [id number] CHOLANGIOSCOPY;  Surgeon: [name], MD;  Location: [facility];  Service: Endoscopy;  Laterality: N/A;  . TONSILLECTOMY      Social History:  reports  that she quit smoking about 62 years ago. Her smoking use included cigarettes. She has a 10.00 pack-year smoking history. She has never used smokeless tobacco. She reports that she does not drink alcohol or use drugs.  Allergies:  Allergies  Allergen Reactions  . Sulfamethoxazole Nausea Only    Medications Prior to Admission  Medication Sig Dispense Refill  . amLODipine (NORVASC) 10 MG tablet Take 10 mg by mouth at bedtime.     . hydrocortisone (ANUSOL-HC) 2.5 % rectal cream Place 1 application rectally 2 (two) times daily as needed for hemorrhoids or anal itching. 30 g 0  . ibuprofen (ADVIL,MOTRIN) 100 MG chewable tablet Chew 200 mg by mouth daily as needed for mild pain.    Marland Kitchen levothyroxine (SYNTHROID, LEVOTHROID) 25 MCG tablet Take 25 mcg by mouth daily before breakfast.    . lipase/protease/amylase (CREON) 12000 units CPEP capsule Take 2 capsules (24,000 Units total) by mouth 3 (three) times daily before meals. (Patient taking differently: Take 24,000 Units by mouth 2 (two) times daily. ) 540 capsule 1  . lisinopril (PRINIVIL,ZESTRIL) 5 MG tablet Take 5 mg by mouth at bedtime.     Marland Kitchen LORazepam (ATIVAN) 0.5 MG tablet Take 0.5 tablets (0.25 mg total) by mouth every 6 (six) hours as needed for anxiety (or nausea). 30 tablet 0  . Multiple Vitamins-Minerals (MULTIVITAMIN GUMMIES ADULT) CHEW Chew 1 tablet by mouth daily.    . polyethylene glycol powder (GLYCOLAX/MIRALAX) powder Take 17 g by mouth daily as needed for mild constipation.     . traMADol (ULTRAM) 50 MG tablet Take 1 tablet (50 mg total) by mouth every 6 (six) hours as needed. (Patient taking differently: Take 50 mg by mouth every 6 (six) hours as needed for moderate pain. ) 30 tablet 1  . Cholecalciferol (VITAMIN D3) 400 units CAPS Take 2 capsules by mouth daily.      Blood pressure 114/83, pulse 76, temperature 98.3 F (36.8 C), temperature source Oral, resp. rate 18, height 4' 11.49" (1.511 m), weight 35.7 kg, SpO2 100  %.  Physical Exam  Constitutional: She is oriented to person, place, and time. Vital signs are normal.  Non-toxic appearance. She has a sickly appearance. No distress.  Thin, frail elderly woman who appears uncomfortable  HENT:  Head: Normocephalic and atraumatic.  Nose: Nose normal.  Mouth/Throat: Mucous membranes are not pale and dry.  Eyes: Pupils are equal, round, and reactive to light. Conjunctivae are normal. Right eye exhibits no discharge. Left eye exhibits no discharge. No scleral icterus.  Neck: Normal range of motion. Neck supple. No thyromegaly present.  Cardiovascular: Normal rate, regular rhythm, normal heart sounds and intact distal pulses. Exam reveals no gallop and no friction rub.  No murmur heard. Pulses:      Radial pulses are 2+ on the right side, and 2+ on the left side.       Dorsalis pedis pulses are 2+ on the right side, and 2+ on the left side.  Pulmonary/Chest: Effort normal and breath sounds normal. No respiratory distress. She has no decreased breath sounds. She has no wheezes. She has no rhonchi. She has no rales.  Abdominal: Soft. Normal appearance and bowel sounds are normal. She exhibits distension (mild). She exhibits no mass. There  is no hepatosplenomegaly. There is tenderness in the right upper quadrant, right lower quadrant and epigastric area. There is guarding. There is no rigidity and no rebound.  Musculoskeletal: Normal range of motion. She exhibits no edema, tenderness or deformity.  Neurological: She is alert and oriented to person, place, and time.  Skin: Skin is warm and dry. No rash noted. She is not diaphoretic.  Psychiatric: She has a normal mood and affect.  Nursing note and vitals reviewed.   Results for orders placed or performed during the hospital encounter of 05/08/18 (from the past 48 hour(s))  Urinalysis, Routine w reflex microscopic     Status: Abnormal   Collection Time: 05/08/18  2:00 PM  Result Value Ref Range   Color, Urine  STRAW (A) YELLOW   APPearance CLEAR CLEAR   Specific Gravity, Urine 1.005 1.005 - 1.030   pH 6.0 5.0 - 8.0   Glucose, UA NEGATIVE NEGATIVE mg/dL   Hgb urine dipstick NEGATIVE NEGATIVE   Bilirubin Urine NEGATIVE NEGATIVE   Ketones, ur NEGATIVE NEGATIVE mg/dL   Protein, ur NEGATIVE NEGATIVE mg/dL   Nitrite NEGATIVE NEGATIVE   Leukocytes, UA NEGATIVE NEGATIVE    Comment: Performed at [facility] [address], [place], [place] [address]  Comprehensive metabolic panel     Status: Abnormal   Collection Time: 05/08/18  2:20 PM  Result Value Ref Range   Sodium 125 (L) 135 - 145 mmol/L   Potassium 4.5 3.5 - 5.1 mmol/L   Chloride 91 (L) 98 - 111 mmol/L   CO2 23 22 - 32 mmol/L   Glucose, Bld 109 (H) 70 - 99 mg/dL   BUN 12 8 - 23 mg/dL   Creatinine, Ser 0.50 0.44 - 1.00 mg/dL   Calcium 8.5 (L) 8.9 - 10.3 mg/dL   Total Protein 5.9 (L) 6.5 - 8.1 g/dL   Albumin 2.8 (L) 3.5 - 5.0 g/dL   AST 29 15 - 41 U/L   ALT 21 0 - 44 U/L   Alkaline Phosphatase 223 (H) 38 - 126 U/L   Total Bilirubin 1.4 (H) 0.3 - 1.2 mg/dL   GFR calc non Af Amer >60 >60 mL/min   GFR calc Af Amer >60 >60 mL/min    Comment: (NOTE) The eGFR has been calculated using the CKD EPI equation. This calculation has not been validated in all clinical situations. eGFR's persistently <60 mL/min signify possible Chronic Kidney Disease.    Anion gap 11 5 - 15    Comment: Performed at [facility], [place] [address], [place], [place] [address]  Lipase, blood     Status: None   Collection Time: 05/08/18  2:20 PM  Result Value Ref Range   Lipase 16 11 - 51 U/L    Comment: Performed at [facility], [place] [address] [place], [place] [address]  I-Stat CG4 Lactic Acid, ED     Status: None   Collection Time: 05/08/18  2:27 PM  Result Value Ref Range   Lactic Acid, Venous 1.18 0.5 - 1.9 mmol/L  I-Stat Chem 8, ED     Status: Abnormal   Collection Time: 05/08/18  2:29 PM  Result  Value Ref Range   Sodium 123 (L) 135 - 145 mmol/L   Potassium 4.2 3.5 - 5.1 mmol/L   Chloride 91 (L) 98 - 111 mmol/L   BUN 10 8 - 23 mg/dL   Creatinine, Ser 0.40 (L) 0.44 - 1.00 mg/dL   Glucose, Bld 111 (H)  70 - 99 mg/dL   Calcium, Ion 1.08 (L) 1.15 - 1.40 mmol/L   TCO2 23 22 - 32 mmol/L   Hemoglobin 9.9 (L) 12.0 - 15.0 g/dL   HCT 29.0 (L) 36.0 - 46.0 %  CBC with Differential/Platelet     Status: Abnormal   Collection Time: 05/08/18  2:52 PM  Result Value Ref Range   WBC 8.1 4.0 - 10.5 K/uL   RBC 3.33 (L) 3.87 - 5.11 MIL/uL   Hemoglobin 9.9 (L) 12.0 - 15.0 g/dL   HCT 28.9 (L) 36.0 - 46.0 %   MCV 86.8 78.0 - 100.0 fL   MCH 29.7 26.0 - 34.0 pg   MCHC 34.3 30.0 - 36.0 g/dL   RDW 13.6 11.5 - 15.5 %   Platelets 301 150 - 400 K/uL   Neutrophils Relative % 92 %   Neutro Abs 7.4 1.7 - 7.7 K/uL   Lymphocytes Relative 3 %   Lymphs Abs 0.2 (L) 0.7 - 4.0 K/uL   Monocytes Relative 5 %   Monocytes Absolute 0.4 0.1 - 1.0 K/uL   Eosinophils Relative 0 %   Eosinophils Absolute 0.0 0.0 - 0.7 K/uL   Basophils Relative 0 %   Basophils Absolute 0.0 0.0 - 0.1 K/uL    Comment: Performed at [facility], [place] [address] [place], [place] [address]  Sodium, urine, random     Status: None   Collection Time: 05/08/18  7:12 PM  Result Value Ref Range   Sodium, Ur <10 mmol/L    Comment: Performed at [facility], [place] [address] [place], [place] [address]  Osmolality, urine     Status: Abnormal   Collection Time: 05/08/18  7:12 PM  Result Value Ref Range   Osmolality, Ur 121 (L) 300 - 900 mOsm/kg    Comment: Performed at [facility] [address] [place], [place] [address]  Basic metabolic panel     Status: Abnormal   Collection Time: 05/08/18  8:52 PM  Result Value Ref Range   Sodium 133 (L) 135 - 145 mmol/L    Comment: DELTA CHECK NOTED   Potassium 3.8 3.5 - 5.1 mmol/L   Chloride 99 98 - 111 mmol/L   CO2 22 22 - 32 mmol/L   Glucose, Bld 84 70 -  99 mg/dL   BUN 9 8 - 23 mg/dL   Creatinine, Ser 0.58 0.44 - 1.00 mg/dL   Calcium 8.2 (L) 8.9 - 10.3 mg/dL   GFR calc non Af Amer >60 >60 mL/min   GFR calc Af Amer >60 >60 mL/min    Comment: (NOTE) The eGFR has been calculated using the CKD EPI equation. This calculation has not been validated in all clinical situations. eGFR's persistently <60 mL/min signify possible Chronic Kidney Disease.    Anion gap 12 5 - 15    Comment: Performed at [facility], [place] [address], [place],  [address]  CBC     Status: Abnormal   Collection Time: 05/09/18  4:20 AM  Result Value Ref Range   WBC 9.5 4.0 - 10.5 K/uL   RBC 3.14 (L) 3.87 - 5.11 MIL/uL   Hemoglobin 9.3 (L) 12.0 - 15.0 g/dL   HCT 27.7 (L) 36.0 - 46.0 %   MCV 88.2 78.0 - 100.0 fL   MCH 29.6 26.0 - 34.0 pg   MCHC 33.6 30.0 - 36.0 g/dL   RDW 13.7 11.5 - 15.5 %   Platelets 332 150 - 400  K/uL    Comment: Performed at [facility], [place] [address], [place], [place] [address]  Comprehensive metabolic panel     Status: Abnormal   Collection Time: 05/09/18  4:20 AM  Result Value Ref Range   Sodium 133 (L) 135 - 145 mmol/L   Potassium 3.7 3.5 - 5.1 mmol/L   Chloride 102 98 - 111 mmol/L   CO2 20 (L) 22 - 32 mmol/L   Glucose, Bld 63 (L) 70 - 99 mg/dL   BUN 12 8 - 23 mg/dL   Creatinine, Ser 0.63 0.44 - 1.00 mg/dL   Calcium 7.8 (L) 8.9 - 10.3 mg/dL   Total Protein 5.3 (L) 6.5 - 8.1 g/dL   Albumin 2.3 (L) 3.5 - 5.0 g/dL   AST 41 15 - 41 U/L   ALT 20 0 - 44 U/L   Alkaline Phosphatase 180 (H) 38 - 126 U/L   Total Bilirubin 1.1 0.3 - 1.2 mg/dL   GFR calc non Af Amer >60 >60 mL/min   GFR calc Af Amer >60 >60 mL/min    Comment: (NOTE) The eGFR has been calculated using the CKD EPI equation. This calculation has not been validated in all clinical situations. eGFR's persistently <60 mL/min signify possible Chronic Kidney Disease.    Anion gap 11 5 - 15    Comment: Performed at [facility], [place] [address] [place], [place] [address]   Ct Abdomen Pelvis W Contrast  Result Date: 05/08/2018 CLINICAL DATA:  Pancreatic adenocarcinoma. Abdominal pain Distention. Gastrojejunostomy created 11/27/2016. EXAM: CT ABDOMEN AND PELVIS WITH CONTRAST TECHNIQUE: Multidetector CT imaging of the abdomen and pelvis was performed using the standard protocol following bolus administration of intravenous contrast. CONTRAST:  157m ISOVUE-300 IOPAMIDOL (ISOVUE-300) INJECTION 61% COMPARISON:  03/29/2018 and previous FINDINGS: Lower chest: Trace pleural effusions right greater than left, new since previous. Minimal dependent atelectasis posteriorly in the right lung base. Moderate hiatal hernia. Hepatobiliary: Stable metallic biliary stent. No intrahepatic biliary ductal dilatation. Thick-walled gallbladder with mucosal enhancement. Small low-attenuation liver lesions. 8 mm low-attenuation lesion in the inferior right lobe, image 28/2, not present on prior studies. Ill-defined sub 7 mm low-attenuation focus in segment 8 image 12/2, not evident on prior studies. Pancreas: Dilatation of pancreatic duct if parenchymal atrophy. Little convincing change in the ill-defined soft tissue attenuation in the region the pancreatic head, margins indistinct. Spleen: Normal in size without focal abnormality. Adrenals/Urinary Tract: Normal adrenal glands. Unremarkable kidneys. Marked distention of the urinary bladder. Stomach/Bowel: Moderate hiatal hernia. Changes of gastrojejunostomy. Stomach is nondilated. Small bowel nondilated. Appendix not identified. Colon decompressed, unremarkable. Vascular/Lymphatic: Mild scattered calcified atheromatous plaque. No aortic aneurysm. High-grade tapered narrowing near the portosplenic confluence. Reproductive: Uterus and bilateral adnexa are unremarkable. Other: New scattered mild perihepatic, perisplenic, and pelvic ascites. No free air. Musculoskeletal: Lumbar levoscoliosis with  multilevel spondylitic change. Grade 1 anterolisthesis L5-S1 probably secondary to facet DJD. No fracture or worrisome bone lesion. IMPRESSION: 1. Small amount of abdominal and pelvic ascites, new since previous. 2. Marked distention of the urinary bladder 3. New small bilateral pleural effusions 4. Persistent gallbladder dilatation with thickened enhancing gallbladder wall, suggesting cholecystitis. 5. New low-attenuation liver lesions suggesting metastatic disease. Electronically Signed   By: DLucrezia EuropeM.D.   On: 05/08/2018 16:37   [place]Abdomen Limited Ruq  Result Date: 05/08/2018 CLINICAL DATA:  84year old female with RIGHT UPPER quadrant abdominal pain and nausea for 3 days. History of pancreatic cancer. EXAM: ULTRASOUND ABDOMEN LIMITED RIGHT UPPER  QUADRANT COMPARISON:  05/08/2018 CT FINDINGS: Gallbladder: Distended gallbladder with marked wall thickening noted. Gallbladder sludge identified. A positive sonographic Percell Miller sign is noted as well as a small amount of pericholecystic fluid. Common bile duct: Diameter: 6 mm. Liver: No definite focal abnormalities. The abnormalities identified on CT are not well visualized sonographically. Portal vein is patent on color Doppler imaging with normal direction of blood flow towards the liver. Small amount of ascites noted. IMPRESSION: Distended gallbladder with gallbladder wall thickening, pericholecystic fluid and positive sonographic Murphy sign compatible with acute cholecystitis. No biliary dilatation. Small amount of ascites. Electronically Signed   By: Margarette Canada M.D.   On: 05/08/2018 17:48      Assessment/Plan Principal Problem:   Acute cholecystitis Active Problems:   Hyponatremia   Essential hypertension   Pancreatic cancer metastasized to liver ([place])  Cholecystitis? - US showed Distended gallbladder with gallbladder wall thickening, pericholecystic fluid and positive sonographic Murphy sign - do not recommend OR at this time for this pt in  setting of pancreatic head mass  - recommend IR perc chole drain and hold off on radiation until pt is improving  FEN: NPO VTE: SCD's, lovenox ID: [id number] 09/22>> Follow up: TBD  Plan: Patient will most likely benefit from a perc chole drain. We do not recommend surgery at this time. We will follow  Thank you for the consult.   Kalman Drape, [facility] Surgery 05/09/2018, 10:23 AM Pager: [phone] Consults: [phone] Mon-Fri 7:00 am-4:30 pm Sat-Sun 7:00 am-11:30 am

## 2018-05-09 NOTE — Procedures (Signed)
Pre procedural Dx: Acute Cholelithiasis, poor operative candidate. Post procedural Dx: Same  Technically successful [place] and Fluoro guided placed of a 10 Fr drainage catheter placement into the gallbladder for acute cholecystitis. Small sample of foul smelling aspirated bile was capped and sent to the lab for analysis.  Chole tube connected to gravity bag.   EBL: Minimal  Complications: None immediate  Ronny Bacon, MD Pager #: [phone]

## 2018-05-09 NOTE — Consult Note (Signed)
Chief Complaint: Patient was seen in consultation today for acute cholecystitis  Referring Physician(s): Dr. Barry Dienes  Supervising Physician: Sandi Mariscal  Patient Status: [facility] - In-pt  History of Present Illness: Summer Jones is a 84 y.o. female with past medical history of pancreatic cancer with common bile duct obstruction. She is known to IR from [facility] in 07/2015 which was ultimately removed in 2017 after successful stents with demonstrated patency.  She has continued with intermittent treatment of her pancreatic cancer.  Recently, she was unable to tolerate chemotherapy and was planning for palliative radiation today, however admitted overnight with severe abdominal pain.  Her pain is in the RUQ and has worsened over the past 3 days.   US Abdomen 05/08/18 showed: Distended gallbladder with gallbladder wall thickening, pericholecystic fluid and positive sonographic Murphy sign compatible with acute cholecystitis. No biliary dilatation.  CT Abdomen 05/08/18 showed: 1. Small amount of abdominal and pelvic ascites, new since previous. 2. Marked distention of the urinary bladder 3. New small bilateral pleural effusions 4. Persistent gallbladder dilatation with thickened enhancing gallbladder wall, suggesting cholecystitis. 5. New low-attenuation liver lesions suggesting metastatic disease.  She was evaluated by surgery for possible acute cholecystitis however deemed to be a poor operative candidate.  IR consulted for cholecystostomy tube placement at the request of Dr. Barry Dienes.   Past Medical History:  Diagnosis Date  . Arthritis   . Common bile duct (CBD) obstruction   . Difficult intubation Anterior glottis, short TMD, limited mouth opening. Required glidescope and bougie stylet with multiple attempts.  . Diverticular disease   . Diverticulosis   . Essential hypertension   . Hypertension   . Hypothyroidism   . Jaundice   . Mitral valve prolapse   . Pancreatic cancer ([place])  05/2017  . Thyroid disease     Past Surgical History:  Procedure Laterality Date  . BREAST BIOPSY Right   . BREAST CYST EXCISION Right   . ENDOSCOPIC RETROGRADE CHOLANGIOPANCREATOGRAPHY (ERCP) WITH PROPOFOL N/A 08/08/2015   Procedure: ENDOSCOPIC RETROGRADE CHOLANGIOPANCREATOGRAPHY (ERCP) WITH PROPOFOL;  Surgeon: [name], MD;  Location: [facility];  Service: Endoscopy;  Laterality: N/A;  . 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 ESOPHAGOGASTRODUODENOSCOPY (EGD) WITH PROPOFOL N/A 11/25/2016   Procedure: ESOPHAGOGASTRODUODENOSCOPY (EGD) WITH PROPOFOL;  Surgeon: [name], MD;  Location: [facility];  Service: Endoscopy;  Laterality: N/A;  . EUS N/A 08/08/2015   Procedure: UPPER ENDOSCOPIC ULTRASOUND (EUS) RADIAL;  Surgeon: [name], MD;  Location: [facility];  Service: Endoscopy;  Laterality: N/A;  . INGUINAL HERNIA REPAIR     right  . LAPAROTOMY N/A 11/27/2016   Procedure: GASTROJEJUNOSTOMY;  Surgeon: Leighton Ruff, MD;  Location: [facility];  Service: General;  Laterality: N/A;  . PANCREAS BIOPSY  11/27/2016   Procedure: PANCREACTIC BIOPSY;  Surgeon: Leighton Ruff, MD;  Location: [facility];  Service: General;;  . Bess Kinds CHOLANGIOSCOPY N/A 12/26/2015   Procedure: CHENIDPO CHOLANGIOSCOPY;  Surgeon: [name], MD;  Location: [facility];  Service: Endoscopy;  Laterality: N/A;  . TONSILLECTOMY      Allergies: Sulfamethoxazole  Medications: Prior to Admission medications   Medication Sig Start Date End Date Taking? Authorizing Provider  amLODipine (NORVASC) 10 MG tablet Take 10 mg by mouth at bedtime.  06/16/15  Yes [provider]  hydrocortisone (ANUSOL-HC) 2.5 % rectal cream Place 1 application  rectally 2 (two) times daily as needed for hemorrhoids or anal itching. 03/22/18  Yes Alla Feeling, NP  ibuprofen (ADVIL,MOTRIN) 100 MG chewable tablet Chew 200 mg by mouth daily as needed for mild pain.   Yes [provider]  levothyroxine (SYNTHROID, LEVOTHROID) 25 MCG tablet Take 25 mcg by mouth daily before breakfast.   Yes [provider]  lipase/protease/amylase (CREON) 12000 units CPEP capsule Take 2 capsules (24,000 Units total) by mouth 3 (three) times daily before meals. Patient taking differently: Take 24,000 Units by mouth 2 (two) times daily.  07/29/17  Yes Truitt Merle, MD  lisinopril (PRINIVIL,ZESTRIL) 5 MG tablet Take 5 mg by mouth at bedtime.    Yes [provider]  LORazepam (ATIVAN) 0.5 MG tablet Take 0.5 tablets (0.25 mg total) by mouth every 6 (six) hours as needed for anxiety (or nausea). 05/02/18  Yes Hayden Pedro, PA-C  Multiple Vitamins-Minerals (MULTIVITAMIN GUMMIES ADULT) CHEW Chew 1 tablet by mouth daily.   Yes [provider]  polyethylene glycol powder (GLYCOLAX/MIRALAX) powder Take 17 g by mouth daily as needed for mild constipation.    Yes [provider]  traMADol (ULTRAM) 50 MG tablet Take 1 tablet (50 mg total) by mouth every 6 (six) hours as needed. Patient taking differently: Take 50 mg by mouth every 6 (six) hours as needed for moderate pain.  08/16/17  Yes Truitt Merle, MD  Cholecalciferol (VITAMIN D3) 400 units CAPS Take 2 capsules by mouth daily.    [provider]     Family History  Problem Relation Age of Onset  . Breast cancer Sister 33       treated with radiation  . Heart disease Father   . Colon cancer Neg Hx     Social History   Socioeconomic History  . Marital status: Married    Spouse name: Not on file  . Number of children: 2  . Years of education: Not on file  . Highest education level: Not on file  Occupational History  . Occupation: Retired  Scientific laboratory technician  . Financial  resource strain: Not on file  . Food insecurity:    Worry: Not on file    Inability: Not on file  . Transportation needs:    Medical: Not on file    Non-medical: Not on file  Tobacco Use  . Smoking status: Former Smoker    Packs/day: 1.00    Years: 10.00    Pack years: 10.00    Types: Cigarettes    Last attempt to quit: 1957    Years since quitting: 62.7  . Smokeless tobacco: Never Used  Substance and Sexual Activity  . Alcohol use: No    Comment: no recent   . Drug use: No  . Sexual activity: Not on file  Lifestyle  . Physical activity:    Days per week: Not on file    Minutes per session: Not on file  . Stress: Not on file  Relationships  . Social connections:    Talks on phone: Not on file    Gets together: Not on file    Attends religious service: Not on file    Active member of club or organization: Not on file    Attends meetings of clubs or organizations: Not on file    Relationship status: Not on file  Other Topics Concern  . Not on file  Social History Narrative  .  Not on file     Review of Systems: A 12 point ROS discussed and pertinent positives are indicated in the HPI above.  All other systems are negative.  Review of Systems  Constitutional: Positive for fatigue. Negative for fever.  Respiratory: Negative for cough and shortness of breath.   Gastrointestinal: Positive for abdominal pain and nausea. Negative for constipation, diarrhea and vomiting.  Genitourinary: Negative for dysuria and hematuria.  Musculoskeletal: Negative for back pain.  Psychiatric/Behavioral: Negative for behavioral problems and confusion.    Vital Signs: BP (!) 100/55 (BP Location: Right Arm)   Pulse 83   Temp 98.5 F (36.9 C) (Oral)   Resp 16   Ht 4' 11.49" (1.511 m)   Wt 78 lb 12.8 oz (35.7 kg)   SpO2 97%   BMI 15.66 kg/m   Physical Exam  Constitutional: She is oriented to person, place, and time. She appears well-developed.  Neck: Normal range of motion. Neck  supple.  Cardiovascular: Normal rate, regular rhythm and normal heart sounds. Exam reveals no gallop and no friction rub.  No murmur heard. Pulmonary/Chest: Effort normal and breath sounds normal. No respiratory distress.  Abdominal: Soft. There is tenderness (RUQ).  Neurological: She is alert and oriented to person, place, and time.  Skin: Skin is warm and dry.  Psychiatric: She has a normal mood and affect. Her behavior is normal. Judgment and thought content normal.  Nursing note and vitals reviewed.    MD Evaluation Airway: WNL Heart: WNL Abdomen: WNL Chest/ Lungs: WNL ASA  Classification: 3 Mallampati/Airway Score: One   Imaging: Ct Abdomen Pelvis W Contrast  Result Date: 05/08/2018 CLINICAL DATA:  Pancreatic adenocarcinoma. Abdominal pain Distention. Gastrojejunostomy created 11/27/2016. EXAM: CT ABDOMEN AND PELVIS WITH CONTRAST TECHNIQUE: Multidetector CT imaging of the abdomen and pelvis was performed using the standard protocol following bolus administration of intravenous contrast. CONTRAST:  129m ISOVUE-300 IOPAMIDOL (ISOVUE-300) INJECTION 61% COMPARISON:  03/29/2018 and previous FINDINGS: Lower chest: Trace pleural effusions right greater than left, new since previous. Minimal dependent atelectasis posteriorly in the right lung base. Moderate hiatal hernia. Hepatobiliary: Stable metallic biliary stent. No intrahepatic biliary ductal dilatation. Thick-walled gallbladder with mucosal enhancement. Small low-attenuation liver lesions. 8 mm low-attenuation lesion in the inferior right lobe, image 28/2, not present on prior studies. Ill-defined sub 7 mm low-attenuation focus in segment 8 image 12/2, not evident on prior studies. Pancreas: Dilatation of pancreatic duct if parenchymal atrophy. Little convincing change in the ill-defined soft tissue attenuation in the region the pancreatic head, margins indistinct. Spleen: Normal in size without focal abnormality. Adrenals/Urinary Tract:  Normal adrenal glands. Unremarkable kidneys. Marked distention of the urinary bladder. Stomach/Bowel: Moderate hiatal hernia. Changes of gastrojejunostomy. Stomach is nondilated. Small bowel nondilated. Appendix not identified. Colon decompressed, unremarkable. Vascular/Lymphatic: Mild scattered calcified atheromatous plaque. No aortic aneurysm. High-grade tapered narrowing near the portosplenic confluence. Reproductive: Uterus and bilateral adnexa are unremarkable. Other: New scattered mild perihepatic, perisplenic, and pelvic ascites. No free air. Musculoskeletal: Lumbar levoscoliosis with multilevel spondylitic change. Grade 1 anterolisthesis L5-S1 probably secondary to facet DJD. No fracture or worrisome bone lesion. IMPRESSION: 1. Small amount of abdominal and pelvic ascites, new since previous. 2. Marked distention of the urinary bladder 3. New small bilateral pleural effusions 4. Persistent gallbladder dilatation with thickened enhancing gallbladder wall, suggesting cholecystitis. 5. New low-attenuation liver lesions suggesting metastatic disease. Electronically Signed   By: DLucrezia EuropeM.D.   On: 05/08/2018 16:37   [place]domen Limited Ruq  Result Date: 05/08/2018 CLINICAL  DATA:  84 year old female with RIGHT UPPER quadrant abdominal pain and nausea for 3 days. History of pancreatic cancer. EXAM: ULTRASOUND ABDOMEN LIMITED RIGHT UPPER QUADRANT COMPARISON:  05/08/2018 CT FINDINGS: Gallbladder: Distended gallbladder with marked wall thickening noted. Gallbladder sludge identified. A positive sonographic Percell Miller sign is noted as well as a small amount of pericholecystic fluid. Common bile duct: Diameter: 6 mm. Liver: No definite focal abnormalities. The abnormalities identified on CT are not well visualized sonographically. Portal vein is patent on color Doppler imaging with normal direction of blood flow towards the liver. Small amount of ascites noted. IMPRESSION: Distended gallbladder with gallbladder wall  thickening, pericholecystic fluid and positive sonographic Murphy sign compatible with acute cholecystitis. No biliary dilatation. Small amount of ascites. Electronically Signed   By: Margarette Canada M.D.   On: 05/08/2018 17:48    Labs:  CBC: Recent Labs    03/15/18 1409 03/22/18 1614 05/08/18 1429 05/08/18 1452 05/09/18 0420  WBC 4.6 5.0  --  8.1 9.5  HGB 11.8 10.9* 9.9* 9.9* 9.3*  HCT 35.7 33.2* 29.0* 28.9* 27.7*  PLT 268 249  --  301 332    COAGS: Recent Labs    05/19/17 0944  INR 0.89  APTT 28    BMP: Recent Labs    03/22/18 1614 05/08/18 1420 05/08/18 1429 05/08/18 2052 05/09/18 0420  NA 134* 125* 123* 133* 133*  K 4.0 4.5 4.2 3.8 3.7  CL 100 91* 91* 99 102  CO2 22 23  --  22 20*  GLUCOSE 124* 109* 111* 84 63*  BUN '11 12 10 9 12  '$ CALCIUM 9.5 8.5*  --  8.2* 7.8*  CREATININE 0.61 0.50 0.40* 0.58 0.63  GFRNONAA >60 >60  --  >60 >60  GFRAA >60 >60  --  >60 >60    LIVER FUNCTION TESTS: Recent Labs    03/15/18 1409 03/22/18 1614 05/08/18 1420 05/09/18 0420  BILITOT 0.4 0.6 1.4* 1.1  AST 38 39 29 41  ALT 37 38 21 20  ALKPHOS 187* 229* 223* 180*  PROT 7.5 7.4 5.9* 5.3*  ALBUMIN 4.3 4.2 2.8* 2.3*    TUMOR MARKERS: No results for input(s): AFPTM, CEA, CA199, CHROMGRNA in the last 8760 hours.  Assessment and Plan: Acute Cholecystitis Patient admitted with RUQ pain.  [place] and CT consistent with acute cholecystitis.   She does have history of pancreatic cancer.  She has been unable to undergo palliative radiation as planned.  Dr. Pascal Lux has discussed case with Dr. Barry Dienes.  Plan for cholecystostomy tube placement as able.  WBC wnl (8.1).  Although note it is elevated from her baseline of 4-5.0 Tbili 1.4 Afebrile.   Risks and benefits discussed with the patient including, but not limited to bleeding, infection, gallbladder perforation, bile leak, sepsis or even death.  All of the patient's questions were answered, patient is agreeable to proceed. Consent  signed and in chart.  Thank you for this interesting consult.  I greatly enjoyed meeting Summer Jones and look forward to participating in their care.  A copy of this report was sent to the requesting provider on this date.  Electronically Signed: Docia Barrier, PA 05/09/2018, 3:08 PM   I spent a total of 40 Minutes    in face to face in clinical consultation, greater than 50% of which was counseling/coordinating care for acute cholecystitis.

## 2018-05-10 ENCOUNTER — Inpatient Hospital Stay: Payer: Medicare Other

## 2018-05-10 ENCOUNTER — Ambulatory Visit: Payer: Medicare Other | Admitting: Radiation Oncology

## 2018-05-10 ENCOUNTER — Ambulatory Visit: Payer: Medicare Other

## 2018-05-10 ENCOUNTER — Inpatient Hospital Stay: Payer: Medicare Other | Admitting: Nurse Practitioner

## 2018-05-10 DIAGNOSIS — Z7189 Other specified counseling: Secondary | ICD-10-CM

## 2018-05-10 DIAGNOSIS — Z515 Encounter for palliative care: Secondary | ICD-10-CM

## 2018-05-10 DIAGNOSIS — K810 Acute cholecystitis: Principal | ICD-10-CM

## 2018-05-10 DIAGNOSIS — G893 Neoplasm related pain (acute) (chronic): Secondary | ICD-10-CM

## 2018-05-10 LAB — BASIC METABOLIC PANEL
Anion gap: 9 (ref 5–15)
BUN: 15 mg/dL (ref 8–23)
CHLORIDE: 102 mmol/L (ref 98–111)
CO2: 22 mmol/L (ref 22–32)
CREATININE: 0.7 mg/dL (ref 0.44–1.00)
Calcium: 8.1 mg/dL — ABNORMAL LOW (ref 8.9–10.3)
GFR calc Af Amer: >60 mL/min (ref >60)
GFR calc non Af Amer: >60 mL/min (ref >60)
GLUCOSE: 192 mg/dL — AB (ref 70–99)
POTASSIUM: 3.8 mmol/L (ref 3.5–5.1)
SODIUM: 133 mmol/L — AB (ref 135–145)

## 2018-05-10 LAB — CBC WITH DIFFERENTIAL/PLATELET
Basophils Absolute: 0 10*3/uL (ref 0.0–0.1)
Basophils Relative: 0 %
EOS ABS: 0 10*3/uL (ref 0.0–0.7)
Eosinophils Relative: 0 %
HCT: 30 % — ABNORMAL LOW (ref 36.0–46.0)
HEMOGLOBIN: 10.2 g/dL — AB (ref 12.0–15.0)
LYMPHS ABS: 0.2 10*3/uL — AB (ref 0.7–4.0)
LYMPHS PCT: 1 %
MCH: 29.4 pg (ref 26.0–34.0)
MCHC: 34 g/dL (ref 30.0–36.0)
MCV: 86.5 fL (ref 78.0–100.0)
MONOS PCT: 4 %
Monocytes Absolute: 0.8 10*3/uL (ref 0.1–1.0)
NEUTROS PCT: 95 %
Neutro Abs: 17 10*3/uL — ABNORMAL HIGH (ref 1.7–7.7)
Platelets: 433 10*3/uL — ABNORMAL HIGH (ref 150–400)
RBC: 3.47 MIL/uL — AB (ref 3.87–5.11)
RDW: 13.9 % (ref 11.5–15.5)
WBC: 18 10*3/uL — AB (ref 4.0–10.5)

## 2018-05-10 LAB — IRON AND TIBC
Iron: 5 ug/dL — ABNORMAL LOW (ref 28–170)
Saturation Ratios: 3 % — ABNORMAL LOW (ref 10.4–31.8)
TIBC: 170 ug/dL — ABNORMAL LOW (ref 250–450)
UIBC: 165 ug/dL

## 2018-05-10 LAB — FERRITIN: FERRITIN: 419 ng/mL — AB (ref 11–307)

## 2018-05-10 LAB — VITAMIN B12: Vitamin B-12: 2366 pg/mL — ABNORMAL HIGH (ref 180–914)

## 2018-05-10 LAB — FOLATE: FOLATE: 14 ng/mL (ref >5.9)

## 2018-05-10 MED ORDER — POLYETHYLENE GLYCOL 3350 17 G PO PACK
17.0000 g | PACK | Freq: Every day | ORAL | Status: DC
  Administered 2018-05-10 – 2018-05-23 (×11): 17 g via ORAL
  Filled 2018-05-10 (×14): qty 1

## 2018-05-10 MED ORDER — KETOROLAC TROMETHAMINE 15 MG/ML IJ SOLN
15.0000 mg | Freq: Four times a day (QID) | INTRAMUSCULAR | Status: DC | PRN
  Administered 2018-05-10 – 2018-05-11 (×2): 15 mg via INTRAVENOUS
  Filled 2018-05-10 (×3): qty 1

## 2018-05-10 MED ORDER — DOCUSATE SODIUM 100 MG PO CAPS
100.0000 mg | ORAL_CAPSULE | Freq: Every day | ORAL | Status: DC
  Administered 2018-05-11 – 2018-05-22 (×4): 100 mg via ORAL
  Filled 2018-05-10 (×10): qty 1

## 2018-05-10 MED ORDER — SODIUM CHLORIDE 0.9 % IV SOLN
INTRAVENOUS | Status: DC
  Administered 2018-05-10 – 2018-05-14 (×6): via INTRAVENOUS

## 2018-05-10 MED ORDER — ACETAMINOPHEN 325 MG PO TABS
650.0000 mg | ORAL_TABLET | Freq: Four times a day (QID) | ORAL | Status: DC
  Administered 2018-05-11: 650 mg via ORAL
  Filled 2018-05-10: qty 2

## 2018-05-10 MED ORDER — SODIUM CHLORIDE 0.9 % IV SOLN
510.0000 mg | Freq: Once | INTRAVENOUS | Status: AC
  Administered 2018-05-10: 510 mg via INTRAVENOUS
  Filled 2018-05-10: qty 17

## 2018-05-10 MED ORDER — ENSURE ENLIVE PO LIQD
237.0000 mL | Freq: Two times a day (BID) | ORAL | Status: DC
  Administered 2018-05-10 – 2018-05-24 (×17): 237 mL via ORAL

## 2018-05-10 NOTE — Consult Note (Signed)
Consultation Note Date: 05/10/2018   Patient Name: Summer Jones  DOB: 1930/08/03  MRN: [id number]  Age / Sex: 84 y.o., female  PCP: Crist Infante, MD Referring Physician: Alma Friendly, MD  Reason for Consultation: Establishing goals of care   HPI/Patient Profile: 84 y.o. female  with past medical history of pancreatic adenocarcinoma with peripancreatic adenopathy (s/p stent placement of CBD 08/2015 no malignancy, biopsy + 11/2016, no chemo treatments, was under careful observation until recently when she made the decision to proceed with palliative radiation that was to begin 9/23) admitted on 05/08/2018 with increasing abdominal pain. CT scan revealed new liver lesions, gall bladder dilatation. Clinically she appeared worrisome for cholecystitis, but no surgical options d/t pancreatic head mass. Underwent placement of percutaneous chole drain on 9/23. Palliative medicine consulted for [place].   Clinical Assessment and Goals of Care: Met with patient and her sonPatrick Jupiter at bedside.  Patient was alert and pleasant. Able to accurately review the history of her disease.  She has 2 children and several grandchildren who are the "light of her life".  Prior to admission she was independent with ADL's. She has noticed loss of appetite and weight loss.  We discussed her illness trajectory- she notes her hope that radiation treatments will provide her some relief from chronic pain. She states that she understands "its not even guaranteed to give me more time".  Her primary GOC is to reduce her pain. When beginning to discuss her hopes, worries, and wishes regarding EOL, she became tearful and stated she preferred to think about it. She stated she only wanted to focus on happy things like "nice sunny days".  She is concerned that her nice sunny days are limited.  She has DNR in place.  We discussed pain management. She is  very reluctant to take any sort of pain medication stronger than tylenol or ibuprofen due to her fear of how it will make her feel. She has used a heating pad at home in the past.  We discussed depression and how that affects her quality of life. She acknowledges being tearful and anxious most days but says, "I can usually pull myself out of it". She declines antidepressant medicine. She does say she is confused by all the doctors visiting her. She feels she is still somewhat confused from the anaesthesia for her drain placement.    Primary Decision Maker PATIENT and her spouse    SUMMARY OF RECOMMENDATIONS   -Will schedule tylenol '650mg'$  q6hr for pain -Order k-pad to abdomen for pain -PMT will continue to follow and provide support -For now GOC are to start palliative radiation and return home- will discuss possible outpatient palliative referral as well  Code Status/Advance Care Planning:  DNR  Palliative Prophylaxis:   Frequent Pain Assessment  Prognosis:    Unable to determine  Discharge Planning: To Be Determined  Primary Diagnoses: Present on Admission: . Hyponatremia . Acute cholecystitis . Pancreatic cancer metastasized to liver ([place]) . Essential hypertension   I have reviewed the  medical record, interviewed the patient and family, and examined the patient. The following aspects are pertinent.  Past Medical History:  Diagnosis Date  . Arthritis   . Common bile duct (CBD) obstruction   . Difficult intubation Anterior glottis, short TMD, limited mouth opening. Required glidescope and bougie stylet with multiple attempts.  . Diverticular disease   . Diverticulosis   . Essential hypertension   . Hypertension   . Hypothyroidism   . Jaundice   . Mitral valve prolapse   . Pancreatic cancer ([place]) 05/2017  . Thyroid disease    Social History   Socioeconomic History  . Marital status: Married    Spouse name: Not on file  . Number of children: 2  . Years of  education: Not on file  . Highest education level: Not on file  Occupational History  . Occupation: Retired  Scientific laboratory technician  . Financial resource strain: Not on file  . Food insecurity:    Worry: Not on file    Inability: Not on file  . Transportation needs:    Medical: Not on file    Non-medical: Not on file  Tobacco Use  . Smoking status: Former Smoker    Packs/day: 1.00    Years: 10.00    Pack years: 10.00    Types: Cigarettes    Last attempt to quit: 1957    Years since quitting: 62.7  . Smokeless tobacco: Never Used  Substance and Sexual Activity  . Alcohol use: No    Comment: no recent   . Drug use: No  . Sexual activity: Not on file  Lifestyle  . Physical activity:    Days per week: Not on file    Minutes per session: Not on file  . Stress: Not on file  Relationships  . Social connections:    Talks on phone: Not on file    Gets together: Not on file    Attends religious service: Not on file    Active member of club or organization: Not on file    Attends meetings of clubs or organizations: Not on file    Relationship status: Not on file  Other Topics Concern  . Not on file  Social History Narrative  . Not on file   Family History  Problem Relation Age of Onset  . Breast cancer Sister 69       treated with radiation  . Heart disease Father   . Colon cancer Neg Hx    Scheduled Meds: . docusate sodium  100 mg Oral Daily  . enoxaparin (LOVENOX) injection  30 mg Subcutaneous QHS  . feeding supplement (ENSURE ENLIVE)  237 mL Oral BID BM  . levothyroxine  25 mcg Oral QAC breakfast  . polyethylene glycol  17 g Oral Daily  . sodium chloride flush  5 mL Intracatheter Q8H   Continuous Infusions: . sodium chloride Stopped (05/09/18 1709)  . sodium chloride 75 mL/hr at 05/10/18 1114  . piperacillin-tazobactam (ZOSYN)  IV 3.375 g (05/10/18 1210)   PRN Meds:.sodium chloride, acetaminophen **OR** acetaminophen, diphenhydrAMINE, ketorolac, LORazepam, morphine  injection, ondansetron **OR** ondansetron (ZOFRAN) IV Medications Prior to Admission:  Prior to Admission medications   Medication Sig Start Date End Date Taking? Authorizing Provider  amLODipine (NORVASC) 10 MG tablet Take 10 mg by mouth at bedtime.  06/16/15  Yes [provider]  hydrocortisone (ANUSOL-HC) 2.5 % rectal cream Place 1 application rectally 2 (two) times daily as needed for hemorrhoids or anal itching. 03/22/18  Yes  Alla Feeling, NP  ibuprofen (ADVIL,MOTRIN) 100 MG chewable tablet Chew 200 mg by mouth daily as needed for mild pain.   Yes [provider]  levothyroxine (SYNTHROID, LEVOTHROID) 25 MCG tablet Take 25 mcg by mouth daily before breakfast.   Yes [provider]  lipase/protease/amylase (CREON) 12000 units CPEP capsule Take 2 capsules (24,000 Units total) by mouth 3 (three) times daily before meals. Patient taking differently: Take 24,000 Units by mouth 2 (two) times daily.  07/29/17  Yes Truitt Merle, MD  lisinopril (PRINIVIL,ZESTRIL) 5 MG tablet Take 5 mg by mouth at bedtime.    Yes [provider]  LORazepam (ATIVAN) 0.5 MG tablet Take 0.5 tablets (0.25 mg total) by mouth every 6 (six) hours as needed for anxiety (or nausea). 05/02/18  Yes Hayden Pedro, PA-C  Multiple Vitamins-Minerals (MULTIVITAMIN GUMMIES ADULT) CHEW Chew 1 tablet by mouth daily.   Yes [provider]  polyethylene glycol powder (GLYCOLAX/MIRALAX) powder Take 17 g by mouth daily as needed for mild constipation.    Yes [provider]  traMADol (ULTRAM) 50 MG tablet Take 1 tablet (50 mg total) by mouth every 6 (six) hours as needed. Patient taking differently: Take 50 mg by mouth every 6 (six) hours as needed for moderate pain.  08/16/17  Yes Truitt Merle, MD  Cholecalciferol (VITAMIN D3) 400 units CAPS Take 2 capsules by mouth daily.    [provider]   Allergies  Allergen Reactions  . Sulfamethoxazole Nausea Only   Review of  Systems  Physical Exam  Constitutional: She is oriented to person, place, and time.  cachetic  Cardiovascular: Normal rate.  Pulmonary/Chest: Effort normal.  Neurological: She is alert and oriented to person, place, and time.  Skin: There is pallor.  Psychiatric:  labile  Nursing note and vitals reviewed.   Vital Signs: BP 94/62 (BP Location: Left Arm)   Pulse 90   Temp 98.2 F (36.8 C) (Oral)   Resp 18   Ht 4' 11.49" (1.511 m)   Wt 35.7 kg   SpO2 100%   BMI 15.66 kg/m  Pain Scale: 0-10   Pain Score: 8    SpO2: SpO2: 100 % O2 Device:SpO2: 100 % O2 Flow Rate: .O2 Flow Rate (L/min): 2 L/min  IO: Intake/output summary:   Intake/Output Summary (Last 24 hours) at 05/10/2018 1559 Last data filed at 05/10/2018 1327 Gross per 24 hour  Intake 1577.92 ml  Output 1050 ml  Net 527.92 ml    LBM: Last BM Date: 05/08/18 Baseline Weight: Weight: 35.7 kg Most recent weight: Weight: 35.7 kg     Palliative Assessment/Data: PPS: 30%     Thank you for this consult. Palliative medicine will continue to follow and assist as needed.   Time In: 1545 Time Out: 1700 Time Total: 75 minutes Greater than 50%  of this time was spent counseling and coordinating care related to the above assessment and plan.  Signed by: Mariana Kaufman, AGNP-C Palliative Medicine    Please contact Palliative Medicine Team phone at [phone] for questions and concerns.  For individual provider: See Shea Evans

## 2018-05-10 NOTE — Evaluation (Addendum)
Physical Therapy Evaluation Patient Details Name: Summer Jones MRN: [id number] DOB: [birth year] Today's Date: 05/10/2018   History of Present Illness  84 year old female with history of gastric outlet obstruction status post gastro-jejunostomy on 11/2017, history of CBD obstruction status post biliary stents, pancreatic cancer with possible liver mets, hypertension presented to the ED with complaints of worsening RUQ abdominal pain, with associated distention, nausea, poor p.o. intake for the past 3 days. Pt is post op percutaneous cholecystectomy surgery   Clinical Impression  Pt demonstrates generalized weakness and pain due to recent abdominal surgery. Pt was indep prior to being admitted into the hospital and lives at home with her husband. Pt requires min to mod assist with transfers and amb for stability and safety. Pt would benefit from continuing skilled physical therapy while in acute care prior to d/c to SNF level rehab.     Follow Up Recommendations SNF    Equipment Recommendations  Rolling walker with 5" wheels    Recommendations for Other Services       Precautions / Restrictions        Mobility  Bed Mobility Overal bed mobility: Needs Assistance Bed Mobility: Rolling;Sidelying to Sit Rolling: Mod assist Sidelying to sit: Mod assist       General bed mobility comments: Taught Pt log roll technique to move from bed to EOB. Pt required mod assist for safty and stability.  Transfers Overall transfer level: Needs assistance Equipment used: Rolling walker (2 wheeled) Transfers: Sit to/from Stand Sit to Stand: Min assist         General transfer comment: Pt required min assist for safety and stability to move from EOB to standing.   Ambulation/Gait Ambulation/Gait assistance: Min assist Gait Distance (Feet): 90 Feet Assistive device: Rolling walker (2 wheeled) Gait Pattern/deviations: Step-through pattern;Decreased stride length;Trunk flexed     General Gait  Details: Pt required verbal and tactile cues to stay within the rails of her walker. Pt required mod assist for stability and safety.  Stairs            Wheelchair Mobility    Modified Rankin (Stroke Patients Only)       Balance Overall balance assessment: Needs assistance Sitting-balance support: Bilateral upper extremity supported Sitting balance-Leahy Scale: Good Sitting balance - Comments: Pt. used bilateral UE support when sitting EOB. Pt was able to balance with no UE support when asked to do shoulder ROM.    Standing balance support: Bilateral upper extremity supported Standing balance-Leahy Scale: Fair                               Pertinent Vitals/Pain Pain Assessment: 0-10 Pain Score: 5  Pain Location: Pt reports pain "across" her abdomen where surgery was done. Pt reports not being sure how to describe her pain. Pain Descriptors / Indicators: Burning;Aching Pain Intervention(s): Monitored during session    Home Living Family/patient expects to be discharged to:: Private residence Living Arrangements: Spouse/significant other Available Help at Discharge: Family   Home Access: Stairs to enter Entrance Stairs-Rails: Can reach both Entrance Stairs-Number of Steps: 5  Home Layout: One level Home Equipment: None      Prior Function Level of Independence: Independent         Comments: Pt's son reports Pt was able to indep perform ADLs prior to her admission at the hospital.     Hand Dominance        Extremity/Trunk Assessment  Upper Extremity Assessment Upper Extremity Assessment: Generalized weakness    Lower Extremity Assessment Lower Extremity Assessment: Generalized weakness       Communication   Communication: No difficulties  Cognition Arousal/Alertness: Awake/alert Behavior During Therapy: WFL for tasks assessed/performed Overall Cognitive Status: Within Functional Limits for tasks assessed                                         General Comments General comments (skin integrity, edema, etc.): Pt reports pain associated with her surgery. Pt was taught how to log roll to decrease trunk flexion pain associated with moving from bed to standing.    Exercises     Assessment/Plan    PT Assessment Patient needs continued PT services  PT Problem List Decreased strength;Decreased mobility;Pain;Decreased activity tolerance       PT Treatment Interventions Functional mobility training;Patient/family education;Gait training;Therapeutic activities;Therapeutic exercise;Balance training;DME instruction    PT Goals (Current goals can be found in the Care Plan section)  Acute Rehab PT Goals Patient Stated Goal: Pt would like to increase mobility to compleate ADLs PT Goal Formulation: With patient/family Time For Goal Achievement: 05/25/18 Potential to Achieve Goals: Good    Frequency Min 3X/week   Barriers to discharge        Co-evaluation               AM-PAC PT "6 Clicks" Daily Activity  Outcome Measure Difficulty turning over in bed (including adjusting bedclothes, sheets and blankets)?: Unable Difficulty moving from lying on back to sitting on the side of the bed? : Unable Difficulty sitting down on and standing up from a chair with arms (e.g., wheelchair, bedside commode, etc,.)?: Unable Help needed moving to and from a bed to chair (including a wheelchair)?: Total Help needed walking in hospital room?: Total Help needed climbing 3-5 steps with a railing? : Total 6 Click Score: 6    End of Session Equipment Utilized During Treatment: Gait belt Activity Tolerance: Patient tolerated treatment well Patient left: in bed;with family/visitor present;with call bell/phone within reach   PT Visit Diagnosis: Unsteadiness on feet (R26.81);Other abnormalities of gait and mobility (R26.89);Muscle weakness (generalized) (M62.81);Pain Pain - Right/Left: Right Pain - part of body: (R  abdomen due to surgery)    Time: 1410-1445 PT Time Calculation (min) (ACUTE ONLY): 35 min   Charges:   PT Evaluation $PT Eval Moderate Complexity: 1 Mod PT Treatments $Gait Training: 8-22 mins        [employer] SPT 05/10/2018    Rolland Porter 05/10/2018, 5:25 PM

## 2018-05-10 NOTE — Progress Notes (Signed)
PROGRESS NOTE  Summer Jones TGY:[id number] DOB: October 30, 1929 DOA: 05/08/2018 PCP: Crist Infante, MD  HPI/Recap of past 69 hours: 84 year old female with history of gastric outlet obstruction status post gastro-jejunostomy on 11/2017, history of CBD obstruction status post biliary stents, pancreatic cancer with possible liver mets, hypertension presented to the ED with complaints of worsening RUQ abdominal pain, with associated distention, nausea, poor p.o. intake for the past 3 days.  In the ED, CT abdomen and ultrasound showed possible acute cholecystitis, small amount of ascites, with known pancreatic cancer with mets to the liver.  Patient admitted for further management.  General surgery on consult.   Today, patient reported abdominal pain around drain, reports strong pain meds make her confused. Concerned about having a BM. Patient denied any nausea/vomiting, fever/chills, chest pain, cough.  Assessment/Plan: Principal Problem:   Acute cholecystitis Active Problems:   Hyponatremia   Essential hypertension   Pancreatic cancer metastasized to liver (HCC)  Acute cholecystitis s/p percutaneous cholecystostomy on 05/09/18 by IR Afebrile, now with leukocytosis (?reactive from drain placement) Surgical deep wound culture pending CT abdomen/pelvis showed persistent gallbladder dilatation with thickened enhancing gallbladder wall, suggesting cholecystitis General surgery consulted, noted to be a poor surgical candidate, rec IR for possible percutaneous drainage.  Recommend holding off on radiation IR on board Continue IV Zosyn Pain management CLD, advance as tolerated  Mild hyponatremia Likely 2/2 dehydration Continue IVF Daily BMP  Normocytic anemia 2/2 likely malignancy Hgb stable Anemia panel showed iron 5, sats 3, ferritin 419, folate 14, Vit B12 2,366 S/P 1 dose of feraheme on 05/10/18  Hypothyroidism Continue synthroid  Metastatic pancreatic CA to the liver Planned to start  palliative rad on 05/09/18 Hold off for now     Code Status: DNR  Family Communication: Husband at bedside  Disposition Plan: Once significant improvement   Consultants:  Gen Surg  IR  Procedures: Percutaneous cholecystostomy on 05/09/18 by IR  Antimicrobials:  Zosyn  DVT prophylaxis: Lovenox   Objective: Vitals:   05/09/18 1720 05/09/18 2129 05/10/18 0613 05/10/18 1322  BP: (!) 88/64 (!) 84/52 95/62 94/62   Pulse: 91 96 86 90  Resp: (!) 31 15 15 18   Temp:  100.1 F (37.8 C) 98.1 F (36.7 C) 98.2 F (36.8 C)  TempSrc:  Oral Oral Oral  SpO2: 95% 92% 98% 100%  Weight:      Height:        Intake/Output Summary (Last 24 hours) at 05/10/2018 1457 Last data filed at 05/10/2018 1327 Gross per 24 hour  Intake 1577.92 ml  Output 1050 ml  Net 527.92 ml   Filed Weights   05/08/18 1335  Weight: 35.7 kg    Exam:   General: NAD  Cardiovascular: S1, S2 present  Respiratory: CTAB  Abdomen: Soft, distended, TTP around drain site, BS present  Musculoskeletal: No pedal edema bilaterally  Skin: Normal  Psychiatry: Normal mood   Data Reviewed: CBC: Recent Labs  Lab 05/08/18 1429 05/08/18 1452 05/09/18 0420 05/10/18 0320  WBC  --  8.1 9.5 18.0*  NEUTROABS  --  7.4  --  17.0*  HGB 9.9* 9.9* 9.3* 10.2*  HCT 29.0* 28.9* 27.7* 30.0*  MCV  --  86.8 88.2 86.5  PLT  --  301 332 287*   Basic Metabolic Panel: Recent Labs  Lab 05/08/18 1420 05/08/18 1429 05/08/18 2052 05/09/18 0420 05/10/18 0320  NA 125* 123* 133* 133* 133*  K 4.5 4.2 3.8 3.7 3.8  CL 91* 91* 99 102 102  CO2 23  --  22 20* 22  GLUCOSE 109* 111* 84 63* 192*  BUN 12 10 9 12 15   CREATININE 0.50 0.40* 0.58 0.63 0.70  CALCIUM 8.5*  --  8.2* 7.8* 8.1*   GFR: Estimated Creatinine Clearance: 28.4 mL/min (by C-G formula based on SCr of 0.7 mg/dL). Liver Function Tests: Recent Labs  Lab 05/08/18 1420 05/09/18 0420  AST 29 41  ALT 21 20  ALKPHOS 223* 180*  BILITOT 1.4* 1.1  PROT  5.9* 5.3*  ALBUMIN 2.8* 2.3*   Recent Labs  Lab 05/08/18 1420  LIPASE 16   No results for input(s): AMMONIA in the last 168 hours. Coagulation Profile: Recent Labs  Lab 05/09/18 1540  INR 1.61   Cardiac Enzymes: No results for input(s): CKTOTAL, CKMB, CKMBINDEX, TROPONINI in the last 168 hours. BNP (last 3 results) No results for input(s): PROBNP in the last 8760 hours. HbA1C: No results for input(s): HGBA1C in the last 72 hours. CBG: No results for input(s): GLUCAP in the last 168 hours. Lipid Profile: No results for input(s): CHOL, HDL, LDLCALC, TRIG, CHOLHDL, LDLDIRECT in the last 72 hours. Thyroid Function Tests: No results for input(s): TSH, T4TOTAL, FREET4, T3FREE, THYROIDAB in the last 72 hours. Anemia Panel: Recent Labs    05/10/18 0320  VITAMINB12 2,366*  FOLATE 14.0  FERRITIN 419*  TIBC 170*  IRON 5*   Urine analysis:    Component Value Date/Time   COLORURINE STRAW (A) 05/08/2018 1400   APPEARANCEUR CLEAR 05/08/2018 1400   LABSPEC 1.005 05/08/2018 1400   PHURINE 6.0 05/08/2018 1400   GLUCOSEU NEGATIVE 05/08/2018 1400   HGBUR NEGATIVE 05/08/2018 1400   BILIRUBINUR NEGATIVE 05/08/2018 1400   KETONESUR NEGATIVE 05/08/2018 1400   PROTEINUR NEGATIVE 05/08/2018 1400   NITRITE NEGATIVE 05/08/2018 1400   LEUKOCYTESUR NEGATIVE 05/08/2018 1400   Sepsis Labs: @LABRCNTIP (procalcitonin:4,lacticidven:4)  ) Recent Results (from the past 240 hour(s))  Aerobic/Anaerobic Culture (surgical/deep wound)     Status: None (Preliminary result)   Collection Time: 05/09/18  5:53 PM  Result Value Ref Range Status   Specimen Description   Final    WOUND Performed at [facility], [place] [address] [place], [place] [address]    Special Requests   Final    Normal Performed at [facility], [place] [address], [place], [place] [address]    Gram Stain   Final    MODERATE WBC PRESENT, PREDOMINANTLY PMN ABUNDANT GRAM POSITIVE  COCCI ABUNDANT GRAM NEGATIVE RODS ABUNDANT GRAM POSITIVE RODS    Culture   Final    NO GROWTH < 12 HOURS Performed at [facility], [place] [address], [place], [place] [address]    Report Status PENDING  Incomplete      Studies: Ir Perc Cholecystostomy  Result Date: 05/09/2018 INDICATION: Acute cholecystitis, poor operative candidate History of pancreatic cancer and remote history of percutaneous biliary stent placement, now with acute cholecystitis. Please perform image guided cholecystostomy tube for infection source control purposes. Note, prolonged conversations were held the patient and the patient's family regarding the high likelihood that this cholecystostomy tube will likely be permanent given patient's multiple medical comorbidities. EXAM: ULTRASOUND AND FLUOROSCOPIC-GUIDED CHOLECYSTOSTOMY TUBE PLACEMENT COMPARISON:  CT abdomen pelvis-05/08/2018; right upper quadrant abdominal ultrasound-05/08/2018; percutaneous cholangiogram prior to removal of external biliary drainage catheter - 09/12/2015 MEDICATIONS: The patient is currently admitted to the hospital and on intravenous antibiotics. Antibiotics were administered within an appropriate time frame prior to skin puncture. ANESTHESIA/SEDATION: Moderate (conscious) sedation  was employed during this procedure. A total of Versed 1 mg and Fentanyl 50 mcg was administered intravenously. Moderate Sedation Time: 16 minutes. The patient's level of consciousness and vital signs were monitored continuously by radiology nursing throughout the procedure under my direct supervision. CONTRAST:  10 cc Isovue-300 - administered into the gallbladder fossa. FLUOROSCOPY TIME:  1 minutes 18 seconds (6.3 mGy) COMPLICATIONS: None immediate. PROCEDURE: Informed written consent was obtained from the patient and the patient's family after a discussion of the risks, benefits and alternatives to treatment. Questions regarding the procedure were encouraged and  answered. A timeout was performed prior to the initiation of the procedure. The right upper abdominal quadrant was prepped and draped in the usual sterile fashion, and a sterile drape was applied covering the operative field. Maximum barrier sterile technique with sterile gowns and gloves were used for the procedure. A timeout was performed prior to the initiation of the procedure. Local anesthesia was provided with 1% lidocaine with epinephrine. Ultrasound scanning of the right upper quadrant demonstrates a markedly dilated gallbladder. Of note, the patient reported pain with ultrasound imaging over the gallbladder. Utilizing a transhepatic approach, a 22 gauge needle was advanced into the gallbladder under direct ultrasound guidance. An ultrasound image was saved for documentation purposes. Appropriate intraluminal puncture was confirmed with the efflux of bile and advancement of an 0.018 wire into the gallbladder lumen. The needle was exchanged for an [employer] set. A small amount of contrast was injected to confirm appropriate intraluminal positioning. Over a Benson wire, a 44.2-French Cook cholecystomy tube was advanced into the gallbladder fossa, coiled and locked. Bile was aspirated and a small amount of contrast was injected as several post procedural spot radiographic images were obtained in various obliquities. The catheter was secured to the skin with suture, connected to a drainage bag and a dressing was placed. The patient tolerated the procedure well without immediate post procedural complication. IMPRESSION: Successful ultrasound and fluoroscopic guided placement of a 10.2 French cholecystostomy tube. Electronically Signed   By: Sandi Mariscal M.D.   On: 05/09/2018 17:57    Scheduled Meds: . docusate sodium  100 mg Oral Daily  . enoxaparin (LOVENOX) injection  30 mg Subcutaneous QHS  . feeding supplement (ENSURE ENLIVE)  237 mL Oral BID BM  . levothyroxine  25 mcg Oral QAC breakfast  .  polyethylene glycol  17 g Oral Daily  . sodium chloride flush  5 mL Intracatheter Q8H    Continuous Infusions: . sodium chloride Stopped (05/09/18 1709)  . sodium chloride 75 mL/hr at 05/10/18 1114  . piperacillin-tazobactam (ZOSYN)  IV 3.375 g (05/10/18 1210)     LOS: 2 days     Alma Friendly, MD [facility]   If 7PM-7AM, please contact night-coverage [email or web address] 05/10/2018, 2:57 PM

## 2018-05-10 NOTE — Progress Notes (Addendum)
[facility]/Trauma Progress Note      Assessment/Plan Principal Problem:   Acute cholecystitis Active Problems:   Hyponatremia   Essential hypertension   Pancreatic cancer metastasized to liver ([place])  Cholecystitis - US showed Distended gallbladder with gallbladder wall thickening, pericholecystic fluid and positive sonographic Murphy sign - do not recommend OR at this time for this pt in setting of pancreatic head mass  - S/P perc chole drain, 09/23  FEN: reg diet, added ensure VTE: SCD's, lovenox ID: [email or web address] 09/22>> Follow up: TBD  Plan: ambulate, continue perc chole drain and Zosyn. Ensure, bowel regiment. PT pending   LOS: 2 days    Subjective: CC: abdominal pain  Pt states pain is improved. Husband at bedside and is concerned about her confusion. She is concerned that she feels like she needs to have a bowel movement but hasn't. She takes miralax nightly. She denies flatus. She states she rarely has flatus even after a colonoscopy. She denies nausea or vomiting.   Objective: Vital signs in last 24 hours: Temp:  [98.1 F (36.7 C)-100.1 F (37.8 C)] 98.1 F (36.7 C) (09/24 5456) Pulse Rate:  [83-100] 86 (09/24 0613) Resp:  [15-35] 15 (09/24 0613) BP: (84-102)/(52-64) 95/62 (09/24 0613) SpO2:  [92 %-98 %] 98 % (09/24 0613) Last BM Date: 05/08/18  Intake/Output from previous day: 09/23 0701 - 09/24 0700 In: 1397.9 [I.V.:1158.5; IV Piggyback:239.4] Out: 700 [Urine:600; Drains:100] Intake/Output this shift: No intake/output data recorded.  PE: Gen:  Alert, NAD, pleasant, cooperative Pulm:  Rate and effort normal Abd: Soft, distended, hypoactive BS, perc chole drain with minimal green cloudy output, TTP in RUQ with guarding, no peritonitis  Skin: no rashes noted, warm and dry   Anti-infectives: Anti-infectives (From admission, onward)   Start     Dose/Rate Route Frequency Ordered Stop   05/09/18 0200  piperacillin-tazobactam (ZOSYN) IVPB 3.375  g     3.375 g 12.5 mL/hr over 240 Minutes Intravenous Every 8 hours 05/08/18 1915     05/08/18 1845  piperacillin-tazobactam (ZOSYN) IVPB 3.375 g     3.375 g 100 mL/hr over 30 Minutes Intravenous  Once 05/08/18 1833 05/08/18 1908      Lab Results:  Recent Labs    05/09/18 0420 05/10/18 0320  WBC 9.5 18.0*  HGB 9.3* 10.2*  HCT 27.7* 30.0*  PLT 332 433*   BMET Recent Labs    05/09/18 0420 05/10/18 0320  NA 133* 133*  K 3.7 3.8  CL 102 102  CO2 20* 22  GLUCOSE 63* 192*  BUN 12 15  CREATININE 0.63 0.70  CALCIUM 7.8* 8.1*   PT/INR Recent Labs    05/09/18 1540  LABPROT 19.0*  INR 1.61   CMP     Component Value Date/Time   NA 133 (L) 05/10/2018 0320   NA 137 08/16/2017 1449   K 3.8 05/10/2018 0320   K 4.0 08/16/2017 1449   CL 102 05/10/2018 0320   CO2 22 05/10/2018 0320   CO2 27 08/16/2017 1449   GLUCOSE 192 (H) 05/10/2018 0320   GLUCOSE 114 08/16/2017 1449   BUN 15 05/10/2018 0320   BUN 8.6 08/16/2017 1449   CREATININE 0.70 05/10/2018 0320   CREATININE 0.71 10/19/2017 1142   CREATININE 0.8 08/16/2017 1449   CALCIUM 8.1 (L) 05/10/2018 0320   CALCIUM 9.5 08/16/2017 1449   PROT 5.3 (L) 05/09/2018 0420   PROT 7.4 08/16/2017 1449   ALBUMIN 2.3 (L) 05/09/2018 0420   ALBUMIN 4.2 08/16/2017 1449  AST 41 05/09/2018 0420   AST 25 10/19/2017 1142   AST 37 (H) 08/16/2017 1449   ALT 20 05/09/2018 0420   ALT 20 10/19/2017 1142   ALT 40 08/16/2017 1449   ALKPHOS 180 (H) 05/09/2018 0420   ALKPHOS 91 08/16/2017 1449   BILITOT 1.1 05/09/2018 0420   BILITOT 0.4 10/19/2017 1142   BILITOT 0.41 08/16/2017 1449   GFRNONAA >60 05/10/2018 0320   GFRNONAA >60 10/19/2017 1142   GFRAA >60 05/10/2018 0320   GFRAA >60 10/19/2017 1142   Lipase     Component Value Date/Time   LIPASE 16 05/08/2018 1420    Studies/Results: Ct Abdomen Pelvis W Contrast  Result Date: 05/08/2018 CLINICAL DATA:  Pancreatic adenocarcinoma. Abdominal pain Distention. Gastrojejunostomy  created 11/27/2016. EXAM: CT ABDOMEN AND PELVIS WITH CONTRAST TECHNIQUE: Multidetector CT imaging of the abdomen and pelvis was performed using the standard protocol following bolus administration of intravenous contrast. CONTRAST:  140mL ISOVUE-300 IOPAMIDOL (ISOVUE-300) INJECTION 61% COMPARISON:  03/29/2018 and previous FINDINGS: Lower chest: Trace pleural effusions right greater than left, new since previous. Minimal dependent atelectasis posteriorly in the right lung base. Moderate hiatal hernia. Hepatobiliary: Stable metallic biliary stent. No intrahepatic biliary ductal dilatation. Thick-walled gallbladder with mucosal enhancement. Small low-attenuation liver lesions. 8 mm low-attenuation lesion in the inferior right lobe, image 28/2, not present on prior studies. Ill-defined sub 7 mm low-attenuation focus in segment 8 image 12/2, not evident on prior studies. Pancreas: Dilatation of pancreatic duct if parenchymal atrophy. Little convincing change in the ill-defined soft tissue attenuation in the region the pancreatic head, margins indistinct. Spleen: Normal in size without focal abnormality. Adrenals/Urinary Tract: Normal adrenal glands. Unremarkable kidneys. Marked distention of the urinary bladder. Stomach/Bowel: Moderate hiatal hernia. Changes of gastrojejunostomy. Stomach is nondilated. Small bowel nondilated. Appendix not identified. Colon decompressed, unremarkable. Vascular/Lymphatic: Mild scattered calcified atheromatous plaque. No aortic aneurysm. High-grade tapered narrowing near the portosplenic confluence. Reproductive: Uterus and bilateral adnexa are unremarkable. Other: New scattered mild perihepatic, perisplenic, and pelvic ascites. No free air. Musculoskeletal: Lumbar levoscoliosis with multilevel spondylitic change. Grade 1 anterolisthesis L5-S1 probably secondary to facet DJD. No fracture or worrisome bone lesion. IMPRESSION: 1. Small amount of abdominal and pelvic ascites, new since  previous. 2. Marked distention of the urinary bladder 3. New small bilateral pleural effusions 4. Persistent gallbladder dilatation with thickened enhancing gallbladder wall, suggesting cholecystitis. 5. New low-attenuation liver lesions suggesting metastatic disease. Electronically Signed   By: Lucrezia Europe M.D.   On: 05/08/2018 16:37   Ir Perc Cholecystostomy  Result Date: 05/09/2018 INDICATION: Acute cholecystitis, poor operative candidate History of pancreatic cancer and remote history of percutaneous biliary stent placement, now with acute cholecystitis. Please perform image guided cholecystostomy tube for infection source control purposes. Note, prolonged conversations were held the patient and the patient's family regarding the high likelihood that this cholecystostomy tube will likely be permanent given patient's multiple medical comorbidities. EXAM: ULTRASOUND AND FLUOROSCOPIC-GUIDED CHOLECYSTOSTOMY TUBE PLACEMENT COMPARISON:  CT abdomen pelvis-05/08/2018; right upper quadrant abdominal ultrasound-05/08/2018; percutaneous cholangiogram prior to removal of external biliary drainage catheter - 09/12/2015 MEDICATIONS: The patient is currently admitted to the hospital and on intravenous antibiotics. Antibiotics were administered within an appropriate time frame prior to skin puncture. ANESTHESIA/SEDATION: Moderate (conscious) sedation was employed during this procedure. A total of Versed 1 mg and Fentanyl 50 mcg was administered intravenously. Moderate Sedation Time: 16 minutes. The patient's level of consciousness and vital signs were monitored continuously by radiology nursing throughout the procedure under my direct supervision.  CONTRAST:  10 cc Isovue-300 - administered into the gallbladder fossa. FLUOROSCOPY TIME:  1 minutes 18 seconds (6.3 mGy) COMPLICATIONS: None immediate. PROCEDURE: Informed written consent was obtained from the patient and the patient's family after a discussion of the risks,  benefits and alternatives to treatment. Questions regarding the procedure were encouraged and answered. A timeout was performed prior to the initiation of the procedure. The right upper abdominal quadrant was prepped and draped in the usual sterile fashion, and a sterile drape was applied covering the operative field. Maximum barrier sterile technique with sterile gowns and gloves were used for the procedure. A timeout was performed prior to the initiation of the procedure. Local anesthesia was provided with 1% lidocaine with epinephrine. Ultrasound scanning of the right upper quadrant demonstrates a markedly dilated gallbladder. Of note, the patient reported pain with ultrasound imaging over the gallbladder. Utilizing a transhepatic approach, a 22 gauge needle was advanced into the gallbladder under direct ultrasound guidance. An ultrasound image was saved for documentation purposes. Appropriate intraluminal puncture was confirmed with the efflux of bile and advancement of an 0.018 wire into the gallbladder lumen. The needle was exchanged for an [place] set. A small amount of contrast was injected to confirm appropriate intraluminal positioning. Over a Benson wire, a 55.2-French Cook cholecystomy tube was advanced into the gallbladder fossa, coiled and locked. Bile was aspirated and a small amount of contrast was injected as several post procedural spot radiographic images were obtained in various obliquities. The catheter was secured to the skin with suture, connected to a drainage bag and a dressing was placed. The patient tolerated the procedure well without immediate post procedural complication. IMPRESSION: Successful ultrasound and fluoroscopic guided placement of a 10.2 French cholecystostomy tube. Electronically Signed   By: Sandi Mariscal M.D.   On: 05/09/2018 17:57   US Abdomen Limited Ruq  Result Date: 05/08/2018 CLINICAL DATA:  84 year old female with RIGHT UPPER quadrant abdominal pain and nausea  for 3 days. History of pancreatic cancer. EXAM: ULTRASOUND ABDOMEN LIMITED RIGHT UPPER QUADRANT COMPARISON:  05/08/2018 CT FINDINGS: Gallbladder: Distended gallbladder with marked wall thickening noted. Gallbladder sludge identified. A positive sonographic Percell Miller sign is noted as well as a small amount of pericholecystic fluid. Common bile duct: Diameter: 6 mm. Liver: No definite focal abnormalities. The abnormalities identified on CT are not well visualized sonographically. Portal vein is patent on color Doppler imaging with normal direction of blood flow towards the liver. Small amount of ascites noted. IMPRESSION: Distended gallbladder with gallbladder wall thickening, pericholecystic fluid and positive sonographic Murphy sign compatible with acute cholecystitis. No biliary dilatation. Small amount of ascites. Electronically Signed   By: Margarette Canada M.D.   On: 05/08/2018 17:48      Kalman Drape , [facility] 05/10/2018, 8:05 AM  Pager: [phone] Mon-Wed, Friday 7:00am-4:30pm Thurs 7am-11:30am  Consults: [phone]

## 2018-05-10 NOTE — Progress Notes (Signed)
Referring Physician(s): Dr. Barry Dienes  Supervising Physician: Sandi Mariscal  Patient Status:  [facility] - In-pt  Chief Complaint: Follow-up cholecystostomy placed 9/23 by Dr. Pascal Lux  Subjective:  84 y/o F with PMH significant for HTN, hypothyroidism, diverticular disease, pancreatic cancer and common bile duct obstruction. She is previously known to IR due to placement of biliary drain on 08/08/15 and biliary stent placement in 2017, at which time biliary drain was removed. She presented to [facility] ED on 9/22 with complaints of diffuse abdominal pain, nausea, constipation and poor appetite. CT abdomen/pelvis at that time showed persistent gallbladder dilatation with thickened enhancing gallbladder wall, suggesting cholecystitis. RUQ abdominal US showed distended gallbladder with wall thickening, sludge, positive sonographic Murphy sign and small amount pericholecystic fluid.   General surgery was consulted and she was deemed a poor surgical candidate in setting of pancreatic head mass. IR was consulted for percutaneous cholecystostomy which was performed without complication on 3/55 by Dr. Pascal Lux.  Patient reports she has abdominal pain "all over" but that this is normal for her, she thinks her pain has improved since yesterday. She denies any nausea, vomiting, chest pain or dyspnea. Patient is somewhat of a poor historian, often asking her husband what concerns she voiced to him earlier in the day and last night. Husband states she is concerned when she will start her radiation therapy.   Allergies: Sulfamethoxazole  Medications: Prior to Admission medications   Medication Sig Start Date End Date Taking? Authorizing Provider  amLODipine (NORVASC) 10 MG tablet Take 10 mg by mouth at bedtime.  06/16/15  Yes [provider]  hydrocortisone (ANUSOL-HC) 2.5 % rectal cream Place 1 application rectally 2 (two) times daily as needed for hemorrhoids or anal itching. 03/22/18  Yes Alla Feeling, NP    ibuprofen (ADVIL,MOTRIN) 100 MG chewable tablet Chew 200 mg by mouth daily as needed for mild pain.   Yes [provider]  levothyroxine (SYNTHROID, LEVOTHROID) 25 MCG tablet Take 25 mcg by mouth daily before breakfast.   Yes [provider]  lipase/protease/amylase (CREON) 12000 units CPEP capsule Take 2 capsules (24,000 Units total) by mouth 3 (three) times daily before meals. Patient taking differently: Take 24,000 Units by mouth 2 (two) times daily.  07/29/17  Yes Truitt Merle, MD  lisinopril (PRINIVIL,ZESTRIL) 5 MG tablet Take 5 mg by mouth at bedtime.    Yes [provider]  LORazepam (ATIVAN) 0.5 MG tablet Take 0.5 tablets (0.25 mg total) by mouth every 6 (six) hours as needed for anxiety (or nausea). 05/02/18  Yes Hayden Pedro, PA-C  Multiple Vitamins-Minerals (MULTIVITAMIN GUMMIES ADULT) CHEW Chew 1 tablet by mouth daily.   Yes [provider]  polyethylene glycol powder (GLYCOLAX/MIRALAX) powder Take 17 g by mouth daily as needed for mild constipation.    Yes [provider]  traMADol (ULTRAM) 50 MG tablet Take 1 tablet (50 mg total) by mouth every 6 (six) hours as needed. Patient taking differently: Take 50 mg by mouth every 6 (six) hours as needed for moderate pain.  08/16/17  Yes Truitt Merle, MD  Cholecalciferol (VITAMIN D3) 400 units CAPS Take 2 capsules by mouth daily.    [provider]     Vital Signs: BP 95/62 (BP Location: Left Arm)   Pulse 86   Temp 98.1 F (36.7 C) (Oral)   Resp 15   Ht 4' 11.49" (1.511 m)   Wt 78 lb 12.8 oz (35.7 kg)   SpO2 98%   BMI 15.66  kg/m   Physical Exam  Constitutional: No distress.  Cardiovascular: Normal rate, regular rhythm and normal heart sounds.  Pulmonary/Chest: Effort normal and breath sounds normal.  Abdominal: Soft. There is tenderness (diffuse).  RUQ drain to gravity bag with scant bilious output; insertion site clean, dry, intact. Pain with palpation over insertion  site.  Neurological: She is alert.  Skin: Skin is warm and dry. She is not diaphoretic.  Nursing note and vitals reviewed.   Imaging: Ct Abdomen Pelvis W Contrast  Result Date: 05/08/2018 CLINICAL DATA:  Pancreatic adenocarcinoma. Abdominal pain Distention. Gastrojejunostomy created 11/27/2016. EXAM: CT ABDOMEN AND PELVIS WITH CONTRAST TECHNIQUE: Multidetector CT imaging of the abdomen and pelvis was performed using the standard protocol following bolus administration of intravenous contrast. CONTRAST:  162mL ISOVUE-300 IOPAMIDOL (ISOVUE-300) INJECTION 61% COMPARISON:  03/29/2018 and previous FINDINGS: Lower chest: Trace pleural effusions right greater than left, new since previous. Minimal dependent atelectasis posteriorly in the right lung base. Moderate hiatal hernia. Hepatobiliary: Stable metallic biliary stent. No intrahepatic biliary ductal dilatation. Thick-walled gallbladder with mucosal enhancement. Small low-attenuation liver lesions. 8 mm low-attenuation lesion in the inferior right lobe, image 28/2, not present on prior studies. Ill-defined sub 7 mm low-attenuation focus in segment 8 image 12/2, not evident on prior studies. Pancreas: Dilatation of pancreatic duct if parenchymal atrophy. Little convincing change in the ill-defined soft tissue attenuation in the region the pancreatic head, margins indistinct. Spleen: Normal in size without focal abnormality. Adrenals/Urinary Tract: Normal adrenal glands. Unremarkable kidneys. Marked distention of the urinary bladder. Stomach/Bowel: Moderate hiatal hernia. Changes of gastrojejunostomy. Stomach is nondilated. Small bowel nondilated. Appendix not identified. Colon decompressed, unremarkable. Vascular/Lymphatic: Mild scattered calcified atheromatous plaque. No aortic aneurysm. High-grade tapered narrowing near the portosplenic confluence. Reproductive: Uterus and bilateral adnexa are unremarkable. Other: New scattered mild perihepatic, perisplenic,  and pelvic ascites. No free air. Musculoskeletal: Lumbar levoscoliosis with multilevel spondylitic change. Grade 1 anterolisthesis L5-S1 probably secondary to facet DJD. No fracture or worrisome bone lesion. IMPRESSION: 1. Small amount of abdominal and pelvic ascites, new since previous. 2. Marked distention of the urinary bladder 3. New small bilateral pleural effusions 4. Persistent gallbladder dilatation with thickened enhancing gallbladder wall, suggesting cholecystitis. 5. New low-attenuation liver lesions suggesting metastatic disease. Electronically Signed   By: Lucrezia Europe M.D.   On: 05/08/2018 16:37   Ir Perc Cholecystostomy  Result Date: 05/09/2018 INDICATION: Acute cholecystitis, poor operative candidate History of pancreatic cancer and remote history of percutaneous biliary stent placement, now with acute cholecystitis. Please perform image guided cholecystostomy tube for infection source control purposes. Note, prolonged conversations were held the patient and the patient's family regarding the high likelihood that this cholecystostomy tube will likely be permanent given patient's multiple medical comorbidities. EXAM: ULTRASOUND AND FLUOROSCOPIC-GUIDED CHOLECYSTOSTOMY TUBE PLACEMENT COMPARISON:  CT abdomen pelvis-05/08/2018; right upper quadrant abdominal ultrasound-05/08/2018; percutaneous cholangiogram prior to removal of external biliary drainage catheter - 09/12/2015 MEDICATIONS: The patient is currently admitted to the hospital and on intravenous antibiotics. Antibiotics were administered within an appropriate time frame prior to skin puncture. ANESTHESIA/SEDATION: Moderate (conscious) sedation was employed during this procedure. A total of Versed 1 mg and Fentanyl 50 mcg was administered intravenously. Moderate Sedation Time: 16 minutes. The patient's level of consciousness and vital signs were monitored continuously by radiology nursing throughout the procedure under my direct supervision.  CONTRAST:  10 cc Isovue-300 - administered into the gallbladder fossa. FLUOROSCOPY TIME:  1 minutes 18 seconds (6.3 mGy) COMPLICATIONS: None immediate. PROCEDURE: Informed written consent was obtained  from the patient and the patient's family after a discussion of the risks, benefits and alternatives to treatment. Questions regarding the procedure were encouraged and answered. A timeout was performed prior to the initiation of the procedure. The right upper abdominal quadrant was prepped and draped in the usual sterile fashion, and a sterile drape was applied covering the operative field. Maximum barrier sterile technique with sterile gowns and gloves were used for the procedure. A timeout was performed prior to the initiation of the procedure. Local anesthesia was provided with 1% lidocaine with epinephrine. Ultrasound scanning of the right upper quadrant demonstrates a markedly dilated gallbladder. Of note, the patient reported pain with ultrasound imaging over the gallbladder. Utilizing a transhepatic approach, a 22 gauge needle was advanced into the gallbladder under direct ultrasound guidance. An ultrasound image was saved for documentation purposes. Appropriate intraluminal puncture was confirmed with the efflux of bile and advancement of an 0.018 wire into the gallbladder lumen. The needle was exchanged for an [place] set. A small amount of contrast was injected to confirm appropriate intraluminal positioning. Over a Benson wire, a 70.2-French Cook cholecystomy tube was advanced into the gallbladder fossa, coiled and locked. Bile was aspirated and a small amount of contrast was injected as several post procedural spot radiographic images were obtained in various obliquities. The catheter was secured to the skin with suture, connected to a drainage bag and a dressing was placed. The patient tolerated the procedure well without immediate post procedural complication. IMPRESSION: Successful ultrasound and  fluoroscopic guided placement of a 10.2 French cholecystostomy tube. Electronically Signed   By: Sandi Mariscal M.D.   On: 05/09/2018 17:57   US Abdomen Limited Ruq  Result Date: 05/08/2018 CLINICAL DATA:  84 year old female with RIGHT UPPER quadrant abdominal pain and nausea for 3 days. History of pancreatic cancer. EXAM: ULTRASOUND ABDOMEN LIMITED RIGHT UPPER QUADRANT COMPARISON:  05/08/2018 CT FINDINGS: Gallbladder: Distended gallbladder with marked wall thickening noted. Gallbladder sludge identified. A positive sonographic Percell Miller sign is noted as well as a small amount of pericholecystic fluid. Common bile duct: Diameter: 6 mm. Liver: No definite focal abnormalities. The abnormalities identified on CT are not well visualized sonographically. Portal vein is patent on color Doppler imaging with normal direction of blood flow towards the liver. Small amount of ascites noted. IMPRESSION: Distended gallbladder with gallbladder wall thickening, pericholecystic fluid and positive sonographic Murphy sign compatible with acute cholecystitis. No biliary dilatation. Small amount of ascites. Electronically Signed   By: Margarette Canada M.D.   On: 05/08/2018 17:48    Labs:  CBC: Recent Labs    03/22/18 1614 05/08/18 1429 05/08/18 1452 05/09/18 0420 05/10/18 0320  WBC 5.0  --  8.1 9.5 18.0*  HGB 10.9* 9.9* 9.9* 9.3* 10.2*  HCT 33.2* 29.0* 28.9* 27.7* 30.0*  PLT 249  --  301 332 433*    COAGS: Recent Labs    05/19/17 0944 05/09/18 1540  INR 0.89 1.61  APTT 28  --     BMP: Recent Labs    05/08/18 1420 05/08/18 1429 05/08/18 2052 05/09/18 0420 05/10/18 0320  NA 125* 123* 133* 133* 133*  K 4.5 4.2 3.8 3.7 3.8  CL 91* 91* 99 102 102  CO2 23  --  22 20* 22  GLUCOSE 109* 111* 84 63* 192*  BUN 12 10 9 12 15   CALCIUM 8.5*  --  8.2* 7.8* 8.1*  CREATININE 0.50 0.40* 0.58 0.63 0.70  GFRNONAA >60  --  >60 >60 >60  GFRAA >  60  --  >60 >60 >60    LIVER FUNCTION TESTS: Recent Labs     03/15/18 1409 03/22/18 1614 05/08/18 1420 05/09/18 0420  BILITOT 0.4 0.6 1.4* 1.1  AST 38 39 29 41  ALT 37 38 21 20  ALKPHOS 187* 229* 223* 180*  PROT 7.5 7.4 5.9* 5.3*  ALBUMIN 4.3 4.2 2.8* 2.3*    Assessment and Plan:  Acute cholecystitis s/p cholecystostomy placement in IR by Dr. Pascal Lux on 9/23. Patient reports abdominal pain has improved from yesterday but is still present.  Drain with 100 cc output in last 24 hours. WBC has worsened to 18.0, H/H 10.2/30.0, patient afebrile this morning, hypotensive but stable. Cultures of aspirate are pending, she continues on IV Zosyn.  IR to continue to follow while inpatient, will schedule outpatient follow up in IR clinic upon discharge. Discharge per admitting service. Continue TID flushes with 5 cc NS and record output from drain.  Please call IR with questions or concerns.   Electronically Signed: Joaquim Nam, PA-C 05/10/2018, 10:09 AM   I spent a total of 15 Minutes at the the patient's bedside AND on the patient's hospital floor or unit, greater than 50% of which was counseling/coordinating care for cholecystostomy.

## 2018-05-10 NOTE — Progress Notes (Signed)
I spoke with the patient's son to let him know that we can start treatment back tomorrow or Thursday with radiation. They are in agreement.

## 2018-05-11 ENCOUNTER — Ambulatory Visit: Payer: Medicare Other

## 2018-05-11 ENCOUNTER — Ambulatory Visit: Admission: RE | Admit: 2018-05-11 | Payer: Medicare Other | Source: Ambulatory Visit | Admitting: Radiation Oncology

## 2018-05-11 DIAGNOSIS — E440 Moderate protein-calorie malnutrition: Secondary | ICD-10-CM

## 2018-05-11 LAB — CBC WITH DIFFERENTIAL/PLATELET
Basophils Absolute: 0 10*3/uL (ref 0.0–0.1)
Basophils Relative: 0 %
Eosinophils Absolute: 0 10*3/uL (ref 0.0–0.7)
Eosinophils Relative: 0 %
HEMATOCRIT: 29.2 % — AB (ref 36.0–46.0)
Hemoglobin: 10 g/dL — ABNORMAL LOW (ref 12.0–15.0)
LYMPHS PCT: 3 %
Lymphs Abs: 0.4 10*3/uL — ABNORMAL LOW (ref 0.7–4.0)
MCH: 28.8 pg (ref 26.0–34.0)
MCHC: 34.2 g/dL (ref 30.0–36.0)
MCV: 84.1 fL (ref 78.0–100.0)
MONO ABS: 0.5 10*3/uL (ref 0.1–1.0)
MONOS PCT: 4 %
NEUTROS ABS: 12.1 10*3/uL — AB (ref 1.7–7.7)
Neutrophils Relative %: 93 %
Platelets: 420 10*3/uL — ABNORMAL HIGH (ref 150–400)
RBC: 3.47 MIL/uL — ABNORMAL LOW (ref 3.87–5.11)
RDW: 13.9 % (ref 11.5–15.5)
WBC: 13 10*3/uL — ABNORMAL HIGH (ref 4.0–10.5)

## 2018-05-11 LAB — BASIC METABOLIC PANEL
Anion gap: 9 (ref 5–15)
BUN: 14 mg/dL (ref 8–23)
CALCIUM: 8.2 mg/dL — AB (ref 8.9–10.3)
CO2: 21 mmol/L — AB (ref 22–32)
CREATININE: 0.48 mg/dL (ref 0.44–1.00)
Chloride: 104 mmol/L (ref 98–111)
GFR calc Af Amer: >60 mL/min (ref >60)
GFR calc non Af Amer: >60 mL/min (ref >60)
GLUCOSE: 119 mg/dL — AB (ref 70–99)
Potassium: 2.7 mmol/L — CL (ref 3.5–5.1)
Sodium: 134 mmol/L — ABNORMAL LOW (ref 135–145)

## 2018-05-11 MED ORDER — POTASSIUM CHLORIDE 10 MEQ/100ML IV SOLN
10.0000 meq | INTRAVENOUS | Status: AC
  Administered 2018-05-11: 10 meq via INTRAVENOUS
  Filled 2018-05-11 (×2): qty 100

## 2018-05-11 MED ORDER — POTASSIUM CHLORIDE 10 MEQ/100ML IV SOLN
10.0000 meq | INTRAVENOUS | Status: AC
  Administered 2018-05-11 (×3): 10 meq via INTRAVENOUS
  Filled 2018-05-11 (×2): qty 100

## 2018-05-11 MED ORDER — UNJURY CHICKEN SOUP POWDER
8.0000 [oz_av] | Freq: Three times a day (TID) | ORAL | Status: DC
  Filled 2018-05-11: qty 27

## 2018-05-11 NOTE — Progress Notes (Signed)
Physical Therapy Treatment Patient Details Name: Summer Jones MRN: [id number] DOB: 02-21-1930 Today's Date: 05/11/2018    History of Present Illness 84 year old female with history of gastric outlet obstruction status post gastro-jejunostomy on 11/2017, history of CBD obstruction status post biliary stents, pancreatic cancer with possible liver mets, hypertension presented to the ED with complaints of worsening RUQ abdominal pain, with associated distention, nausea, poor p.o. intake for the past 3 days. Pt is post op percutaneous cholecystectomy surgery     PT Comments    Pt is progressing well today; incr amb distance however  pt continues to require extensive assist for bed mobility and will likely benefit from  SNF stay top incr independence prior to return home;  Follow Up Recommendations  SNF     Equipment Recommendations  Rolling walker with 5" wheels    Recommendations for Other Services       Precautions / Restrictions      Mobility  Bed Mobility Overal bed mobility: Needs Assistance Bed Mobility: Rolling;Sidelying to Sit Rolling: Mod assist;Min assist Sidelying to sit: Mod assist       General bed mobility comments: instructed/reviewed log roll technique; mod assist to lower legs and  elevate trunk  Transfers Overall transfer level: Needs assistance Equipment used: Rolling walker (2 wheeled) Transfers: Sit to/from Stand Sit to Stand: Min assist;Min guard         General transfer comment: min/guard assist for safety on transition  Ambulation/Gait Ambulation/Gait assistance: Min assist;Min guard Gait Distance (Feet): 220 Feet Assistive device: None;Rolling walker (2 wheeled);1 person hand held assist Gait Pattern/deviations: Step-through pattern;Decreased stride length;Trunk flexed     General Gait Details: min to min guard assist for balance and maneuvering RW; pt amb 100' of distance above iwth HHA of 1 and min guard, improved gait pattern and less pain  amb without RW    Stairs             Wheelchair Mobility    Modified Rankin (Stroke Patients Only)       Balance           Standing balance support: No upper extremity supported Standing balance-Leahy Scale: Fair                              Cognition Arousal/Alertness: Awake/alert Behavior During Therapy: WFL for tasks assessed/performed Overall Cognitive Status: Within Functional Limits for tasks assessed                                        Exercises      General Comments        Pertinent Vitals/Pain Pain Assessment: 0-10 Pain Score: 5  Pain Location: abd Pain Descriptors / Indicators: Burning;Aching Pain Intervention(s): Monitored during session;Premedicated before session;Repositioned    Home Living                      Prior Function            PT Goals (current goals can now be found in the care plan section) Acute Rehab PT Goals PT Goal Formulation: With patient/family Time For Goal Achievement: 05/25/18 Potential to Achieve Goals: Good Progress towards PT goals: Progressing toward goals    Frequency    Min 3X/week      PT Plan Current plan remains appropriate    Co-evaluation  AM-PAC PT "6 Clicks" Daily Activity  Outcome Measure  Difficulty turning over in bed (including adjusting bedclothes, sheets and blankets)?: Unable Difficulty moving from lying on back to sitting on the side of the bed? : Unable Difficulty sitting down on and standing up from a chair with arms (e.g., wheelchair, bedside commode, etc,.)?: Unable Help needed moving to and from a bed to chair (including a wheelchair)?: A Little Help needed walking in hospital room?: A Little Help needed climbing 3-5 steps with a railing? : A Lot 6 Click Score: 11    End of Session Equipment Utilized During Treatment: Gait belt Activity Tolerance: Patient tolerated treatment well Patient left: in chair;with call  bell/phone within reach;with family/visitor present   PT Visit Diagnosis: Unsteadiness on feet (R26.81);Other abnormalities of gait and mobility (R26.89);Muscle weakness (generalized) (M62.81)     Time: 8333-8329 PT Time Calculation (min) (ACUTE ONLY): 25 min  Charges:  $Gait Training: 8-22 mins $Therapeutic Activity: 8-22 mins                     Kenyon Ana, PT Pager: [phone] 05/11/2018   [facility] [phone]    [facility] 05/11/2018, 2:54 PM

## 2018-05-11 NOTE — Progress Notes (Signed)
Referring Physician(s): Byerly,F  Supervising Physician: Aletta Edouard  Patient Status:  [facility] - In-pt  Chief Complaint:  cholecystitis  Subjective: Pt still has some RUQ discomfort but not worsening; denies N/V   Allergies: Sulfamethoxazole  Medications: Prior to Admission medications   Medication Sig Start Date End Date Taking? Authorizing Provider  amLODipine (NORVASC) 10 MG tablet Take 10 mg by mouth at bedtime.  06/16/15  Yes [provider]  hydrocortisone (ANUSOL-HC) 2.5 % rectal cream Place 1 application rectally 2 (two) times daily as needed for hemorrhoids or anal itching. 03/22/18  Yes Alla Feeling, NP  ibuprofen (ADVIL,MOTRIN) 100 MG chewable tablet Chew 200 mg by mouth daily as needed for mild pain.   Yes [provider]  levothyroxine (SYNTHROID, LEVOTHROID) 25 MCG tablet Take 25 mcg by mouth daily before breakfast.   Yes [provider]  lipase/protease/amylase (CREON) 12000 units CPEP capsule Take 2 capsules (24,000 Units total) by mouth 3 (three) times daily before meals. Patient taking differently: Take 24,000 Units by mouth 2 (two) times daily.  07/29/17  Yes Truitt Merle, MD  lisinopril (PRINIVIL,ZESTRIL) 5 MG tablet Take 5 mg by mouth at bedtime.    Yes [provider]  LORazepam (ATIVAN) 0.5 MG tablet Take 0.5 tablets (0.25 mg total) by mouth every 6 (six) hours as needed for anxiety (or nausea). 05/02/18  Yes Hayden Pedro, PA-C  Multiple Vitamins-Minerals (MULTIVITAMIN GUMMIES ADULT) CHEW Chew 1 tablet by mouth daily.   Yes [provider]  polyethylene glycol powder (GLYCOLAX/MIRALAX) powder Take 17 g by mouth daily as needed for mild constipation.    Yes [provider]  traMADol (ULTRAM) 50 MG tablet Take 1 tablet (50 mg total) by mouth every 6 (six) hours as needed. Patient taking differently: Take 50 mg by mouth every 6 (six) hours as needed for moderate pain.  08/16/17  Yes Truitt Merle, MD    Cholecalciferol (VITAMIN D3) 400 units CAPS Take 2 capsules by mouth daily.    [provider]     Vital Signs: BP 123/74 (BP Location: Left Arm)   Pulse 88   Temp 97.7 F (36.5 C) (Oral)   Resp 16   Ht 4' 11.49" (1.511 m)   Wt 78 lb 12.8 oz (35.7 kg)   SpO2 94%   BMI 15.66 kg/m   Physical Exam GB drain intact, insertion site ok, mildly tender, drain flushes without difficulty, output 75 cc's  Imaging: Ct Abdomen Pelvis W Contrast  Result Date: 05/08/2018 CLINICAL DATA:  Pancreatic adenocarcinoma. Abdominal pain Distention. Gastrojejunostomy created 11/27/2016. EXAM: CT ABDOMEN AND PELVIS WITH CONTRAST TECHNIQUE: Multidetector CT imaging of the abdomen and pelvis was performed using the standard protocol following bolus administration of intravenous contrast. CONTRAST:  168mL ISOVUE-300 IOPAMIDOL (ISOVUE-300) INJECTION 61% COMPARISON:  03/29/2018 and previous FINDINGS: Lower chest: Trace pleural effusions right greater than left, new since previous. Minimal dependent atelectasis posteriorly in the right lung base. Moderate hiatal hernia. Hepatobiliary: Stable metallic biliary stent. No intrahepatic biliary ductal dilatation. Thick-walled gallbladder with mucosal enhancement. Small low-attenuation liver lesions. 8 mm low-attenuation lesion in the inferior right lobe, image 28/2, not present on prior studies. Ill-defined sub 7 mm low-attenuation focus in segment 8 image 12/2, not evident on prior studies. Pancreas: Dilatation of pancreatic duct if parenchymal atrophy. Little convincing change in the ill-defined soft tissue attenuation in the region the pancreatic head, margins indistinct. Spleen: Normal in size without focal abnormality. Adrenals/Urinary Tract: Normal adrenal glands. Unremarkable kidneys. Marked  distention of the urinary bladder. Stomach/Bowel: Moderate hiatal hernia. Changes of gastrojejunostomy. Stomach is nondilated. Small bowel nondilated. Appendix not identified.  Colon decompressed, unremarkable. Vascular/Lymphatic: Mild scattered calcified atheromatous plaque. No aortic aneurysm. High-grade tapered narrowing near the portosplenic confluence. Reproductive: Uterus and bilateral adnexa are unremarkable. Other: New scattered mild perihepatic, perisplenic, and pelvic ascites. No free air. Musculoskeletal: Lumbar levoscoliosis with multilevel spondylitic change. Grade 1 anterolisthesis L5-S1 probably secondary to facet DJD. No fracture or worrisome bone lesion. IMPRESSION: 1. Small amount of abdominal and pelvic ascites, new since previous. 2. Marked distention of the urinary bladder 3. New small bilateral pleural effusions 4. Persistent gallbladder dilatation with thickened enhancing gallbladder wall, suggesting cholecystitis. 5. New low-attenuation liver lesions suggesting metastatic disease. Electronically Signed   By: Lucrezia Europe M.D.   On: 05/08/2018 16:37   Ir Perc Cholecystostomy  Result Date: 05/09/2018 INDICATION: Acute cholecystitis, poor operative candidate History of pancreatic cancer and remote history of percutaneous biliary stent placement, now with acute cholecystitis. Please perform image guided cholecystostomy tube for infection source control purposes. Note, prolonged conversations were held the patient and the patient's family regarding the high likelihood that this cholecystostomy tube will likely be permanent given patient's multiple medical comorbidities. EXAM: ULTRASOUND AND FLUOROSCOPIC-GUIDED CHOLECYSTOSTOMY TUBE PLACEMENT COMPARISON:  CT abdomen pelvis-05/08/2018; right upper quadrant abdominal ultrasound-05/08/2018; percutaneous cholangiogram prior to removal of external biliary drainage catheter - 09/12/2015 MEDICATIONS: The patient is currently admitted to the hospital and on intravenous antibiotics. Antibiotics were administered within an appropriate time frame prior to skin puncture. ANESTHESIA/SEDATION: Moderate (conscious) sedation was  employed during this procedure. A total of Versed 1 mg and Fentanyl 50 mcg was administered intravenously. Moderate Sedation Time: 16 minutes. The patient's level of consciousness and vital signs were monitored continuously by radiology nursing throughout the procedure under my direct supervision. CONTRAST:  10 cc Isovue-300 - administered into the gallbladder fossa. FLUOROSCOPY TIME:  1 minutes 18 seconds (6.3 mGy) COMPLICATIONS: None immediate. PROCEDURE: Informed written consent was obtained from the patient and the patient's family after a discussion of the risks, benefits and alternatives to treatment. Questions regarding the procedure were encouraged and answered. A timeout was performed prior to the initiation of the procedure. The right upper abdominal quadrant was prepped and draped in the usual sterile fashion, and a sterile drape was applied covering the operative field. Maximum barrier sterile technique with sterile gowns and gloves were used for the procedure. A timeout was performed prior to the initiation of the procedure. Local anesthesia was provided with 1% lidocaine with epinephrine. Ultrasound scanning of the right upper quadrant demonstrates a markedly dilated gallbladder. Of note, the patient reported pain with ultrasound imaging over the gallbladder. Utilizing a transhepatic approach, a 22 gauge needle was advanced into the gallbladder under direct ultrasound guidance. An ultrasound image was saved for documentation purposes. Appropriate intraluminal puncture was confirmed with the efflux of bile and advancement of an 0.018 wire into the gallbladder lumen. The needle was exchanged for an [place] set. A small amount of contrast was injected to confirm appropriate intraluminal positioning. Over a Benson wire, a 32.2-French Cook cholecystomy tube was advanced into the gallbladder fossa, coiled and locked. Bile was aspirated and a small amount of contrast was injected as several post procedural  spot radiographic images were obtained in various obliquities. The catheter was secured to the skin with suture, connected to a drainage bag and a dressing was placed. The patient tolerated the procedure well without immediate post procedural complication. IMPRESSION: Successful  ultrasound and fluoroscopic guided placement of a 10.2 French cholecystostomy tube. Electronically Signed   By: Sandi Mariscal M.D.   On: 05/09/2018 17:57   US Abdomen Limited Ruq  Result Date: 05/08/2018 CLINICAL DATA:  84 year old female with RIGHT UPPER quadrant abdominal pain and nausea for 3 days. History of pancreatic cancer. EXAM: ULTRASOUND ABDOMEN LIMITED RIGHT UPPER QUADRANT COMPARISON:  05/08/2018 CT FINDINGS: Gallbladder: Distended gallbladder with marked wall thickening noted. Gallbladder sludge identified. A positive sonographic Percell Miller sign is noted as well as a small amount of pericholecystic fluid. Common bile duct: Diameter: 6 mm. Liver: No definite focal abnormalities. The abnormalities identified on CT are not well visualized sonographically. Portal vein is patent on color Doppler imaging with normal direction of blood flow towards the liver. Small amount of ascites noted. IMPRESSION: Distended gallbladder with gallbladder wall thickening, pericholecystic fluid and positive sonographic Murphy sign compatible with acute cholecystitis. No biliary dilatation. Small amount of ascites. Electronically Signed   By: Margarette Canada M.D.   On: 05/08/2018 17:48    Labs:  CBC: Recent Labs    05/08/18 1452 05/09/18 0420 05/10/18 0320 05/11/18 0354  WBC 8.1 9.5 18.0* 13.0*  HGB 9.9* 9.3* 10.2* 10.0*  HCT 28.9* 27.7* 30.0* 29.2*  PLT 301 332 433* 420*    COAGS: Recent Labs    05/19/17 0944 05/09/18 1540  INR 0.89 1.61  APTT 28  --     BMP: Recent Labs    05/08/18 2052 05/09/18 0420 05/10/18 0320 05/11/18 0354  NA 133* 133* 133* 134*  K 3.8 3.7 3.8 2.7*  CL 99 102 102 104  CO2 22 20* 22 21*  GLUCOSE 84  63* 192* 119*  BUN 9 12 15 14   CALCIUM 8.2* 7.8* 8.1* 8.2*  CREATININE 0.58 0.63 0.70 0.48  GFRNONAA >60 >60 >60 >60  GFRAA >60 >60 >60 >60    LIVER FUNCTION TESTS: Recent Labs    03/15/18 1409 03/22/18 1614 05/08/18 1420 05/09/18 0420  BILITOT 0.4 0.6 1.4* 1.1  AST 38 39 29 41  ALT 37 38 21 20  ALKPHOS 187* 229* 223* 180*  PROT 7.5 7.4 5.9* 5.3*  ALBUMIN 4.3 4.2 2.8* 2.3*    Assessment and Plan: Pt with hx acute cholecystitis/panc ca; s/p perc cholecystostomy 9/23; afebrile; WBC 13(18), hgb stable, creat nl; K 2.7- replace; drain fluid cx pend; cont drain irrigation- every 8 hrs as IP , once daily as OP; f/u cholangiogram/drain exchange in 4-6 weeks; other plans per CCS   Electronically Signed: D. Rowe Robert, PA-C 05/11/2018, 9:24 AM   I spent a total of 15 minutes at the the patient's bedside AND on the patient's hospital floor or unit, greater than 50% of which was counseling/coordinating care for gallbladder drain    Patient ID: Summer Jones, female   DOB: [birth year], 84 y.o.   MRN: [id number]

## 2018-05-11 NOTE — Progress Notes (Signed)
PROGRESS NOTE  Summer Jones HGD:[id number] DOB: 1930/03/22 DOA: 05/08/2018 PCP: Crist Infante, MD  HPI/Recap of past 24 hours: 88 WF history of gastric outlet obstruction-- gastro-jejunostomy on 11/2017,  history of CBD obstruction status post biliary stents,  pancreatic cancer with possible liver mets,  hypertension  Admit 9/22 RUQ abdominal pain, with associated distention, nausea, poor p.o. intake for the past 3 days.    In the ED, CT abdomen and ultrasound showed possible acute cholecystitis, small amount of ascites, with known pancreatic cancer with mets to the liver.  Patient admitted for further management.  General surgery on consult.   SUBJ Awake alert--has 6-8/10 pain No fever Felt n yesterday About to go to XRT for first time but 'scared'--Feels unsure will be able to lie flat given some back pain   Assessment/Plan: Principal Problem:   Acute cholecystitis Active Problems:   Hyponatremia   Essential hypertension   Pancreatic cancer metastasized to liver [facility])   Palliative care by specialist   Pain of metastatic malignancy   Goals of care, counseling/discussion   Advance care planning   Malnutrition of moderate degree  Acute cholecystitis s/p percutaneous cholecystostomy on 05/09/18 by IR Afebrile, wbc 18-->13 Surgical deep wound culture pending CT abdomen/pelvis showed persistent gallbladder dilatation with thickened enhancing gallbladder wall, suggesting cholecystitis General surgery consulted, noted to be a poor surgical candidate--s/p perc chole drain 9/23 Continue IV Zosyn Pain management--no changes today CLD, advance as tolerated  Mild hyponatremia Likely 2/2 dehydration and stable in 134 range Continue IVF Daily BMP  Normocytic anemia 2/2 likely malignancy Hgb stable Anemia panel showed iron 5, sats 3, ferritin 419, folate 14, Vit B12 2,366 S/P 1 dose of feraheme on 05/10/18  Hypothyroidism Continue synthroid  Metastatic pancreatic CA to the  liver Planned to start palliative rad on 05/09/18 Hold off for now     Code Status: DNR  Family Communication: Husband at bedside  Disposition Plan: Once significant improvement   Consultants:  Gen Surg  IR  Procedures: Percutaneous cholecystostomy on 05/09/18 by IR  Antimicrobials:  Zosyn  DVT prophylaxis: Lovenox   Objective: Vitals:   05/10/18 0613 05/10/18 1322 05/10/18 2043 05/11/18 0438  BP: 95/62 94/62 112/68 123/74  Pulse: 86 90 87 88  Resp: 15 18 16 16   Temp: 98.1 F (36.7 C) 98.2 F (36.8 C) 98.4 F (36.9 C) 97.7 F (36.5 C)  TempSrc: Oral Oral Oral Oral  SpO2: 98% 100% 94% 94%  Weight:      Height:        Intake/Output Summary (Last 24 hours) at 05/11/2018 1304 Last data filed at 05/11/2018 0636 Gross per 24 hour  Intake 634.05 ml  Output 925 ml  Net -290.95 ml   Filed Weights   05/08/18 1335  Weight: 35.7 kg    Exam:  Awake alert pleasant in nad eomi frial and cachectic Bi-temporal wasting abd scaphoid-drain in RUQ No le edema Neuro intact coherent mentall--power 5/5  Data Reviewed: CBC: Recent Labs  Lab 05/08/18 1429 05/08/18 1452 05/09/18 0420 05/10/18 0320 05/11/18 0354  WBC  --  8.1 9.5 18.0* 13.0*  NEUTROABS  --  7.4  --  17.0* 12.1*  HGB 9.9* 9.9* 9.3* 10.2* 10.0*  HCT 29.0* 28.9* 27.7* 30.0* 29.2*  MCV  --  86.8 88.2 86.5 84.1  PLT  --  301 332 433* 962*   Basic Metabolic Panel: Recent Labs  Lab 05/08/18 1420 05/08/18 1429 05/08/18 2052 05/09/18 0420 05/10/18 0320 05/11/18 0354  NA  125* 123* 133* 133* 133* 134*  K 4.5 4.2 3.8 3.7 3.8 2.7*  CL 91* 91* 99 102 102 104  CO2 23  --  22 20* 22 21*  GLUCOSE 109* 111* 84 63* 192* 119*  BUN 12 10 9 12 15 14   CREATININE 0.50 0.40* 0.58 0.63 0.70 0.48  CALCIUM 8.5*  --  8.2* 7.8* 8.1* 8.2*   GFR: Estimated Creatinine Clearance: 28.4 mL/min (by C-G formula based on SCr of 0.48 mg/dL). Liver Function Tests: Recent Labs  Lab 05/08/18 1420 05/09/18 0420  AST  29 41  ALT 21 20  ALKPHOS 223* 180*  BILITOT 1.4* 1.1  PROT 5.9* 5.3*  ALBUMIN 2.8* 2.3*   Recent Labs  Lab 05/08/18 1420  LIPASE 16   No results for input(s): AMMONIA in the last 168 hours. Coagulation Profile: Recent Labs  Lab 05/09/18 1540  INR 1.61   Cardiac Enzymes: No results for input(s): CKTOTAL, CKMB, CKMBINDEX, TROPONINI in the last 168 hours. BNP (last 3 results) No results for input(s): PROBNP in the last 8760 hours. HbA1C: No results for input(s): HGBA1C in the last 72 hours. CBG: No results for input(s): GLUCAP in the last 168 hours. Lipid Profile: No results for input(s): CHOL, HDL, LDLCALC, TRIG, CHOLHDL, LDLDIRECT in the last 72 hours. Thyroid Function Tests: No results for input(s): TSH, T4TOTAL, FREET4, T3FREE, THYROIDAB in the last 72 hours. Anemia Panel: Recent Labs    05/10/18 0320  VITAMINB12 2,366*  FOLATE 14.0  FERRITIN 419*  TIBC 170*  IRON 5*   Urine analysis:    Component Value Date/Time   COLORURINE STRAW (A) 05/08/2018 1400   APPEARANCEUR CLEAR 05/08/2018 1400   LABSPEC 1.005 05/08/2018 1400   PHURINE 6.0 05/08/2018 1400   GLUCOSEU NEGATIVE 05/08/2018 1400   HGBUR NEGATIVE 05/08/2018 1400   BILIRUBINUR NEGATIVE 05/08/2018 1400   KETONESUR NEGATIVE 05/08/2018 1400   PROTEINUR NEGATIVE 05/08/2018 1400   NITRITE NEGATIVE 05/08/2018 1400   LEUKOCYTESUR NEGATIVE 05/08/2018 1400   Sepsis Labs: @LABRCNTIP (procalcitonin:4,lacticidven:4)  ) Recent Results (from the past 240 hour(s))  Aerobic/Anaerobic Culture (surgical/deep wound)     Status: None (Preliminary result)   Collection Time: 05/09/18  5:53 PM  Result Value Ref Range Status   Specimen Description   Final    WOUND Performed at [facility], [place] [address] [place], [place] [address]    Special Requests   Final    Normal Performed at [facility], [place] [address], [place], [place] [address]    Gram Stain   Final    MODERATE WBC  PRESENT, PREDOMINANTLY PMN ABUNDANT GRAM POSITIVE COCCI ABUNDANT GRAM NEGATIVE RODS ABUNDANT GRAM POSITIVE RODS Performed at [facility], [place] [address] [place], [place] [address]    Culture   Final    FEW KLEBSIELLA OXYTOCA ABUNDANT VIRIDANS STREPTOCOCCUS    Report Status PENDING  Incomplete      Studies: No results found.  Scheduled Meds: . acetaminophen  650 mg Oral Q6H  . docusate sodium  100 mg Oral Daily  . enoxaparin (LOVENOX) injection  30 mg Subcutaneous QHS  . feeding supplement (ENSURE ENLIVE)  237 mL Oral BID BM  . levothyroxine  25 mcg Oral QAC breakfast  . polyethylene glycol  17 g Oral Daily  . protein supplement  8 oz Oral TID WC  . sodium chloride flush  5 mL Intracatheter Q8H    Continuous Infusions: . sodium chloride Stopped (05/09/18 1709)  . sodium chloride  75 mL/hr at 05/11/18 0059  . piperacillin-tazobactam (ZOSYN)  IV 3.375 g (05/11/18 1007)     LOS: 3 days     Nita Sells, MD [facility]   If 7PM-7AM, please contact night-coverage [email or web address] 05/11/2018, 1:04 PM

## 2018-05-11 NOTE — Progress Notes (Signed)
Met with pt and husband at bedside to discuss disposition plan. Pt with pancreatic cancer with mets to liver per report, receiving palliative radiation at cancer center. Admitted for acute cholecystitis. Discussed with pt anticipated post-acute care needs and goals. She and husband state they are aware SNF recommended from therapy standpoint. Pt states she is not sure at this point if rehab will be within her goals. States she would like to return home and spend time with her family. CSW discussed support available to her at home should she be conditioned upon return. She and husband ask to be given time to consider plans and options. CSW will return to continue planning.  Sharren Bridge, MSW, LCSW Clinical Social Work 05/11/2018 [phone]

## 2018-05-11 NOTE — Progress Notes (Signed)
Pharmacy Antibiotic Note  Summer Jones is a 84 y.o. female admitted on 05/08/2018 with with past medical history of pancreatic cancer, bowel obstruction, here with diffuse abdominal pain.Marland Kitchen  Pharmacy has been consulted for zosyn dosing.  Plan: Continue Zosyn 3.375g IV Q8H infused over 4hrs. F/u for change to PO abx, recommend 10-14 days total Follow renal function and cultures   Height: 4' 11.49" (151.1 cm) Weight: 78 lb 12.8 oz (35.7 kg) IBW/kg (Calculated) : 44.32  Temp (24hrs), Avg:98.1 F (36.7 C), Min:97.7 F (36.5 C), Max:98.4 F (36.9 C)  Recent Labs  Lab 05/08/18 1427 05/08/18 1429 05/08/18 1452 05/08/18 2052 05/09/18 0420 05/10/18 0320 05/11/18 0354  WBC  --   --  8.1  --  9.5 18.0* 13.0*  CREATININE  --  0.40*  --  0.58 0.63 0.70 0.48  LATICACIDVEN 1.18  --   --   --   --   --   --     Estimated Creatinine Clearance: 28.4 mL/min (by C-G formula based on SCr of 0.48 mg/dL).    Allergies  Allergen Reactions  . Sulfamethoxazole Nausea Only    Antimicrobials this admission: 9/22 zosyn >>   Thank you for allowing pharmacy to be a part of this patient's care.   Royetta Asal, PharmD, BCPS Pager [phone] 05/11/2018 9:30 AM

## 2018-05-11 NOTE — Progress Notes (Signed)
[facility]/Trauma Progress Note      Assessment/Plan Principal Problem: Acute cholecystitis Active Problems: Hyponatremia Essential hypertension Pancreatic cancer metastasized to liver ([place])  Cholecystitis - USshowed Distended gallbladder with gallbladder wall thickening, pericholecystic fluid and positive sonographic Murphy sign - do not recommend OR at this time for this pt in setting of pancreatic head mass  - S/P perc chole drain, 09/23  FEN:reg diet, added ensure VTE: SCD's, lovenox YN:WGNFA 09/22>>  WBC trending down. Okay for PO abx at discharge, recommend 10-14 days total Follow up:TBD  Plan:ambulate, continue perc chole drain and Zosyn. Ensure, bowel regiment. PT rec SNF.    LOS: 3 days    Subjective: CC: abdominal pain  Pt needed a pain pill to sleep last night. She had a BM. No nausea or vomiting. She is not eating much. I discussed the importance of ambulation and eating. Husband at bedside.   Objective: Vital signs in last 24 hours: Temp:  [97.7 F (36.5 C)-98.4 F (36.9 C)] 97.7 F (36.5 C) (09/25 0438) Pulse Rate:  [87-90] 88 (09/25 0438) Resp:  [16-18] 16 (09/25 0438) BP: (94-123)/(62-74) 123/74 (09/25 0438) SpO2:  [94 %-100 %] 94 % (09/25 0438) Last BM Date: 05/08/18  Intake/Output from previous day: 09/24 0701 - 09/25 0700 In: 754.1 [P.O.:300; I.V.:404.1; IV Piggyback:50] Out: 925 [Urine:850; Drains:75] Intake/Output this shift: No intake/output data recorded.  PE: Gen:  Alert, NAD, pleasant, cooperative Pulm:  Rate and effort normal Abd: Soft, mild distention, +BS, perc chole drain with scant brown cloudy output, TTP in RUQ with guarding, no peritonitis  Skin: no rashes noted, warm and dry   Anti-infectives: Anti-infectives (From admission, onward)   Start     Dose/Rate Route Frequency Ordered Stop   05/09/18 0200  piperacillin-tazobactam (ZOSYN) IVPB 3.375 g     3.375 g 12.5 mL/hr over 240 Minutes  Intravenous Every 8 hours 05/08/18 1915     05/08/18 1845  piperacillin-tazobactam (ZOSYN) IVPB 3.375 g     3.375 g 100 mL/hr over 30 Minutes Intravenous  Once 05/08/18 1833 05/08/18 1908      Lab Results:  Recent Labs    05/10/18 0320 05/11/18 0354  WBC 18.0* 13.0*  HGB 10.2* 10.0*  HCT 30.0* 29.2*  PLT 433* 420*   BMET Recent Labs    05/10/18 0320 05/11/18 0354  NA 133* 134*  K 3.8 2.7*  CL 102 104  CO2 22 21*  GLUCOSE 192* 119*  BUN 15 14  CREATININE 0.70 0.48  CALCIUM 8.1* 8.2*   PT/INR Recent Labs    05/09/18 1540  LABPROT 19.0*  INR 1.61   CMP     Component Value Date/Time   NA 134 (L) 05/11/2018 0354   NA 137 08/16/2017 1449   K 2.7 (LL) 05/11/2018 0354   K 4.0 08/16/2017 1449   CL 104 05/11/2018 0354   CO2 21 (L) 05/11/2018 0354   CO2 27 08/16/2017 1449   GLUCOSE 119 (H) 05/11/2018 0354   GLUCOSE 114 08/16/2017 1449   BUN 14 05/11/2018 0354   BUN 8.6 08/16/2017 1449   CREATININE 0.48 05/11/2018 0354   CREATININE 0.71 10/19/2017 1142   CREATININE 0.8 08/16/2017 1449   CALCIUM 8.2 (L) 05/11/2018 0354   CALCIUM 9.5 08/16/2017 1449   PROT 5.3 (L) 05/09/2018 0420   PROT 7.4 08/16/2017 1449   ALBUMIN 2.3 (L) 05/09/2018 0420   ALBUMIN 4.2 08/16/2017 1449   AST 41 05/09/2018 0420   AST 25 10/19/2017 1142   AST  37 (H) 08/16/2017 1449   ALT 20 05/09/2018 0420   ALT 20 10/19/2017 1142   ALT 40 08/16/2017 1449   ALKPHOS 180 (H) 05/09/2018 0420   ALKPHOS 91 08/16/2017 1449   BILITOT 1.1 05/09/2018 0420   BILITOT 0.4 10/19/2017 1142   BILITOT 0.41 08/16/2017 1449   GFRNONAA >60 05/11/2018 0354   GFRNONAA >60 10/19/2017 1142   GFRAA >60 05/11/2018 0354   GFRAA >60 10/19/2017 1142   Lipase     Component Value Date/Time   LIPASE 16 05/08/2018 1420    Studies/Results: Ir Perc Cholecystostomy  Result Date: 05/09/2018 INDICATION: Acute cholecystitis, poor operative candidate History of pancreatic cancer and remote history of percutaneous  biliary stent placement, now with acute cholecystitis. Please perform image guided cholecystostomy tube for infection source control purposes. Note, prolonged conversations were held the patient and the patient's family regarding the high likelihood that this cholecystostomy tube will likely be permanent given patient's multiple medical comorbidities. EXAM: ULTRASOUND AND FLUOROSCOPIC-GUIDED CHOLECYSTOSTOMY TUBE PLACEMENT COMPARISON:  CT abdomen pelvis-05/08/2018; right upper quadrant abdominal ultrasound-05/08/2018; percutaneous cholangiogram prior to removal of external biliary drainage catheter - 09/12/2015 MEDICATIONS: The patient is currently admitted to the hospital and on intravenous antibiotics. Antibiotics were administered within an appropriate time frame prior to skin puncture. ANESTHESIA/SEDATION: Moderate (conscious) sedation was employed during this procedure. A total of Versed 1 mg and Fentanyl 50 mcg was administered intravenously. Moderate Sedation Time: 16 minutes. The patient's level of consciousness and vital signs were monitored continuously by radiology nursing throughout the procedure under my direct supervision. CONTRAST:  10 cc Isovue-300 - administered into the gallbladder fossa. FLUOROSCOPY TIME:  1 minutes 18 seconds (6.3 mGy) COMPLICATIONS: None immediate. PROCEDURE: Informed written consent was obtained from the patient and the patient's family after a discussion of the risks, benefits and alternatives to treatment. Questions regarding the procedure were encouraged and answered. A timeout was performed prior to the initiation of the procedure. The right upper abdominal quadrant was prepped and draped in the usual sterile fashion, and a sterile drape was applied covering the operative field. Maximum barrier sterile technique with sterile gowns and gloves were used for the procedure. A timeout was performed prior to the initiation of the procedure. Local anesthesia was provided with 1%  lidocaine with epinephrine. Ultrasound scanning of the right upper quadrant demonstrates a markedly dilated gallbladder. Of note, the patient reported pain with ultrasound imaging over the gallbladder. Utilizing a transhepatic approach, a 22 gauge needle was advanced into the gallbladder under direct ultrasound guidance. An ultrasound image was saved for documentation purposes. Appropriate intraluminal puncture was confirmed with the efflux of bile and advancement of an 0.018 wire into the gallbladder lumen. The needle was exchanged for an Callery set. A small amount of contrast was injected to confirm appropriate intraluminal positioning. Over a Benson wire, a 73.2-French Cook cholecystomy tube was advanced into the gallbladder fossa, coiled and locked. Bile was aspirated and a small amount of contrast was injected as several post procedural spot radiographic images were obtained in various obliquities. The catheter was secured to the skin with suture, connected to a drainage bag and a dressing was placed. The patient tolerated the procedure well without immediate post procedural complication. IMPRESSION: Successful ultrasound and fluoroscopic guided placement of a 10.2 French cholecystostomy tube. Electronically Signed   By: Sandi Mariscal M.D.   On: 05/09/2018 17:57      Kalman Drape , [facility] 05/11/2018, 8:29 AM  Pager: [phone] Mon-Wed,  Friday 7:00am-4:30pm Thurs 7am-11:30am  Consults: [phone]

## 2018-05-11 NOTE — Progress Notes (Signed)
Initial Nutrition Assessment  DOCUMENTATION CODES:   Non-severe (moderate) malnutrition in context of chronic illness, Underweight  INTERVENTION:   -Continue Ensure Enlive po BID, each supplement provides 350 kcal and 20 grams of protein -Provide Unjury chicken Soup w/ meals, Each serving provides 100kcal and 21g protein -While on clears  NUTRITION DIAGNOSIS:   Moderate Malnutrition related to chronic illness, cancer and cancer related treatments as evidenced by energy intake < or equal to 75% for > or equal to 1 month, severe fat depletion, moderate muscle depletion.  GOAL:   Patient will meet greater than or equal to 90% of their needs  MONITOR:   PO intake, Diet advancement, Supplement acceptance, Labs, Weight trends, I & O's  REASON FOR ASSESSMENT:   Consult Assessment of nutrition requirement/status  ASSESSMENT:   84 year old female with history of gastric outlet obstruction status post gastro-jejunostomy on 11/2017, history of CBD obstruction status post biliary stents, pancreatic cancer with possible liver mets, hypertension presented to the ED with complaints of worsening RUQ abdominal pain, with associated distention, nausea, poor p.o. intake for the past 3 days.   Patient in room with husband at bedside. Pt confused by nutritional assessment and repeatedly asked RD "what are you selling?". This was asked after RD was discussing nutrition supplement options. Pt states she has a whole fridge full of Ensures at home and she was curious what the supplements would cost her here. Pt states she is a little confused since she has been on pain medications. Pt's husband seemed just as confused about supplements but eventually understood that once the patient's diet is advanced she was able to have Ensure but while on clears she was receiving Boost Breeze, which she did not like.  Pt eager to have a diet more than liquids. Per patient, RN was to check on diet being advanced.  States  she hasn't been eating well at home, she typically fixes 2 meals a day but won't finish them. She does not eat a lot of meat. Tries to drink 1-2 Ensures daily.   Per weight records, pt has lost 8 lb since 08/16/17 (9% wt loss x 9 months, insignificant for time frame).   Medications: Colace capsule daily, Miralax packet daily Labs reviewed: Low Na, K   NUTRITION - FOCUSED PHYSICAL EXAM:    Most Recent Value  Orbital Region  Mild depletion  Upper Arm Region  Severe depletion  Thoracic and Lumbar Region  Unable to assess  Buccal Region  Mild depletion  Temple Region  Mild depletion  Clavicle Bone Region  Moderate depletion  Clavicle and Acromion Bone Region  Moderate depletion  Scapular Bone Region  Unable to assess  Dorsal Hand  Moderate depletion  Patellar Region  Unable to assess  Anterior Thigh Region  Unable to assess  Posterior Calf Region  Unable to assess  Edema (RD Assessment)  None       Diet Order:   Diet Order            Diet clear liquid Room service appropriate? Yes; Fluid consistency: Thin  Diet effective now              EDUCATION NEEDS:   Education needs have been addressed  Skin:  Skin Assessment: Reviewed RN Assessment  Last BM:  9/22  Height:   Ht Readings from Last 1 Encounters:  05/08/18 4' 11.49" (1.511 m)    Weight:   Wt Readings from Last 1 Encounters:  05/08/18 35.7 kg  Ideal Body Weight:  44.7 kg  BMI:  Body mass index is 15.66 kg/m.  Estimated Nutritional Needs:   Kcal:  1350-1550  Protein:  60-70g  Fluid:  1.5L/day  Clayton Bibles, MS, RD, LDN [place] [facility] Pager: [phone] After Hours Pager: [phone]

## 2018-05-12 ENCOUNTER — Ambulatory Visit: Payer: Medicare Other | Admitting: Radiation Oncology

## 2018-05-12 ENCOUNTER — Ambulatory Visit: Payer: Medicare Other | Admitting: Hematology

## 2018-05-12 ENCOUNTER — Ambulatory Visit: Payer: Medicare Other

## 2018-05-12 ENCOUNTER — Other Ambulatory Visit: Payer: Medicare Other

## 2018-05-12 DIAGNOSIS — C259 Malignant neoplasm of pancreas, unspecified: Secondary | ICD-10-CM

## 2018-05-12 DIAGNOSIS — K819 Cholecystitis, unspecified: Secondary | ICD-10-CM

## 2018-05-12 DIAGNOSIS — C787 Secondary malignant neoplasm of liver and intrahepatic bile duct: Secondary | ICD-10-CM

## 2018-05-12 LAB — CBC WITH DIFFERENTIAL/PLATELET
Basophils Absolute: 0 10*3/uL (ref 0.0–0.1)
Basophils Relative: 0 %
EOS ABS: 0.1 10*3/uL (ref 0.0–0.7)
EOS PCT: 1 %
HCT: 28.3 % — ABNORMAL LOW (ref 36.0–46.0)
HEMOGLOBIN: 9.9 g/dL — AB (ref 12.0–15.0)
LYMPHS ABS: 0.5 10*3/uL — AB (ref 0.7–4.0)
Lymphocytes Relative: 4 %
MCH: 29.1 pg (ref 26.0–34.0)
MCHC: 35 g/dL (ref 30.0–36.0)
MCV: 83.2 fL (ref 78.0–100.0)
Monocytes Absolute: 0.6 10*3/uL (ref 0.1–1.0)
Monocytes Relative: 5 %
Neutro Abs: 10.8 10*3/uL — ABNORMAL HIGH (ref 1.7–7.7)
Neutrophils Relative %: 90 %
Platelets: 386 10*3/uL (ref 150–400)
RBC: 3.4 MIL/uL — ABNORMAL LOW (ref 3.87–5.11)
RDW: 13.8 % (ref 11.5–15.5)
WBC: 12 10*3/uL — ABNORMAL HIGH (ref 4.0–10.5)

## 2018-05-12 LAB — BASIC METABOLIC PANEL
Anion gap: 8 (ref 5–15)
BUN: 9 mg/dL (ref 8–23)
CHLORIDE: 103 mmol/L (ref 98–111)
CO2: 21 mmol/L — ABNORMAL LOW (ref 22–32)
Calcium: 7.9 mg/dL — ABNORMAL LOW (ref 8.9–10.3)
Creatinine, Ser: 0.48 mg/dL (ref 0.44–1.00)
GFR calc Af Amer: >60 mL/min (ref >60)
GFR calc non Af Amer: >60 mL/min (ref >60)
Glucose, Bld: 123 mg/dL — ABNORMAL HIGH (ref 70–99)
Potassium: 3.3 mmol/L — ABNORMAL LOW (ref 3.5–5.1)
SODIUM: 132 mmol/L — AB (ref 135–145)

## 2018-05-12 LAB — MRSA PCR SCREENING: MRSA BY PCR: NEGATIVE

## 2018-05-12 MED ORDER — METOPROLOL TARTRATE 25 MG PO TABS: 12.5000 mg | ORAL_TABLET | Freq: Two times a day (BID) | ORAL | Status: DC

## 2018-05-12 MED ORDER — DIGOXIN 0.25 MG/ML IJ SOLN
0.2500 mg | Freq: Once | INTRAMUSCULAR | Status: AC
  Administered 2018-05-12: 0.25 mg via INTRAVENOUS
  Filled 2018-05-12: qty 1

## 2018-05-12 MED ORDER — DILTIAZEM HCL-DEXTROSE 100-5 MG/100ML-% IV SOLN (PREMIX)
5.0000 mg/h | INTRAVENOUS | Status: DC
  Administered 2018-05-12: 5 mg/h via INTRAVENOUS
  Filled 2018-05-12: qty 100

## 2018-05-12 MED ORDER — DILTIAZEM HCL-DEXTROSE 100-5 MG/100ML-% IV SOLN (PREMIX)
5.0000 mg/h | INTRAVENOUS | Status: DC
  Administered 2018-05-12 (×2): 10 mg/h via INTRAVENOUS
  Filled 2018-05-12 (×2): qty 100

## 2018-05-12 MED ORDER — DILTIAZEM HCL 30 MG PO TABS: 30.0000 mg | ORAL_TABLET | Freq: Three times a day (TID) | ORAL | Status: DC

## 2018-05-12 MED ORDER — ACETAMINOPHEN 160 MG/5ML PO SOLN
650.0000 mg | Freq: Four times a day (QID) | ORAL | Status: DC
  Administered 2018-05-12 – 2018-05-18 (×9): 650 mg via ORAL
  Filled 2018-05-12 (×15): qty 20.3

## 2018-05-12 MED ORDER — METOPROLOL TARTRATE 5 MG/5ML IV SOLN
5.0000 mg | Freq: Once | INTRAVENOUS | Status: AC
  Administered 2018-05-12: 5 mg via INTRAVENOUS
  Filled 2018-05-12: qty 5

## 2018-05-12 MED ORDER — AMOXICILLIN-POT CLAVULANATE 875-125 MG PO TABS
1.0000 | ORAL_TABLET | Freq: Two times a day (BID) | ORAL | Status: DC
  Administered 2018-05-12 – 2018-05-13 (×3): 1 via ORAL
  Filled 2018-05-12 (×3): qty 1

## 2018-05-12 MED ORDER — LIP MEDEX EX OINT
TOPICAL_OINTMENT | CUTANEOUS | Status: AC
  Administered 2018-05-12: 1
  Filled 2018-05-12: qty 7

## 2018-05-12 NOTE — Progress Notes (Addendum)
EKG confirms Rapid AFib--unclear how long duration-starting Cardizem GTT and tx to SDU No prior h/o Get TSH Need to update Echo--unclear EF Will d/w Dr. Doylene Canard to see and eval   CRITICAL CARE Performed by: Nita Sells   Total critical care time: 35 minutes  Critical care time was exclusive of separately billable procedures and treating other patients.  Critical care was necessary to treat or prevent imminent or life-threatening deterioration.  Critical care was time spent personally by me on the following activities: development of treatment plan with patient and/or surrogate as well as nursing, discussions with consultants, evaluation of patient's response to treatment, examination of patient, obtaining history from patient or surrogate, ordering and performing treatments and interventions, ordering and review of laboratory studies, ordering and review of radiographic studies, pulse oximetry and re-evaluation of patient's condition.   Verneita Griffes, MD [facility] 4:33 PM

## 2018-05-12 NOTE — Progress Notes (Signed)
PROGRESS NOTE  Summer Jones LGX:[id number] DOB: [birth year] DOA: 05/08/2018 PCP: Crist Infante, MD  HPI/Recap of past 24 hours: 88 WF history of gastric outlet obstruction-- gastro-jejunostomy on 11/2017,  history of CBD obstruction status post biliary stents,  pancreatic cancer with possible liver mets,  hypertension  Admit 9/22 RUQ abdominal pain, with associated distention, nausea, poor p.o. intake for the past 3 days.    In the ED, CT abdomen and ultrasound showed possible acute cholecystitis, small amount of ascites, with known pancreatic cancer with mets to the liver.  Patient admitted for further management.  General surgery on consult.   SUBJ Pain is 3-4/10 today-just had liquid tylenol-no fever-eating sparsely--drinking ensure-no cp  Walked and felt "wobbly" and unsteady Scared to eat as worried about vomiting  Assessment/Plan: Principal Problem:   Cholecystitis Active Problems:   Hyponatremia   Essential hypertension   Pancreatic cancer metastasized to liver [facility])   Palliative care by specialist   Pain of metastatic malignancy   Goals of care, counseling/discussion   Advance care planning   Malnutrition of moderate degree  Acute cholecystitis s/p percutaneous cholecystostomy on 05/09/18 by IR Afebrile, wbc 18-->13 Surgical deep wound culture pending CT abdomen/pelvis showed persistent gallbladder dilatation with thickened enhancing gallbladder wall, suggesting cholecystitis General surgery consulted, noted to be a poor surgical candidate--s/p perc chole drain 9/23--Needs drain clinic appT?--will forward to IR Continue IV Zosyn-->Augmentin for 2 weeks per Dr. Barry Dienes who will follow in OP Pain management--no changes today CLD, advance as tolerated  Mild hyponatremia Likely 2/2 dehydration and stable in 134 range Continue IVF Daily BMP  Normocytic anemia 2/2 likely malignancy Hgb stable Anemia panel showed iron 5, sats 3, ferritin 419, folate 14, Vit B12  2,366 S/P 1 dose of feraheme on 05/10/18  Hypothyroidism Continue synthroid  Metastatic pancreatic CA to the liver Planned to start palliative rad on 05/09/18 Hold off for now See IR notes  Sinus tach 2/2 cancer Get EKG today--might have sinus tach or afib--unclear Start metoprolol 12.5 bid  HTn holding amlodipine and lisnopril for now  Code Status: DNR  Family Communication: d/w son  Disposition Plan: Once significant improvement--she might be ready as early as tomorroe 9/27 for d/r   Consultants:  Gen Surg  IR  Procedures: Percutaneous cholecystostomy on 05/09/18 by IR  Antimicrobials:  Zosyn  DVT prophylaxis: Lovenox   Objective: Vitals:   05/11/18 1552 05/11/18 2153 05/12/18 0140 05/12/18 0503  BP: 104/78 115/84  109/70  Pulse: 68 (!) 159 (!) 140 (!) 120  Resp: 18 20  20   Temp: 97.8 F (36.6 C) 97.8 F (36.6 C)  97.7 F (36.5 C)  TempSrc: Oral Oral  Oral  SpO2: 91% 99%  100%  Weight:      Height:        Intake/Output Summary (Last 24 hours) at 05/12/2018 1415 Last data filed at 05/12/2018 0710 Gross per 24 hour  Intake -  Output 750 ml  Net -750 ml   Filed Weights   05/08/18 1335  Weight: 35.7 kg    Exam:   pleasant in nad eomi frial and cachectic Bi-temporal wasting abd scaphoid-drain in RUQ No le edema Neuro intact coherent mentall--power 5/5  Data Reviewed: CBC: Recent Labs  Lab 05/08/18 1452 05/09/18 0420 05/10/18 0320 05/11/18 0354 05/12/18 0418  WBC 8.1 9.5 18.0* 13.0* 12.0*  NEUTROABS 7.4  --  17.0* 12.1* 10.8*  HGB 9.9* 9.3* 10.2* 10.0* 9.9*  HCT 28.9* 27.7* 30.0* 29.2* 28.3*  MCV 86.8  88.2 86.5 84.1 83.2  PLT 301 332 433* 420* 631   Basic Metabolic Panel: Recent Labs  Lab 05/08/18 2052 05/09/18 0420 05/10/18 0320 05/11/18 0354 05/12/18 0418  NA 133* 133* 133* 134* 132*  K 3.8 3.7 3.8 2.7* 3.3*  CL 99 102 102 104 103  CO2 22 20* 22 21* 21*  GLUCOSE 84 63* 192* 119* 123*  BUN 9 12 15 14 9   CREATININE  0.58 0.63 0.70 0.48 0.48  CALCIUM 8.2* 7.8* 8.1* 8.2* 7.9*   GFR: Estimated Creatinine Clearance: 28.4 mL/min (by C-G formula based on SCr of 0.48 mg/dL). Liver Function Tests: Recent Labs  Lab 05/08/18 1420 05/09/18 0420  AST 29 41  ALT 21 20  ALKPHOS 223* 180*  BILITOT 1.4* 1.1  PROT 5.9* 5.3*  ALBUMIN 2.8* 2.3*   Recent Labs  Lab 05/08/18 1420  LIPASE 16   No results for input(s): AMMONIA in the last 168 hours. Coagulation Profile: Recent Labs  Lab 05/09/18 1540  INR 1.61   Cardiac Enzymes: No results for input(s): CKTOTAL, CKMB, CKMBINDEX, TROPONINI in the last 168 hours. BNP (last 3 results) No results for input(s): PROBNP in the last 8760 hours. HbA1C: No results for input(s): HGBA1C in the last 72 hours. CBG: No results for input(s): GLUCAP in the last 168 hours. Lipid Profile: No results for input(s): CHOL, HDL, LDLCALC, TRIG, CHOLHDL, LDLDIRECT in the last 72 hours. Thyroid Function Tests: No results for input(s): TSH, T4TOTAL, FREET4, T3FREE, THYROIDAB in the last 72 hours. Anemia Panel: Recent Labs    05/10/18 0320  VITAMINB12 2,366*  FOLATE 14.0  FERRITIN 419*  TIBC 170*  IRON 5*   Urine analysis:    Component Value Date/Time   COLORURINE STRAW (A) 05/08/2018 1400   APPEARANCEUR CLEAR 05/08/2018 1400   LABSPEC 1.005 05/08/2018 1400   PHURINE 6.0 05/08/2018 1400   GLUCOSEU NEGATIVE 05/08/2018 1400   HGBUR NEGATIVE 05/08/2018 1400   BILIRUBINUR NEGATIVE 05/08/2018 1400   KETONESUR NEGATIVE 05/08/2018 1400   PROTEINUR NEGATIVE 05/08/2018 1400   NITRITE NEGATIVE 05/08/2018 1400   LEUKOCYTESUR NEGATIVE 05/08/2018 1400   Sepsis Labs: @LABRCNTIP (procalcitonin:4,lacticidven:4)  ) Recent Results (from the past 240 hour(s))  Aerobic/Anaerobic Culture (surgical/deep wound)     Status: None (Preliminary result)   Collection Time: 05/09/18  5:53 PM  Result Value Ref Range Status   Specimen Description   Final    WOUND Performed at [facility], [place] [address] [place], [place] [address]    Special Requests   Final    Normal Performed at [facility], [place] [address] [place], [place] [address]    Gram Stain   Final    MODERATE WBC PRESENT, PREDOMINANTLY PMN ABUNDANT GRAM POSITIVE COCCI ABUNDANT GRAM NEGATIVE RODS ABUNDANT GRAM POSITIVE RODS Performed at [facility], [place] [address], [place], [place] [address]    Culture   Final    FEW KLEBSIELLA OXYTOCA ABUNDANT VIRIDANS STREPTOCOCCUS CULTURE REINCUBATED FOR BETTER GROWTH NO ANAEROBES ISOLATED; CULTURE IN PROGRESS FOR 5 DAYS    Report Status PENDING  Incomplete   Organism ID, Bacteria KLEBSIELLA OXYTOCA  Final      Susceptibility   Klebsiella oxytoca - MIC*    AMPICILLIN RESISTANT Resistant     CEFAZOLIN <=4 SENSITIVE Sensitive     CEFEPIME <=1 SENSITIVE Sensitive     CEFTAZIDIME <=1 SENSITIVE Sensitive     CEFTRIAXONE <=1 SENSITIVE Sensitive     CIPROFLOXACIN <=0.25 SENSITIVE Sensitive  GENTAMICIN <=1 SENSITIVE Sensitive     IMIPENEM <=0.25 SENSITIVE Sensitive     TRIMETH/SULFA <=20 SENSITIVE Sensitive     AMPICILLIN/SULBACTAM <=2 SENSITIVE Sensitive     PIP/TAZO <=4 SENSITIVE Sensitive     Extended ESBL NEGATIVE Sensitive     * FEW KLEBSIELLA OXYTOCA      Studies: No results found.  Scheduled Meds: . acetaminophen (TYLENOL) oral liquid 160 mg/5 mL  650 mg Oral Q6H  . amoxicillin-clavulanate  1 tablet Oral Q12H  . docusate sodium  100 mg Oral Daily  . enoxaparin (LOVENOX) injection  30 mg Subcutaneous QHS  . feeding supplement (ENSURE ENLIVE)  237 mL Oral BID BM  . levothyroxine  25 mcg Oral QAC breakfast  . metoprolol tartrate  12.5 mg Oral BID  . polyethylene glycol  17 g Oral Daily  . sodium chloride flush  5 mL Intracatheter Q8H    Continuous Infusions: . sodium chloride Stopped (05/09/18 1709)  . sodium chloride 75 mL/hr at 05/12/18 1316     LOS: 4 days     Nita Sells, MD [facility]   If 7PM-7AM, please contact night-coverage [email or web address] 05/12/2018, 2:15 PM

## 2018-05-12 NOTE — Progress Notes (Signed)
CSW met with Summer Jones and her son [name] at bedside following up on conversation re: disposition planning. Summer Jones reports she is interested in pursuing snf for short term rehab. Requested referrals to several facilities to consider all facility options available. CSW obtained pasrr and completed FL2 for referrals. Will follow up with bed offers. Once facility selected, facility can initiate insurance authorization request.  Sharren Bridge, MSW, LCSW Clinical Social Work 05/12/2018 [phone]

## 2018-05-12 NOTE — Progress Notes (Signed)
The patient is improving clinically with WBC and pain while on abx with Ctube in place. We will hold off on radiotherapy until next week. She has not begun her palliative xrt to pancreas yet.      Carola Rhine, PAC

## 2018-05-12 NOTE — Final Consult Note (Addendum)
Consultant Final Sign-Off Note    Assessment/Final recommendations  Summer Jones is a 84 y.o. female followed by me for cholecystitis  She is okay from our standpoint for discharge to SNF  Wound care (if applicable):    Diet at discharge: regular diet is appropriate   Activity at discharge: per primary team   Follow-up appointment:  Dr. Barry Dienes in 3 weeks   Pending results:  Unresulted Labs (From admission, onward)    Start     Ordered   05/10/18 0500  CBC with Differential/Platelet  Daily,   R    Question:  Specimen collection method  Answer:  Lab=Lab collect   05/09/18 1304   05/10/18 7858  Basic metabolic panel  Daily,   R    Question:  Specimen collection method  Answer:  Lab=Lab collect   05/09/18 1304   05/08/18 1400  CBC with Differential  STAT,   STAT     05/08/18 1359           Medication recommendations:   Other recommendations:    Thank you for allowing [place] to participate in the care of your patient!  Please consult [place] again if you have further needs for your patient.  Kalman Drape 05/12/2018 7:46 AM    Subjective   CC; cholecystitis   Pt is sleeping and I woke her up. She is having very mild pain. No issues overnight. Husband at bedside,.   Objective  Vital signs in last 24 hours: Temp:  [97.7 F (36.5 C)-97.8 F (36.6 C)] 97.7 F (36.5 C) (09/26 0503) Pulse Rate:  [68-159] 120 (09/26 0503) Resp:  [18-20] 20 (09/26 0503) BP: (104-115)/(70-84) 109/70 (09/26 0503) SpO2:  [91 %-100 %] 100 % (09/26 0503)  PE: Gen: Alert, NAD, pleasant, cooperative, sleeping Pulm:Rate andeffort normal Abd: Soft,no distention, +BS,perc choledrain with scant brown cloudy output, no TTP, no peritonitis Skin: no rashes noted, warm and dry  Pertinent labs and Studies: Recent Labs    05/10/18 0320 05/11/18 0354 05/12/18 0418  WBC 18.0* 13.0* 12.0*  HGB 10.2* 10.0* 9.9*  HCT 30.0* 29.2* 28.3*   BMET Recent Labs    05/11/18 0354 05/12/18 0418   NA 134* 132*  K 2.7* 3.3*  CL 104 103  CO2 21* 21*  GLUCOSE 119* 123*  BUN 14 9  CREATININE 0.48 0.48  CALCIUM 8.2* 7.9*   No results for input(s): LABURIN in the last 72 hours. Results for orders placed or performed during the hospital encounter of 05/08/18  Aerobic/Anaerobic Culture (surgical/deep wound)     Status: None (Preliminary result)   Collection Time: 05/09/18  5:53 PM  Result Value Ref Range Status   Specimen Description   Final    WOUND Performed at [facility] [address] [place], [place] [address]    Special Requests   Final    Normal Performed at [facility], [place] [address], [place], [place] [address]    Gram Stain   Final    MODERATE WBC PRESENT, PREDOMINANTLY PMN ABUNDANT GRAM POSITIVE COCCI ABUNDANT GRAM NEGATIVE RODS ABUNDANT GRAM POSITIVE RODS Performed at [facility], [place] [address], [place], [place] [address]    Culture   Final    FEW KLEBSIELLA OXYTOCA ABUNDANT VIRIDANS STREPTOCOCCUS    Report Status PENDING  Incomplete    Imaging: No results found.

## 2018-05-12 NOTE — Progress Notes (Signed)
Daily Progress Note   Patient Name: Summer Jones       Date: 05/12/2018 DOB: 11/06/1929  Age: 84 y.o. MRN#: [id number] Attending Physician: Nita Sells, MD Primary Care Physician: Crist Infante, MD Admit Date: 05/08/2018  Reason for Consultation/Follow-up: Establishing goals of care  Subjective: Patient awake this morning. Sitting up in bed. Husband at bedside. Noted recommendations for SNF. Attempted to discuss with patient her preferences for SNF vs going home and offering [facility] discussion- patient stated she "really didn't want to get into it". She did note that it was important to her to "go home" but she wasn't sure "how I'm going to take care of myself". She stated she didn't feel she was ready for discharge, and would consider short term SNF for strengthening.  She denied pain. She stated her main goal is that she really doesn't want to take medicine. Attempted to to discuss alternative forms of pain and mood management- but patient declined.   ROS  Length of Stay: 4  Current Medications: Scheduled Meds:  . acetaminophen  650 mg Oral Q6H  . docusate sodium  100 mg Oral Daily  . enoxaparin (LOVENOX) injection  30 mg Subcutaneous QHS  . feeding supplement (ENSURE ENLIVE)  237 mL Oral BID BM  . levothyroxine  25 mcg Oral QAC breakfast  . polyethylene glycol  17 g Oral Daily  . sodium chloride flush  5 mL Intracatheter Q8H    Continuous Infusions: . sodium chloride Stopped (05/09/18 1709)  . sodium chloride 75 mL/hr at 05/11/18 0059  . piperacillin-tazobactam (ZOSYN)  IV 3.375 g (05/12/18 0954)    PRN Meds: sodium chloride, diphenhydrAMINE, ketorolac, LORazepam, morphine injection, ondansetron **OR** ondansetron (ZOFRAN) IV  Physical Exam          Vital Signs: BP  109/70 (BP Location: Right Arm)   Pulse (!) 120   Temp 97.7 F (36.5 C) (Oral)   Resp 20   Ht 4' 11.49" (1.511 m)   Wt 35.7 kg   SpO2 100%   BMI 15.66 kg/m  SpO2: SpO2: 100 % O2 Device: O2 Device: Nasal Cannula O2 Flow Rate: O2 Flow Rate (L/min): 2 L/min  Intake/output summary:   Intake/Output Summary (Last 24 hours) at 05/12/2018 1009 Last data filed at 05/12/2018 0710 Gross per 24 hour  Intake -  Output  750 ml  Net -750 ml   LBM: Last BM Date: 05/11/18 Baseline Weight: Weight: 35.7 kg Most recent weight: Weight: 35.7 kg       Palliative Assessment/Data: PPS: 20%      Patient Active Problem List   Diagnosis Date Noted  . Malnutrition of moderate degree 05/11/2018  . Palliative care by specialist   . Pain of metastatic malignancy   . Goals of care, counseling/discussion   . Advance care planning   . Acute cholecystitis 05/08/2018  . Pancreatic cancer metastasized to liver ([place]) 05/08/2018  . Rectal bleeding 04/07/2018  . Malignant neoplasm of head of pancreas ([place]) 05/03/2017  . Gastric outlet obstruction   . Abdominal pain 11/24/2016  . UTI (urinary tract infection) 11/24/2016  . Essential hypertension   . Common bile duct (CBD) obstruction   . Hyponatremia 08/13/2015  . Biliary obstruction 08/06/2015  . Anemia 08/06/2015  . Hypothyroidism 06/13/2013  . Diverticulosis of colon without hemorrhage 06/13/2013  . S/P hernia repair 06/13/2013    Palliative Care Assessment & Plan   Patient Profile: 84 y.o. female  with past medical history of pancreatic adenocarcinoma with peripancreatic adenopathy (s/p stent placement of CBD 08/2015 no malignancy, biopsy + 11/2016, no chemo treatments, was under careful observation until recently when she made the decision to proceed with palliative radiation that was to begin 9/23) admitted on 05/08/2018 with increasing abdominal pain. CT scan revealed new liver lesions, gall bladder dilatation. Clinically she appeared worrisome  for cholecystitis, but no surgical options d/t pancreatic head mass. Underwent placement of percutaneous chole drain on 9/23. Palliative medicine consulted for [place].   Assessment/Recommendations/Plan   Patient declines further discussion with PMT  Patient education handout for nonpharmocological pain relief- deep breathing techniques left for patient review  PMT will follow peripherally  Goals of Care and Additional Recommendations:  Limitations on Scope of Treatment: Full Scope Treatment  Code Status:  DNR  Prognosis:   Unable to determine  Discharge Planning:  To Be Determined  Care plan was discussed with patient and spouse.  Thank you for allowing the Palliative Medicine Team to assist in the care of this patient.   Time In: 0950 Time Out: 1025 Total Time 35 minutes Prolonged Time Billed no      Greater than 50%  of this time was spent counseling and coordinating care related to the above assessment and plan.  Mariana Kaufman, AGNP-C Palliative Medicine   Please contact Palliative Medicine Team phone at [phone] for questions and concerns.

## 2018-05-12 NOTE — Progress Notes (Signed)
Referring Physician(s): Dr. Barry Dienes  Supervising Physician: Jacqulynn Cadet  Patient Status:  [facility] - In-pt  Chief Complaint: Follow-up cholecystostomy placed 9/23 by Dr. Pascal Lux  Subjective:  84 y/o F with PMH significant for HTN, hypothyroidism, diverticular disease, pancreatic cancer and common bile duct obstruction. Summer Jones is previously known to IR due to placement of biliary drain on 08/08/15 and biliary stent placement in 2017, at which time biliary drain was removed. Summer Jones presented to [facility] ED on 9/22 with complaints of diffuse abdominal pain, nausea, constipation and poor appetite. CT abdomen/pelvis at that time showed persistent gallbladder dilatation with thickened enhancing gallbladder wall, suggesting cholecystitis. RUQ abdominal US showed distended gallbladder with wall thickening, sludge, positive sonographic Murphy sign and small amount pericholecystic fluid.   General surgery was consulted and Summer Jones was deemed a poor surgical candidate in setting of pancreatic head mass. IR was consulted for percutaneous cholecystostomy which was performed without complication on 0/25 by Dr. Pascal Lux.  Patient reports her appetite is "ok" but Summer Jones is nervous to eat too much because Summer Jones does not want to vomit. Summer Jones denies any recent nausea or vomiting. Summer Jones states Summer Jones "has good days and bad days, we'll see what today is." Summer Jones states abdominal pain has improved some but still present. Husband reports this morning was first solid food patient has tolerated.  Allergies: Sulfamethoxazole  Medications: Prior to Admission medications   Medication Sig Start Date End Date Taking? Authorizing Provider  amLODipine (NORVASC) 10 MG tablet Take 10 mg by mouth at bedtime.  06/16/15  Yes [provider]  hydrocortisone (ANUSOL-HC) 2.5 % rectal cream Place 1 application rectally 2 (two) times daily as needed for hemorrhoids or anal itching. 03/22/18  Yes Alla Feeling, NP  ibuprofen (ADVIL,MOTRIN) 100 MG  chewable tablet Chew 200 mg by mouth daily as needed for mild pain.   Yes [provider]  levothyroxine (SYNTHROID, LEVOTHROID) 25 MCG tablet Take 25 mcg by mouth daily before breakfast.   Yes [provider]  lipase/protease/amylase (CREON) 12000 units CPEP capsule Take 2 capsules (24,000 Units total) by mouth 3 (three) times daily before meals. Patient taking differently: Take 24,000 Units by mouth 2 (two) times daily.  07/29/17  Yes Truitt Merle, MD  lisinopril (PRINIVIL,ZESTRIL) 5 MG tablet Take 5 mg by mouth at bedtime.    Yes [provider]  LORazepam (ATIVAN) 0.5 MG tablet Take 0.5 tablets (0.25 mg total) by mouth every 6 (six) hours as needed for anxiety (or nausea). 05/02/18  Yes Hayden Pedro, PA-C  Multiple Vitamins-Minerals (MULTIVITAMIN GUMMIES ADULT) CHEW Chew 1 tablet by mouth daily.   Yes [provider]  polyethylene glycol powder (GLYCOLAX/MIRALAX) powder Take 17 g by mouth daily as needed for mild constipation.    Yes [provider]  traMADol (ULTRAM) 50 MG tablet Take 1 tablet (50 mg total) by mouth every 6 (six) hours as needed. Patient taking differently: Take 50 mg by mouth every 6 (six) hours as needed for moderate pain.  08/16/17  Yes Truitt Merle, MD  Cholecalciferol (VITAMIN D3) 400 units CAPS Take 2 capsules by mouth daily.    [provider]     Vital Signs: BP 109/70 (BP Location: Right Arm)   Pulse (!) 120   Temp 97.7 F (36.5 C) (Oral)   Resp 20   Ht 4' 11.49" (1.511 m)   Wt 78 lb 12.8 oz (35.7 kg)   SpO2 100%   BMI 15.66 kg/m   Physical Exam  Constitutional: No distress.  Sitting up eating breakfast  Cardiovascular: Normal rate, regular rhythm and normal heart sounds.  Pulmonary/Chest: Effort normal and breath sounds normal.  Abdominal: Soft. Summer Jones exhibits no distension. There is tenderness (at cholecystostomy insertion site; minimal).  Scant bilious drainage in gravity bag, flushes aspirates  easily, insertion site is clean/dry/intact.   Neurological: Summer Jones is alert.  Skin: Skin is warm and dry. Summer Jones is not diaphoretic.  Psychiatric: Summer Jones has a normal mood and affect. Her behavior is normal.  Nursing note and vitals reviewed.   Imaging: Ct Abdomen Pelvis W Contrast  Result Date: 05/08/2018 CLINICAL DATA:  Pancreatic adenocarcinoma. Abdominal pain Distention. Gastrojejunostomy created 11/27/2016. EXAM: CT ABDOMEN AND PELVIS WITH CONTRAST TECHNIQUE: Multidetector CT imaging of the abdomen and pelvis was performed using the standard protocol following bolus administration of intravenous contrast. CONTRAST:  174mL ISOVUE-300 IOPAMIDOL (ISOVUE-300) INJECTION 61% COMPARISON:  03/29/2018 and previous FINDINGS: Lower chest: Trace pleural effusions right greater than left, new since previous. Minimal dependent atelectasis posteriorly in the right lung base. Moderate hiatal hernia. Hepatobiliary: Stable metallic biliary stent. No intrahepatic biliary ductal dilatation. Thick-walled gallbladder with mucosal enhancement. Small low-attenuation liver lesions. 8 mm low-attenuation lesion in the inferior right lobe, image 28/2, not present on prior studies. Ill-defined sub 7 mm low-attenuation focus in segment 8 image 12/2, not evident on prior studies. Pancreas: Dilatation of pancreatic duct if parenchymal atrophy. Little convincing change in the ill-defined soft tissue attenuation in the region the pancreatic head, margins indistinct. Spleen: Normal in size without focal abnormality. Adrenals/Urinary Tract: Normal adrenal glands. Unremarkable kidneys. Marked distention of the urinary bladder. Stomach/Bowel: Moderate hiatal hernia. Changes of gastrojejunostomy. Stomach is nondilated. Small bowel nondilated. Appendix not identified. Colon decompressed, unremarkable. Vascular/Lymphatic: Mild scattered calcified atheromatous plaque. No aortic aneurysm. High-grade tapered narrowing near the portosplenic confluence.  Reproductive: Uterus and bilateral adnexa are unremarkable. Other: New scattered mild perihepatic, perisplenic, and pelvic ascites. No free air. Musculoskeletal: Lumbar levoscoliosis with multilevel spondylitic change. Grade 1 anterolisthesis L5-S1 probably secondary to facet DJD. No fracture or worrisome bone lesion. IMPRESSION: 1. Small amount of abdominal and pelvic ascites, new since previous. 2. Marked distention of the urinary bladder 3. New small bilateral pleural effusions 4. Persistent gallbladder dilatation with thickened enhancing gallbladder wall, suggesting cholecystitis. 5. New low-attenuation liver lesions suggesting metastatic disease. Electronically Signed   By: Lucrezia Europe M.D.   On: 05/08/2018 16:37   Ir Perc Cholecystostomy  Result Date: 05/09/2018 INDICATION: Acute cholecystitis, poor operative candidate History of pancreatic cancer and remote history of percutaneous biliary stent placement, now with acute cholecystitis. Please perform image guided cholecystostomy tube for infection source control purposes. Note, prolonged conversations were held the patient and the patient's family regarding the high likelihood that this cholecystostomy tube will likely be permanent given patient's multiple medical comorbidities. EXAM: ULTRASOUND AND FLUOROSCOPIC-GUIDED CHOLECYSTOSTOMY TUBE PLACEMENT COMPARISON:  CT abdomen pelvis-05/08/2018; right upper quadrant abdominal ultrasound-05/08/2018; percutaneous cholangiogram prior to removal of external biliary drainage catheter - 09/12/2015 MEDICATIONS: The patient is currently admitted to the hospital and on intravenous antibiotics. Antibiotics were administered within an appropriate time frame prior to skin puncture. ANESTHESIA/SEDATION: Moderate (conscious) sedation was employed during this procedure. A total of Versed 1 mg and Fentanyl 50 mcg was administered intravenously. Moderate Sedation Time: 16 minutes. The patient's level of consciousness and vital  signs were monitored continuously by radiology nursing throughout the procedure under my direct supervision. CONTRAST:  10 cc Isovue-300 - administered into the gallbladder fossa. FLUOROSCOPY TIME:  1  minutes 18 seconds (6.3 mGy) COMPLICATIONS: None immediate. PROCEDURE: Informed written consent was obtained from the patient and the patient's family after a discussion of the risks, benefits and alternatives to treatment. Questions regarding the procedure were encouraged and answered. A timeout was performed prior to the initiation of the procedure. The right upper abdominal quadrant was prepped and draped in the usual sterile fashion, and a sterile drape was applied covering the operative field. Maximum barrier sterile technique with sterile gowns and gloves were used for the procedure. A timeout was performed prior to the initiation of the procedure. Local anesthesia was provided with 1% lidocaine with epinephrine. Ultrasound scanning of the right upper quadrant demonstrates a markedly dilated gallbladder. Of note, the patient reported pain with ultrasound imaging over the gallbladder. Utilizing a transhepatic approach, a 22 gauge needle was advanced into the gallbladder under direct ultrasound guidance. An ultrasound image was saved for documentation purposes. Appropriate intraluminal puncture was confirmed with the efflux of bile and advancement of an 0.018 wire into the gallbladder lumen. The needle was exchanged for an [employer] set. A small amount of contrast was injected to confirm appropriate intraluminal positioning. Over a Benson wire, a 67.2-French Cook cholecystomy tube was advanced into the gallbladder fossa, coiled and locked. Bile was aspirated and a small amount of contrast was injected as several post procedural spot radiographic images were obtained in various obliquities. The catheter was secured to the skin with suture, connected to a drainage bag and a dressing was placed. The patient tolerated  the procedure well without immediate post procedural complication. IMPRESSION: Successful ultrasound and fluoroscopic guided placement of a 10.2 French cholecystostomy tube. Electronically Signed   By: Sandi Mariscal M.D.   On: 05/09/2018 17:57   US Abdomen Limited Ruq  Result Date: 05/08/2018 CLINICAL DATA:  84 year old female with RIGHT UPPER quadrant abdominal pain and nausea for 3 days. History of pancreatic cancer. EXAM: ULTRASOUND ABDOMEN LIMITED RIGHT UPPER QUADRANT COMPARISON:  05/08/2018 CT FINDINGS: Gallbladder: Distended gallbladder with marked wall thickening noted. Gallbladder sludge identified. A positive sonographic Percell Miller sign is noted as well as a small amount of pericholecystic fluid. Common bile duct: Diameter: 6 mm. Liver: No definite focal abnormalities. The abnormalities identified on CT are not well visualized sonographically. Portal vein is patent on color Doppler imaging with normal direction of blood flow towards the liver. Small amount of ascites noted. IMPRESSION: Distended gallbladder with gallbladder wall thickening, pericholecystic fluid and positive sonographic Murphy sign compatible with acute cholecystitis. No biliary dilatation. Small amount of ascites. Electronically Signed   By: Margarette Canada M.D.   On: 05/08/2018 17:48    Labs:  CBC: Recent Labs    05/09/18 0420 05/10/18 0320 05/11/18 0354 05/12/18 0418  WBC 9.5 18.0* 13.0* 12.0*  HGB 9.3* 10.2* 10.0* 9.9*  HCT 27.7* 30.0* 29.2* 28.3*  PLT 332 433* 420* 386    COAGS: Recent Labs    05/19/17 0944 05/09/18 1540  INR 0.89 1.61  APTT 28  --     BMP: Recent Labs    05/09/18 0420 05/10/18 0320 05/11/18 0354 05/12/18 0418  NA 133* 133* 134* 132*  K 3.7 3.8 2.7* 3.3*  CL 102 102 104 103  CO2 20* 22 21* 21*  GLUCOSE 63* 192* 119* 123*  BUN 12 15 14 9   CALCIUM 7.8* 8.1* 8.2* 7.9*  CREATININE 0.63 0.70 0.48 0.48  GFRNONAA >60 >60 >60 >60  GFRAA >60 >60 >60 >60    LIVER FUNCTION TESTS: Recent  Labs  03/15/18 1409 03/22/18 1614 05/08/18 1420 05/09/18 0420  BILITOT 0.4 0.6 1.4* 1.1  AST 38 39 29 41  ALT 37 38 21 20  ALKPHOS 187* 229* 223* 180*  PROT 7.5 7.4 5.9* 5.3*  ALBUMIN 4.3 4.2 2.8* 2.3*    Assessment and Plan:  Acute cholecystitis s/p cholecystostomy placement in IR by Dr. Pascal Lux on 9/23. Patient reports abdominal pain has improved somewhat, appetite improved but patient hesitant to eat solid foods for fear of vomiting. Summer Jones denies any nausea or vomiting.  Drain with 50 cc output in last 24 hours. WBC improved to 12.0, H/H decreased slightly to 9.9/28.3, patient afebrile this morning, hypotensive and tachycardic but stable currently. K+ with critical value of 2.7 yesterday, 3.3 after K+ replacement. Cultures of aspirate show klebsiella oxytoca/virdians streptococcus; Summer Jones continues on IV Zosyn.  Per chart patient likely to be d/ced to SNF shortly however family is unsure if they would like to go to SNF or for her to remain at home.   IR to continue to follow while inpatient, will schedule outpatient follow up in IR clinic upon discharge for cholangiogram/drain exchange in 4-6 weeks. Discharge per admitting service. Continue TID flushes with 5 cc NS and record output from drain.  Please call IR with questions or concerns.   Electronically Signed: Joaquim Nam, PA-C 05/12/2018, 10:16 AM   I spent a total of 25 Minutes at the the patient's bedside AND on the patient's hospital floor or unit, greater than 50% of which was counseling/coordinating care for cholecystostomy.

## 2018-05-12 NOTE — NC FL2 (Signed)
[place] MEDICAID FL2 LEVEL OF CARE SCREENING TOOL     IDENTIFICATION  Patient Name: Summer Jones Birthdate: August 05, 1930 Sex: female Admission Date (Current Location): 05/08/2018  [facility] and [place] Number:  Herbalist and Address:  [facility],  [place] [address], [place]      Provider Number: [id number]  Attending Physician Name and Address:  Nita Sells, MD  Relative Name and Phone Number:       Current Level of Care: Hospital Recommended Level of Care: [place] Prior Approval Number:    Date Approved/Denied:   PASRR Number: [id number]  Discharge Plan: SNF    Current Diagnoses: Patient Active Problem List   Diagnosis Date Noted  . Malnutrition of moderate degree 05/11/2018  . Palliative care by specialist   . Pain of metastatic malignancy   . Goals of care, counseling/discussion   . Advance care planning   . Cholecystitis 05/08/2018  . Pancreatic cancer metastasized to liver ([place]) 05/08/2018  . Rectal bleeding 04/07/2018  . Malignant neoplasm of head of pancreas ([place]) 05/03/2017  . Gastric outlet obstruction   . Abdominal pain 11/24/2016  . UTI (urinary tract infection) 11/24/2016  . Essential hypertension   . Common bile duct (CBD) obstruction   . Hyponatremia 08/13/2015  . Biliary obstruction 08/06/2015  . Anemia 08/06/2015  . Hypothyroidism 06/13/2013  . Diverticulosis of colon without hemorrhage 06/13/2013  . S/P hernia repair 06/13/2013    Orientation RESPIRATION BLADDER Height & Weight     Self, Time, Situation  O2(2L) Continent Weight: 78 lb 12.8 oz (35.7 kg) Height:  4' 11.49" (151.1 cm)  BEHAVIORAL SYMPTOMS/MOOD NEUROLOGICAL BOWEL NUTRITION STATUS      Continent Diet(regular diet)  AMBULATORY STATUS COMMUNICATION OF NEEDS Skin   Limited Assist Verbally Normal                       Personal Care Assistance Level of Assistance  Bathing, Feeding, Dressing Bathing Assistance:  Maximum assistance Feeding assistance: Limited assistance Dressing Assistance: Independent     Functional Limitations Info  Sight, Hearing, Speech Sight Info: Adequate Hearing Info: Adequate Speech Info: Adequate    SPECIAL CARE FACTORS FREQUENCY  PT (By licensed PT), OT (By licensed OT)     PT Frequency: 5x OT Frequency: 5x            Contractures Contractures Info: Not present    Additional Factors Info  Code Status, Allergies Code Status Info: DNR Allergies Info: Sulfamethoxazole           Current Medications (05/12/2018):  This is the current hospital active medication list Current Facility-Administered Medications  Medication Dose Route Frequency Provider Last Rate Last Dose  . 0.9 %  sodium chloride infusion   Intravenous PRN Etta Quill, DO   Stopped at 05/09/18 1709  . 0.9 %  sodium chloride infusion   Intravenous Continuous Alma Friendly, MD 75 mL/hr at 05/12/18 1316    . acetaminophen (TYLENOL) solution 650 mg  650 mg Oral Q6H Ulice Dash D, RPH   650 mg at 05/12/18 1316  . amoxicillin-clavulanate (AUGMENTIN) 875-125 MG per tablet 1 tablet  1 tablet Oral Q12H Samtani, Jai-Gurmukh, MD      . diltiazem (CARDIZEM) tablet 30 mg  30 mg Oral Q8H Samtani, Jai-Gurmukh, MD      . diphenhydrAMINE (BENADRYL) capsule 25 mg  25 mg Oral Q6H PRN Lovey Newcomer T, NP   25 mg at 05/09/18 0210  .  docusate sodium (COLACE) capsule 100 mg  100 mg Oral Daily Focht, Jessica L, PA   100 mg at 05/11/18 1005  . enoxaparin (LOVENOX) injection 30 mg  30 mg Subcutaneous QHS Docia Barrier, PA   30 mg at 05/11/18 2144  . feeding supplement (ENSURE ENLIVE) (ENSURE ENLIVE) liquid 237 mL  237 mL Oral BID BM Focht, Jessica L, PA   237 mL at 05/12/18 1521  . ketorolac (TORADOL) 15 MG/ML injection 15 mg  15 mg Intravenous Q6H PRN Alma Friendly, MD   15 mg at 05/11/18 0132  . levothyroxine (SYNTHROID, LEVOTHROID) tablet 25 mcg  25 mcg Oral QAC breakfast Etta Quill, DO   25 mcg at 05/12/18 8867  . LORazepam (ATIVAN) tablet 0.25 mg  0.25 mg Oral Q6H PRN Etta Quill, DO      . morphine 2 MG/ML injection 2-4 mg  2-4 mg Intravenous Q4H PRN Etta Quill, DO   4 mg at 05/09/18 0144  . ondansetron (ZOFRAN) tablet 4 mg  4 mg Oral Q6H PRN Etta Quill, DO       Or  . ondansetron [facility]) injection 4 mg  4 mg Intravenous Q6H PRN Etta Quill, DO      . polyethylene glycol (MIRALAX / GLYCOLAX) packet 17 g  17 g Oral Daily Focht, Jessica L, PA   17 g at 05/11/18 2144  . sodium chloride flush (NS) 0.9 % injection 5 mL  5 mL Intracatheter Q8H Sandi Mariscal, MD   5 mL at 05/12/18 7373     Discharge Medications: Please see discharge summary for a list of discharge medications.  Relevant Imaging Results:  Relevant Lab Results:   Additional Information SS# [id number]. Has radiation appointments at [facility]  [facility], LCSW

## 2018-05-12 NOTE — Consult Note (Signed)
Referring Physician: Sharmon Revere Perini  Summer Jones is an 84 y.o. female.                       Chief Complaint: Atrial fibrillation with RVR  HPI: 84 year old female with recent percutaneous cholecystostomy for acute cholecystitis has atrial fibrillation with RVR. She has few episodes of palpitations in past. She also has Pancreatic cancer with mets to liver, hypertension, iron deficiency anemia, and protein calorie malnutrition with severe hypoalbuminemia.  Past Medical History:  Diagnosis Date  . Arthritis   . Common bile duct (CBD) obstruction   . Difficult intubation Anterior glottis, short TMD, limited mouth opening. Required glidescope and bougie stylet with multiple attempts.  . Diverticular disease   . Diverticulosis   . Essential hypertension   . Hypertension   . Hypothyroidism   . Jaundice   . Mitral valve prolapse   . Pancreatic cancer ([place]) 05/2017  . Thyroid disease       Past Surgical History:  Procedure Laterality Date  . BREAST BIOPSY Right   . BREAST CYST EXCISION Right   . ENDOSCOPIC RETROGRADE CHOLANGIOPANCREATOGRAPHY (ERCP) WITH PROPOFOL N/A 08/08/2015   Procedure: ENDOSCOPIC RETROGRADE CHOLANGIOPANCREATOGRAPHY (ERCP) WITH PROPOFOL;  Surgeon: [name], MD;  Location: [facility];  Service: Endoscopy;  Laterality: N/A;  . 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 ESOPHAGOGASTRODUODENOSCOPY (EGD) WITH PROPOFOL N/A 11/25/2016   Procedure: ESOPHAGOGASTRODUODENOSCOPY (EGD) WITH PROPOFOL;  Surgeon: [name], MD;  Location: [facility];  Service: Endoscopy;  Laterality: N/A;  . EUS N/A 08/08/2015   Procedure: UPPER ENDOSCOPIC ULTRASOUND (EUS) RADIAL;  Surgeon: [name], MD;   Location: [facility];  Service: Endoscopy;  Laterality: N/A;  . INGUINAL HERNIA REPAIR     right  . IR PERC CHOLECYSTOSTOMY  05/09/2018  . LAPAROTOMY N/A 11/27/2016   Procedure: GASTROJEJUNOSTOMY;  Surgeon: Leighton Ruff, MD;  Location: [facility];  Service: General;  Laterality: N/A;  . PANCREAS BIOPSY  11/27/2016   Procedure: PANCREACTIC BIOPSY;  Surgeon: Leighton Ruff, MD;  Location: [facility];  Service: General;;  . Bess Kinds CHOLANGIOSCOPY N/A 12/26/2015   Procedure: KDXIPJAS CHOLANGIOSCOPY;  Surgeon: [name], MD;  Location[facility];  Service: Endoscopy;  Laterality: N/A;  . TONSILLECTOMY      Family History  Problem Relation Age of Onset  . Breast cancer Sister 39       treated with radiation  . Heart disease Father   . Colon cancer Neg Hx    Social History:  reports that she quit smoking about 62 years ago. Her smoking use included cigarettes. She has a 10.00 pack-year smoking history. She has never used smokeless tobacco. She reports that she does not drink alcohol or use drugs.  Allergies:  Allergies  Allergen Reactions  . Sulfamethoxazole Nausea Only    Medications Prior to Admission  Medication Sig Dispense Refill  . amLODipine (NORVASC) 10 MG tablet Take 10 mg by mouth at bedtime.     . hydrocortisone (ANUSOL-HC) 2.5 % rectal cream Place 1 application rectally 2 (two) times daily as needed for hemorrhoids or anal itching. 30 g 0  . ibuprofen (ADVIL,MOTRIN) 100 MG chewable tablet Chew 200 mg by mouth daily as needed for mild pain.    Marland Kitchen  levothyroxine (SYNTHROID, LEVOTHROID) 25 MCG tablet Take 25 mcg by mouth daily before breakfast.    . lipase/protease/amylase (CREON) 12000 units CPEP capsule Take 2 capsules (24,000 Units total) by mouth 3 (three) times daily before meals. (Patient taking differently: Take 24,000 Units by mouth 2 (two) times daily. ) 540 capsule 1  . lisinopril (PRINIVIL,ZESTRIL) 5 MG tablet Take 5 mg by mouth at bedtime.     Marland Kitchen LORazepam (ATIVAN) 0.5  MG tablet Take 0.5 tablets (0.25 mg total) by mouth every 6 (six) hours as needed for anxiety (or nausea). 30 tablet 0  . Multiple Vitamins-Minerals (MULTIVITAMIN GUMMIES ADULT) CHEW Chew 1 tablet by mouth daily.    . polyethylene glycol powder (GLYCOLAX/MIRALAX) powder Take 17 g by mouth daily as needed for mild constipation.     . traMADol (ULTRAM) 50 MG tablet Take 1 tablet (50 mg total) by mouth every 6 (six) hours as needed. (Patient taking differently: Take 50 mg by mouth every 6 (six) hours as needed for moderate pain. ) 30 tablet 1  . Cholecalciferol (VITAMIN D3) 400 units CAPS Take 2 capsules by mouth daily.      Results for orders placed or performed during the hospital encounter of 05/08/18 (from the past 48 hour(s))  CBC with Differential/Platelet     Status: Abnormal   Collection Time: 05/11/18  3:54 AM  Result Value Ref Range   WBC 13.0 (H) 4.0 - 10.5 K/uL   RBC 3.47 (L) 3.87 - 5.11 MIL/uL   Hemoglobin 10.0 (L) 12.0 - 15.0 g/dL   HCT 29.2 (L) 36.0 - 46.0 %   MCV 84.1 78.0 - 100.0 fL   MCH 28.8 26.0 - 34.0 pg   MCHC 34.2 30.0 - 36.0 g/dL   RDW 13.9 11.5 - 15.5 %   Platelets 420 (H) 150 - 400 K/uL   Neutrophils Relative % 93 %   Neutro Abs 12.1 (H) 1.7 - 7.7 K/uL   Lymphocytes Relative 3 %   Lymphs Abs 0.4 (L) 0.7 - 4.0 K/uL   Monocytes Relative 4 %   Monocytes Absolute 0.5 0.1 - 1.0 K/uL   Eosinophils Relative 0 %   Eosinophils Absolute 0.0 0.0 - 0.7 K/uL   Basophils Relative 0 %   Basophils Absolute 0.0 0.0 - 0.1 K/uL    Comment: Performed at [facility], [place] [address] [place], [place] [address]  Basic metabolic panel     Status: Abnormal   Collection Time: 05/11/18  3:54 AM  Result Value Ref Range   Sodium 134 (L) 135 - 145 mmol/L   Potassium 2.7 (LL) 3.5 - 5.1 mmol/L    Comment: DELTA CHECK NOTED REPEATED TO VERIFY CRITICAL RESULT CALLED TO, READ BACK BY AND VERIFIED WITHMargarita Grizzle RN [id number] 05/11/18 A NAVARRO    Chloride 104 98 - 111  mmol/L   CO2 21 (L) 22 - 32 mmol/L   Glucose, Bld 119 (H) 70 - 99 mg/dL   BUN 14 8 - 23 mg/dL   Creatinine, Ser 0.48 0.44 - 1.00 mg/dL   Calcium 8.2 (L) 8.9 - 10.3 mg/dL   GFR calc non Af Amer >60 >60 mL/min   GFR calc Af Amer >60 >60 mL/min    Comment: (NOTE) The eGFR has been calculated using the CKD EPI equation. This calculation has not been validated in all clinical situations. eGFR's persistently <60 mL/min signify possible Chronic Kidney Disease.    Anion gap 9 5 - 15    Comment: Performed  at [facility], [place] [address] [place], [place] [address]  CBC with Differential/Platelet     Status: Abnormal   Collection Time: 05/12/18  4:18 AM  Result Value Ref Range   WBC 12.0 (H) 4.0 - 10.5 K/uL   RBC 3.40 (L) 3.87 - 5.11 MIL/uL   Hemoglobin 9.9 (L) 12.0 - 15.0 g/dL   HCT 28.3 (L) 36.0 - 46.0 %   MCV 83.2 78.0 - 100.0 fL   MCH 29.1 26.0 - 34.0 pg   MCHC 35.0 30.0 - 36.0 g/dL   RDW 13.8 11.5 - 15.5 %   Platelets 386 150 - 400 K/uL   Neutrophils Relative % 90 %   Neutro Abs 10.8 (H) 1.7 - 7.7 K/uL   Lymphocytes Relative 4 %   Lymphs Abs 0.5 (L) 0.7 - 4.0 K/uL   Monocytes Relative 5 %   Monocytes Absolute 0.6 0.1 - 1.0 K/uL   Eosinophils Relative 1 %   Eosinophils Absolute 0.1 0.0 - 0.7 K/uL   Basophils Relative 0 %   Basophils Absolute 0.0 0.0 - 0.1 K/uL    Comment: Performed at [facility], [place] [address], [place], [place] [address]  Basic metabolic panel     Status: Abnormal   Collection Time: 05/12/18  4:18 AM  Result Value Ref Range   Sodium 132 (L) 135 - 145 mmol/L   Potassium 3.3 (L) 3.5 - 5.1 mmol/L    Comment: DELTA CHECK NOTED NO VISIBLE HEMOLYSIS    Chloride 103 98 - 111 mmol/L   CO2 21 (L) 22 - 32 mmol/L   Glucose, Bld 123 (H) 70 - 99 mg/dL   BUN 9 8 - 23 mg/dL   Creatinine, Ser 0.48 0.44 - 1.00 mg/dL   Calcium 7.9 (L) 8.9 - 10.3 mg/dL   GFR calc non Af Amer >60 >60 mL/min   GFR calc Af Amer >60 >60 mL/min     Comment: (NOTE) The eGFR has been calculated using the CKD EPI equation. This calculation has not been validated in all clinical situations. eGFR's persistently <60 mL/min signify possible Chronic Kidney Disease.    Anion gap 8 5 - 15    Comment: Performed at [facility], [place] [address], [place], [place] [address]   No results found.  Review Of Systems Constitutional: Occasional fever, chills, weight loss. Eyes: No vision change, wears glasses. No discharge or pain. Ears: No hearing loss, No tinnitus. Respiratory: No asthma, COPD, pneumonias. No shortness of breath. No hemoptysis. Cardiovascular: No chest pain, occasional palpitation, leg edema. Gastrointestinal: No nausea, vomiting, diarrhea, constipation. No GI bleed. No hepatitis. Genitourinary: No dysuria, hematuria, kidney stone. No incontinance. Neurological: No headache, stroke, seizures.  Psychiatry: No psych facility admission for anxiety, depression, suicide. No detox. Skin: No rash. Musculoskeletal: Positive joint pain, fibromyalgia. No neck pain, back pain. Lymphadenopathy: No lymphadenopathy. Hematology: Positive anemia.   Blood pressure 109/61, pulse (!) 136, temperature 98.5 F (36.9 C), temperature source Oral, resp. rate 18, height 4' 11.49" (1.511 m), weight 35.7 kg, SpO2 96 %. Body mass index is 15.66 kg/m. General appearance: alert, cooperative, appears stated age and no distress Head: Normocephalic, atraumatic. Eyes: Blue eyes, pale pink conjunctiva, corneas clear. PERRL, EOM's intact. Neck: No adenopathy, no carotid bruit, no JVD, supple, symmetrical, trachea midline and thyroid not enlarged. Resp: Clear to auscultation bilaterally. Cardio: Irregular rate and rhythm, S1, S2 normal, II/VI systolic murmur, no click, rub or gallop GI: Soft, non-tender; bowel sounds normal. Extremities:  No edema, cyanosis or clubbing. Skin: Warm and dry.  Neurologic: Alert and oriented X 3, normal strength.  Normal coordination.  Assessment/Plan Atrial fibrillation with RVR, CHA2DS2VASc score 4 S/P acute cholecystitis S/P perc cholecystostomy Protein calorie malnutrition  One dose IV lanoxin followed by one dose metoprolol for heart rate control. Agree with with echocardiogram and TSH check. Continue low dose diltiazem as tolerated.  Birdie Riddle, MD  05/12/2018, 6:32 PM

## 2018-05-12 NOTE — Progress Notes (Signed)
Physical Therapy Treatment Patient Details Name: Summer Jones MRN: [id number] DOB: Mar 08, 1930 Today's Date: 05/12/2018    History of Present Illness 84 year old female with history of gastric outlet obstruction status post gastro-jejunostomy on 11/2017, history of CBD obstruction status post biliary stents, pancreatic cancer with possible liver mets, hypertension presented to the ED with complaints of worsening RUQ abdominal pain, with associated distention, nausea, poor p.o. intake for the past 3 days. Pt is post op percutaneous cholecystectomy surgery     PT Comments    Pt progressing, incr gait distance today but continues to demonstrate unsteady gait and limited endurance; pt could benefit from SNF to incr independence and safety prior to return home with husband  Follow Up Recommendations  SNF(vs HHPT pending progress/acute LOS)     Equipment Recommendations  Other (comment)(rollator)    Recommendations for Other Services       Precautions / Restrictions Precautions Precautions: Fall    Mobility  Bed Mobility Overal bed mobility: Needs Assistance Bed Mobility: Supine to Sit     Supine to sit: Min guard;Supervision     General bed mobility comments: pt able to come to long sit with supervision and use of bed rails  Transfers Overall transfer level: Needs assistance Equipment used: None;4-wheeled walker Transfers: Sit to/from Stand Sit to Stand: Min guard;Supervision         General transfer comment: min/guard assist for safety on transition  Ambulation/Gait Ambulation/Gait assistance: Min guard Gait Distance (Feet): 250 Feet(130) Assistive device: None;4-wheeled walker Gait Pattern/deviations: Step-through pattern;Decreased stride length;Trunk flexed     General Gait Details: amb 250' without AD, min to min guard with LOB x2 requiring min assist to recover, reactions delayed; amb 130' with rollator and supervision for safety, cues for maneuvering in smaller  space of room, no LOB   Stairs             Wheelchair Mobility    Modified Rankin (Stroke Patients Only)       Balance     Sitting balance-Leahy Scale: Good     Standing balance support: No upper extremity supported Standing balance-Leahy Scale: Fair Standing balance comment: pt is able to stand statically without UE support, LOB x2 while turning/wt shifting  to put second gown on neeeding min assist to recover, reliant on UE support for dynamic activities             High level balance activites: Backward walking;Direction changes;Turns High Level Balance Comments: requires min assist for above activities            Cognition Arousal/Alertness: Awake/alert Behavior During Therapy: WFL for tasks assessed/performed Overall Cognitive Status: Within Functional Limits for tasks assessed                                        Exercises      General Comments        Pertinent Vitals/Pain Pain Assessment: 0-10 Pain Score: 2  Pain Location: abd Pain Descriptors / Indicators: Sore Pain Intervention(s): Repositioned    Home Living                      Prior Function            PT Goals (current goals can now be found in the care plan section) Acute Rehab PT Goals Patient Stated Goal: Pt would like to increase mobility to complete  ADLs PT Goal Formulation: With patient/family Time For Goal Achievement: 05/25/18 Potential to Achieve Goals: Good Progress towards PT goals: Progressing toward goals    Frequency    Min 3X/week      PT Plan Current plan remains appropriate;Other (comment)    Co-evaluation              AM-PAC PT "6 Clicks" Daily Activity  Outcome Measure  Difficulty turning over in bed (including adjusting bedclothes, sheets and blankets)?: A Little Difficulty moving from lying on back to sitting on the side of the bed? : A Little Difficulty sitting down on and standing up from a chair with arms  (e.g., wheelchair, bedside commode, etc,.)?: A Little Help needed moving to and from a bed to chair (including a wheelchair)?: A Little Help needed walking in hospital room?: A Little Help needed climbing 3-5 steps with a railing? : A Lot 6 Click Score: 17    End of Session Equipment Utilized During Treatment: Gait belt Activity Tolerance: Patient tolerated treatment well Patient left: in chair;with call bell/phone within reach;with family/visitor present   PT Visit Diagnosis: Unsteadiness on feet (R26.81);Other abnormalities of gait and mobility (R26.89);Muscle weakness (generalized) (M62.81)     Time: 2633-3545 PT Time Calculation (min) (ACUTE ONLY): 43 min  Charges:  $Gait Training: 23-37 mins                     Kenyon Ana, [place] Pager: [phone] 05/12/2018   [facility] [phone]    [facility] 05/12/2018, 1:04 PM

## 2018-05-13 ENCOUNTER — Inpatient Hospital Stay (HOSPITAL_COMMUNITY): Payer: Medicare Other

## 2018-05-13 ENCOUNTER — Ambulatory Visit: Payer: Medicare Other

## 2018-05-13 ENCOUNTER — Ambulatory Visit: Payer: Medicare Other | Admitting: Radiation Oncology

## 2018-05-13 LAB — CBC WITH DIFFERENTIAL/PLATELET
BASOS ABS: 0 10*3/uL (ref 0.0–0.1)
Basophils Relative: 0 %
EOS PCT: 1 %
Eosinophils Absolute: 0.2 10*3/uL (ref 0.0–0.7)
HCT: 27.4 % — ABNORMAL LOW (ref 36.0–46.0)
Hemoglobin: 9.6 g/dL — ABNORMAL LOW (ref 12.0–15.0)
Lymphocytes Relative: 5 %
Lymphs Abs: 0.7 10*3/uL (ref 0.7–4.0)
MCH: 29.2 pg (ref 26.0–34.0)
MCHC: 35 g/dL (ref 30.0–36.0)
MCV: 83.3 fL (ref 78.0–100.0)
MONO ABS: 1.3 10*3/uL — AB (ref 0.1–1.0)
MONOS PCT: 10 %
Neutro Abs: 10.8 10*3/uL — ABNORMAL HIGH (ref 1.7–7.7)
Neutrophils Relative %: 84 %
PLATELETS: 400 10*3/uL (ref 150–400)
RBC: 3.29 MIL/uL — ABNORMAL LOW (ref 3.87–5.11)
RDW: 14 % (ref 11.5–15.5)
WBC: 12.9 10*3/uL — ABNORMAL HIGH (ref 4.0–10.5)

## 2018-05-13 LAB — BASIC METABOLIC PANEL
ANION GAP: 11 (ref 5–15)
BUN: 8 mg/dL (ref 8–23)
CALCIUM: 8.1 mg/dL — AB (ref 8.9–10.3)
CO2: 21 mmol/L — ABNORMAL LOW (ref 22–32)
CREATININE: 0.48 mg/dL (ref 0.44–1.00)
Chloride: 106 mmol/L (ref 98–111)
GFR calc Af Amer: >60 mL/min (ref >60)
GLUCOSE: 144 mg/dL — AB (ref 70–99)
Potassium: 3 mmol/L — ABNORMAL LOW (ref 3.5–5.1)
Sodium: 138 mmol/L (ref 135–145)

## 2018-05-13 LAB — AEROBIC/ANAEROBIC CULTURE (SURGICAL/DEEP WOUND): SPECIAL REQUESTS: NORMAL

## 2018-05-13 LAB — T4, FREE: FREE T4: 0.74 ng/dL — AB (ref 0.82–1.77)

## 2018-05-13 LAB — TSH: TSH: 7.152 u[IU]/mL — ABNORMAL HIGH (ref 0.350–4.500)

## 2018-05-13 LAB — MAGNESIUM: MAGNESIUM: 1.7 mg/dL (ref 1.7–2.4)

## 2018-05-13 LAB — AEROBIC/ANAEROBIC CULTURE W GRAM STAIN (SURGICAL/DEEP WOUND)

## 2018-05-13 LAB — ECHOCARDIOGRAM COMPLETE
HEIGHTINCHES: 59.488 in
WEIGHTICAEL: 1260.78 [oz_av]

## 2018-05-13 MED ORDER — AMOXICILLIN-POT CLAVULANATE 500-125 MG PO TABS
500.0000 mg | ORAL_TABLET | Freq: Two times a day (BID) | ORAL | Status: DC
  Administered 2018-05-13 – 2018-05-24 (×22): 500 mg via ORAL
  Filled 2018-05-13 (×24): qty 1

## 2018-05-13 MED ORDER — FUROSEMIDE 20 MG PO TABS
20.0000 mg | ORAL_TABLET | Freq: Once | ORAL | Status: AC
  Administered 2018-05-13: 20 mg via ORAL
  Filled 2018-05-13: qty 1

## 2018-05-13 MED ORDER — POTASSIUM CHLORIDE CRYS ER 20 MEQ PO TBCR
40.0000 meq | EXTENDED_RELEASE_TABLET | Freq: Two times a day (BID) | ORAL | Status: DC
  Administered 2018-05-13 – 2018-05-18 (×11): 40 meq via ORAL
  Filled 2018-05-13 (×11): qty 2

## 2018-05-13 MED ORDER — APIXABAN 2.5 MG PO TABS
2.5000 mg | ORAL_TABLET | Freq: Two times a day (BID) | ORAL | Status: DC
  Administered 2018-05-13 – 2018-05-24 (×22): 2.5 mg via ORAL
  Filled 2018-05-13 (×23): qty 1

## 2018-05-13 MED ORDER — FUROSEMIDE NICU ORAL SYRINGE 10 MG/ML: 20.0000 mg | Freq: Once | ORAL | Status: DC

## 2018-05-13 MED ORDER — METOPROLOL TARTRATE 25 MG PO TABS
25.0000 mg | ORAL_TABLET | Freq: Two times a day (BID) | ORAL | Status: DC
  Administered 2018-05-13 – 2018-05-18 (×10): 25 mg via ORAL
  Filled 2018-05-13 (×10): qty 1

## 2018-05-13 MED ORDER — DILTIAZEM HCL 60 MG PO TABS
60.0000 mg | ORAL_TABLET | Freq: Four times a day (QID) | ORAL | Status: DC
  Administered 2018-05-13 – 2018-05-14 (×3): 60 mg via ORAL
  Filled 2018-05-13 (×3): qty 1

## 2018-05-13 NOTE — Progress Notes (Signed)
PHARMACY NOTE:  ANTIMICROBIAL RENAL DOSAGE ADJUSTMENT  Current antimicrobial regimen includes a mismatch between antimicrobial dosage and estimated renal function. As per policy approved by the Pharmacy & Therapeutics and Medical Executive Committees, the antimicrobial dosage will be adjusted accordingly.  Current antimicrobial and dosage:  Augmentin 875 mg q12 hr  Indication: acute cholecystitis s/p abscess drainage  Renal Function:   Estimated Creatinine Clearance: 28.4 mL/min (by C-G formula based on SCr of 0.48 mg/dL). []      On intermittent HD, scheduled: []      On CRRT    Antimicrobial dosage has been changed to:  500 mg q12 hr   Additional Comments: borderline CrCl, but also only 36 kg   Thank you for allowing pharmacy to be a part of this patient's care.  Reuel Boom, PharmD, BCPS [phone] 05/13/2018, 3:56 PM

## 2018-05-13 NOTE — Progress Notes (Signed)
Referring Physician(s): Byerly,F  Supervising Physician: Aletta Edouard  Patient Status:  [facility] - In-pt  Chief Complaint:  cholecystitis  Subjective:  Pt doing ok; has some RUQ soreness; no N/V; minimal appetite  Allergies: Sulfamethoxazole  Medications: Prior to Admission medications   Medication Sig Start Date End Date Taking? Authorizing Provider  amLODipine (NORVASC) 10 MG tablet Take 10 mg by mouth at bedtime.  06/16/15  Yes [provider]  hydrocortisone (ANUSOL-HC) 2.5 % rectal cream Place 1 application rectally 2 (two) times daily as needed for hemorrhoids or anal itching. 03/22/18  Yes Alla Feeling, NP  ibuprofen (ADVIL,MOTRIN) 100 MG chewable tablet Chew 200 mg by mouth daily as needed for mild pain.   Yes [provider]  levothyroxine (SYNTHROID, LEVOTHROID) 25 MCG tablet Take 25 mcg by mouth daily before breakfast.   Yes [provider]  lipase/protease/amylase (CREON) 12000 units CPEP capsule Take 2 capsules (24,000 Units total) by mouth 3 (three) times daily before meals. Patient taking differently: Take 24,000 Units by mouth 2 (two) times daily.  07/29/17  Yes Truitt Merle, MD  lisinopril (PRINIVIL,ZESTRIL) 5 MG tablet Take 5 mg by mouth at bedtime.    Yes [provider]  LORazepam (ATIVAN) 0.5 MG tablet Take 0.5 tablets (0.25 mg total) by mouth every 6 (six) hours as needed for anxiety (or nausea). 05/02/18  Yes Hayden Pedro, PA-C  Multiple Vitamins-Minerals (MULTIVITAMIN GUMMIES ADULT) CHEW Chew 1 tablet by mouth daily.   Yes [provider]  polyethylene glycol powder (GLYCOLAX/MIRALAX) powder Take 17 g by mouth daily as needed for mild constipation.    Yes [provider]  traMADol (ULTRAM) 50 MG tablet Take 1 tablet (50 mg total) by mouth every 6 (six) hours as needed. Patient taking differently: Take 50 mg by mouth every 6 (six) hours as needed for moderate pain.  08/16/17  Yes Truitt Merle, MD    Cholecalciferol (VITAMIN D3) 400 units CAPS Take 2 capsules by mouth daily.    [provider]     Vital Signs: BP 130/68   Pulse 98   Temp (!) 97.2 F (36.2 C) (Oral)   Resp (!) 22   Ht 4' 11.49" (1.511 m)   Wt 78 lb 12.8 oz (35.7 kg)   SpO2 97%   BMI 15.66 kg/m   Physical Exam  GB drain intact, insertion site ok, mildly tender, output 90 cc yesterday, drain flushes ok  Imaging: Ir Perc Cholecystostomy  Result Date: 05/09/2018 INDICATION: Acute cholecystitis, poor operative candidate History of pancreatic cancer and remote history of percutaneous biliary stent placement, now with acute cholecystitis. Please perform image guided cholecystostomy tube for infection source control purposes. Note, prolonged conversations were held the patient and the patient's family regarding the high likelihood that this cholecystostomy tube will likely be permanent given patient's multiple medical comorbidities. EXAM: ULTRASOUND AND FLUOROSCOPIC-GUIDED CHOLECYSTOSTOMY TUBE PLACEMENT COMPARISON:  CT abdomen pelvis-05/08/2018; right upper quadrant abdominal ultrasound-05/08/2018; percutaneous cholangiogram prior to removal of external biliary drainage catheter - 09/12/2015 MEDICATIONS: The patient is currently admitted to the hospital and on intravenous antibiotics. Antibiotics were administered within an appropriate time frame prior to skin puncture. ANESTHESIA/SEDATION: Moderate (conscious) sedation was employed during this procedure. A total of Versed 1 mg and Fentanyl 50 mcg was administered intravenously. Moderate Sedation Time: 16 minutes. The patient's level of consciousness and vital signs were monitored continuously by radiology nursing throughout the procedure under my direct supervision. CONTRAST:  10 cc Isovue-300 - administered into  the gallbladder fossa. FLUOROSCOPY TIME:  1 minutes 18 seconds (6.3 mGy) COMPLICATIONS: None immediate. PROCEDURE: Informed written consent was obtained from the  patient and the patient's family after a discussion of the risks, benefits and alternatives to treatment. Questions regarding the procedure were encouraged and answered. A timeout was performed prior to the initiation of the procedure. The right upper abdominal quadrant was prepped and draped in the usual sterile fashion, and a sterile drape was applied covering the operative field. Maximum barrier sterile technique with sterile gowns and gloves were used for the procedure. A timeout was performed prior to the initiation of the procedure. Local anesthesia was provided with 1% lidocaine with epinephrine. Ultrasound scanning of the right upper quadrant demonstrates a markedly dilated gallbladder. Of note, the patient reported pain with ultrasound imaging over the gallbladder. Utilizing a transhepatic approach, a 22 gauge needle was advanced into the gallbladder under direct ultrasound guidance. An ultrasound image was saved for documentation purposes. Appropriate intraluminal puncture was confirmed with the efflux of bile and advancement of an 0.018 wire into the gallbladder lumen. The needle was exchanged for an [place] set. A small amount of contrast was injected to confirm appropriate intraluminal positioning. Over a Benson wire, a 54.2-French Cook cholecystomy tube was advanced into the gallbladder fossa, coiled and locked. Bile was aspirated and a small amount of contrast was injected as several post procedural spot radiographic images were obtained in various obliquities. The catheter was secured to the skin with suture, connected to a drainage bag and a dressing was placed. The patient tolerated the procedure well without immediate post procedural complication. IMPRESSION: Successful ultrasound and fluoroscopic guided placement of a 10.2 French cholecystostomy tube. Electronically Signed   By: Sandi Mariscal M.D.   On: 05/09/2018 17:57    Labs:  CBC: Recent Labs    05/10/18 0320 05/11/18 0354  05/12/18 0418 05/13/18 0314  WBC 18.0* 13.0* 12.0* 12.9*  HGB 10.2* 10.0* 9.9* 9.6*  HCT 30.0* 29.2* 28.3* 27.4*  PLT 433* 420* 386 400    COAGS: Recent Labs    05/19/17 0944 05/09/18 1540  INR 0.89 1.61  APTT 28  --     BMP: Recent Labs    05/10/18 0320 05/11/18 0354 05/12/18 0418 05/13/18 0314  NA 133* 134* 132* 138  K 3.8 2.7* 3.3* 3.0*  CL 102 104 103 106  CO2 22 21* 21* 21*  GLUCOSE 192* 119* 123* 144*  BUN 15 14 9 8   CALCIUM 8.1* 8.2* 7.9* 8.1*  CREATININE 0.70 0.48 0.48 0.48  GFRNONAA >60 >60 >60 >60  GFRAA >60 >60 >60 >60    LIVER FUNCTION TESTS: Recent Labs    03/15/18 1409 03/22/18 1614 05/08/18 1420 05/09/18 0420  BILITOT 0.4 0.6 1.4* 1.1  AST 38 39 29 41  ALT 37 38 21 20  ALKPHOS 187* 229* 223* 180*  PROT 7.5 7.4 5.9* 5.3*  ALBUMIN 4.3 4.2 2.8* 2.3*    Assessment and Plan: Pt with hx acute cholecystitis/panc ca; s/p perc cholecystostomy 9/23; afebrile; WBC 12.9(12), hgb 9.6(9.9), creat ok, K 3.0- replace;  bile cx- few klebsiella/abundant viridans strept - antbx per pharmacy; cont drain irrigation- every 8 hrs as IP , once daily as OP; monitor output; dressing change every 1-2 days; f/u cholangiogram/drain exchange in 4-6 weeks (we will call pt with appt time); call [phone] or [phone] with any drain questions   Electronically Signed: D. Rowe Robert, PA-C 05/13/2018, 4:19 PM   I spent a total of 15  minutes at the the patient's bedside AND on the patient's hospital floor or unit, greater than 50% of which was counseling/coordinating care for gallbladder drain    Patient ID: Summer Jones, female   DOB: [birth year], 84 y.o.   MRN: [id number]

## 2018-05-13 NOTE — Progress Notes (Signed)
PROGRESS NOTE  Summer Jones PJK:[id number] DOB: 1929-12-28 DOA: 05/08/2018 PCP: Crist Infante, MD  HPI/Recap of past 24 hours: 88 WF history of gastric outlet obstruction-- gastro-jejunostomy on 11/2017,  history of CBD obstruction status post biliary stents,  pancreatic cancer with possible liver mets,  hypertension  Admit 9/22 RUQ abdominal pain, with associated distention, nausea, poor p.o. intake for the past 3 days.    In the ED, CT abdomen and ultrasound showed possible acute cholecystitis, small amount of ascites, with known pancreatic cancer with mets to the liver.  Patient admitted for further management.  General surgery on consult.   SUBJ Pain is 3-4/10 today-just had liquid tylenol-no fever-eating sparsely--drinking ensure-no cp  Walked and felt "wobbly" and unsteady Scared to eat as worried about vomiting  Assessment/Plan: Principal Problem:   Cholecystitis Active Problems:   Hyponatremia   Essential hypertension   Pancreatic cancer metastasized to liver [facility])   Palliative care by specialist   Pain of metastatic malignancy   Goals of care, counseling/discussion   Advance care planning   Malnutrition of moderate degree   New onset atrial fibrillation chads score >3 on 9/26-converted to NSR 9/27 Appreciate cardiology input-still on drip but converting to Cardizem 60 every 6 Lopressor 25 twice daily Keep on stepdown unit today Probably need OAC in the next 1 to 2 days dependent on cardiology input-unclear if candidate for DCCV  Acute cholecystitis s/p percutaneous cholecystostomy on 05/09/18 by IR Afebrile, wbc 18-->13-12.9 Surgical deep wound showed Klebsiella and viridans streptococci and patient is now on oral Augmentin CT abdomen/pelvis showed persistent gallbladder dilatation?suggesting cholecystitis General surgery consulted, noted to be a poor surgical candidate--s/p perc chole drain 9/23--Needs drain clinic appT?--will forward to IR Continue IV  Zosyn-->Augmentin for 2 weeks per Dr. Barry Dienes who will follow in OP Pain management--no changes CLD, advance as tolerated  Mild hyponatremia Likely 2/2 dehydration and stable in 134 range Continue IVF Daily BMP  Hypokalemia Starting K. Dur 40 twice daily repeat labs with magnesium a.m.  Normocytic anemia 2/2 likely malignancy Hgb stable Anemia panel showed iron 5, sats 3, ferritin 419, folate 14, Vit B12 2,366 S/P 1 dose of feraheme on 05/10/18  Hypothyroidism Continue synthroid  Metastatic pancreatic CA to the liver Planned to start palliative rad on 05/09/18 Hold off for now--- XRT to coordinate probably on 9/30 for further management See IR notes  HTn holding amlodipine and lisnopril for now  Code Status: DNR Family Communication: d/w husband at the bedside who is aware Disposition Plan: Once significant improvement--still in A. fib and still on the unit we will monitor on the unit today and reevaluate   Consultants:  Gen Surg  IR  Procedures: Percutaneous cholecystostomy on 05/09/18 by IR  Antimicrobials:  Zosyn  DVT prophylaxis: Lovenox   Objective: Vitals:   05/13/18 0502 05/13/18 0530 05/13/18 0700 05/13/18 0737  BP: (!) 91/49 (!) 99/42 130/68   Pulse: 86 86 98   Resp: (!) 21 (!) 22 (!) 22   Temp:    (!) 97.5 F (36.4 C)  TempSrc:    Oral  SpO2: 94% 94% 97%   Weight:      Height:        Intake/Output Summary (Last 24 hours) at 05/13/2018 1044 Last data filed at 05/13/2018 0703 Gross per 24 hour  Intake 3961.51 ml  Output 490 ml  Net 3471.51 ml   Filed Weights   05/08/18 1335  Weight: 35.7 kg    Exam:   pleasant in  nad eomi frial and cachectic S1-S2 murmur 4/6 Bi-temporal wasting abd scaphoid-drain in RUQ-moderate drainage clear yellow No le edema Neuro intact coherent mentall--power 5/5  Data Reviewed: CBC: Recent Labs  Lab 05/08/18 1452 05/09/18 0420 05/10/18 0320 05/11/18 0354 05/12/18 0418 05/13/18 0314  WBC 8.1 9.5  18.0* 13.0* 12.0* 12.9*  NEUTROABS 7.4  --  17.0* 12.1* 10.8* 10.8*  HGB 9.9* 9.3* 10.2* 10.0* 9.9* 9.6*  HCT 28.9* 27.7* 30.0* 29.2* 28.3* 27.4*  MCV 86.8 88.2 86.5 84.1 83.2 83.3  PLT 301 332 433* 420* 386 801   Basic Metabolic Panel: Recent Labs  Lab 05/09/18 0420 05/10/18 0320 05/11/18 0354 05/12/18 0418 05/13/18 0314  NA 133* 133* 134* 132* 138  K 3.7 3.8 2.7* 3.3* 3.0*  CL 102 102 104 103 106  CO2 20* 22 21* 21* 21*  GLUCOSE 63* 192* 119* 123* 144*  BUN 12 15 14 9 8   CREATININE 0.63 0.70 0.48 0.48 0.48  CALCIUM 7.8* 8.1* 8.2* 7.9* 8.1*  MG  --   --   --   --  1.7   GFR: Estimated Creatinine Clearance: 28.4 mL/min (by C-G formula based on SCr of 0.48 mg/dL). Liver Function Tests: Recent Labs  Lab 05/08/18 1420 05/09/18 0420  AST 29 41  ALT 21 20  ALKPHOS 223* 180*  BILITOT 1.4* 1.1  PROT 5.9* 5.3*  ALBUMIN 2.8* 2.3*   Recent Labs  Lab 05/08/18 1420  LIPASE 16   No results for input(s): AMMONIA in the last 168 hours. Coagulation Profile: Recent Labs  Lab 05/09/18 1540  INR 1.61   Cardiac Enzymes: No results for input(s): CKTOTAL, CKMB, CKMBINDEX, TROPONINI in the last 168 hours. BNP (last 3 results) No results for input(s): PROBNP in the last 8760 hours. HbA1C: No results for input(s): HGBA1C in the last 72 hours. CBG: No results for input(s): GLUCAP in the last 168 hours. Lipid Profile: No results for input(s): CHOL, HDL, LDLCALC, TRIG, CHOLHDL, LDLDIRECT in the last 72 hours. Thyroid Function Tests: Recent Labs    05/13/18 0314  TSH 7.152*   Anemia Panel: No results for input(s): VITAMINB12, FOLATE, FERRITIN, TIBC, IRON, RETICCTPCT in the last 72 hours. Urine analysis:    Component Value Date/Time   COLORURINE STRAW (A) 05/08/2018 1400   APPEARANCEUR CLEAR 05/08/2018 1400   LABSPEC 1.005 05/08/2018 1400   PHURINE 6.0 05/08/2018 1400   GLUCOSEU NEGATIVE 05/08/2018 1400   HGBUR NEGATIVE 05/08/2018 1400   BILIRUBINUR NEGATIVE 05/08/2018  1400   KETONESUR NEGATIVE 05/08/2018 1400   PROTEINUR NEGATIVE 05/08/2018 1400   NITRITE NEGATIVE 05/08/2018 1400   LEUKOCYTESUR NEGATIVE 05/08/2018 1400   Sepsis Labs: @LABRCNTIP (procalcitonin:4,lacticidven:4)  ) Recent Results (from the past 240 hour(s))  Aerobic/Anaerobic Culture (surgical/deep wound)     Status: None (Preliminary result)   Collection Time: 05/09/18  5:53 PM  Result Value Ref Range Status   Specimen Description WOUND  Final   Special Requests Normal  Final   Gram Stain   Final    MODERATE WBC PRESENT, PREDOMINANTLY PMN ABUNDANT GRAM POSITIVE COCCI ABUNDANT GRAM NEGATIVE RODS ABUNDANT GRAM POSITIVE RODS    Culture   Final    FEW KLEBSIELLA OXYTOCA ABUNDANT VIRIDANS STREPTOCOCCUS CULTURE REINCUBATED FOR BETTER GROWTH NO ANAEROBES ISOLATED; CULTURE IN PROGRESS FOR 5 DAYS    Report Status PENDING  Incomplete   Organism ID, Bacteria KLEBSIELLA OXYTOCA  Final   Organism ID, Bacteria VIRIDANS STREPTOCOCCUS  Final      Susceptibility   Klebsiella oxytoca - MIC*  AMPICILLIN RESISTANT Resistant     CEFAZOLIN <=4 SENSITIVE Sensitive     CEFEPIME <=1 SENSITIVE Sensitive     CEFTAZIDIME <=1 SENSITIVE Sensitive     CEFTRIAXONE <=1 SENSITIVE Sensitive     CIPROFLOXACIN <=0.25 SENSITIVE Sensitive     GENTAMICIN <=1 SENSITIVE Sensitive     IMIPENEM <=0.25 SENSITIVE Sensitive     TRIMETH/SULFA <=20 SENSITIVE Sensitive     AMPICILLIN/SULBACTAM <=2 SENSITIVE Sensitive     PIP/TAZO <=4 SENSITIVE Sensitive     Extended ESBL NEGATIVE Sensitive     * FEW KLEBSIELLA OXYTOCA   Viridans streptococcus - MIC*    ERYTHROMYCIN <=0.12 SENSITIVE Sensitive     LEVOFLOXACIN 0.5 SENSITIVE Sensitive     VANCOMYCIN Value in next row Sensitive      0.5 SENSITIVEPerformed at [facility] [address] [place], [place] [address]    * ABUNDANT VIRIDANS STREPTOCOCCUS  MRSA PCR Screening     Status: None   Collection Time: 05/12/18  7:19 PM  Result Value Ref Range Status    MRSA by PCR NEGATIVE NEGATIVE Final    Comment:        The GeneXpert MRSA Assay (FDA approved for NASAL specimens only), is one component of a comprehensive MRSA colonization surveillance program. It is not intended to diagnose MRSA infection nor to guide or monitor treatment for MRSA infections. Performed at [facility], [place] [address] [place], [place] [address]       Studies: No results found.  Scheduled Meds: . acetaminophen (TYLENOL) oral liquid 160 mg/5 mL  650 mg Oral Q6H  . amoxicillin-clavulanate  1 tablet Oral Q12H  . diltiazem  60 mg Oral Q6H  . docusate sodium  100 mg Oral Daily  . enoxaparin (LOVENOX) injection  30 mg Subcutaneous QHS  . feeding supplement (ENSURE ENLIVE)  237 mL Oral BID BM  . levothyroxine  25 mcg Oral QAC breakfast  . metoprolol tartrate  25 mg Oral BID  . polyethylene glycol  17 g Oral Daily  . potassium chloride  40 mEq Oral BID  . sodium chloride flush  5 mL Intracatheter Q8H    Continuous Infusions: . sodium chloride Stopped (05/09/18 1709)  . sodium chloride 75 mL/hr at 05/13/18 0500     LOS: 5 days     Nita Sells, MD [facility]   If 7PM-7AM, please contact night-coverage [email or web address] 05/13/2018, 10:44 AM

## 2018-05-13 NOTE — Progress Notes (Signed)
Pt. Has bilateral pitting edema in both lower extremeties, complains of shortness of breath. Over 3 L positive in fluid. Paged Dr. Verlon Au. Was ordered to hold fluids and give 20 mg Lasix PO. Held fluids and gave Lasix. Will continue to monitor pt.  Mariann Laster, RN

## 2018-05-13 NOTE — Progress Notes (Addendum)
Ref: Crist Infante, MD   Subjective:  Patient converted to sinus rhythm overnight. Resting comfortably. No chest pain.  Good LV systolic function with moderate MR and TR and moderate to severely dilated LA and mildly dilated RA.  Objective:  Vital Signs in the last 24 hours: Temp:  [96.5 F (35.8 C)-98.5 F (36.9 C)] 97.5 F (36.4 C) (09/27 0737) Pulse Rate:  [41-163] 98 (09/27 0700) Cardiac Rhythm: Normal sinus rhythm (09/27 0701) Resp:  [15-32] 22 (09/27 0700) BP: (91-130)/(42-85) 130/68 (09/27 0700) SpO2:  [93 %-100 %] 97 % (09/27 0700)  Physical Exam: BP Readings from Last 1 Encounters:  05/13/18 130/68     Wt Readings from Last 1 Encounters:  05/08/18 35.7 kg    Weight change:  Body mass index is 15.66 kg/m. HEENT: [place]/AT, Eyes-Blue, PERL, EOMI, Conjunctiva-Pink, Sclera-Non-icteric Neck: No JVD, No bruit, Trachea midline. Lungs:  Clear, Bilateral. Cardiac:  Regular rhythm, normal S1 and S2, no S3. II/VI systolic murmur. Abdomen:  Soft, non-tender. BS present. Extremities:  No edema present. No cyanosis. No clubbing. CNS: AxOx3, Cranial nerves grossly intact, moves all 4 extremities.  Skin: Warm and dry.   Intake/Output from previous day: 09/26 0701 - 09/27 0700 In: 3961.5 [I.V.:3661.5; IV Piggyback:300] Out: 365 [Urine:275; Drains:90]    Lab Results: BMET    Component Value Date/Time   NA 138 05/13/2018 0314   NA 132 (L) 05/12/2018 0418   NA 134 (L) 05/11/2018 0354   NA 137 08/16/2017 1449   NA 135 (L) 06/28/2017 1427   K 3.0 (L) 05/13/2018 0314   K 3.3 (L) 05/12/2018 0418   K 2.7 (LL) 05/11/2018 0354   K 4.0 08/16/2017 1449   K 4.0 06/28/2017 1427   CL 106 05/13/2018 0314   CL 103 05/12/2018 0418   CL 104 05/11/2018 0354   CO2 21 (L) 05/13/2018 0314   CO2 21 (L) 05/12/2018 0418   CO2 21 (L) 05/11/2018 0354   CO2 27 08/16/2017 1449   CO2 25 06/28/2017 1427   GLUCOSE 144 (H) 05/13/2018 0314   GLUCOSE 123 (H) 05/12/2018 0418   GLUCOSE 119 (H)  05/11/2018 0354   GLUCOSE 114 08/16/2017 1449   GLUCOSE 103 06/28/2017 1427   BUN 8 05/13/2018 0314   BUN 9 05/12/2018 0418   BUN 14 05/11/2018 0354   BUN 8.6 08/16/2017 1449   BUN 8.7 06/28/2017 1427   CREATININE 0.48 05/13/2018 0314   CREATININE 0.48 05/12/2018 0418   CREATININE 0.48 05/11/2018 0354   CREATININE 0.71 10/19/2017 1142   CREATININE 0.8 08/16/2017 1449   CREATININE 0.7 06/28/2017 1427   CALCIUM 8.1 (L) 05/13/2018 0314   CALCIUM 7.9 (L) 05/12/2018 0418   CALCIUM 8.2 (L) 05/11/2018 0354   CALCIUM 9.5 08/16/2017 1449   CALCIUM 9.4 06/28/2017 1427   GFRNONAA >60 05/13/2018 0314   GFRNONAA >60 05/12/2018 0418   GFRNONAA >60 05/11/2018 0354   GFRNONAA >60 10/19/2017 1142   GFRAA >60 05/13/2018 0314   GFRAA >60 05/12/2018 0418   GFRAA >60 05/11/2018 0354   GFRAA >60 10/19/2017 1142   CBC    Component Value Date/Time   WBC 12.9 (H) 05/13/2018 0314   RBC 3.29 (L) 05/13/2018 0314   HGB 9.6 (L) 05/13/2018 0314   HGB 12.1 10/19/2017 1142   HGB 12.4 08/16/2017 1449   HCT 27.4 (L) 05/13/2018 0314   HCT 38.1 08/16/2017 1449   PLT 400 05/13/2018 0314   PLT 284 10/19/2017 1142   PLT 258 08/16/2017 1449  MCV 83.3 05/13/2018 0314   MCV 93.3 08/16/2017 1449   MCH 29.2 05/13/2018 0314   MCHC 35.0 05/13/2018 0314   RDW 14.0 05/13/2018 0314   RDW 13.8 08/16/2017 1449   LYMPHSABS 0.7 05/13/2018 0314   LYMPHSABS 0.9 08/16/2017 1449   MONOABS 1.3 (H) 05/13/2018 0314   MONOABS 0.5 08/16/2017 1449   EOSABS 0.2 05/13/2018 0314   EOSABS 0.1 08/16/2017 1449   BASOSABS 0.0 05/13/2018 0314   BASOSABS 0.0 08/16/2017 1449   HEPATIC Function Panel Recent Labs    03/22/18 1614 05/08/18 1420 05/09/18 0420  PROT 7.4 5.9* 5.3*   HEMOGLOBIN A1C No components found for: HGA1C,  MPG CARDIAC ENZYMES No results found for: CKTOTAL, CKMB, CKMBINDEX, TROPONINI BNP No results for input(s): PROBNP in the last 8760 hours. TSH Recent Labs    05/13/18 0314  TSH 7.152*    CHOLESTEROL No results for input(s): CHOL in the last 8760 hours.  Scheduled Meds: . acetaminophen (TYLENOL) oral liquid 160 mg/5 mL  650 mg Oral Q6H  . amoxicillin-clavulanate  1 tablet Oral Q12H  . diltiazem  60 mg Oral Q6H  . docusate sodium  100 mg Oral Daily  . enoxaparin (LOVENOX) injection  30 mg Subcutaneous QHS  . feeding supplement (ENSURE ENLIVE)  237 mL Oral BID BM  . levothyroxine  25 mcg Oral QAC breakfast  . metoprolol tartrate  25 mg Oral BID  . polyethylene glycol  17 g Oral Daily  . potassium chloride  40 mEq Oral BID  . sodium chloride flush  5 mL Intracatheter Q8H   Continuous Infusions: . sodium chloride Stopped (05/09/18 1709)  . sodium chloride 75 mL/hr at 05/13/18 0500   PRN Meds:.sodium chloride, diphenhydrAMINE, ketorolac, LORazepam, morphine injection, ondansetron **OR** ondansetron (ZOFRAN) IV  Assessment/Plan: Paroxysmal atrial fibrillation Moderate MR and TR Dysphagia S/P acute cholecystitis S/P perc cholecystostomy Protein calorie malnutrition Hypothyroidism  Change to oral diltiazem. OK to crush tablet for easier swallowing Small dose B-blocker. Digoxin discontinued. Low dose anticoagulation like Eliquis 2.5 mg. twice daily when OK with surgery.   LOS: 5 days    Dixie Dials  MD  05/13/2018, 9:40 AM

## 2018-05-13 NOTE — Progress Notes (Signed)
  Echocardiogram 2D Echocardiogram has been performed.  Madelaine Etienne 05/13/2018, 8:59 AM

## 2018-05-13 NOTE — Care Management Note (Signed)
Case Management Note  Patient Details  Name: LUCIANNE SMESTAD MRN: [id number] Date of Birth: 01-Aug-1930  Subjective/Objective:                  A. Fib, and iv cardizem drip,iv ns at 75cc/hrs,  Action/Plan: Will follow for progression of care and clinical status. Will follow for case management needs none present at this time.  Expected Discharge Date:  05/13/18               Expected Discharge Plan:  Home/Self Care  In-House Referral:     Discharge planning Services  CM Consult  Post Acute Care Choice:    Choice offered to:     DME Arranged:    DME Agency:     HH Arranged:    HH Agency:     Status of Service:  In process, will continue to follow  If discussed at Long Length of Stay Meetings, dates discussed:    Additional Comments:  Leeroy Cha, RN 05/13/2018, 8:39 AM

## 2018-05-14 LAB — BASIC METABOLIC PANEL
ANION GAP: 7 (ref 5–15)
BUN: 9 mg/dL (ref 8–23)
CO2: 25 mmol/L (ref 22–32)
CREATININE: 0.5 mg/dL (ref 0.44–1.00)
Calcium: 8.5 mg/dL — ABNORMAL LOW (ref 8.9–10.3)
Chloride: 105 mmol/L (ref 98–111)
GFR calc non Af Amer: >60 mL/min (ref >60)
Glucose, Bld: 148 mg/dL — ABNORMAL HIGH (ref 70–99)
POTASSIUM: 3.7 mmol/L (ref 3.5–5.1)
SODIUM: 137 mmol/L (ref 135–145)

## 2018-05-14 LAB — CBC WITH DIFFERENTIAL/PLATELET
BASOS ABS: 0 10*3/uL (ref 0.0–0.1)
BASOS PCT: 0 %
EOS ABS: 0.2 10*3/uL (ref 0.0–0.7)
Eosinophils Relative: 1 %
HCT: 28.6 % — ABNORMAL LOW (ref 36.0–46.0)
Hemoglobin: 10 g/dL — ABNORMAL LOW (ref 12.0–15.0)
Lymphocytes Relative: 5 %
Lymphs Abs: 0.6 10*3/uL — ABNORMAL LOW (ref 0.7–4.0)
MCH: 29.3 pg (ref 26.0–34.0)
MCHC: 35 g/dL (ref 30.0–36.0)
MCV: 83.9 fL (ref 78.0–100.0)
Monocytes Absolute: 1.3 10*3/uL — ABNORMAL HIGH (ref 0.1–1.0)
Monocytes Relative: 10 %
NEUTROS ABS: 10.6 10*3/uL — AB (ref 1.7–7.7)
Neutrophils Relative %: 84 %
Platelets: 406 10*3/uL — ABNORMAL HIGH (ref 150–400)
RBC: 3.41 MIL/uL — AB (ref 3.87–5.11)
RDW: 14.5 % (ref 11.5–15.5)
WBC: 12.7 10*3/uL — AB (ref 4.0–10.5)

## 2018-05-14 LAB — RENAL FUNCTION PANEL
ALBUMIN: 2.4 g/dL — AB (ref 3.5–5.0)
ANION GAP: 7 (ref 5–15)
BUN: 9 mg/dL (ref 8–23)
CALCIUM: 8.5 mg/dL — AB (ref 8.9–10.3)
CO2: 25 mmol/L (ref 22–32)
CREATININE: 0.4 mg/dL — AB (ref 0.44–1.00)
Chloride: 106 mmol/L (ref 98–111)
Glucose, Bld: 149 mg/dL — ABNORMAL HIGH (ref 70–99)
Phosphorus: 2.5 mg/dL (ref 2.5–4.6)
Potassium: 3.8 mmol/L (ref 3.5–5.1)
SODIUM: 138 mmol/L (ref 135–145)

## 2018-05-14 LAB — T3, FREE: T3 FREE: 1.4 pg/mL — AB (ref 2.0–4.4)

## 2018-05-14 LAB — MAGNESIUM: MAGNESIUM: 1.7 mg/dL (ref 1.7–2.4)

## 2018-05-14 MED ORDER — DILTIAZEM HCL 60 MG PO TABS
60.0000 mg | ORAL_TABLET | Freq: Four times a day (QID) | ORAL | Status: DC
  Administered 2018-05-14: 60 mg via ORAL
  Filled 2018-05-14: qty 1

## 2018-05-14 MED ORDER — KETOROLAC TROMETHAMINE 15 MG/ML IJ SOLN: 15.0000 mg | Freq: Four times a day (QID) | INTRAMUSCULAR | Status: AC | PRN

## 2018-05-14 MED ORDER — DILTIAZEM HCL 60 MG PO TABS
120.0000 mg | ORAL_TABLET | Freq: Two times a day (BID) | ORAL | Status: DC
  Administered 2018-05-14 – 2018-05-18 (×8): 120 mg via ORAL
  Filled 2018-05-14 (×8): qty 2

## 2018-05-14 MED ORDER — FUROSEMIDE 20 MG PO TABS
20.0000 mg | ORAL_TABLET | Freq: Once | ORAL | Status: AC
  Administered 2018-05-14: 20 mg via ORAL
  Filled 2018-05-14: qty 1

## 2018-05-14 NOTE — Progress Notes (Signed)
Pt woke up confused and anxious about where she was and why she was here.  She quickly reoriented but was still afraid of waking up disoriented.  Pt reassured and will monitor closely

## 2018-05-14 NOTE — Consult Note (Signed)
Ref: Crist Infante, MD   Subjective:  Remains in SR.VS stable. Increasing leg edema with IV fluids.   Objective:  Vital Signs in the last 24 hours: Temp:  [96.8 F (36 C)-98.1 F (36.7 C)] 98.1 F (36.7 C) (09/28 1200) Pulse Rate:  [56-71] 66 (09/28 0900) Cardiac Rhythm: Normal sinus rhythm (09/28 0900) Resp:  [17-28] 25 (09/28 0900) BP: (76-139)/(41-76) 128/64 (09/28 0939) SpO2:  [92 %-100 %] 98 % (09/28 0900)  Physical Exam: BP Readings from Last 1 Encounters:  05/14/18 128/64     Wt Readings from Last 1 Encounters:  05/08/18 35.7 kg    Weight change:  Body mass index is 15.66 kg/m. HEENT: [place]/AT, Eyes-Blue, PERL, EOMI, Conjunctiva-Pink, Sclera-Non-icteric Neck: No JVD, No bruit, Trachea midline. Lungs:  Clear, Bilateral. Cardiac:  Regular rhythm, normal S1 and S2, no S3. II/VI systolic murmur. Abdomen:  Soft, non-tender. BS present. Extremities:  1 + edema present. No cyanosis. No clubbing. CNS: AxOx3, Cranial nerves grossly intact, moves all 4 extremities.  Skin: Warm and dry.   Intake/Output from previous day: 09/27 0701 - 09/28 0700 In: 395 [P.O.:390] Out: 480 [Urine:475; Drains:5]    Lab Results: BMET    Component Value Date/Time   NA 137 05/14/2018 0320   NA 138 05/14/2018 0320   NA 138 05/13/2018 0314   NA 137 08/16/2017 1449   NA 135 (L) 06/28/2017 1427   K 3.7 05/14/2018 0320   K 3.8 05/14/2018 0320   K 3.0 (L) 05/13/2018 0314   K 4.0 08/16/2017 1449   K 4.0 06/28/2017 1427   CL 105 05/14/2018 0320   CL 106 05/14/2018 0320   CL 106 05/13/2018 0314   CO2 25 05/14/2018 0320   CO2 25 05/14/2018 0320   CO2 21 (L) 05/13/2018 0314   CO2 27 08/16/2017 1449   CO2 25 06/28/2017 1427   GLUCOSE 148 (H) 05/14/2018 0320   GLUCOSE 149 (H) 05/14/2018 0320   GLUCOSE 144 (H) 05/13/2018 0314   GLUCOSE 114 08/16/2017 1449   GLUCOSE 103 06/28/2017 1427   BUN 9 05/14/2018 0320   BUN 9 05/14/2018 0320   BUN 8 05/13/2018 0314   BUN 8.6 08/16/2017 1449   BUN  8.7 06/28/2017 1427   CREATININE 0.50 05/14/2018 0320   CREATININE 0.40 (L) 05/14/2018 0320   CREATININE 0.48 05/13/2018 0314   CREATININE 0.71 10/19/2017 1142   CREATININE 0.8 08/16/2017 1449   CREATININE 0.7 06/28/2017 1427   CALCIUM 8.5 (L) 05/14/2018 0320   CALCIUM 8.5 (L) 05/14/2018 0320   CALCIUM 8.1 (L) 05/13/2018 0314   CALCIUM 9.5 08/16/2017 1449   CALCIUM 9.4 06/28/2017 1427   GFRNONAA >60 05/14/2018 0320   GFRNONAA >60 05/14/2018 0320   GFRNONAA >60 05/13/2018 0314   GFRNONAA >60 10/19/2017 1142   GFRAA >60 05/14/2018 0320   GFRAA >60 05/14/2018 0320   GFRAA >60 05/13/2018 0314   GFRAA >60 10/19/2017 1142   CBC    Component Value Date/Time   WBC 12.7 (H) 05/14/2018 0320   RBC 3.41 (L) 05/14/2018 0320   HGB 10.0 (L) 05/14/2018 0320   HGB 12.1 10/19/2017 1142   HGB 12.4 08/16/2017 1449   HCT 28.6 (L) 05/14/2018 0320   HCT 38.1 08/16/2017 1449   PLT 406 (H) 05/14/2018 0320   PLT 284 10/19/2017 1142   PLT 258 08/16/2017 1449   MCV 83.9 05/14/2018 0320   MCV 93.3 08/16/2017 1449   MCH 29.3 05/14/2018 0320   MCHC 35.0 05/14/2018 0320  RDW 14.5 05/14/2018 0320   RDW 13.8 08/16/2017 1449   LYMPHSABS 0.6 (L) 05/14/2018 0320   LYMPHSABS 0.9 08/16/2017 1449   MONOABS 1.3 (H) 05/14/2018 0320   MONOABS 0.5 08/16/2017 1449   EOSABS 0.2 05/14/2018 0320   EOSABS 0.1 08/16/2017 1449   BASOSABS 0.0 05/14/2018 0320   BASOSABS 0.0 08/16/2017 1449   HEPATIC Function Panel Recent Labs    03/22/18 1614 05/08/18 1420 05/09/18 0420  PROT 7.4 5.9* 5.3*   HEMOGLOBIN A1C No components found for: HGA1C,  MPG CARDIAC ENZYMES No results found for: CKTOTAL, CKMB, CKMBINDEX, TROPONINI BNP No results for input(s): PROBNP in the last 8760 hours. TSH Recent Labs    05/13/18 0314  TSH 7.152*   CHOLESTEROL No results for input(s): CHOL in the last 8760 hours.  Scheduled Meds: . acetaminophen (TYLENOL) oral liquid 160 mg/5 mL  650 mg Oral Q6H  . amoxicillin-clavulanate   500 mg Oral BID  . apixaban  2.5 mg Oral BID  . diltiazem  60 mg Oral QID  . docusate sodium  100 mg Oral Daily  . feeding supplement (ENSURE ENLIVE)  237 mL Oral BID BM  . levothyroxine  25 mcg Oral QAC breakfast  . metoprolol tartrate  25 mg Oral BID  . polyethylene glycol  17 g Oral Daily  . potassium chloride  40 mEq Oral BID  . sodium chloride flush  5 mL Intracatheter Q8H   Continuous Infusions: . sodium chloride Stopped (05/09/18 1709)  . sodium chloride Stopped (05/13/18 1737)   PRN Meds:.sodium chloride, diphenhydrAMINE, ketorolac, LORazepam, morphine injection, ondansetron **OR** ondansetron (ZOFRAN) IV  Assessment/Plan: Paroxysmal atrial fibrillation, now sinus rhtythm Moderate MR and TR Dysphagia S/P acute cholecystitis S/P perc cholecystostomy Protein calie malnutrition Hypothyroidism Leg edema from volume overload  Decrease IV fluid to KVO. Change diltiazem to 60 mg. 2 tablet crushed/PO twice daily for compliance-8 AM and PM. Continue metoprolol twice daily, preferably 4-6 hour after diltiazem dose-Noon and bedtime. One dose lasix PO.   LOS: 6 days    Dixie Dials  MD  05/14/2018, 12:32 PM

## 2018-05-14 NOTE — Progress Notes (Addendum)
Called report to [phone] and gave report to [employer], will transfer pt in a wheelchair on telemetry

## 2018-05-14 NOTE — Progress Notes (Signed)
PROGRESS NOTE    Summer Jones  JJO:[id number] DOB: 1929-10-30 DOA: 05/08/2018 PCP: Crist Infante, MD    Brief Narrative:  69 WF history of gastric outlet obstruction-- gastro-jejunostomy on 11/2017,  history of CBD obstruction status post biliary stents,  pancreatic cancer with possible liver mets,  hypertension  Admit 9/22 RUQ abdominal pain, with associated distention, nausea, poor p.o. intake for the past 3 days.    In the ED, CT abdomen and ultrasound showed possible acute cholecystitis, small amount of ascites, with known pancreatic cancer with mets to the liver.  Patient admitted for further management.  General surgery on consult.   Assessment & Plan:   Principal Problem:   Cholecystitis Active Problems:   Hyponatremia   Essential hypertension   Pancreatic cancer metastasized to liver [facility])   Palliative care by specialist   Pain of metastatic malignancy   Goals of care, counseling/discussion   Advance care planning   Malnutrition of moderate degree  New onset atrial fibrillation chads score >3 on 9/26-converted to NSR 9/27 Appreciate cardiology input -Now on Cardizem 60 every 6 and Lopressor 25 twice daily -currently on eliquis  Acute cholecystitis s/p percutaneous cholecystostomy on 05/09/18 by IR Afebrile, wbc 18-->13-12.9 Surgical deep wound showed Klebsiella and viridans streptococci and patient is now on oral Augmentin CT abdomen/pelvis showed persistent gallbladder dilatation?suggesting cholecystitis General surgery consulted, noted to be a poor surgical candidate--s/p perc chole drain 9/23-follow up with IR Continue IV Zosyn-->Augmentin for 2 weeks per Dr. Barry Dienes who will follow in OP Continue analgesics as needed On regular diet  Mild hyponatremia Resolved with IVF hydration Repeat bmet in AM  Hypokalemia Continue scheduled potassium replacement -Repeat bmet in AM  Normocytic anemia 2/2 likely malignancy Hgb stable Anemia panel showed iron 5,  sats 3, ferritin 419, folate 14, Vit B12 2,366 S/P 1 dose of feraheme on 05/10/18 -Repeat cbc in AM  Hypothyroidism Continue synthroid as tolerated Medically stable at this time  Metastatic pancreatic CA to the liver Planned to start palliative rad on 05/09/18 Oncology folloiwng  HTn held amlodipine and lisnopril at present secondary to soft BP to allow for cardizem -Continue on cardizem and metoprolol  DVT prophylaxis: eliquis Code Status: DNR Family Communication: Pt in room, family at bedside Disposition Plan: SNF, timing uncertain   Consultants:   General Surgery  IR  Procedures:  Percutaneous cholecystostomy on 05/09/18 by IR  Antimicrobials: Anti-infectives (From admission, onward)   Start     Dose/Rate Route Frequency Ordered Stop   05/13/18 2200  amoxicillin-clavulanate (AUGMENTIN) 500-125 MG per tablet 500 mg     500 mg Oral 2 times daily 05/13/18 1556     05/12/18 1415  amoxicillin-clavulanate (AUGMENTIN) 875-125 MG per tablet 1 tablet  Status:  Discontinued     1 tablet Oral Every 12 hours 05/12/18 1402 05/13/18 1556   05/09/18 0200  piperacillin-tazobactam (ZOSYN) IVPB 3.375 g  Status:  Discontinued     3.375 g 12.5 mL/hr over 240 Minutes Intravenous Every 8 hours 05/08/18 1915 05/12/18 1402   05/08/18 1845  piperacillin-tazobactam (ZOSYN) IVPB 3.375 g     3.375 g 100 mL/hr over 30 Minutes Intravenous  Once 05/08/18 1833 05/08/18 1908       Subjective: Without complaints at this time  Objective: Vitals:   05/14/18 0900 05/14/18 0939 05/14/18 1200 05/14/18 1236  BP: (!) 76/60 128/64  (!) 114/58  Pulse: 66     Resp: (!) 25   (!) 30  Temp:   98.1 F (  36.7 C)   TempSrc:   Oral   SpO2: 98%   96%  Weight:      Height:        Intake/Output Summary (Last 24 hours) at 05/14/2018 1424 Last data filed at 05/14/2018 1303 Gross per 24 hour  Intake 1699.49 ml  Output 300 ml  Net 1399.49 ml   Filed Weights   05/08/18 1335  Weight: 35.7 kg     Examination:  General exam: Appears calm and comfortable  Respiratory system: Clear to auscultation. Respiratory effort normal. Cardiovascular system: S1 & S2 heard, irregularly irregular Gastrointestinal system: Abdomen is nondistended, soft and nontender. No organomegaly or masses felt. Normal bowel sounds heard. Central nervous system: Alert and oriented. No focal neurological deficits. Extremities: Symmetric 5 x 5 power. Skin: No rashes, lesions Psychiatry: Judgement and insight appear normal. Mood & affect appropriate.   Data Reviewed: I have personally reviewed following labs and imaging studies  CBC: Recent Labs  Lab 05/10/18 0320 05/11/18 0354 05/12/18 0418 05/13/18 0314 05/14/18 0320  WBC 18.0* 13.0* 12.0* 12.9* 12.7*  NEUTROABS 17.0* 12.1* 10.8* 10.8* 10.6*  HGB 10.2* 10.0* 9.9* 9.6* 10.0*  HCT 30.0* 29.2* 28.3* 27.4* 28.6*  MCV 86.5 84.1 83.2 83.3 83.9  PLT 433* 420* 386 400 629*   Basic Metabolic Panel: Recent Labs  Lab 05/10/18 0320 05/11/18 0354 05/12/18 0418 05/13/18 0314 05/14/18 0320  NA 133* 134* 132* 138 138  137  K 3.8 2.7* 3.3* 3.0* 3.8  3.7  CL 102 104 103 106 106  105  CO2 22 21* 21* 21* 25  25  GLUCOSE 192* 119* 123* 144* 149*  148*  BUN 15 14 9 8 9  9   CREATININE 0.70 0.48 0.48 0.48 0.40*  0.50  CALCIUM 8.1* 8.2* 7.9* 8.1* 8.5*  8.5*  MG  --   --   --  1.7 1.7  PHOS  --   --   --   --  2.5   GFR: Estimated Creatinine Clearance: 28.4 mL/min (by C-G formula based on SCr of 0.5 mg/dL). Liver Function Tests: Recent Labs  Lab 05/08/18 1420 05/09/18 0420 05/14/18 0320  AST 29 41  --   ALT 21 20  --   ALKPHOS 223* 180*  --   BILITOT 1.4* 1.1  --   PROT 5.9* 5.3*  --   ALBUMIN 2.8* 2.3* 2.4*   Recent Labs  Lab 05/08/18 1420  LIPASE 16   No results for input(s): AMMONIA in the last 168 hours. Coagulation Profile: Recent Labs  Lab 05/09/18 1540  INR 1.61   Cardiac Enzymes: No results for input(s): CKTOTAL, CKMB,  CKMBINDEX, TROPONINI in the last 168 hours. BNP (last 3 results) No results for input(s): PROBNP in the last 8760 hours. HbA1C: No results for input(s): HGBA1C in the last 72 hours. CBG: No results for input(s): GLUCAP in the last 168 hours. Lipid Profile: No results for input(s): CHOL, HDL, LDLCALC, TRIG, CHOLHDL, LDLDIRECT in the last 72 hours. Thyroid Function Tests: Recent Labs    05/13/18 0314 05/13/18 0809  TSH 7.152*  --   FREET4  --  0.74*  T3FREE  --  1.4*   Anemia Panel: No results for input(s): VITAMINB12, FOLATE, FERRITIN, TIBC, IRON, RETICCTPCT in the last 72 hours. Sepsis Labs: Recent Labs  Lab 05/08/18 1427  LATICACIDVEN 1.18    Recent Results (from the past 240 hour(s))  Aerobic/Anaerobic Culture (surgical/deep wound)     Status: None   Collection Time: 05/09/18  5:53 PM  Result Value Ref Range Status   Specimen Description   Final    WOUND Performed at [facility] [address] [place], [place] [address]    Special Requests   Final    Normal Performed at [facility], [place] [address], [place], [place] [address]    Gram Stain   Final    MODERATE WBC PRESENT, PREDOMINANTLY PMN ABUNDANT GRAM POSITIVE COCCI ABUNDANT GRAM NEGATIVE RODS ABUNDANT GRAM POSITIVE RODS Performed at [facility], [place] [address], [place], [place] [address]    Culture   Final    FEW KLEBSIELLA OXYTOCA ABUNDANT VIRIDANS STREPTOCOCCUS CULTURE REINCUBATED FOR BETTER GROWTH MIXED ANAEROBIC FLORA PRESENT.  CALL LAB IF FURTHER IID REQUIRED.    Report Status 05/13/2018 FINAL  Final   Organism ID, Bacteria KLEBSIELLA OXYTOCA  Final   Organism ID, Bacteria VIRIDANS STREPTOCOCCUS  Final      Susceptibility   Klebsiella oxytoca - MIC*    AMPICILLIN RESISTANT Resistant     CEFAZOLIN <=4 SENSITIVE Sensitive     CEFEPIME <=1 SENSITIVE Sensitive     CEFTAZIDIME <=1 SENSITIVE Sensitive     CEFTRIAXONE <=1 SENSITIVE Sensitive      CIPROFLOXACIN <=0.25 SENSITIVE Sensitive     GENTAMICIN <=1 SENSITIVE Sensitive     IMIPENEM <=0.25 SENSITIVE Sensitive     TRIMETH/SULFA <=20 SENSITIVE Sensitive     AMPICILLIN/SULBACTAM <=2 SENSITIVE Sensitive     PIP/TAZO <=4 SENSITIVE Sensitive     Extended ESBL NEGATIVE Sensitive     * FEW KLEBSIELLA OXYTOCA   Viridans streptococcus - MIC*    ERYTHROMYCIN <=0.12 SENSITIVE Sensitive     LEVOFLOXACIN 0.5 SENSITIVE Sensitive     VANCOMYCIN 0.5 SENSITIVE Sensitive     * ABUNDANT VIRIDANS STREPTOCOCCUS  MRSA PCR Screening     Status: None   Collection Time: 05/12/18  7:19 PM  Result Value Ref Range Status   MRSA by PCR NEGATIVE NEGATIVE Final    Comment:        The GeneXpert MRSA Assay (FDA approved for NASAL specimens only), is one component of a comprehensive MRSA colonization surveillance program. It is not intended to diagnose MRSA infection nor to guide or monitor treatment for MRSA infections. Performed at [facility], [place] [address], [place], [place] [address]      Radiology Studies: No results found.  Scheduled Meds: . acetaminophen (TYLENOL) oral liquid 160 mg/5 mL  650 mg Oral Q6H  . amoxicillin-clavulanate  500 mg Oral BID  . apixaban  2.5 mg Oral BID  . diltiazem  120 mg Oral Q12H  . docusate sodium  100 mg Oral Daily  . feeding supplement (ENSURE ENLIVE)  237 mL Oral BID BM  . furosemide  20 mg Oral Once  . levothyroxine  25 mcg Oral QAC breakfast  . metoprolol tartrate  25 mg Oral BID  . polyethylene glycol  17 g Oral Daily  . potassium chloride  40 mEq Oral BID  . sodium chloride flush  5 mL Intracatheter Q8H   Continuous Infusions: . sodium chloride Stopped (05/09/18 1709)  . sodium chloride Stopped (05/14/18 1303)     LOS: 6 days   Marylu Lund, MD [facility] Pager On Amion  If 7PM-7AM, please contact night-coverage 05/14/2018, 2:24 PM

## 2018-05-14 NOTE — Discharge Instructions (Signed)
Cholecystitis °Cholecystitis is inflammation of the gallbladder. It is often called a gallbladder attack. The gallbladder is a pear-shaped organ that lies beneath the liver on the right side of the body. The gallbladder stores bile, which is a fluid that helps the body to digest fats. If bile builds up in your gallbladder, your gallbladder becomes inflamed. This condition may occur suddenly (be acute). Repeat episodes of acute cholecystitis or prolonged episodes may lead to a long-term (chronic) condition. Cholecystitis is serious and it requires treatment. °What are the causes? °The most common cause of this condition is gallstones. Gallstones can block the tube (duct) that carries bile out of your gallbladder. This causes bile to build up. Other causes of this condition include: °· Damage to the gallbladder due to a decrease in blood flow. °· Infections in the bile ducts. °· Scars or kinks in the bile ducts. °· Tumors in the liver, pancreas, or gallbladder. °What increases the risk? °This condition is more likely to develop in: °· People who have sickle cell disease. °· People who take birth control pills or use estrogen. °· People who have alcoholic liver disease. °· People who have liver cirrhosis. °· People who have their nutrition delivered through a vein (parenteral nutrition). °· People who do not eat or drink (do fasting) for a long period of time. °· People who are obese. °· People who have rapid weight loss. °· People who are pregnant. °· People who have increased triglyceride levels. °· People who have pancreatitis. °What are the signs or symptoms? °Symptoms of this condition include: °· Abdominal pain, especially in the upper right area of the abdomen. °· Abdominal tenderness or bloating. °· Nausea. °· Vomiting. °· Fever. °· Chills. °· Yellowing of the skin and the whites of the eyes (jaundice). °How is this diagnosed? °This condition is diagnosed with a medical history and physical exam. You may also  have other tests, including: °· Imaging tests, such as: °¨ An ultrasound of the gallbladder. °¨ A CT scan of the abdomen. °¨ A gallbladder nuclear scan (HIDA scan). This scan allows your health care provider to see the bile moving from your liver to your gallbladder and to your small intestine. °¨ MRI. °· Blood tests, such as: °¨ A complete blood count, because the white blood cell count may be higher than normal. °¨ Liver function tests, because some levels may be higher than normal with certain types of gallstones. °How is this treated? °Treatment may include: °· Fasting for a certain amount of time. °· IV fluids. °· Medicine to treat pain or vomiting. °· Antibiotic medicine. °· Surgery to remove your gallbladder (cholecystectomy). This may happen immediately or at a later time. °Follow these instructions at home: °Home care will depend on your treatment. In general: °· Take over-the-counter and prescription medicines only as told by your health care provider. °· If you were prescribed an antibiotic medicine, take it as told by your health care provider. Do not stop taking the antibiotic even if you start to feel better. °· Follow instructions from your health care provider about what to eat or drink. When you are allowed to eat, avoid eating or drinking anything that triggers your symptoms. °· Keep all follow-up visits as told by your health care provider. This is important. °Contact a health care provider if: °· Your pain is not controlled with medicine. °· You have a fever. °Get help right away if: °· Your pain moves to another part of your abdomen or to your   of your abdomen or to your back.  You continue to have symptoms or you develop new symptoms even with treatment. This information is not intended to replace advice given to you by your health care provider. Make sure you discuss any questions you have with your health care provider. Document Released: 08/03/2005 Document Revised: 12/12/2015 Document Reviewed:  11/14/2014 Elsevier Interactive Patient Education  2018 [place] on my medicine - ELIQUIS (apixaban)  This medication education was reviewed with me or my healthcare representative as part of my discharge preparation.  The pharmacist that spoke with me during my hospital stay was:  Minda Ditto, [facility]  Why was Eliquis prescribed for you? Eliquis was prescribed for you to reduce the risk of a blood clot forming that can cause a stroke if you have a medical condition called atrial fibrillation (a type of irregular heartbeat).  What do You need to know about Eliquis ? Take your Eliquis TWICE DAILY - one tablet in the morning and one tablet in the evening with or without food. If you have difficulty swallowing the tablet whole please discuss with your pharmacist how to take the medication safely.  Take Eliquis exactly as prescribed by your doctor and DO NOT stop taking Eliquis without talking to the doctor who prescribed the medication.  Stopping may increase your risk of developing a stroke.  Refill your prescription before you run out.  After discharge, you should have regular check-up appointments with your healthcare provider that is prescribing your Eliquis.  In the future your dose may need to be changed if your kidney function or weight changes by a significant amount or as you get older.  What do you do if you miss a dose? If you miss a dose, take it as soon as you remember on the same day and resume taking twice daily.  Do not take more than one dose of ELIQUIS at the same time to make up a missed dose.  Important Safety Information A possible side effect of Eliquis is bleeding. You should call your healthcare provider right away if you experience any of the following: ? Bleeding from an injury or your nose that does not stop. ? Unusual colored urine (red or dark brown) or unusual colored stools (red or black). ? Unusual bruising for unknown reasons. ? A  serious fall or if you hit your head (even if there is no bleeding).  Some medicines may interact with Eliquis and might increase your risk of bleeding or clotting while on Eliquis. To help avoid this, consult your healthcare provider or pharmacist prior to using any new prescription or non-prescription medications, including herbals, vitamins, non-steroidal anti-inflammatory drugs (NSAIDs) and supplements.  This website has more information on Eliquis (apixaban): http://www.eliquis.com/eliquis/home

## 2018-05-14 NOTE — Progress Notes (Signed)
Pt refused to take her midnight medicines.  RN explained to patient importance of it.  Pt verbalized understanding but would like to take it later.  Will monitor closely.

## 2018-05-15 DIAGNOSIS — R140 Abdominal distension (gaseous): Secondary | ICD-10-CM

## 2018-05-15 LAB — CBC WITH DIFFERENTIAL/PLATELET
BASOS ABS: 0 10*3/uL (ref 0.0–0.1)
Basophils Relative: 0 %
Eosinophils Absolute: 0.2 10*3/uL (ref 0.0–0.7)
Eosinophils Relative: 1 %
HEMATOCRIT: 31.2 % — AB (ref 36.0–46.0)
Hemoglobin: 10.5 g/dL — ABNORMAL LOW (ref 12.0–15.0)
LYMPHS PCT: 8 %
Lymphs Abs: 1.1 10*3/uL (ref 0.7–4.0)
MCH: 28.8 pg (ref 26.0–34.0)
MCHC: 33.7 g/dL (ref 30.0–36.0)
MCV: 85.7 fL (ref 78.0–100.0)
MONO ABS: 1.5 10*3/uL — AB (ref 0.1–1.0)
MONOS PCT: 11 %
NEUTROS ABS: 10.7 10*3/uL — AB (ref 1.7–7.7)
Neutrophils Relative %: 80 %
Platelets: 507 10*3/uL — ABNORMAL HIGH (ref 150–400)
RBC: 3.64 MIL/uL — ABNORMAL LOW (ref 3.87–5.11)
RDW: 14.9 % (ref 11.5–15.5)
WBC: 13.5 10*3/uL — ABNORMAL HIGH (ref 4.0–10.5)

## 2018-05-15 MED ORDER — HYDROCORTISONE ACETATE 25 MG RE SUPP
25.0000 mg | Freq: Two times a day (BID) | RECTAL | Status: DC | PRN
  Administered 2018-05-21 – 2018-05-23 (×2): 25 mg via RECTAL
  Filled 2018-05-15 (×3): qty 1

## 2018-05-15 MED ORDER — ZOLPIDEM TARTRATE 5 MG PO TABS
5.0000 mg | ORAL_TABLET | Freq: Every evening | ORAL | Status: DC | PRN
  Administered 2018-05-17: 5 mg via ORAL
  Filled 2018-05-15: qty 1

## 2018-05-15 NOTE — Consult Note (Signed)
Ref: Crist Infante, MD   Subjective:  VS stable. SR continues. Now on diltiazem 120 mg. twice daily. And metoprolol 25 mg. one twice daily. Now on Apixaban 2.5 mg. twice daily.  Objective:  Vital Signs in the last 24 hours: Temp:  [97.7 F (36.5 C)-98.1 F (36.7 C)] 97.7 F (36.5 C) (09/29 0534) Pulse Rate:  [82-92] 89 (09/29 0534) Cardiac Rhythm: Normal sinus rhythm (09/29 0700) Resp:  [16-30] 16 (09/29 0534) BP: (111-129)/(58-68) 120/62 (09/29 0534) SpO2:  [96 %-99 %] 99 % (09/29 0534)  Physical Exam: BP Readings from Last 1 Encounters:  05/15/18 120/62     Wt Readings from Last 1 Encounters:  05/08/18 35.7 kg    Weight change:  Body mass index is 15.66 kg/m. HEENT: [place]/AT, Eyes-Blue, PERL, EOMI, Conjunctiva-Pink, Sclera-Non-icteric Neck: No JVD, No bruit, Trachea midline. Lungs:  Clear, Bilateral. Cardiac:  Regular rhythm, normal S1 and S2, no S3. II/VI systolic murmur. Abdomen:  Soft, non-tender. BS present. Drainage tube in RUQ to wall suction.  Extremities:  1 + edema present. No cyanosis. No clubbing. CNS: AxOx3, Cranial nerves grossly intact, moves all 4 extremities.  Skin: Warm and dry.   Intake/Output from previous day: 09/28 0701 - 09/29 0700 In: 1334.9 [P.O.:360; I.V.:964.9] Out: 45 [Drains:45]    Lab Results: BMET    Component Value Date/Time   NA 137 05/14/2018 0320   NA 138 05/14/2018 0320   NA 138 05/13/2018 0314   NA 137 08/16/2017 1449   NA 135 (L) 06/28/2017 1427   K 3.7 05/14/2018 0320   K 3.8 05/14/2018 0320   K 3.0 (L) 05/13/2018 0314   K 4.0 08/16/2017 1449   K 4.0 06/28/2017 1427   CL 105 05/14/2018 0320   CL 106 05/14/2018 0320   CL 106 05/13/2018 0314   CO2 25 05/14/2018 0320   CO2 25 05/14/2018 0320   CO2 21 (L) 05/13/2018 0314   CO2 27 08/16/2017 1449   CO2 25 06/28/2017 1427   GLUCOSE 148 (H) 05/14/2018 0320   GLUCOSE 149 (H) 05/14/2018 0320   GLUCOSE 144 (H) 05/13/2018 0314   GLUCOSE 114 08/16/2017 1449   GLUCOSE 103  06/28/2017 1427   BUN 9 05/14/2018 0320   BUN 9 05/14/2018 0320   BUN 8 05/13/2018 0314   BUN 8.6 08/16/2017 1449   BUN 8.7 06/28/2017 1427   CREATININE 0.50 05/14/2018 0320   CREATININE 0.40 (L) 05/14/2018 0320   CREATININE 0.48 05/13/2018 0314   CREATININE 0.71 10/19/2017 1142   CREATININE 0.8 08/16/2017 1449   CREATININE 0.7 06/28/2017 1427   CALCIUM 8.5 (L) 05/14/2018 0320   CALCIUM 8.5 (L) 05/14/2018 0320   CALCIUM 8.1 (L) 05/13/2018 0314   CALCIUM 9.5 08/16/2017 1449   CALCIUM 9.4 06/28/2017 1427   GFRNONAA >60 05/14/2018 0320   GFRNONAA >60 05/14/2018 0320   GFRNONAA >60 05/13/2018 0314   GFRNONAA >60 10/19/2017 1142   GFRAA >60 05/14/2018 0320   GFRAA >60 05/14/2018 0320   GFRAA >60 05/13/2018 0314   GFRAA >60 10/19/2017 1142   CBC    Component Value Date/Time   WBC 13.5 (H) 05/15/2018 0603   RBC 3.64 (L) 05/15/2018 0603   HGB 10.5 (L) 05/15/2018 0603   HGB 12.1 10/19/2017 1142   HGB 12.4 08/16/2017 1449   HCT 31.2 (L) 05/15/2018 0603   HCT 38.1 08/16/2017 1449   PLT 507 (H) 05/15/2018 0603   PLT 284 10/19/2017 1142   PLT 258 08/16/2017 1449   MCV  85.7 05/15/2018 0603   MCV 93.3 08/16/2017 1449   MCH 28.8 05/15/2018 0603   MCHC 33.7 05/15/2018 0603   RDW 14.9 05/15/2018 0603   RDW 13.8 08/16/2017 1449   LYMPHSABS 1.1 05/15/2018 0603   LYMPHSABS 0.9 08/16/2017 1449   MONOABS 1.5 (H) 05/15/2018 0603   MONOABS 0.5 08/16/2017 1449   EOSABS 0.2 05/15/2018 0603   EOSABS 0.1 08/16/2017 1449   BASOSABS 0.0 05/15/2018 0603   BASOSABS 0.0 08/16/2017 1449   HEPATIC Function Panel Recent Labs    03/22/18 1614 05/08/18 1420 05/09/18 0420  PROT 7.4 5.9* 5.3*   HEMOGLOBIN A1C No components found for: HGA1C,  MPG CARDIAC ENZYMES No results found for: CKTOTAL, CKMB, CKMBINDEX, TROPONINI BNP No results for input(s): PROBNP in the last 8760 hours. TSH Recent Labs    05/13/18 0314  TSH 7.152*   CHOLESTEROL No results for input(s): CHOL in the last 8760  hours.  Scheduled Meds: . acetaminophen (TYLENOL) oral liquid 160 mg/5 mL  650 mg Oral Q6H  . amoxicillin-clavulanate  500 mg Oral BID  . apixaban  2.5 mg Oral BID  . diltiazem  120 mg Oral Q12H  . docusate sodium  100 mg Oral Daily  . feeding supplement (ENSURE ENLIVE)  237 mL Oral BID BM  . levothyroxine  25 mcg Oral QAC breakfast  . metoprolol tartrate  25 mg Oral BID  . polyethylene glycol  17 g Oral Daily  . potassium chloride  40 mEq Oral BID  . sodium chloride flush  5 mL Intracatheter Q8H   Continuous Infusions: . sodium chloride Stopped (05/09/18 1709)  . sodium chloride 10 mL/hr at 05/14/18 1441   PRN Meds:.sodium chloride, diphenhydrAMINE, hydrocortisone, ketorolac, LORazepam, morphine injection, ondansetron **OR** ondansetron (ZOFRAN) IV, zolpidem  Assessment/Plan: Paroxysmal atrial fibrillation to NSR. Moderate MR and TR Dysphagia S/P acute cholecystitis S/P per cholecystostomy Protein calorie malnutrition, severe Hypothyroidism  Continue medical treatment. Awaiting rehab placement. F/U as needed.   LOS: 7 days    Dixie Dials  MD  05/15/2018, 11:07 AM

## 2018-05-15 NOTE — Progress Notes (Signed)
Physical Therapy Treatment Patient Details Name: Summer Jones MRN: [id number] DOB: 03/22/1930 Today's Date: 05/15/2018    History of Present Illness 84 year old female with history of gastric outlet obstruction status post gastro-jejunostomy on 11/2017, history of CBD obstruction status post biliary stents, pancreatic cancer with possible liver mets, hypertension presented to the ED with complaints of worsening RUQ abdominal pain, with associated distention, nausea, poor p.o. intake for the past 3 days. Pt is post op percutaneous cholecystectomy surgery     PT Comments    Pt requiring more assistance with bed mobility today.  She c/o some dyspnea with gait.  O2 94% on room air. Recommend SNF due to overall deconditioning.   Follow Up Recommendations  SNF     Equipment Recommendations  Other (comment)(rollator)    Recommendations for Other Services       Precautions / Restrictions Precautions Precautions: Fall Restrictions Weight Bearing Restrictions: No    Mobility  Bed Mobility Overal bed mobility: Needs Assistance Bed Mobility: Supine to Sit     Supine to sit: Min assist     General bed mobility comments: MIN A for trunk and to A with R LE  Transfers Overall transfer level: Needs assistance Equipment used: Rolling walker (2 wheeled) Transfers: Sit to/from Stand Sit to Stand: Min assist         General transfer comment: MIN A to power up  Ambulation/Gait Ambulation/Gait assistance: Min guard Gait Distance (Feet): 125 Feet(plus 75') Assistive device: Rolling walker (2 wheeled) Gait Pattern/deviations: Step-through pattern;Decreased stride length;Trunk flexed Gait velocity: decreased   General Gait Details: Pt required 1 standing rest break with gait and cues for upright posture.  Pt c/o SOB o2 94% during gait and 96% in sitting once done.   Stairs             Wheelchair Mobility    Modified Rankin (Stroke Patients Only)       Balance Overall  balance assessment: Needs assistance Sitting-balance support: Bilateral upper extremity supported Sitting balance-Leahy Scale: Good     Standing balance support: Bilateral upper extremity supported Standing balance-Leahy Scale: Fair                              Cognition Arousal/Alertness: Awake/alert Behavior During Therapy: WFL for tasks assessed/performed Overall Cognitive Status: Within Functional Limits for tasks assessed                                        Exercises      General Comments        Pertinent Vitals/Pain Pain Assessment: No/denies pain    Home Living                      Prior Function            PT Goals (current goals can now be found in the care plan section) Acute Rehab PT Goals PT Goal Formulation: With patient/family Time For Goal Achievement: 05/25/18 Potential to Achieve Goals: Good Progress towards PT goals: Progressing toward goals    Frequency    Min 3X/week      PT Plan Current plan remains appropriate    Co-evaluation              AM-PAC PT "6 Clicks" Daily Activity  Outcome Measure  Difficulty turning over in bed (  including adjusting bedclothes, sheets and blankets)?: A Little Difficulty moving from lying on back to sitting on the side of the bed? : Unable Difficulty sitting down on and standing up from a chair with arms (e.g., wheelchair, bedside commode, etc,.)?: A Little Help needed moving to and from a bed to chair (including a wheelchair)?: A Little Help needed walking in hospital room?: A Little Help needed climbing 3-5 steps with a railing? : A Lot 6 Click Score: 15    End of Session   Activity Tolerance: Patient tolerated treatment well Patient left: in chair;with call bell/phone within reach;with chair alarm set;with nursing/sitter in room;with family/visitor present Nurse Communication: Mobility status;Other (comment)(c/o SOB and o2 sats) PT Visit Diagnosis:  Unsteadiness on feet (R26.81);Other abnormalities of gait and mobility (R26.89);Muscle weakness (generalized) (M62.81)     Time: 8127-5170 PT Time Calculation (min) (ACUTE ONLY): 32 min  Charges:  $Gait Training: 8-22 mins $Therapeutic Activity: 8-22 mins                    Tashara Suder L. Tamala Julian, [place] Pager [phone] 05/15/2018    Galen Manila 05/15/2018, 3:07 PM

## 2018-05-15 NOTE — Progress Notes (Addendum)
PROGRESS NOTE    Summer Jones  ZTI:[id number] DOB: [birth year] DOA: 05/08/2018 PCP: Crist Infante, MD    Brief Narrative:  70 WF history of gastric outlet obstruction-- gastro-jejunostomy on 11/2017,  history of CBD obstruction status post biliary stents,  pancreatic cancer with possible liver mets,  hypertension  Admit 9/22 RUQ abdominal pain, with associated distention, nausea, poor p.o. intake for the past 3 days.    In the ED, CT abdomen and ultrasound showed possible acute cholecystitis, small amount of ascites, with known pancreatic cancer with mets to the liver.  Patient admitted for further management.  General surgery on consult.   Assessment & Plan:   Principal Problem:   Cholecystitis Active Problems:   Hyponatremia   Essential hypertension   Pancreatic cancer metastasized to liver [facility])   Palliative care by specialist   Pain of metastatic malignancy   Goals of care, counseling/discussion   Advance care planning   Malnutrition of moderate degree  New onset atrial fibrillation chads score >3 on 9/26-converted to NSR 9/27 Appreciate cardiology input -Now on Cardizem 60 every 6 and Lopressor 25 twice daily -currently on eliquis, tolerating. No signs of acute blood loss  Acute cholecystitis s/p percutaneous cholecystostomy on 05/09/18 by IR Afebrile, wbc 18-->13-12.9 Surgical deep wound showed Klebsiella and viridans streptococci and patient is now on oral Augmentin CT abdomen/pelvis showed persistent gallbladder dilatation?suggesting cholecystitis General surgery consulted, noted to be a poor surgical candidate--s/p perc chole drain 9/23-follow up with IR Continue IV Zosyn-->Augmentin for 2 weeks per Dr. Barry Dienes who will follow in OP Continue analgesics as needed On regular diet  Mild hyponatremia Resolved with IVF hydration Repeat bmet in AM  Hypokalemia Continue scheduled potassium replacement -Repeat bmet in AM  Normocytic anemia 2/2 likely  malignancy Hgb stable Anemia panel showed iron 5, sats 3, ferritin 419, folate 14, Vit B12 2,366 S/P 1 dose of feraheme on 05/10/18 -Repeat cbc in AM  Hypothyroidism Continue synthroid as tolerated Medically stable at this time  Metastatic pancreatic CA to the liver Planned to start palliative rad on 05/09/18 Oncology folloiwng  HTn held amlodipine and lisnopril at present secondary to soft BP to allow for cardizem -Continue on cardizem and metoprolol  DVT prophylaxis: eliquis Code Status: DNR Family Communication: Pt in room, family at bedside Disposition Plan: SNF, timing uncertain   Consultants:   General Surgery  IR  Procedures:  Percutaneous cholecystostomy on 05/09/18 by IR  Antimicrobials: Anti-infectives (From admission, onward)   Start     Dose/Rate Route Frequency Ordered Stop   05/13/18 2200  amoxicillin-clavulanate (AUGMENTIN) 500-125 MG per tablet 500 mg     500 mg Oral 2 times daily 05/13/18 1556     05/12/18 1415  amoxicillin-clavulanate (AUGMENTIN) 875-125 MG per tablet 1 tablet  Status:  Discontinued     1 tablet Oral Every 12 hours 05/12/18 1402 05/13/18 1556   05/09/18 0200  piperacillin-tazobactam (ZOSYN) IVPB 3.375 g  Status:  Discontinued     3.375 g 12.5 mL/hr over 240 Minutes Intravenous Every 8 hours 05/08/18 1915 05/12/18 1402   05/08/18 1845  piperacillin-tazobactam (ZOSYN) IVPB 3.375 g     3.375 g 100 mL/hr over 30 Minutes Intravenous  Once 05/08/18 1833 05/08/18 1908      Subjective: Complains of hemorrhoids, requesting hemorrhoid suppository   Objective: Vitals:   05/14/18 1506 05/14/18 2146 05/15/18 0534 05/15/18 1400  BP: 111/61 129/68 120/62 119/63  Pulse: 92 82 89 65  Resp: 20 17 16    Temp:  97.7 F (36.5 C) 97.9 F (36.6 C) 97.7 F (36.5 C) 98 F (36.7 C)  TempSrc: Oral Oral Oral Oral  SpO2: 98% 98% 99% 98%  Weight:      Height:        Intake/Output Summary (Last 24 hours) at 05/15/2018 1636 Last data filed at  05/15/2018 1400 Gross per 24 hour  Intake 240.45 ml  Output 45 ml  Net 195.45 ml   Filed Weights   05/08/18 1335  Weight: 35.7 kg    Examination: General exam: Awake, laying in bed, in nad Respiratory system: Normal respiratory effort, no wheezing  Data Reviewed: I have personally reviewed following labs and imaging studies  CBC: Recent Labs  Lab 05/11/18 0354 05/12/18 0418 05/13/18 0314 05/14/18 0320 05/15/18 0603  WBC 13.0* 12.0* 12.9* 12.7* 13.5*  NEUTROABS 12.1* 10.8* 10.8* 10.6* 10.7*  HGB 10.0* 9.9* 9.6* 10.0* 10.5*  HCT 29.2* 28.3* 27.4* 28.6* 31.2*  MCV 84.1 83.2 83.3 83.9 85.7  PLT 420* 386 400 406* 737*   Basic Metabolic Panel: Recent Labs  Lab 05/10/18 0320 05/11/18 0354 05/12/18 0418 05/13/18 0314 05/14/18 0320  NA 133* 134* 132* 138 138  137  K 3.8 2.7* 3.3* 3.0* 3.8  3.7  CL 102 104 103 106 106  105  CO2 22 21* 21* 21* 25  25  GLUCOSE 192* 119* 123* 144* 149*  148*  BUN 15 14 9 8 9  9   CREATININE 0.70 0.48 0.48 0.48 0.40*  0.50  CALCIUM 8.1* 8.2* 7.9* 8.1* 8.5*  8.5*  MG  --   --   --  1.7 1.7  PHOS  --   --   --   --  2.5   GFR: Estimated Creatinine Clearance: 28.4 mL/min (by C-G formula based on SCr of 0.5 mg/dL). Liver Function Tests: Recent Labs  Lab 05/09/18 0420 05/14/18 0320  AST 41  --   ALT 20  --   ALKPHOS 180*  --   BILITOT 1.1  --   PROT 5.3*  --   ALBUMIN 2.3* 2.4*   No results for input(s): LIPASE, AMYLASE in the last 168 hours. No results for input(s): AMMONIA in the last 168 hours. Coagulation Profile: Recent Labs  Lab 05/09/18 1540  INR 1.61   Cardiac Enzymes: No results for input(s): CKTOTAL, CKMB, CKMBINDEX, TROPONINI in the last 168 hours. BNP (last 3 results) No results for input(s): PROBNP in the last 8760 hours. HbA1C: No results for input(s): HGBA1C in the last 72 hours. CBG: No results for input(s): GLUCAP in the last 168 hours. Lipid Profile: No results for input(s): CHOL, HDL, LDLCALC,  TRIG, CHOLHDL, LDLDIRECT in the last 72 hours. Thyroid Function Tests: Recent Labs    05/13/18 0314 05/13/18 0809  TSH 7.152*  --   FREET4  --  0.74*  T3FREE  --  1.4*   Anemia Panel: No results for input(s): VITAMINB12, FOLATE, FERRITIN, TIBC, IRON, RETICCTPCT in the last 72 hours. Sepsis Labs: No results for input(s): PROCALCITON, LATICACIDVEN in the last 168 hours.  Recent Results (from the past 240 hour(s))  Aerobic/Anaerobic Culture (surgical/deep wound)     Status: None   Collection Time: 05/09/18  5:53 PM  Result Value Ref Range Status   Specimen Description   Final    WOUND Performed at [facility] [address] [place], [place] [address]    Special Requests   Final    Normal Performed at [facility], [place]  [address], [place], [place] [address]    Gram Stain   Final    MODERATE WBC PRESENT, PREDOMINANTLY PMN ABUNDANT GRAM POSITIVE COCCI ABUNDANT GRAM NEGATIVE RODS ABUNDANT GRAM POSITIVE RODS Performed at [facility], [place] [address] [place], [place] [address]    Culture   Final    FEW KLEBSIELLA OXYTOCA ABUNDANT VIRIDANS STREPTOCOCCUS CULTURE REINCUBATED FOR BETTER GROWTH MIXED ANAEROBIC FLORA PRESENT.  CALL LAB IF FURTHER IID REQUIRED.    Report Status 05/13/2018 FINAL  Final   Organism ID, Bacteria KLEBSIELLA OXYTOCA  Final   Organism ID, Bacteria VIRIDANS STREPTOCOCCUS  Final      Susceptibility   Klebsiella oxytoca - MIC*    AMPICILLIN RESISTANT Resistant     CEFAZOLIN <=4 SENSITIVE Sensitive     CEFEPIME <=1 SENSITIVE Sensitive     CEFTAZIDIME <=1 SENSITIVE Sensitive     CEFTRIAXONE <=1 SENSITIVE Sensitive     CIPROFLOXACIN <=0.25 SENSITIVE Sensitive     GENTAMICIN <=1 SENSITIVE Sensitive     IMIPENEM <=0.25 SENSITIVE Sensitive     TRIMETH/SULFA <=20 SENSITIVE Sensitive     AMPICILLIN/SULBACTAM <=2 SENSITIVE Sensitive     PIP/TAZO <=4 SENSITIVE Sensitive     Extended ESBL NEGATIVE Sensitive     *  FEW KLEBSIELLA OXYTOCA   Viridans streptococcus - MIC*    ERYTHROMYCIN <=0.12 SENSITIVE Sensitive     LEVOFLOXACIN 0.5 SENSITIVE Sensitive     VANCOMYCIN 0.5 SENSITIVE Sensitive     * ABUNDANT VIRIDANS STREPTOCOCCUS  MRSA PCR Screening     Status: None   Collection Time: 05/12/18  7:19 PM  Result Value Ref Range Status   MRSA by PCR NEGATIVE NEGATIVE Final    Comment:        The GeneXpert MRSA Assay (FDA approved for NASAL specimens only), is one component of a comprehensive MRSA colonization surveillance program. It is not intended to diagnose MRSA infection nor to guide or monitor treatment for MRSA infections. Performed at [facility], [place] [address], [place], [place] [address]      Radiology Studies: No results found.  Scheduled Meds: . acetaminophen (TYLENOL) oral liquid 160 mg/5 mL  650 mg Oral Q6H  . amoxicillin-clavulanate  500 mg Oral BID  . apixaban  2.5 mg Oral BID  . diltiazem  120 mg Oral Q12H  . docusate sodium  100 mg Oral Daily  . feeding supplement (ENSURE ENLIVE)  237 mL Oral BID BM  . levothyroxine  25 mcg Oral QAC breakfast  . metoprolol tartrate  25 mg Oral BID  . polyethylene glycol  17 g Oral Daily  . potassium chloride  40 mEq Oral BID  . sodium chloride flush  5 mL Intracatheter Q8H   Continuous Infusions: . sodium chloride Stopped (05/09/18 1709)  . sodium chloride 10 mL/hr at 05/14/18 1441     LOS: 7 days   Marylu Lund, MD [facility] Pager On Amion  If 7PM-7AM, please contact night-coverage 05/15/2018, 4:36 PM

## 2018-05-16 ENCOUNTER — Ambulatory Visit: Payer: Medicare Other | Admitting: Radiation Oncology

## 2018-05-16 ENCOUNTER — Ambulatory Visit: Payer: Medicare Other

## 2018-05-16 LAB — CBC WITH DIFFERENTIAL/PLATELET
Basophils Absolute: 0 10*3/uL (ref 0.0–0.1)
Basophils Relative: 0 %
EOS ABS: 0.2 10*3/uL (ref 0.0–0.7)
Eosinophils Relative: 1 %
HEMATOCRIT: 30.1 % — AB (ref 36.0–46.0)
HEMOGLOBIN: 10.1 g/dL — AB (ref 12.0–15.0)
LYMPHS ABS: 1.1 10*3/uL (ref 0.7–4.0)
Lymphocytes Relative: 8 %
MCH: 29.1 pg (ref 26.0–34.0)
MCHC: 33.6 g/dL (ref 30.0–36.0)
MCV: 86.7 fL (ref 78.0–100.0)
MONOS PCT: 8 %
Monocytes Absolute: 1.2 10*3/uL — ABNORMAL HIGH (ref 0.1–1.0)
NEUTROS ABS: 12.3 10*3/uL — AB (ref 1.7–7.7)
NEUTROS PCT: 83 %
Platelets: 505 10*3/uL — ABNORMAL HIGH (ref 150–400)
RBC: 3.47 MIL/uL — AB (ref 3.87–5.11)
RDW: 15.1 % (ref 11.5–15.5)
WBC: 14.8 10*3/uL — AB (ref 4.0–10.5)

## 2018-05-16 NOTE — Progress Notes (Signed)
PROGRESS NOTE    Summer Jones  VHQ:[id number] DOB: 09/07/1929 DOA: 05/08/2018 PCP: Crist Infante, MD    Brief Narrative:  70 WF history of gastric outlet obstruction-- gastro-jejunostomy on 11/2017,  history of CBD obstruction status post biliary stents,  pancreatic cancer with possible liver mets,  hypertension  Admit 9/22 RUQ abdominal pain, with associated distention, nausea, poor p.o. intake for the past 3 days.    In the ED, CT abdomen and ultrasound showed possible acute cholecystitis, small amount of ascites, with known pancreatic cancer with mets to the liver.  Patient admitted for further management.  General surgery on consult.   Assessment & Plan:   Principal Problem:   Cholecystitis Active Problems:   Hyponatremia   Essential hypertension   Pancreatic cancer metastasized to liver [facility])   Palliative care by specialist   Pain of metastatic malignancy   Goals of care, counseling/discussion   Advance care planning   Malnutrition of moderate degree  New onset atrial fibrillation chads score >3 on 9/26-converted to NSR 9/27 Appreciate cardiology input -continued on Cardizem 60 every 6 and Lopressor 25 twice daily -currently on eliquis, tolerating. No signs of acute blood loss  Acute cholecystitis s/p percutaneous cholecystostomy on 05/09/18 by IR Afebrile, wbc 18-->13-12.9 Surgical deep wound showed Klebsiella and viridans streptococci and patient is now on oral Augmentin CT abdomen/pelvis showed persistent gallbladder dilatation?suggesting cholecystitis General surgery consulted, noted to be a poor surgical candidate--s/p perc chole drain 9/23-follow up with IR Continue IV Zosyn-->Augmentin for 2 weeks per Dr. Barry Dienes who will follow in OP Continue analgesics as needed On regular diet  Mild hyponatremia Resolved with IVF hydration Repeat bmet in AM  Hypokalemia Continue scheduled potassium replacement  Normocytic anemia 2/2 likely malignancy Hgb had  remained stable Anemia panel showed iron 5, sats 3, ferritin 419, folate 14, Vit B12 2,366 S/P 1 dose of feraheme on 05/10/18 -Hemodynamically stable  Hypothyroidism Continue synthroid as tolerated Medically stable at this time  Metastatic pancreatic CA to the liver Planned to start palliative rad on 05/09/18 Oncology folloiwng  HTn held amlodipine and lisnopril at present secondary to soft BP to allow for cardizem -Continue on cardizem and metoprolol  DVT prophylaxis: eliquis Code Status: DNR Family Communication: Pt in room, family at bedside Disposition Plan: SNF, timing uncertain   Consultants:   General Surgery  IR  Procedures:  Percutaneous cholecystostomy on 05/09/18 by IR  Antimicrobials: Anti-infectives (From admission, onward)   Start     Dose/Rate Route Frequency Ordered Stop   05/13/18 2200  amoxicillin-clavulanate (AUGMENTIN) 500-125 MG per tablet 500 mg     500 mg Oral 2 times daily 05/13/18 1556     05/12/18 1415  amoxicillin-clavulanate (AUGMENTIN) 875-125 MG per tablet 1 tablet  Status:  Discontinued     1 tablet Oral Every 12 hours 05/12/18 1402 05/13/18 1556   05/09/18 0200  piperacillin-tazobactam (ZOSYN) IVPB 3.375 g  Status:  Discontinued     3.375 g 12.5 mL/hr over 240 Minutes Intravenous Every 8 hours 05/08/18 1915 05/12/18 1402   05/08/18 1845  piperacillin-tazobactam (ZOSYN) IVPB 3.375 g     3.375 g 100 mL/hr over 30 Minutes Intravenous  Once 05/08/18 1833 05/08/18 1908      Subjective: Without complaints at this time  Objective: Vitals:   05/15/18 1400 05/15/18 1956 05/16/18 0547 05/16/18 1445  BP: 119/63 118/62 (!) 141/69 113/66  Pulse: 65 66 67 (!) 59  Resp:  16 16   Temp: 98 F (36.7 C)  98.1 F (36.7 C) 98.2 F (36.8 C) 98 F (36.7 C)  TempSrc: Oral Oral Oral Oral  SpO2: 98% 97% 90% 95%  Weight:      Height:        Intake/Output Summary (Last 24 hours) at 05/16/2018 1828 Last data filed at 05/16/2018 1240 Gross per 24  hour  Intake 240 ml  Output 100 ml  Net 140 ml   Filed Weights   05/08/18 1335  Weight: 35.7 kg    Examination: General exam: Conversant, in no acute distress Respiratory system: normal chest rise, clear, no audible wheezing  Data Reviewed: I have personally reviewed following labs and imaging studies  CBC: Recent Labs  Lab 05/12/18 0418 05/13/18 0314 05/14/18 0320 05/15/18 0603 05/16/18 0539  WBC 12.0* 12.9* 12.7* 13.5* 14.8*  NEUTROABS 10.8* 10.8* 10.6* 10.7* 12.3*  HGB 9.9* 9.6* 10.0* 10.5* 10.1*  HCT 28.3* 27.4* 28.6* 31.2* 30.1*  MCV 83.2 83.3 83.9 85.7 86.7  PLT 386 400 406* 507* 716*   Basic Metabolic Panel: Recent Labs  Lab 05/10/18 0320 05/11/18 0354 05/12/18 0418 05/13/18 0314 05/14/18 0320  NA 133* 134* 132* 138 138  137  K 3.8 2.7* 3.3* 3.0* 3.8  3.7  CL 102 104 103 106 106  105  CO2 22 21* 21* 21* 25  25  GLUCOSE 192* 119* 123* 144* 149*  148*  BUN 15 14 9 8 9  9   CREATININE 0.70 0.48 0.48 0.48 0.40*  0.50  CALCIUM 8.1* 8.2* 7.9* 8.1* 8.5*  8.5*  MG  --   --   --  1.7 1.7  PHOS  --   --   --   --  2.5   GFR: Estimated Creatinine Clearance: 28.4 mL/min (by C-G formula based on SCr of 0.5 mg/dL). Liver Function Tests: Recent Labs  Lab 05/14/18 0320  ALBUMIN 2.4*   No results for input(s): LIPASE, AMYLASE in the last 168 hours. No results for input(s): AMMONIA in the last 168 hours. Coagulation Profile: No results for input(s): INR, PROTIME in the last 168 hours. Cardiac Enzymes: No results for input(s): CKTOTAL, CKMB, CKMBINDEX, TROPONINI in the last 168 hours. BNP (last 3 results) No results for input(s): PROBNP in the last 8760 hours. HbA1C: No results for input(s): HGBA1C in the last 72 hours. CBG: No results for input(s): GLUCAP in the last 168 hours. Lipid Profile: No results for input(s): CHOL, HDL, LDLCALC, TRIG, CHOLHDL, LDLDIRECT in the last 72 hours. Thyroid Function Tests: No results for input(s): TSH, T4TOTAL,  FREET4, T3FREE, THYROIDAB in the last 72 hours. Anemia Panel: No results for input(s): VITAMINB12, FOLATE, FERRITIN, TIBC, IRON, RETICCTPCT in the last 72 hours. Sepsis Labs: No results for input(s): PROCALCITON, LATICACIDVEN in the last 168 hours.  Recent Results (from the past 240 hour(s))  Aerobic/Anaerobic Culture (surgical/deep wound)     Status: None   Collection Time: 05/09/18  5:53 PM  Result Value Ref Range Status   Specimen Description   Final    WOUND Performed at [facility] [address], [place], [place] [address]    Special Requests   Final    Normal Performed at [facility], [place] [address] [place], [place] [address]    Gram Stain   Final    MODERATE WBC PRESENT, PREDOMINANTLY PMN ABUNDANT GRAM POSITIVE COCCI ABUNDANT GRAM NEGATIVE RODS ABUNDANT GRAM POSITIVE RODS Performed at [facility], [place] [address], [place], [place] [address]    Culture  Final    FEW KLEBSIELLA OXYTOCA ABUNDANT VIRIDANS STREPTOCOCCUS CULTURE REINCUBATED FOR BETTER GROWTH MIXED ANAEROBIC FLORA PRESENT.  CALL LAB IF FURTHER IID REQUIRED.    Report Status 05/13/2018 FINAL  Final   Organism ID, Bacteria KLEBSIELLA OXYTOCA  Final   Organism ID, Bacteria VIRIDANS STREPTOCOCCUS  Final      Susceptibility   Klebsiella oxytoca - MIC*    AMPICILLIN RESISTANT Resistant     CEFAZOLIN <=4 SENSITIVE Sensitive     CEFEPIME <=1 SENSITIVE Sensitive     CEFTAZIDIME <=1 SENSITIVE Sensitive     CEFTRIAXONE <=1 SENSITIVE Sensitive     CIPROFLOXACIN <=0.25 SENSITIVE Sensitive     GENTAMICIN <=1 SENSITIVE Sensitive     IMIPENEM <=0.25 SENSITIVE Sensitive     TRIMETH/SULFA <=20 SENSITIVE Sensitive     AMPICILLIN/SULBACTAM <=2 SENSITIVE Sensitive     PIP/TAZO <=4 SENSITIVE Sensitive     Extended ESBL NEGATIVE Sensitive     * FEW KLEBSIELLA OXYTOCA   Viridans streptococcus - MIC*    ERYTHROMYCIN <=0.12 SENSITIVE Sensitive     LEVOFLOXACIN 0.5 SENSITIVE  Sensitive     VANCOMYCIN 0.5 SENSITIVE Sensitive     * ABUNDANT VIRIDANS STREPTOCOCCUS  MRSA PCR Screening     Status: None   Collection Time: 05/12/18  7:19 PM  Result Value Ref Range Status   MRSA by PCR NEGATIVE NEGATIVE Final    Comment:        The GeneXpert MRSA Assay (FDA approved for NASAL specimens only), is one component of a comprehensive MRSA colonization surveillance program. It is not intended to diagnose MRSA infection nor to guide or monitor treatment for MRSA infections. Performed at [facility], [place] [address], [place], [place] [address]      Radiology Studies: No results found.  Scheduled Meds: . acetaminophen (TYLENOL) oral liquid 160 mg/5 mL  650 mg Oral Q6H  . amoxicillin-clavulanate  500 mg Oral BID  . apixaban  2.5 mg Oral BID  . diltiazem  120 mg Oral Q12H  . docusate sodium  100 mg Oral Daily  . feeding supplement (ENSURE ENLIVE)  237 mL Oral BID BM  . levothyroxine  25 mcg Oral QAC breakfast  . metoprolol tartrate  25 mg Oral BID  . polyethylene glycol  17 g Oral Daily  . potassium chloride  40 mEq Oral BID  . sodium chloride flush  5 mL Intracatheter Q8H   Continuous Infusions: . sodium chloride Stopped (05/09/18 1709)  . sodium chloride 10 mL/hr at 05/14/18 1441     LOS: 8 days   Marylu Lund, MD [facility] Pager On Amion  If 7PM-7AM, please contact night-coverage 05/16/2018, 6:28 PM

## 2018-05-16 NOTE — Progress Notes (Addendum)
CSW met with patient and family at bedside to present SNF bed offers.   Patient would like to pursue a bed offer at [facility].   [place] to start insurance authorization.   Stephanie Acre, [place] Social Worker [phone]

## 2018-05-17 ENCOUNTER — Ambulatory Visit: Payer: Medicare Other | Admitting: Radiation Oncology

## 2018-05-17 ENCOUNTER — Ambulatory Visit: Payer: Medicare Other

## 2018-05-17 NOTE — Progress Notes (Signed)
Physical Therapy Treatment Patient Details Name: Summer Jones MRN: [id number] DOB: 29-Apr-1930 Today's Date: 05/17/2018    History of Present Illness 84 year old female with history of gastric outlet obstruction status post gastro-jejunostomy on 11/2017, history of CBD obstruction status post biliary stents, pancreatic cancer with possible liver mets, hypertension presented to the ED with complaints of worsening RUQ abdominal pain, with associated distention, nausea, poor p.o. intake for the past 3 days. Pt is post op percutaneous cholecystectomy surgery     PT Comments    Pt ambulated well today, requiring 3 standing rest breaks given dyspnea. Pt reports anxiety when experiencing breathlessness, especially with HOB lowered. Pt demonstrated LE weakness R>L during LE exercises. PT to continue to follow acutely and will progress mobility as able.    Follow Up Recommendations  SNF;Supervision for mobility/OOB     Equipment Recommendations  Other (comment)(rollator)    Recommendations for Other Services       Precautions / Restrictions Precautions Precautions: Fall Restrictions Weight Bearing Restrictions: No    Mobility  Bed Mobility Overal bed mobility: Needs Assistance Bed Mobility: Supine to Sit     Supine to sit: Min assist     General bed mobility comments: Min assist for trunk elevation, scooting to EOB. Mod assist for scooting up in bed. Pt panicked with lowering head of bed to scoot up due to breathlessness and difficulty with breathing with HOB lowered.   Transfers Overall transfer level: Needs assistance Equipment used: Rolling walker (2 wheeled) Transfers: Sit to/from Stand Sit to Stand: Min assist         General transfer comment: Min assist for power up and steadying upon standing. Pt using both hands on RW to stand.   Ambulation/Gait Ambulation/Gait assistance: Min guard Gait Distance (Feet): 150 Feet(1x75 ft, 1x25 ft, 1x50 ft (requiring standing rest  breaks)) Assistive device: Rolling walker (2 wheeled) Gait Pattern/deviations: Step-through pattern;Decreased stride length;Trunk flexed Gait velocity: decreased   General Gait Details: Sitting rest breaks x3 during ambulation, for feelings of "breathlessness". O2sats at 94% on RA during first standing rest break, pt advised to take standing breaks as needed during ambulation. Verbal cuing for placement in RW.    Stairs             Wheelchair Mobility    Modified Rankin (Stroke Patients Only)       Balance Overall balance assessment: Needs assistance Sitting-balance support: Feet supported Sitting balance-Leahy Scale: Good Sitting balance - Comments: static sitting able to maintain balance without support. dynamic sitting required propping of UEs.    Standing balance support: Bilateral upper extremity supported Standing balance-Leahy Scale: Poor Standing balance comment: Relies on RW for UE support, does not accept challenge                             Cognition Arousal/Alertness: Awake/alert Behavior During Therapy: WFL for tasks assessed/performed Overall Cognitive Status: Within Functional Limits for tasks assessed                                        Exercises General Exercises - Lower Extremity Long Arc Quad: AROM;Both;10 reps;Seated(Difficulty with LAQ R>L pt states due to abdominal incision) Hip Flexion/Marching: AROM;Both;10 reps;Seated    General Comments General comments (skin integrity, edema, etc.): Pt with abdominal swelling today secondary to constipation. MD ordering enema to address.  Pertinent Vitals/Pain Pain Assessment: 0-10 Pain Score: 8  Pain Location: abd Pain Descriptors / Indicators: Discomfort Pain Intervention(s): Limited activity within patient's tolerance;Repositioned;Monitored during session    Home Living                      Prior Function            PT Goals (current goals can  now be found in the care plan section) Acute Rehab PT Goals PT Goal Formulation: With patient/family Time For Goal Achievement: 05/25/18 Potential to Achieve Goals: Good Progress towards PT goals: Progressing toward goals    Frequency    Min 2X/week      PT Plan Current plan remains appropriate    Co-evaluation              AM-PAC PT "6 Clicks" Daily Activity  Outcome Measure  Difficulty turning over in bed (including adjusting bedclothes, sheets and blankets)?: Unable Difficulty moving from lying on back to sitting on the side of the bed? : Unable Difficulty sitting down on and standing up from a chair with arms (e.g., wheelchair, bedside commode, etc,.)?: Unable Help needed moving to and from a bed to chair (including a wheelchair)?: A Little Help needed walking in hospital room?: A Little Help needed climbing 3-5 steps with a railing? : A Lot 6 Click Score: 11    End of Session Equipment Utilized During Treatment: Gait belt Activity Tolerance: Patient tolerated treatment well;Patient limited by pain Patient left: in bed;with bed alarm set;with call bell/phone within reach;with family/visitor present Nurse Communication: Mobility status PT Visit Diagnosis: Unsteadiness on feet (R26.81);Difficulty in walking, not elsewhere classified (R26.2)     Time: 1224-8250 PT Time Calculation (min) (ACUTE ONLY): 24 min  Charges:  $Gait Training: 8-22 mins $Therapeutic Activity: 8-22 mins                     Summer Jones, PT Acute Rehabilitation Services Pager [phone]  Office [phone]   Summer Jones 05/17/2018, 1:07 PM

## 2018-05-17 NOTE — Progress Notes (Signed)
PROGRESS NOTE    Summer Jones  IDP:[id number] DOB: 1930-01-19 DOA: 05/08/2018 PCP: Crist Infante, MD    Brief Narrative:  62 WF history of gastric outlet obstruction-- gastro-jejunostomy on 11/2017,  history of CBD obstruction status post biliary stents,  pancreatic cancer with possible liver mets,  hypertension  Admit 9/22 RUQ abdominal pain, with associated distention, nausea, poor p.o. intake for the past 3 days.    In the ED, CT abdomen and ultrasound showed possible acute cholecystitis, small amount of ascites, with known pancreatic cancer with mets to the liver.  Patient admitted for further management.  General surgery on consult.   Assessment & Plan:   Principal Problem:   Cholecystitis Active Problems:   Hyponatremia   Essential hypertension   Pancreatic cancer metastasized to liver [facility])   Palliative care by specialist   Pain of metastatic malignancy   Goals of care, counseling/discussion   Advance care planning   Malnutrition of moderate degree  New onset atrial fibrillation chads score >3 on 9/26-converted to NSR 9/27 Appreciate cardiology input -continued on Cardizem 60 every 6 and Lopressor 25 twice daily -currently on eliquis, tolerating. No signs of acute blood loss  Acute cholecystitis s/p percutaneous cholecystostomy on 05/09/18 by IR -Afebrile, wbc 18-->13-12.9 -Surgical deep wound showed Klebsiella and viridans streptococci and patient is now on oral Augmentin -CT abdomen/pelvis showed persistent gallbladder dilatation?suggesting cholecystitis -General surgery consulted, noted to be a poor surgical candidate--s/p perc chole drain 9/23-follow up with IR -Initially continued IV Zosyn-->Augmentin for 2 weeks per Dr. Barry Dienes who will follow in OP (anticipated stop date of abx 10/5) -Continue analgesics as needed -Continue on regular diet  Mild hyponatremia -Resolved with IVF hydration -Reviewed electrolyte profile  Hypokalemia -Continue scheduled  potassium replacement -Stable  Normocytic anemia 2/2 likely malignancy -Hgb had remained stable -Anemia panel showed iron 5, sats 3, ferritin 419, folate 14, Vit B12 2,366 -Given 1 dose of feraheme on 05/10/18 -Hemodynamically stable at this time  Hypothyroidism -Continue synthroid as tolerated -Medically stable currently  Metastatic pancreatic CA to the liver -Planned to start palliative rad on 05/09/18 -Oncology had been following  HTn -held amlodipine and lisnopril at present secondary to soft BP to allow for cardizem -Continued on cardizem and metoprolol -HR in the 50-60's  Constipation -complains of abd tenderness -abd distended, pos BS -Will give trial of soap suds enema  DVT prophylaxis: eliquis Code Status: DNR Family Communication: Pt in room, family at bedside Disposition Plan: SNF, timing uncertain   Consultants:   General Surgery  IR  Procedures:  Percutaneous cholecystostomy on 05/09/18 by IR  Antimicrobials: Anti-infectives (From admission, onward)   Start     Dose/Rate Route Frequency Ordered Stop   05/13/18 2200  amoxicillin-clavulanate (AUGMENTIN) 500-125 MG per tablet 500 mg     500 mg Oral 2 times daily 05/13/18 1556     05/12/18 1415  amoxicillin-clavulanate (AUGMENTIN) 875-125 MG per tablet 1 tablet  Status:  Discontinued     1 tablet Oral Every 12 hours 05/12/18 1402 05/13/18 1556   05/09/18 0200  piperacillin-tazobactam (ZOSYN) IVPB 3.375 g  Status:  Discontinued     3.375 g 12.5 mL/hr over 240 Minutes Intravenous Every 8 hours 05/08/18 1915 05/12/18 1402   05/08/18 1845  piperacillin-tazobactam (ZOSYN) IVPB 3.375 g     3.375 g 100 mL/hr over 30 Minutes Intravenous  Once 05/08/18 1833 05/08/18 1908      Subjective: Complains of constipation  Objective: Vitals:   05/17/18 0413 05/17/18 0800  05/17/18 1359 05/17/18 1415  BP: 123/65  119/63   Pulse: (!) 52 62 (!) 51 60  Resp:  16 (!) 24 18  Temp: (!) 97.4 F (36.3 C) 98 F (36.7  C) 97.7 F (36.5 C)   TempSrc: Oral Oral Oral   SpO2: 93%  97%   Weight:      Height:        Intake/Output Summary (Last 24 hours) at 05/17/2018 1600 Last data filed at 05/17/2018 1017 Gross per 24 hour  Intake 283.58 ml  Output 75 ml  Net 208.58 ml   Filed Weights   05/08/18 1335  Weight: 35.7 kg    Examination: General exam: Awake, laying in bed, in nad Respiratory system: Normal respiratory effort, no wheezing Gastrointestinal system: pos BS, distended, generally tender  Data Reviewed: I have personally reviewed following labs and imaging studies  CBC: Recent Labs  Lab 05/12/18 0418 05/13/18 0314 05/14/18 0320 05/15/18 0603 05/16/18 0539  WBC 12.0* 12.9* 12.7* 13.5* 14.8*  NEUTROABS 10.8* 10.8* 10.6* 10.7* 12.3*  HGB 9.9* 9.6* 10.0* 10.5* 10.1*  HCT 28.3* 27.4* 28.6* 31.2* 30.1*  MCV 83.2 83.3 83.9 85.7 86.7  PLT 386 400 406* 507* 086*   Basic Metabolic Panel: Recent Labs  Lab 05/11/18 0354 05/12/18 0418 05/13/18 0314 05/14/18 0320  NA 134* 132* 138 138  137  K 2.7* 3.3* 3.0* 3.8  3.7  CL 104 103 106 106  105  CO2 21* 21* 21* 25  25  GLUCOSE 119* 123* 144* 149*  148*  BUN 14 9 8 9  9   CREATININE 0.48 0.48 0.48 0.40*  0.50  CALCIUM 8.2* 7.9* 8.1* 8.5*  8.5*  MG  --   --  1.7 1.7  PHOS  --   --   --  2.5   GFR: Estimated Creatinine Clearance: 28.4 mL/min (by C-G formula based on SCr of 0.5 mg/dL). Liver Function Tests: Recent Labs  Lab 05/14/18 0320  ALBUMIN 2.4*   No results for input(s): LIPASE, AMYLASE in the last 168 hours. No results for input(s): AMMONIA in the last 168 hours. Coagulation Profile: No results for input(s): INR, PROTIME in the last 168 hours. Cardiac Enzymes: No results for input(s): CKTOTAL, CKMB, CKMBINDEX, TROPONINI in the last 168 hours. BNP (last 3 results) No results for input(s): PROBNP in the last 8760 hours. HbA1C: No results for input(s): HGBA1C in the last 72 hours. CBG: No results for input(s):  GLUCAP in the last 168 hours. Lipid Profile: No results for input(s): CHOL, HDL, LDLCALC, TRIG, CHOLHDL, LDLDIRECT in the last 72 hours. Thyroid Function Tests: No results for input(s): TSH, T4TOTAL, FREET4, T3FREE, THYROIDAB in the last 72 hours. Anemia Panel: No results for input(s): VITAMINB12, FOLATE, FERRITIN, TIBC, IRON, RETICCTPCT in the last 72 hours. Sepsis Labs: No results for input(s): PROCALCITON, LATICACIDVEN in the last 168 hours.  Recent Results (from the past 240 hour(s))  Aerobic/Anaerobic Culture (surgical/deep wound)     Status: None   Collection Time: 05/09/18  5:53 PM  Result Value Ref Range Status   Specimen Description   Final    WOUND Performed at [facility] [address], [place], [place] [address]    Special Requests   Final    Normal Performed at [facility], [place] [address] [place], [place] [address]    Gram Stain   Final    MODERATE WBC PRESENT, PREDOMINANTLY PMN ABUNDANT GRAM POSITIVE COCCI ABUNDANT GRAM NEGATIVE RODS ABUNDANT GRAM POSITIVE  RODS Performed at [facility], [place] [address], [place], [place] [address]    Culture   Final    FEW KLEBSIELLA OXYTOCA ABUNDANT VIRIDANS STREPTOCOCCUS CULTURE REINCUBATED FOR BETTER GROWTH MIXED ANAEROBIC FLORA PRESENT.  CALL LAB IF FURTHER IID REQUIRED.    Report Status 05/13/2018 FINAL  Final   Organism ID, Bacteria KLEBSIELLA OXYTOCA  Final   Organism ID, Bacteria VIRIDANS STREPTOCOCCUS  Final      Susceptibility   Klebsiella oxytoca - MIC*    AMPICILLIN RESISTANT Resistant     CEFAZOLIN <=4 SENSITIVE Sensitive     CEFEPIME <=1 SENSITIVE Sensitive     CEFTAZIDIME <=1 SENSITIVE Sensitive     CEFTRIAXONE <=1 SENSITIVE Sensitive     CIPROFLOXACIN <=0.25 SENSITIVE Sensitive     GENTAMICIN <=1 SENSITIVE Sensitive     IMIPENEM <=0.25 SENSITIVE Sensitive     TRIMETH/SULFA <=20 SENSITIVE Sensitive     AMPICILLIN/SULBACTAM <=2 SENSITIVE Sensitive      PIP/TAZO <=4 SENSITIVE Sensitive     Extended ESBL NEGATIVE Sensitive     * FEW KLEBSIELLA OXYTOCA   Viridans streptococcus - MIC*    ERYTHROMYCIN <=0.12 SENSITIVE Sensitive     LEVOFLOXACIN 0.5 SENSITIVE Sensitive     VANCOMYCIN 0.5 SENSITIVE Sensitive     * ABUNDANT VIRIDANS STREPTOCOCCUS  MRSA PCR Screening     Status: None   Collection Time: 05/12/18  7:19 PM  Result Value Ref Range Status   MRSA by PCR NEGATIVE NEGATIVE Final    Comment:        The GeneXpert MRSA Assay (FDA approved for NASAL specimens only), is one component of a comprehensive MRSA colonization surveillance program. It is not intended to diagnose MRSA infection nor to guide or monitor treatment for MRSA infections. Performed at [facility], [place] [address] [place], [place] [address]      Radiology Studies: No results found.  Scheduled Meds: . acetaminophen (TYLENOL) oral liquid 160 mg/5 mL  650 mg Oral Q6H  . amoxicillin-clavulanate  500 mg Oral BID  . apixaban  2.5 mg Oral BID  . diltiazem  120 mg Oral Q12H  . docusate sodium  100 mg Oral Daily  . feeding supplement (ENSURE ENLIVE)  237 mL Oral BID BM  . levothyroxine  25 mcg Oral QAC breakfast  . metoprolol tartrate  25 mg Oral BID  . polyethylene glycol  17 g Oral Daily  . potassium chloride  40 mEq Oral BID  . sodium chloride flush  5 mL Intracatheter Q8H   Continuous Infusions: . sodium chloride Stopped (05/09/18 1709)  . sodium chloride Stopped (05/15/18 1751)     LOS: 9 days   Marylu Lund, MD [facility] Pager On Amion  If 7PM-7AM, please contact night-coverage 05/17/2018, 4:00 PM

## 2018-05-17 NOTE — Care Management Important Message (Signed)
Important Message  Patient Details  Name: BRAYLEE LAL MRN: [id number] Date of Birth: January 18, 1930   Medicare Important Message Given:  Yes    Kerin Salen 05/17/2018, 12:30 PMImportant Message  Patient Details  Name: KIMBERLI WINNE MRN: [id number] Date of Birth: Jun 29, 1930   Medicare Important Message Given:  Yes    Kerin Salen 05/17/2018, 12:30 PM

## 2018-05-17 NOTE — Progress Notes (Signed)
Nutrition Follow-up  DOCUMENTATION CODES:   Non-severe (moderate) malnutrition in context of chronic illness, Underweight  INTERVENTION:   Continue Ensure Enlive po BID, each supplement provides 350 kcal and 20 grams of protein  NUTRITION DIAGNOSIS:   Moderate Malnutrition related to chronic illness, cancer and cancer related treatments as evidenced by energy intake < or equal to 75% for > or equal to 1 month, severe fat depletion, moderate muscle depletion.  Ongoing.  GOAL:   Patient will meet greater than or equal to 90% of their needs  Not meeting.  MONITOR:   PO intake, Diet advancement, Supplement acceptance, Labs, Weight trends, I & O's  REASON FOR ASSESSMENT:   Consult Assessment of nutrition requirement/status  ASSESSMENT:   84 year old female with history of gastric outlet obstruction status post gastro-jejunostomy on 11/2017, history of CBD obstruction status post biliary stents, pancreatic cancer with possible liver mets, hypertension presented to the ED with complaints of worsening RUQ abdominal pain, with associated distention, nausea, poor p.o. intake for the past 3 days.   Patient consuming 25-50% of meals, with only 1 Ensure drank yesterday. Refused Ensure this morning.   No new weights recorded since admission 9/22.  Labs reviewed. Medications: colace capsule daily, Miralax packet daily, K-DUR tablet BID  Diet Order:   Diet Order            Diet regular Room service appropriate? Yes; Fluid consistency: Thin  Diet effective now              EDUCATION NEEDS:   Education needs have been addressed  Skin:  Skin Assessment: Reviewed RN Assessment  Last BM:  9/30  Height:   Ht Readings from Last 1 Encounters:  05/08/18 4' 11.49" (1.511 m)    Weight:   Wt Readings from Last 1 Encounters:  05/08/18 35.7 kg    Ideal Body Weight:  44.7 kg  BMI:  Body mass index is 15.66 kg/m.  Estimated Nutritional Needs:   Kcal:   1350-1550  Protein:  60-70g  Fluid:  1.5L/day  Clayton Bibles, MS, RD, LDN [place] Dietitian Pager: [phone] After Hours Pager: [phone]

## 2018-05-18 ENCOUNTER — Inpatient Hospital Stay (HOSPITAL_COMMUNITY): Payer: Medicare Other

## 2018-05-18 ENCOUNTER — Ambulatory Visit: Payer: Medicare Other

## 2018-05-18 DIAGNOSIS — I4891 Unspecified atrial fibrillation: Secondary | ICD-10-CM

## 2018-05-18 DIAGNOSIS — R001 Bradycardia, unspecified: Secondary | ICD-10-CM

## 2018-05-18 LAB — BASIC METABOLIC PANEL
ANION GAP: 8 (ref 5–15)
ANION GAP: 8 (ref 5–15)
BUN: 10 mg/dL (ref 8–23)
BUN: 12 mg/dL (ref 8–23)
CALCIUM: 8.3 mg/dL — AB (ref 8.9–10.3)
CALCIUM: 8.5 mg/dL — AB (ref 8.9–10.3)
CHLORIDE: 90 mmol/L — AB (ref 98–111)
CHLORIDE: 92 mmol/L — AB (ref 98–111)
CO2: 27 mmol/L (ref 22–32)
CO2: 27 mmol/L (ref 22–32)
CREATININE: 0.56 mg/dL (ref 0.44–1.00)
CREATININE: 0.53 mg/dL (ref 0.44–1.00)
GFR calc non Af Amer: >60 mL/min (ref >60)
GFR calc non Af Amer: >60 mL/min (ref >60)
GLUCOSE: 189 mg/dL — AB (ref 70–99)
Glucose, Bld: 153 mg/dL — ABNORMAL HIGH (ref 70–99)
Potassium: 6.3 mmol/L (ref 3.5–5.1)
Potassium: 5.9 mmol/L — ABNORMAL HIGH (ref 3.5–5.1)
SODIUM: 127 mmol/L — AB (ref 135–145)
Sodium: 125 mmol/L — ABNORMAL LOW (ref 135–145)

## 2018-05-18 LAB — GLUCOSE, CAPILLARY: Glucose-Capillary: 156 mg/dL — ABNORMAL HIGH (ref 70–99)

## 2018-05-18 LAB — TROPONIN I
TROPONIN I: 0.04 ng/mL — AB (ref <0.03)
Troponin I: 0.03 ng/mL (ref <0.03)

## 2018-05-18 LAB — MAGNESIUM: Magnesium: 1.8 mg/dL (ref 1.7–2.4)

## 2018-05-18 MED ORDER — DILTIAZEM HCL 60 MG PO TABS: 60.0000 mg | ORAL_TABLET | Freq: Two times a day (BID) | ORAL | Status: DC

## 2018-05-18 MED ORDER — LORAZEPAM 0.5 MG PO TABS: 0.2500 mg | ORAL_TABLET | Freq: Four times a day (QID) | ORAL | 0 refills | Status: AC | PRN

## 2018-05-18 MED ORDER — AMOXICILLIN-POT CLAVULANATE 500-125 MG PO TABS: 500.0000 mg | ORAL_TABLET | Freq: Two times a day (BID) | ORAL | 0 refills | Status: DC

## 2018-05-18 MED ORDER — GLUCAGON HCL RDNA (DIAGNOSTIC) 1 MG IJ SOLR
INTRAMUSCULAR | Status: AC
  Administered 2018-05-18: 16:00:00
  Filled 2018-05-18: qty 1

## 2018-05-18 MED ORDER — APIXABAN 2.5 MG PO TABS: 2.5000 mg | ORAL_TABLET | Freq: Two times a day (BID) | ORAL | Status: AC

## 2018-05-18 MED ORDER — SODIUM CHLORIDE 0.9 % IV SOLN
INTRAVENOUS | Status: DC
  Administered 2018-05-19: 06:00:00 via INTRAVENOUS

## 2018-05-18 MED ORDER — LORAZEPAM 0.5 MG PO TABS
0.2500 mg | ORAL_TABLET | Freq: Once | ORAL | Status: AC
  Administered 2018-05-18: 0.25 mg via ORAL
  Filled 2018-05-18: qty 1

## 2018-05-18 MED ORDER — GLUCAGON HCL RDNA (DIAGNOSTIC) 1 MG IJ SOLR
3.0000 mg | Freq: Once | INTRAVENOUS | Status: AC
  Administered 2018-05-18: 3 mg via INTRAVENOUS
  Filled 2018-05-18: qty 3

## 2018-05-18 MED ORDER — ALPRAZOLAM 0.5 MG PO TABS
0.5000 mg | ORAL_TABLET | Freq: Every evening | ORAL | Status: DC | PRN
  Administered 2018-05-21: 0.5 mg via ORAL
  Filled 2018-05-18 (×2): qty 1

## 2018-05-18 MED ORDER — ONDANSETRON HCL 4 MG PO TABS: 4.0000 mg | ORAL_TABLET | Freq: Four times a day (QID) | ORAL | 0 refills | Status: AC | PRN

## 2018-05-18 MED ORDER — ATROPINE SULFATE 1 MG/10ML IJ SOSY
PREFILLED_SYRINGE | INTRAMUSCULAR | Status: AC
  Administered 2018-05-18: 0.5 mg via INTRAVENOUS
  Filled 2018-05-18: qty 10

## 2018-05-18 MED ORDER — LEVOTHYROXINE SODIUM 50 MCG PO TABS
50.0000 ug | ORAL_TABLET | Freq: Every day | ORAL | Status: DC
  Administered 2018-05-19 – 2018-05-24 (×6): 50 ug via ORAL
  Filled 2018-05-18 (×6): qty 1

## 2018-05-18 MED ORDER — ATROPINE SULFATE 1 MG/10ML IJ SOSY
PREFILLED_SYRINGE | INTRAMUSCULAR | Status: AC
  Filled 2018-05-18: qty 10

## 2018-05-18 MED ORDER — ACETAMINOPHEN 160 MG/5ML PO SOLN
500.0000 mg | Freq: Three times a day (TID) | ORAL | Status: DC
  Administered 2018-05-19 – 2018-05-24 (×14): 500 mg via ORAL
  Filled 2018-05-18 (×16): qty 20.3

## 2018-05-18 MED ORDER — METOPROLOL TARTRATE 25 MG PO TABS: 25.0000 mg | ORAL_TABLET | Freq: Two times a day (BID) | ORAL | Status: DC

## 2018-05-18 MED ORDER — SODIUM BICARBONATE 8.4 % IV SOLN
50.0000 meq | Freq: Once | INTRAVENOUS | Status: AC
  Administered 2018-05-18: 50 meq via INTRAVENOUS

## 2018-05-18 MED ORDER — GLUCAGON HCL RDNA (DIAGNOSTIC) 1 MG IJ SOLR
1.0000 mg | Freq: Once | INTRAMUSCULAR | Status: AC
  Administered 2018-05-18: 1 mg via INTRAVENOUS

## 2018-05-18 MED ORDER — DILTIAZEM HCL 120 MG PO TABS: 120.0000 mg | ORAL_TABLET | Freq: Two times a day (BID) | ORAL | Status: DC

## 2018-05-18 MED ORDER — TRAMADOL HCL 50 MG PO TABS: 50.0000 mg | ORAL_TABLET | Freq: Four times a day (QID) | ORAL | 0 refills | Status: AC | PRN

## 2018-05-18 MED ORDER — ATROPINE SULFATE 1 MG/10ML IJ SOSY
0.5000 mg | PREFILLED_SYRINGE | Freq: Once | INTRAMUSCULAR | Status: AC
  Administered 2018-05-18: 0.5 mg via INTRAVENOUS

## 2018-05-18 MED ORDER — SODIUM BICARBONATE 8.4 % IV SOLN
INTRAVENOUS | Status: AC
  Administered 2018-05-18: 50 meq via INTRAVENOUS
  Filled 2018-05-18: qty 50

## 2018-05-18 MED ORDER — SODIUM CHLORIDE 0.9 % IV SOLN
INTRAVENOUS | Status: DC
  Administered 2018-05-18: 16:00:00 via INTRAVENOUS

## 2018-05-18 MED ORDER — SODIUM CHLORIDE 0.9 % IV SOLN
1.0000 g | Freq: Once | INTRAVENOUS | Status: AC
  Administered 2018-05-18: 1 g via INTRAVENOUS
  Filled 2018-05-18: qty 10

## 2018-05-18 NOTE — Discharge Summary (Addendum)
Physician Discharge Summary  Summer Jones XBJ:[id number] DOB: May 01, 1930 DOA: 05/08/2018  PCP: Crist Infante, MD  Admit date: 05/08/2018 Discharge date: 05/24/2018  Time spent: 45 minutes  Recommendations for Outpatient Follow-up:   1. Follow up general surgery in 2 weeks  Discharge Diagnoses:  Principal Problem:   Cholecystitis Active Problems:   Hyponatremia   Essential hypertension   Pancreatic cancer metastasized to liver [facility])   Palliative care by specialist   Pain of metastatic malignancy   Goals of care, counseling/discussion   Advance care planning   Malnutrition of moderate degree   Discharge Condition: Stable  Diet recommendation: Regular diet  Filed Weights   05/08/18 1335  Weight: 35.7 kg    History of present illness:  [name] history of gastric outlet obstruction-- gastro-jejunostomy on 11/2017,  history of CBD obstruction status post biliary stents,  pancreatic cancer with possible liver mets,  hypertension  Admit 9/22 RUQ abdominal pain, with associated distention, nausea, poor p.o. intake for the past 3 days.   In the ED, CT abdomen and ultrasound showed possible acute cholecystitis, small amount of ascites, with known pancreatic cancer with mets to the liver. Patient admitted for further management.   Patient also developed new onset A. fib in the hospital, started on Eliquis, metoprolol and Cardizem.  Patient developed bradycardia due to medications required transfer to stepdown unit.  Also found to be hyperkalemic.  Potassium supplementation was stopped.  At this time Cardizem has been started at low-dose per cardiology.  Hospital Course:   1. Bradycardia-resolved, secondary to combination of Cardizem 120 mg twice a day, metoprolol 25 mg twice a day.  Also patient was hyperkalemic with potassium of 6.3.  At this time, bradycardia has resolved.   2. New onset atrial fibrillation-patient is on anticoagulation with Eliquis, cardiology has changed  Cardizem to 60 mg 2 times a day also added Lopressor 12.5 mg p.o. once daily.  We will continue to monitor for next 24 hours.  If stable likely can be discharged in a.m.  3. Hyperkalemia-resolved, yesterday patient's potassium was 6.3, she was getting p.o. supplementation.  Received calcium gluconate, 1 ampule of sodium bicarbonate, at this time potassium is 4.4.    4. Acute cholecystitis status post percutaneous cholecystostomy on 05/09/2018 by IR-patient is afebrile, surgical deep wound showed Klebsiella and viridans streptococci.  Patient is currently on a p.o. Augmentin.  Earlier CT abdomen showed persistent gallbladder dilatation suggesting cholecystitis, general surgery was consulted, patient was noted to be a poor surgical candidate and status post percutaneous cholecystostomy drain.  Patient to follow-up with IR as outpatient.  Initially she was started on IV Zosyn which has been changed to Augmentin.  Patient completed 2 weeks of therapy stopped on 05/21/2018.  5. Metastatic pancreatic cancer with mets to liver-plan was to start palliative radiation 05/09/2018, follow up with oncology as outpatient.  6. Hypertension-amlodipine and lisinopril have been discontinued due to soft BP and also patient started on p.o. Cardizem.  7. Hyponatremia-slowly improving, today sodium is 136  8. Anasarca-improved, patient was started on  Lasix 20 mg IV every 12 hours.  Discussed with cardiology, will change Lasix to 20 mg p.o. 3 times a week.        Procedures:  None  Consultations:  Cardiology  General surgery  Discharge Exam: Vitals:   05/24/18 0815 05/24/18 1120  BP:    Pulse:    Resp:    Temp: 97.6 F (36.4 C) (!) 97.5 F (36.4 C)  SpO2:  General: Appears in no acute distress Cardiovascular: S1-S2, regular Respiratory: Clear to auscultation bilaterally  Discharge Instructions   Discharge Instructions    Diet - low sodium heart healthy   Complete by:  As  directed    Diet - low sodium heart healthy   Complete by:  As directed    Increase activity slowly   Complete by:  As directed    Increase activity slowly   Complete by:  As directed      Allergies as of 05/24/2018      Reactions   Sulfamethoxazole Nausea Only      Medication List    STOP taking these medications   amLODipine 10 MG tablet Commonly known as:  NORVASC   ibuprofen 100 MG chewable tablet Commonly known as:  ADVIL,MOTRIN   lisinopril 5 MG tablet Commonly known as:  PRINIVIL,ZESTRIL     TAKE these medications   apixaban 2.5 MG Tabs tablet Commonly known as:  ELIQUIS Take 1 tablet (2.5 mg total) by mouth 2 (two) times daily.   diltiazem 60 MG tablet Commonly known as:  CARDIZEM Take 1 tablet (60 mg total) by mouth every 12 (twelve) hours.   furosemide 20 MG tablet Commonly known as:  LASIX Take 1 tablet (20 mg total) by mouth every Monday, Wednesday, and Friday. Start taking on:  05/25/2018   hydrocortisone 2.5 % rectal cream Commonly known as:  ANUSOL-HC Place 1 application rectally 2 (two) times daily as needed for hemorrhoids or anal itching.   levothyroxine 25 MCG tablet Commonly known as:  SYNTHROID, LEVOTHROID Take 25 mcg by mouth daily before breakfast.   lipase/protease/amylase 12000 units Cpep capsule Commonly known as:  CREON Take 2 capsules (24,000 Units total) by mouth 3 (three) times daily before meals. What changed:  when to take this   LORazepam 0.5 MG tablet Commonly known as:  ATIVAN Take 0.5 tablets (0.25 mg total) by mouth every 6 (six) hours as needed for anxiety (or nausea).   metoprolol tartrate 25 MG tablet Commonly known as:  LOPRESSOR Take 0.5 tablets (12.5 mg total) by mouth daily. Start taking on:  05/25/2018   MULTIVITAMIN GUMMIES ADULT Chew Chew 1 tablet by mouth daily.   ondansetron 4 MG tablet Commonly known as:  ZOFRAN Take 1 tablet (4 mg total) by mouth every 6 (six) hours as needed for nausea.    pantoprazole sodium 40 mg/20 mL Pack Commonly known as:  PROTONIX Take 20 mLs (40 mg total) by mouth daily at 12 noon.   polyethylene glycol powder powder Commonly known as:  GLYCOLAX/MIRALAX Take 17 g by mouth daily as needed for mild constipation.   traMADol 50 MG tablet Commonly known as:  ULTRAM Take 1 tablet (50 mg total) by mouth every 6 (six) hours as needed for moderate pain.   Vitamin D3 400 units Caps Take 2 capsules by mouth daily.      Allergies  Allergen Reactions  . Sulfamethoxazole Nausea Only    Contact information for follow-up providers    Stark Klein, MD. Schedule an appointment as soon as possible for a visit in 2 week(s).   Specialty:  General Surgery Why:  for follow up regarding cholecystitis after drain placement Contact information: [place] [address] [phone]        Crist Infante, MD. Schedule an appointment as soon as possible for a visit in 1 month(s).   Specialty:  Internal Medicine Contact information: [address] [place] [address]  [phone]        Dixie Dials, MD. Schedule an appointment as soon as possible for a visit in 1 month(s).   Specialty:  Cardiology Contact information: [place] [address] [phone]            Contact information for after-discharge care    Destination    [facility] .   Service:  Skilled Nursing Contact information: [place] [phone]                   The results of significant diagnostics from this hospitalization (including imaging, microbiology, ancillary and laboratory) are listed below for reference.    Significant Diagnostic Studies: Dg Abd 1 View  Result Date: 05/18/2018 CLINICAL DATA:  Dyspnea and abdominal pain EXAM: ABDOMEN - 1 VIEW COMPARISON:  CT 05/08/2018 FINDINGS: Small pleural effusions and basilar consolidation. Biliary stent.  Placement of cholecystostomy tube in the right abdomen. Mild diffuse increased bowel gas. Postsurgical changes in the left abdomen. Scoliosis of the spine. IMPRESSION: 1. Placement of right-sided cholecystostomy tube. Mild diffuse increased bowel gas, possible mild ileus 2. Pleural effusions and basilar consolidations. Electronically Signed   By: Donavan Foil M.D.   On: 05/18/2018 17:05   Ct Abdomen Pelvis W Contrast  Result Date: 05/08/2018 CLINICAL DATA:  Pancreatic adenocarcinoma. Abdominal pain Distention. Gastrojejunostomy created 11/27/2016. EXAM: CT ABDOMEN AND PELVIS WITH CONTRAST TECHNIQUE: Multidetector CT imaging of the abdomen and pelvis was performed using the standard protocol following bolus administration of intravenous contrast. CONTRAST:  137mL ISOVUE-300 IOPAMIDOL (ISOVUE-300) INJECTION 61% COMPARISON:  03/29/2018 and previous FINDINGS: Lower chest: Trace pleural effusions right greater than left, new since previous. Minimal dependent atelectasis posteriorly in the right lung base. Moderate hiatal hernia. Hepatobiliary: Stable metallic biliary stent. No intrahepatic biliary ductal dilatation. Thick-walled gallbladder with mucosal enhancement. Small low-attenuation liver lesions. 8 mm low-attenuation lesion in the inferior right lobe, image 28/2, not present on prior studies. Ill-defined sub 7 mm low-attenuation focus in segment 8 image 12/2, not evident on prior studies. Pancreas: Dilatation of pancreatic duct if parenchymal atrophy. Little convincing change in the ill-defined soft tissue attenuation in the region the pancreatic head, margins indistinct. Spleen: Normal in size without focal abnormality. Adrenals/Urinary Tract: Normal adrenal glands. Unremarkable kidneys. Marked distention of the urinary bladder. Stomach/Bowel: Moderate hiatal hernia. Changes of gastrojejunostomy. Stomach is nondilated. Small bowel nondilated. Appendix not identified. Colon decompressed, unremarkable.  Vascular/Lymphatic: Mild scattered calcified atheromatous plaque. No aortic aneurysm. High-grade tapered narrowing near the portosplenic confluence. Reproductive: Uterus and bilateral adnexa are unremarkable. Other: New scattered mild perihepatic, perisplenic, and pelvic ascites. No free air. Musculoskeletal: Lumbar levoscoliosis with multilevel spondylitic change. Grade 1 anterolisthesis L5-S1 probably secondary to facet DJD. No fracture or worrisome bone lesion. IMPRESSION: 1. Small amount of abdominal and pelvic ascites, new since previous. 2. Marked distention of the urinary bladder 3. New small bilateral pleural effusions 4. Persistent gallbladder dilatation with thickened enhancing gallbladder wall, suggesting cholecystitis. 5. New low-attenuation liver lesions suggesting metastatic disease. Electronically Signed   By: Lucrezia Europe M.D.   On: 05/08/2018 16:37   Ir Perc Cholecystostomy  Result Date: 05/09/2018 INDICATION: Acute cholecystitis, poor operative candidate History of pancreatic cancer and remote history of percutaneous biliary stent placement, now with acute cholecystitis. Please perform image guided cholecystostomy tube for infection source control purposes. Note, prolonged conversations were held the patient and the patient's family regarding the high likelihood that this cholecystostomy tube will likely  be permanent given patient's multiple medical comorbidities. EXAM: ULTRASOUND AND FLUOROSCOPIC-GUIDED CHOLECYSTOSTOMY TUBE PLACEMENT COMPARISON:  CT abdomen pelvis-05/08/2018; right upper quadrant abdominal ultrasound-05/08/2018; percutaneous cholangiogram prior to removal of external biliary drainage catheter - 09/12/2015 MEDICATIONS: The patient is currently admitted to the hospital and on intravenous antibiotics. Antibiotics were administered within an appropriate time frame prior to skin puncture. ANESTHESIA/SEDATION: Moderate (conscious) sedation was employed during this procedure. A total  of Versed 1 mg and Fentanyl 50 mcg was administered intravenously. Moderate Sedation Time: 16 minutes. The patient's level of consciousness and vital signs were monitored continuously by radiology nursing throughout the procedure under my direct supervision. CONTRAST:  10 cc Isovue-300 - administered into the gallbladder fossa. FLUOROSCOPY TIME:  1 minutes 18 seconds (6.3 mGy) COMPLICATIONS: None immediate. PROCEDURE: Informed written consent was obtained from the patient and the patient's family after a discussion of the risks, benefits and alternatives to treatment. Questions regarding the procedure were encouraged and answered. A timeout was performed prior to the initiation of the procedure. The right upper abdominal quadrant was prepped and draped in the usual sterile fashion, and a sterile drape was applied covering the operative field. Maximum barrier sterile technique with sterile gowns and gloves were used for the procedure. A timeout was performed prior to the initiation of the procedure. Local anesthesia was provided with 1% lidocaine with epinephrine. Ultrasound scanning of the right upper quadrant demonstrates a markedly dilated gallbladder. Of note, the patient reported pain with ultrasound imaging over the gallbladder. Utilizing a transhepatic approach, a 22 gauge needle was advanced into the gallbladder under direct ultrasound guidance. An ultrasound image was saved for documentation purposes. Appropriate intraluminal puncture was confirmed with the efflux of bile and advancement of an 0.018 wire into the gallbladder lumen. The needle was exchanged for an [place] set. A small amount of contrast was injected to confirm appropriate intraluminal positioning. Over a Benson wire, a 19.2-French Cook cholecystomy tube was advanced into the gallbladder fossa, coiled and locked. Bile was aspirated and a small amount of contrast was injected as several post procedural spot radiographic images were obtained  in various obliquities. The catheter was secured to the skin with suture, connected to a drainage bag and a dressing was placed. The patient tolerated the procedure well without immediate post procedural complication. IMPRESSION: Successful ultrasound and fluoroscopic guided placement of a 10.2 French cholecystostomy tube. Electronically Signed   By: Sandi Mariscal M.D.   On: 05/09/2018 17:57   Dg Chest Port 1v Same Day  Result Date: 05/18/2018 CLINICAL DATA:  Dyspnea EXAM: PORTABLE CHEST 1 VIEW COMPARISON:  03/05/2010, CT 05/08/2018 FINDINGS: Small bila right greater than left pleural effusions. Bibasilar consolidations. Enlarged cardiomediastinal silhouette with vascular congestion and pulmonary edema. Bulky distortion of the right hilar contour, suspicious for a mass or adenopathy. Aortic atherosclerosis. No pneumothorax. Hiatal hernia. IMPRESSION: 1. Right greater than left pleural effusions with bibasilar consolidations. Cardiomegaly with vascular congestion and pulmonary edema 2. Bulky distortion of the right hilar region suspect for mass and/or adenopathy. Recommend contrast-enhanced CT to further evaluate. Electronically Signed   By: Donavan Foil M.D.   On: 05/18/2018 17:07   US Abdomen Limited Ruq  Result Date: 05/08/2018 CLINICAL DATA:  84 year old female with RIGHT UPPER quadrant abdominal pain and nausea for 3 days. History of pancreatic cancer. EXAM: ULTRASOUND ABDOMEN LIMITED RIGHT UPPER QUADRANT COMPARISON:  05/08/2018 CT FINDINGS: Gallbladder: Distended gallbladder with marked wall thickening noted. Gallbladder sludge identified. A positive sonographic Percell Miller sign is noted as well  as a small amount of pericholecystic fluid. Common bile duct: Diameter: 6 mm. Liver: No definite focal abnormalities. The abnormalities identified on CT are not well visualized sonographically. Portal vein is patent on color Doppler imaging with normal direction of blood flow towards the liver. Small amount of ascites  noted. IMPRESSION: Distended gallbladder with gallbladder wall thickening, pericholecystic fluid and positive sonographic Murphy sign compatible with acute cholecystitis. No biliary dilatation. Small amount of ascites. Electronically Signed   By: Margarette Canada M.D.   On: 05/08/2018 17:48    Microbiology: Recent Results (from the past 240 hour(s))  Aerobic Culture (superficial specimen)     Status: Abnormal   Collection Time: 05/20/18 11:36 AM  Result Value Ref Range Status   Specimen Description   Final    BILE Performed at [facility] [address] [place], [place] [address]    Special Requests   Final    NONE Performed at [facility], [place] [address] [place], [place] [address]    Gram Stain   Final    RARE WBC PRESENT, PREDOMINANTLY PMN RARE GRAM VARIABLE ROD Performed at [facility], [place] [address], [place], [place] [address]    Culture MULTIPLE ORGANISMS PRESENT, NONE PREDOMINANT (A)  Final   Report Status 05/22/2018 FINAL  Final     Labs: Basic Metabolic Panel: Recent Labs  Lab 05/18/18 1419  05/19/18 0332 05/20/18 0634 05/21/18 1344 05/22/18 0349 05/24/18 0354  NA 125*   < > 129* 128* 132* 129* 136  K 6.3*   < > 5.3* 4.4 4.3 4.3 3.9  CL 90*   < > 95* 91* 90* 92* 94*  CO2 27   < > 23 29 34* 31 34*  GLUCOSE 189*   < > 108* 129* 147* 128* 118*  BUN 10   < > 10 9 8 8 9   CREATININE 0.56   < > 0.44 0.40* 0.42* 0.42* 0.46  CALCIUM 8.3*   < > 8.3* 8.3* 8.3* 7.9* 8.3*  MG 1.8  --   --   --  1.9  --   --    < > = values in this interval not displayed.   Liver Function Tests: Recent Labs  Lab 05/19/18 0332  AST 35  ALT 23  ALKPHOS 199*  BILITOT 0.5  PROT 5.5*  ALBUMIN 2.2*   No results for input(s): LIPASE, AMYLASE in the last 168 hours. No results for input(s): AMMONIA in the last 168 hours. CBC: Recent Labs  Lab 05/19/18 0332  WBC 16.6*  HGB 10.2*  HCT 31.3*  MCV 89.9  PLT 409*        Signed:  Oswald Hillock MD.  [facility] 05/24/2018, 12:20 PM

## 2018-05-18 NOTE — Care Management Note (Signed)
Case Management Note  Patient Details  Name: Summer Jones MRN: [id number] Date of Birth: [birth year]  Subjective/Objective:                  Discharged to [facility]  Action/Plan: No cm needs present.  Expected Discharge Date:  05/18/18               Expected Discharge Plan:  Skilled Nursing Facility  In-House Referral:  Clinical Social Work  Discharge planning Services  CM Consult  Post Acute Care Choice:    Choice offered to:     DME Arranged:    DME Agency:     HH Arranged:    [place] Agency:     Status of Service:  Completed, signed off  If discussed at [employer], dates discussed:    Additional Comments:  Leeroy Cha, RN 05/18/2018, 1:15 PM

## 2018-05-18 NOTE — Progress Notes (Signed)
MD notified due to acute change in patient status. Vital signs and labs are listed below.  MD notified(1st page) Time of 1st page:  1333 Responding MD:  Darrick Meigs Time MD responded: [id number] MD response: MD stated aware  Vital Signs Vitals:   05/17/18 1415 05/17/18 2107 05/18/18 1246 05/18/18 1333  BP:  135/63  (!) 103/55  Pulse: 60 (!) 59  (!) 35  Resp: 18 18  (!) 28  Temp:  97.7 F (36.5 C)  (!) 97.5 F (36.4 C)  TempSrc:  Oral    SpO2:  96% (!) 89% 98%  Weight:      Height:         Lab Results WBC  Date/Time Value Ref Range Status  05/16/2018 05:39 AM 14.8 (H) 4.0 - 10.5 K/uL Final  05/15/2018 06:03 AM 13.5 (H) 4.0 - 10.5 K/uL Final  05/14/2018 03:20 AM 12.7 (H) 4.0 - 10.5 K/uL Final   NEUT%  Date/Time Value Ref Range Status  08/16/2017 02:49 PM 62.8 38.4 - 76.8 % Final  06/28/2017 02:27 PM 65.5 38.4 - 76.8 % Final   Neutrophils Relative %  Date/Time Value Ref Range Status  05/16/2018 05:39 AM 83 % Final  05/15/2018 06:03 AM 80 % Final  05/14/2018 03:20 AM 84 % Final   No results found for: PCO2ART Lactic Acid, Venous  Date/Time Value Ref Range Status  05/08/2018 02:27 PM 1.18 0.5 - 1.9 mmol/L Final   No results found for: PCO2VEN  Pt called out stating she couldn't breath and was anxious about transporting to [facility] today. Pt placed on tele per MD orders. Will continue to monitor.   Bobby Rumpf, RN 05/18/2018, 1:38 PM

## 2018-05-18 NOTE — Progress Notes (Signed)
Report called to [facility]. 

## 2018-05-18 NOTE — Progress Notes (Signed)
RRN and MD paged to assess pt and current vitals. Pt denies chest pain and states its hard to breath. Pt is sating at 96% on 2L via nasal cannula. Fluid bolus started. Pt to transfer to step down unit for closer monitoring.

## 2018-05-18 NOTE — Clinical Social Work Placement (Signed)
Patient discharging to [facility] [phone]. CSW confirmed bed availability and faxed appropriate documents.   [facility] will be called for transport at approximately 1:30 pm per son's request. Son, Kemara Quigley, notified of transfer.  RN call report to: [phone]  No more CSW needs. Signing off.  CLINICAL SOCIAL WORK PLACEMENT  NOTE  Date:  05/18/2018  Patient Details  Name: Summer Jones MRN: [id number] Date of Birth: 05-18-1930  Clinical Social Work is seeking post-discharge placement for this patient at the [place] level of care (*CSW will initial, date and re-position this form in  chart as items are completed):  Yes   Patient/family provided with [facility] Work Department's list of facilities offering this level of care within the geographic area requested by the patient (or if unable, by the patient's family).  Yes   Patient/family informed of their freedom to choose among providers that offer the needed level of care, that participate in Medicare, Medicaid or managed care program needed by the patient, have an available bed and are willing to accept the patient.  Yes   Patient/family informed of [facility]'s ownership interest in [facility] and [facility], as well as of the fact that they are under no obligation to receive care at these facilities.  PASRR submitted to EDS on 05/12/18     PASRR number received on 05/12/18     Existing PASRR number confirmed on       FL2 transmitted to all facilities in geographic area requested by pt/family on 05/12/18     FL2 transmitted to all facilities within larger geographic area on 05/12/18     Patient informed that his/her managed care company has contracts with or will negotiate with certain facilities, including the following:        Yes   Patient/family informed of bed offers received.  Patient chooses bed at [facility]     Physician recommends and patient chooses bed at       Patient to be transferred to [facility] on 05/18/18.  Patient to be transferred to facility by [facility]     Patient family notified on 05/18/18 of transfer.  Name of family member notified:  Summer Jones: [phone]     PHYSICIAN       Additional Comment:    _______________________________________________ Pricilla Holm, LCSW 05/18/2018, 12:57 PM

## 2018-05-18 NOTE — Consult Note (Signed)
Ref: Crist Infante, MD  Subjective:  Patient resting comfortably. Heart rate in 40's. 2nd dose of atropine 0.5 mg. Given. 50 meq. Sodium bicarb is also being given through IV saline for hyperkalemia.  Objective:  Vital Signs in the last 24 hours: Temp:  [97.5 F (36.4 C)-98.2 F (36.8 C)] 98.2 F (36.8 C) (10/02 1446) Pulse Rate:  [31-59] 31 (10/02 1600) Cardiac Rhythm: Junctional rhythm (10/02 1600) Resp:  [11-35] 11 (10/02 1600) BP: (62-135)/(41-67) 121/67 (10/02 1600) SpO2:  [89 %-98 %] 97 % (10/02 1600)  Physical Exam: BP Readings from Last 1 Encounters:  05/18/18 121/67     Wt Readings from Last 1 Encounters:  05/08/18 35.7 kg    Weight change:  Body mass index is 15.66 kg/m. HEENT: [place]/AT, Eyes-Blue, PERL, EOMI, Conjunctiva-Pale, Sclera-Non-icteric Neck: No JVD, No bruit, Trachea midline. Lungs:  Clear, Bilateral. Cardiac:  Regular rhythm, normal S1 and S2, no S3. II/VI systolic murmur. Abdomen:  Soft, non-tender. BS present. Extremities:  1+ edema present. No cyanosis. No clubbing. CNS: AxOx3, Cranial nerves grossly intact, moves all 4 extremities.  Skin: Warm and dry.   Intake/Output from previous day: 10/01 0701 - 10/02 0700 In: 533.6 [P.O.:480; I.V.:38.6] Out: 50 [Drains:50]    Lab Results: BMET    Component Value Date/Time   NA 125 (L) 05/18/2018 1419   NA 137 05/14/2018 0320   NA 138 05/14/2018 0320   NA 137 08/16/2017 1449   NA 135 (L) 06/28/2017 1427   K 6.3 (HH) 05/18/2018 1419   K 3.7 05/14/2018 0320   K 3.8 05/14/2018 0320   K 4.0 08/16/2017 1449   K 4.0 06/28/2017 1427   CL 90 (L) 05/18/2018 1419   CL 105 05/14/2018 0320   CL 106 05/14/2018 0320   CO2 27 05/18/2018 1419   CO2 25 05/14/2018 0320   CO2 25 05/14/2018 0320   CO2 27 08/16/2017 1449   CO2 25 06/28/2017 1427   GLUCOSE 189 (H) 05/18/2018 1419   GLUCOSE 148 (H) 05/14/2018 0320   GLUCOSE 149 (H) 05/14/2018 0320   GLUCOSE 114 08/16/2017 1449   GLUCOSE 103 06/28/2017 1427   BUN  10 05/18/2018 1419   BUN 9 05/14/2018 0320   BUN 9 05/14/2018 0320   BUN 8.6 08/16/2017 1449   BUN 8.7 06/28/2017 1427   CREATININE 0.56 05/18/2018 1419   CREATININE 0.50 05/14/2018 0320   CREATININE 0.40 (L) 05/14/2018 0320   CREATININE 0.71 10/19/2017 1142   CREATININE 0.8 08/16/2017 1449   CREATININE 0.7 06/28/2017 1427   CALCIUM 8.3 (L) 05/18/2018 1419   CALCIUM 8.5 (L) 05/14/2018 0320   CALCIUM 8.5 (L) 05/14/2018 0320   CALCIUM 9.5 08/16/2017 1449   CALCIUM 9.4 06/28/2017 1427   GFRNONAA >60 05/18/2018 1419   GFRNONAA >60 05/14/2018 0320   GFRNONAA >60 05/14/2018 0320   GFRNONAA >60 10/19/2017 1142   GFRAA >60 05/18/2018 1419   GFRAA >60 05/14/2018 0320   GFRAA >60 05/14/2018 0320   GFRAA >60 10/19/2017 1142   CBC    Component Value Date/Time   WBC 14.8 (H) 05/16/2018 0539   RBC 3.47 (L) 05/16/2018 0539   HGB 10.1 (L) 05/16/2018 0539   HGB 12.1 10/19/2017 1142   HGB 12.4 08/16/2017 1449   HCT 30.1 (L) 05/16/2018 0539   HCT 38.1 08/16/2017 1449   PLT 505 (H) 05/16/2018 0539   PLT 284 10/19/2017 1142   PLT 258 08/16/2017 1449   MCV 86.7 05/16/2018 0539   MCV 93.3 08/16/2017  1449   MCH 29.1 05/16/2018 0539   MCHC 33.6 05/16/2018 0539   RDW 15.1 05/16/2018 0539   RDW 13.8 08/16/2017 1449   LYMPHSABS 1.1 05/16/2018 0539   LYMPHSABS 0.9 08/16/2017 1449   MONOABS 1.2 (H) 05/16/2018 0539   MONOABS 0.5 08/16/2017 1449   EOSABS 0.2 05/16/2018 0539   EOSABS 0.1 08/16/2017 1449   BASOSABS 0.0 05/16/2018 0539   BASOSABS 0.0 08/16/2017 1449   HEPATIC Function Panel Recent Labs    03/22/18 1614 05/08/18 1420 05/09/18 0420  PROT 7.4 5.9* 5.3*   HEMOGLOBIN A1C No components found for: HGA1C,  MPG CARDIAC ENZYMES Lab Results  Component Value Date   TROPONINI 0.04 ([place]) 05/18/2018   BNP No results for input(s): PROBNP in the last 8760 hours. TSH Recent Labs    05/13/18 0314  TSH 7.152*   CHOLESTEROL No results for input(s): CHOL in the last 8760  hours.  Scheduled Meds: . acetaminophen (TYLENOL) oral liquid 160 mg/5 mL  650 mg Oral Q6H  . amoxicillin-clavulanate  500 mg Oral BID  . apixaban  2.5 mg Oral BID  . atropine  0.5 mg Intravenous Once  . atropine      . atropine      . docusate sodium  100 mg Oral Daily  . feeding supplement (ENSURE ENLIVE)  237 mL Oral BID BM  . glucagon (human recombinant)      . levothyroxine  25 mcg Oral QAC breakfast  . polyethylene glycol  17 g Oral Daily  . sodium bicarbonate      . sodium bicarbonate  50 mEq Intravenous Once  . sodium chloride flush  5 mL Intracatheter Q8H   Continuous Infusions: . sodium chloride Stopped (05/09/18 1709)  . sodium chloride Stopped (05/15/18 1751)  . sodium chloride 100 mL/hr at 05/18/18 1619  . calcium gluconate 1 g (05/18/18 1655)  . glucagon (GLUCAGEN)  IVPB (bolus for beta blocker/calcium channel bocker overdose)     PRN Meds:.sodium chloride, ALPRAZolam, hydrocortisone, morphine injection, ondansetron **OR** ondansetron (ZOFRAN) IV  Assessment/Plan: Junctional rhythm from B-blocker and calcium channel blocker adverse effect. Moderate MR and TR Dysphagia S/P acute cholecystitis S/P perc cholecystostomy Severe protein calorie malnutrition Hypothyroidism  Additional glucagon to improve heart rate. May use external pacemaker if needed.   LOS: 10 days    Dixie Dials  MD  05/18/2018, 5:47 PM

## 2018-05-18 NOTE — Progress Notes (Addendum)
Called by RN that patient's heart rate was low in 30s, also she was hyperventilating. Went and saw the patient.  Denies chest pain or shortness of breath.  Feels anxious. On exam-lungs clear to auscultation bilaterally                  Heart-S1-S2, regular O2 sats 93% on room air  Assessment Bradycardia Panic attack/anxiety  Hold metoprolol, Cardizem Ativan 0.25 mg p.o. x1  Called and discussed with patient's cardiologist Dr. Doylene Canard, who recommends to hold beta-blocker and Cardizem.  Continue monitoring on telemetry.  Will hold discharge today.  Critical care time spent 35 minutes

## 2018-05-18 NOTE — Consult Note (Signed)
Heart rate now in 50's and has sinus rhythm. Will decrease IV fluids to 50 cc/hr. Will introduce small dose diltiazem when heart rate over 80 bpm. Patient remains alert.  Dixie Dials, MD 05/18/2018, 11:43 PM

## 2018-05-18 NOTE — Consult Note (Signed)
Ref: Crist Infante, MD   Subjective:  IV glucagon 1 mg. and IV atropine 0.5 mg. With improvement in heart rate to 50-60 bpm. Patient is awake and feels better now than when heart rate was down to 20-30's/min.  B-blocker and Calcium channel blocker held for now.  Objective:  Vital Signs in the last 24 hours: Temp:  [97.5 F (36.4 C)-98.2 F (36.8 C)] 98.2 F (36.8 C) (10/02 1446) Pulse Rate:  [33-59] 34 (10/02 1545) Cardiac Rhythm: Junctional rhythm (10/02 1600) Resp:  [18-35] 29 (10/02 1545) BP: (62-135)/(41-64) 116/41 (10/02 1545) SpO2:  [89 %-98 %] 97 % (10/02 1545)  Physical Exam: BP Readings from Last 1 Encounters:  05/18/18 (!) 116/41     Wt Readings from Last 1 Encounters:  05/08/18 35.7 kg    Weight change:  Body mass index is 15.66 kg/m. HEENT: [place]/AT, Eyes-Blue, PERL, EOMI, Conjunctiva-Pink, Sclera-Non-icteric Neck: No JVD, No bruit, Trachea midline. Lungs:  Clear, Bilateral. Cardiac:  Slow, Regular rhythm, normal S1 and S2, no S3. II/VI systolic murmur. Abdomen:  Soft, non-tender. BS present. Extremities:  1+  edema present. No cyanosis. No clubbing. CNS: AxOx3, Cranial nerves grossly intact, moves all 4 extremities.  Skin: Warm and dry.   Intake/Output from previous day: 10/01 0701 - 10/02 0700 In: 533.6 [P.O.:480; I.V.:38.6] Out: 50 [Drains:50]    Lab Results: BMET    Component Value Date/Time   NA 125 (L) 05/18/2018 1419   NA 137 05/14/2018 0320   NA 138 05/14/2018 0320   NA 137 08/16/2017 1449   NA 135 (L) 06/28/2017 1427   K 6.3 (HH) 05/18/2018 1419   K 3.7 05/14/2018 0320   K 3.8 05/14/2018 0320   K 4.0 08/16/2017 1449   K 4.0 06/28/2017 1427   CL 90 (L) 05/18/2018 1419   CL 105 05/14/2018 0320   CL 106 05/14/2018 0320   CO2 27 05/18/2018 1419   CO2 25 05/14/2018 0320   CO2 25 05/14/2018 0320   CO2 27 08/16/2017 1449   CO2 25 06/28/2017 1427   GLUCOSE 189 (H) 05/18/2018 1419   GLUCOSE 148 (H) 05/14/2018 0320   GLUCOSE 149 (H)  05/14/2018 0320   GLUCOSE 114 08/16/2017 1449   GLUCOSE 103 06/28/2017 1427   BUN 10 05/18/2018 1419   BUN 9 05/14/2018 0320   BUN 9 05/14/2018 0320   BUN 8.6 08/16/2017 1449   BUN 8.7 06/28/2017 1427   CREATININE 0.56 05/18/2018 1419   CREATININE 0.50 05/14/2018 0320   CREATININE 0.40 (L) 05/14/2018 0320   CREATININE 0.71 10/19/2017 1142   CREATININE 0.8 08/16/2017 1449   CREATININE 0.7 06/28/2017 1427   CALCIUM 8.3 (L) 05/18/2018 1419   CALCIUM 8.5 (L) 05/14/2018 0320   CALCIUM 8.5 (L) 05/14/2018 0320   CALCIUM 9.5 08/16/2017 1449   CALCIUM 9.4 06/28/2017 1427   GFRNONAA >60 05/18/2018 1419   GFRNONAA >60 05/14/2018 0320   GFRNONAA >60 05/14/2018 0320   GFRNONAA >60 10/19/2017 1142   GFRAA >60 05/18/2018 1419   GFRAA >60 05/14/2018 0320   GFRAA >60 05/14/2018 0320   GFRAA >60 10/19/2017 1142   CBC    Component Value Date/Time   WBC 14.8 (H) 05/16/2018 0539   RBC 3.47 (L) 05/16/2018 0539   HGB 10.1 (L) 05/16/2018 0539   HGB 12.1 10/19/2017 1142   HGB 12.4 08/16/2017 1449   HCT 30.1 (L) 05/16/2018 0539   HCT 38.1 08/16/2017 1449   PLT 505 (H) 05/16/2018 0539   PLT 284 10/19/2017  1142   PLT 258 08/16/2017 1449   MCV 86.7 05/16/2018 0539   MCV 93.3 08/16/2017 1449   MCH 29.1 05/16/2018 0539   MCHC 33.6 05/16/2018 0539   RDW 15.1 05/16/2018 0539   RDW 13.8 08/16/2017 1449   LYMPHSABS 1.1 05/16/2018 0539   LYMPHSABS 0.9 08/16/2017 1449   MONOABS 1.2 (H) 05/16/2018 0539   MONOABS 0.5 08/16/2017 1449   EOSABS 0.2 05/16/2018 0539   EOSABS 0.1 08/16/2017 1449   BASOSABS 0.0 05/16/2018 0539   BASOSABS 0.0 08/16/2017 1449   HEPATIC Function Panel Recent Labs    03/22/18 1614 05/08/18 1420 05/09/18 0420  PROT 7.4 5.9* 5.3*   HEMOGLOBIN A1C No components found for: HGA1C,  MPG CARDIAC ENZYMES Lab Results  Component Value Date   TROPONINI 0.04 ([place]) 05/18/2018   BNP No results for input(s): PROBNP in the last 8760 hours. TSH Recent Labs    05/13/18 0314   TSH 7.152*   CHOLESTEROL No results for input(s): CHOL in the last 8760 hours.  Scheduled Meds: . acetaminophen (TYLENOL) oral liquid 160 mg/5 mL  650 mg Oral Q6H  . amoxicillin-clavulanate  500 mg Oral BID  . apixaban  2.5 mg Oral BID  . atropine  0.5 mg Intravenous Once  . atropine      . atropine      . docusate sodium  100 mg Oral Daily  . feeding supplement (ENSURE ENLIVE)  237 mL Oral BID BM  . glucagon (human recombinant)      . levothyroxine  25 mcg Oral QAC breakfast  . polyethylene glycol  17 g Oral Daily  . sodium chloride flush  5 mL Intracatheter Q8H   Continuous Infusions: . sodium chloride Stopped (05/09/18 1709)  . sodium chloride Stopped (05/15/18 1751)  . sodium chloride     PRN Meds:.sodium chloride, ALPRAZolam, hydrocortisone, morphine injection, ondansetron **OR** ondansetron (ZOFRAN) IV  Assessment/Plan: Paroxysmal atrial fibrillation now with high grade AV block from medications. Moderate MR and TR Dysphagia S/P acute cholecystitis S/P perc cholecystostomy Severe protein calorie malnutrition Hypothyroidism  Use atropine and glucagon +/- calcium IV as needed if heart rate less than 45 bpm. Resume low dose diltiazem from tomorrow if stable.   LOS: 10 days    Dixie Dials  MD  05/18/2018, 4:07 PM

## 2018-05-19 ENCOUNTER — Ambulatory Visit: Payer: Medicare Other

## 2018-05-19 ENCOUNTER — Inpatient Hospital Stay: Payer: Medicare Other

## 2018-05-19 ENCOUNTER — Inpatient Hospital Stay: Payer: Medicare Other | Admitting: Genetics

## 2018-05-19 LAB — COMPREHENSIVE METABOLIC PANEL
ALT: 23 U/L (ref 0–44)
ANION GAP: 11 (ref 5–15)
AST: 35 U/L (ref 15–41)
Albumin: 2.2 g/dL — ABNORMAL LOW (ref 3.5–5.0)
Alkaline Phosphatase: 199 U/L — ABNORMAL HIGH (ref 38–126)
BILIRUBIN TOTAL: 0.5 mg/dL (ref 0.3–1.2)
BUN: 10 mg/dL (ref 8–23)
CALCIUM: 8.3 mg/dL — AB (ref 8.9–10.3)
CO2: 23 mmol/L (ref 22–32)
Chloride: 95 mmol/L — ABNORMAL LOW (ref 98–111)
Creatinine, Ser: 0.44 mg/dL (ref 0.44–1.00)
Glucose, Bld: 108 mg/dL — ABNORMAL HIGH (ref 70–99)
Potassium: 5.3 mmol/L — ABNORMAL HIGH (ref 3.5–5.1)
Sodium: 129 mmol/L — ABNORMAL LOW (ref 135–145)
TOTAL PROTEIN: 5.5 g/dL — AB (ref 6.5–8.1)

## 2018-05-19 LAB — CBC
HEMATOCRIT: 31.3 % — AB (ref 36.0–46.0)
Hemoglobin: 10.2 g/dL — ABNORMAL LOW (ref 12.0–15.0)
MCH: 29.3 pg (ref 26.0–34.0)
MCHC: 32.6 g/dL (ref 30.0–36.0)
MCV: 89.9 fL (ref 78.0–100.0)
Platelets: 409 10*3/uL — ABNORMAL HIGH (ref 150–400)
RBC: 3.48 MIL/uL — ABNORMAL LOW (ref 3.87–5.11)
RDW: 16.7 % — AB (ref 11.5–15.5)
WBC: 16.6 10*3/uL — ABNORMAL HIGH (ref 4.0–10.5)

## 2018-05-19 LAB — TROPONIN I

## 2018-05-19 MED ORDER — DILTIAZEM HCL 30 MG PO TABS
30.0000 mg | ORAL_TABLET | Freq: Two times a day (BID) | ORAL | Status: DC
  Administered 2018-05-19 (×2): 30 mg via ORAL
  Filled 2018-05-19 (×2): qty 1

## 2018-05-19 NOTE — Progress Notes (Signed)
[facility]  PROGRESS NOTE  Summer Jones YFV:[id number] DOB: 1930-03-20 DOA: 05/08/2018 PCP: Crist Infante, MD   Brief HPI:    67 WF history of gastric outlet obstruction-- gastro-jejunostomy on 11/2017,  history of CBD obstruction status post biliary stents,  pancreatic cancer with possible liver mets,  hypertension  Admit 9/22 RUQ abdominal pain, with associated distention, nausea, poor p.o. intake for the past 3 days.   In the ED, CT abdomen and ultrasound showed possible acute cholecystitis, small amount of ascites, with known pancreatic cancer with mets to the liver. Patient admitted for further management.  Patient also developed new onset A. fib in the hospital, started on Eliquis, metoprolol and Cardizem.  Patient developed bradycardia due to medications required transfer to stepdown unit.  Also found to be hyperkalemic.  Potassium supplementation was stopped.  At this time Cardizem has been started at low-dose per cardiology.  Subjective   Patient seen and examined, heart rate has improved.  Denies shortness of breath or chest pain.   Assessment/Plan:     1. Bradycardia-secondary to combination of Cardizem 120 mg twice a day, metoprolol 25 mg twice a day.  Also patient was hyperkalemic with potassium of 6.3.  At this time, bradycardia has resolved.   2. New onset atrial fibrillation-patient is on anticoagulation with Eliquis, low-dose Cardizem 30 mg p.o. every 12 hours started per cardiology.   3. Hyperkalemia-yesterday patient's potassium was 6.3, she was getting p.o. supplementation.  Received calcium gluconate, 1 ampoule of sodium bicarbonate, at this time potassium was 5.3.  Check BMP in a.m.  4. Acute cholecystitis status post percutaneous cholecystostomy on 05/09/2018 by IR-patient is afebrile, surgical deep wound showed Klebsiella and viridans streptococci.  Patient is currently on a p.o. Augmentin.  Earlier CT abdomen showed persistent gallbladder dilatation  suggesting cholecystitis, general surgery was consulted, patient was noted to be a poor surgical candidate and status post percutaneous cholecystostomy drain.  Patient to follow-up with IR as outpatient.  Initially she was started on IV Zosyn which has been changed to Augmentin.  For total 2 weeks of therapy stop date on 05/21/2018.  5. Metastatic pancreatic cancer with mets to liver-plan was to start palliative radiation 05/09/2018, follow up with oncology as outpatient.  6. Hypertension-amlodipine and lisinopril have been discontinued due to soft BP and also patient started on p.o. Cardizem.  7. Hyponatremia-slowly improving, today sodium is 129.     CBG: Recent Labs  Lab 05/18/18 1943  GLUCAP 156*    CBC: Recent Labs  Lab 05/13/18 0314 05/14/18 0320 05/15/18 0603 05/16/18 0539 05/19/18 0332  WBC 12.9* 12.7* 13.5* 14.8* 16.6*  NEUTROABS 10.8* 10.6* 10.7* 12.3*  --   HGB 9.6* 10.0* 10.5* 10.1* 10.2*  HCT 27.4* 28.6* 31.2* 30.1* 31.3*  MCV 83.3 83.9 85.7 86.7 89.9  PLT 400 406* 507* 505* 409*    Basic Metabolic Panel: Recent Labs  Lab 05/13/18 0314 05/14/18 0320 05/18/18 1419 05/18/18 2126 05/19/18 0332  NA 138 138  137 125* 127* 129*  K 3.0* 3.8  3.7 6.3* 5.9* 5.3*  CL 106 106  105 90* 92* 95*  CO2 21* 25  25 27 27 23   GLUCOSE 144* 149*  148* 189* 153* 108*  BUN 8 9  9 10 12 10   CREATININE 0.48 0.40*  0.50 0.56 0.53 0.44  CALCIUM 8.1* 8.5*  8.5* 8.3* 8.5* 8.3*  MG 1.7 1.7 1.8  --   --   PHOS  --  2.5  --   --   --  DVT prophylaxis: Apixaban  Code Status: DNR  Family Communication: Discussed with patient's husband at bedside  Disposition Plan: likely skilled nursing facility in next 24 to 48 hours   Consultants:  Cardiology  General surgery  Procedures:  None   Antibiotics:   Anti-infectives (From admission, onward)   Start     Dose/Rate Route Frequency Ordered Stop   05/18/18 0000  amoxicillin-clavulanate (AUGMENTIN) 500-125 MG  tablet     500 mg Oral 2 times daily 05/18/18 1228 05/21/18 2359   05/13/18 2200  amoxicillin-clavulanate (AUGMENTIN) 500-125 MG per tablet 500 mg     500 mg Oral 2 times daily 05/13/18 1556     05/12/18 1415  amoxicillin-clavulanate (AUGMENTIN) 875-125 MG per tablet 1 tablet  Status:  Discontinued     1 tablet Oral Every 12 hours 05/12/18 1402 05/13/18 1556   05/09/18 0200  piperacillin-tazobactam (ZOSYN) IVPB 3.375 g  Status:  Discontinued     3.375 g 12.5 mL/hr over 240 Minutes Intravenous Every 8 hours 05/08/18 1915 05/12/18 1402   05/08/18 1845  piperacillin-tazobactam (ZOSYN) IVPB 3.375 g     3.375 g 100 mL/hr over 30 Minutes Intravenous  Once 05/08/18 1833 05/08/18 1908       Objective   Vitals:   05/19/18 0800 05/19/18 0900 05/19/18 1000 05/19/18 1157  BP: (!) 123/47 (!) 118/44 (!) 130/58   Pulse: 75 68 70   Resp:  17    Temp:    (!) 96.5 F (35.8 C)  TempSrc:    Oral  SpO2: 97% 100% 99%   Weight:      Height:        Intake/Output Summary (Last 24 hours) at 05/19/2018 1414 Last data filed at 05/19/2018 1000 Gross per 24 hour  Intake 1224.75 ml  Output 130 ml  Net 1094.75 ml   Filed Weights   05/08/18 1335  Weight: 35.7 kg     Physical Examination:    General: Appears in no acute distress  Cardiovascular: S1-S2, regular  Respiratory: Clear to auscultation bilaterally  Abdomen: Soft, nontender, no organomegaly  Extremities: No edema in the lower extremities noted  Neurologic: Alert, oriented x3, no focal deficit noted     Data Reviewed: I have personally reviewed following labs and imaging studies   Recent Results (from the past 240 hour(s))  Aerobic/Anaerobic Culture (surgical/deep wound)     Status: None   Collection Time: 05/09/18  5:53 PM  Result Value Ref Range Status   Specimen Description   Final    WOUND Performed at [facility], [address], [place], [place] [address]    Special Requests   Final     Normal Performed at [facility], [place] [address], [place], [place] [address]    Gram Stain   Final    MODERATE WBC PRESENT, PREDOMINANTLY PMN ABUNDANT GRAM POSITIVE COCCI ABUNDANT GRAM NEGATIVE RODS ABUNDANT GRAM POSITIVE RODS Performed at [facility], [place] [address], [place], [place] [address]    Culture   Final    FEW KLEBSIELLA OXYTOCA ABUNDANT VIRIDANS STREPTOCOCCUS CULTURE REINCUBATED FOR BETTER GROWTH MIXED ANAEROBIC FLORA PRESENT.  CALL LAB IF FURTHER IID REQUIRED.    Report Status 05/13/2018 FINAL  Final   Organism ID, Bacteria KLEBSIELLA OXYTOCA  Final   Organism ID, Bacteria VIRIDANS STREPTOCOCCUS  Final      Susceptibility   Klebsiella oxytoca - MIC*    AMPICILLIN RESISTANT Resistant     CEFAZOLIN <=4 SENSITIVE Sensitive  CEFEPIME <=1 SENSITIVE Sensitive     CEFTAZIDIME <=1 SENSITIVE Sensitive     CEFTRIAXONE <=1 SENSITIVE Sensitive     CIPROFLOXACIN <=0.25 SENSITIVE Sensitive     GENTAMICIN <=1 SENSITIVE Sensitive     IMIPENEM <=0.25 SENSITIVE Sensitive     TRIMETH/SULFA <=20 SENSITIVE Sensitive     AMPICILLIN/SULBACTAM <=2 SENSITIVE Sensitive     PIP/TAZO <=4 SENSITIVE Sensitive     Extended ESBL NEGATIVE Sensitive     * FEW KLEBSIELLA OXYTOCA   Viridans streptococcus - MIC*    ERYTHROMYCIN <=0.12 SENSITIVE Sensitive     LEVOFLOXACIN 0.5 SENSITIVE Sensitive     VANCOMYCIN 0.5 SENSITIVE Sensitive     * ABUNDANT VIRIDANS STREPTOCOCCUS  MRSA PCR Screening     Status: None   Collection Time: 05/12/18  7:19 PM  Result Value Ref Range Status   MRSA by PCR NEGATIVE NEGATIVE Final    Comment:        The GeneXpert MRSA Assay (FDA approved for NASAL specimens only), is one component of a comprehensive MRSA colonization surveillance program. It is not intended to diagnose MRSA infection nor to guide or monitor treatment for MRSA infections. Performed at [facility], [place] [address], [place], [place] [address]       Liver Function Tests: Recent Labs  Lab 05/14/18 0320 05/19/18 0332  AST  --  35  ALT  --  23  ALKPHOS  --  199*  BILITOT  --  0.5  PROT  --  5.5*  ALBUMIN 2.4* 2.2*   No results for input(s): LIPASE, AMYLASE in the last 168 hours. No results for input(s): AMMONIA in the last 168 hours.  Cardiac Enzymes: Recent Labs  Lab 05/18/18 1419 05/18/18 2126 05/19/18 0332  TROPONINI 0.04* 0.03* <0.03   BNP (last 3 results) No results for input(s): BNP in the last 8760 hours.  ProBNP (last 3 results) No results for input(s): PROBNP in the last 8760 hours.    Studies: Dg Abd 1 View  Result Date: 05/18/2018 CLINICAL DATA:  Dyspnea and abdominal pain EXAM: ABDOMEN - 1 VIEW COMPARISON:  CT 05/08/2018 FINDINGS: Small pleural effusions and basilar consolidation. Biliary stent. Placement of cholecystostomy tube in the right abdomen. Mild diffuse increased bowel gas. Postsurgical changes in the left abdomen. Scoliosis of the spine. IMPRESSION: 1. Placement of right-sided cholecystostomy tube. Mild diffuse increased bowel gas, possible mild ileus 2. Pleural effusions and basilar consolidations. Electronically Signed   By: Donavan Foil M.D.   On: 05/18/2018 17:05   Dg Chest Port 1v Same Day  Result Date: 05/18/2018 CLINICAL DATA:  Dyspnea EXAM: PORTABLE CHEST 1 VIEW COMPARISON:  03/05/2010, CT 05/08/2018 FINDINGS: Small bila right greater than left pleural effusions. Bibasilar consolidations. Enlarged cardiomediastinal silhouette with vascular congestion and pulmonary edema. Bulky distortion of the right hilar contour, suspicious for a mass or adenopathy. Aortic atherosclerosis. No pneumothorax. Hiatal hernia. IMPRESSION: 1. Right greater than left pleural effusions with bibasilar consolidations. Cardiomegaly with vascular congestion and pulmonary edema 2. Bulky distortion of the right hilar region suspect for mass and/or adenopathy. Recommend contrast-enhanced CT to further evaluate.  Electronically Signed   By: Donavan Foil M.D.   On: 05/18/2018 17:07    Scheduled Meds: . acetaminophen (TYLENOL) oral liquid 160 mg/5 mL  500 mg Oral Q8H  . amoxicillin-clavulanate  500 mg Oral BID  . apixaban  2.5 mg Oral BID  . diltiazem  30 mg Oral Q12H  . docusate sodium  100 mg Oral Daily  .  feeding supplement (ENSURE ENLIVE)  237 mL Oral BID BM  . levothyroxine  50 mcg Oral QAC breakfast  . polyethylene glycol  17 g Oral Daily  . sodium chloride flush  5 mL Intracatheter Q8H      Time spent: 25 min  [facility] Pager [phone]. If 7PM-7AM, please contact night-coverage at [email or web address], Office  [phone]  password [email or web address]  05/19/2018, 2:14 PM  LOS: 11 days

## 2018-05-19 NOTE — Consult Note (Signed)
Ref: Crist Infante, MD   Subjective:  Resting comfortably. VS stable. HR 60-70's. Sinus rhythm. Potassium now down to 5.3. Hypoalbuminemia and Hgb of 10.2 G persist. Troponin I is minimally elevated from demand ischemia.  Objective:  Vital Signs in the last 24 hours: Temp:  [96.4 F (35.8 C)-98.2 F (36.8 C)] 96.4 F (35.8 C) (10/03 0756) Pulse Rate:  [31-77] 66 (10/03 0600) Cardiac Rhythm: Sinus bradycardia (10/02 2137) Resp:  [11-35] 21 (10/03 0600) BP: (62-168)/(36-71) 148/57 (10/03 0600) SpO2:  [89 %-100 %] 100 % (10/03 0600)  Physical Exam: BP Readings from Last 1 Encounters:  05/19/18 (!) 148/57     Wt Readings from Last 1 Encounters:  05/08/18 35.7 kg    Weight change:  Body mass index is 15.66 kg/m. HEENT: [place]/AT, Eyes-Blue, PERL, EOMI, Conjunctiva-Pale, Sclera-Non-icteric Neck: No JVD, No bruit, Trachea midline. Lungs:  Clear, Bilateral. Cardiac:  Regular rhythm, normal S1 and S2, no S3. II/VI systolic murmur. Abdomen:  Soft, non-tender. BS present. Extremities:  1 + edema present. No cyanosis. No clubbing. CNS: AxOx3, Cranial nerves grossly intact, moves all 4 extremities.  Skin: Warm and dry.   Intake/Output from previous day: 10/02 0701 - 10/03 0700 In: 1198.1 [P.O.:240; I.V.:790.7; IV Piggyback:162.4] Out: 880 [Urine:850; Drains:30]    Lab Results: BMET    Component Value Date/Time   NA 129 (L) 05/19/2018 0332   NA 127 (L) 05/18/2018 2126   NA 125 (L) 05/18/2018 1419   NA 137 08/16/2017 1449   NA 135 (L) 06/28/2017 1427   K 5.3 (H) 05/19/2018 0332   K 5.9 (H) 05/18/2018 2126   K 6.3 (HH) 05/18/2018 1419   K 4.0 08/16/2017 1449   K 4.0 06/28/2017 1427   CL 95 (L) 05/19/2018 0332   CL 92 (L) 05/18/2018 2126   CL 90 (L) 05/18/2018 1419   CO2 23 05/19/2018 0332   CO2 27 05/18/2018 2126   CO2 27 05/18/2018 1419   CO2 27 08/16/2017 1449   CO2 25 06/28/2017 1427   GLUCOSE 108 (H) 05/19/2018 0332   GLUCOSE 153 (H) 05/18/2018 2126   GLUCOSE 189 (H)  05/18/2018 1419   GLUCOSE 114 08/16/2017 1449   GLUCOSE 103 06/28/2017 1427   BUN 10 05/19/2018 0332   BUN 12 05/18/2018 2126   BUN 10 05/18/2018 1419   BUN 8.6 08/16/2017 1449   BUN 8.7 06/28/2017 1427   CREATININE 0.44 05/19/2018 0332   CREATININE 0.53 05/18/2018 2126   CREATININE 0.56 05/18/2018 1419   CREATININE 0.71 10/19/2017 1142   CREATININE 0.8 08/16/2017 1449   CREATININE 0.7 06/28/2017 1427   CALCIUM 8.3 (L) 05/19/2018 0332   CALCIUM 8.5 (L) 05/18/2018 2126   CALCIUM 8.3 (L) 05/18/2018 1419   CALCIUM 9.5 08/16/2017 1449   CALCIUM 9.4 06/28/2017 1427   GFRNONAA >60 05/19/2018 0332   GFRNONAA >60 05/18/2018 2126   GFRNONAA >60 05/18/2018 1419   GFRNONAA >60 10/19/2017 1142   GFRAA >60 05/19/2018 0332   GFRAA >60 05/18/2018 2126   GFRAA >60 05/18/2018 1419   GFRAA >60 10/19/2017 1142   CBC    Component Value Date/Time   WBC 16.6 (H) 05/19/2018 0332   RBC 3.48 (L) 05/19/2018 0332   HGB 10.2 (L) 05/19/2018 0332   HGB 12.1 10/19/2017 1142   HGB 12.4 08/16/2017 1449   HCT 31.3 (L) 05/19/2018 0332   HCT 38.1 08/16/2017 1449   PLT 409 (H) 05/19/2018 0332   PLT 284 10/19/2017 1142   PLT 258 08/16/2017  1449   MCV 89.9 05/19/2018 0332   MCV 93.3 08/16/2017 1449   MCH 29.3 05/19/2018 0332   MCHC 32.6 05/19/2018 0332   RDW 16.7 (H) 05/19/2018 0332   RDW 13.8 08/16/2017 1449   LYMPHSABS 1.1 05/16/2018 0539   LYMPHSABS 0.9 08/16/2017 1449   MONOABS 1.2 (H) 05/16/2018 0539   MONOABS 0.5 08/16/2017 1449   EOSABS 0.2 05/16/2018 0539   EOSABS 0.1 08/16/2017 1449   BASOSABS 0.0 05/16/2018 0539   BASOSABS 0.0 08/16/2017 1449   HEPATIC Function Panel Recent Labs    05/08/18 1420 05/09/18 0420 05/19/18 0332  PROT 5.9* 5.3* 5.5*   HEMOGLOBIN A1C No components found for: HGA1C,  MPG CARDIAC ENZYMES Lab Results  Component Value Date   TROPONINI <0.03 05/19/2018   TROPONINI 0.03 (HH) 05/18/2018   TROPONINI 0.04 (HH) 05/18/2018   BNP No results for input(s):  PROBNP in the last 8760 hours. TSH Recent Labs    05/13/18 0314  TSH 7.152*   CHOLESTEROL No results for input(s): CHOL in the last 8760 hours.  Scheduled Meds: . acetaminophen (TYLENOL) oral liquid 160 mg/5 mL  500 mg Oral Q8H  . amoxicillin-clavulanate  500 mg Oral BID  . apixaban  2.5 mg Oral BID  . diltiazem  30 mg Oral Q12H  . docusate sodium  100 mg Oral Daily  . feeding supplement (ENSURE ENLIVE)  237 mL Oral BID BM  . levothyroxine  50 mcg Oral QAC breakfast  . polyethylene glycol  17 g Oral Daily  . sodium chloride flush  5 mL Intracatheter Q8H   Continuous Infusions: . sodium chloride Stopped (05/09/18 1709)  . sodium chloride Stopped (05/15/18 1751)   PRN Meds:.sodium chloride, ALPRAZolam, hydrocortisone, morphine injection, ondansetron **OR** ondansetron (ZOFRAN) IV  Assessment/Plan: Junctional rhythm, resolved Normal sinus rhythm H/O atrial fibrillation, paroxysmal Adverse effect of B-blocker and calcium channel blocker Moderate MR and TR Dysphagia S/P acute cholecystitis S/P perc cholecystostomy Severe protein calorie malnutrition Hypothyroidism Hyperkalemia, resolving  Resume diltiazem at 25 % of previous dose. Decrease IV to [facility]. Hold or discontinue potassium. Increase activity as tolerated.   LOS: 11 days    Dixie Dials  MD  05/19/2018, 8:22 AM

## 2018-05-19 NOTE — Progress Notes (Signed)
Referring Physician(s): Mamie Laurel  Supervising Physician: Jacqulynn Cadet  Patient Status:  [facility] - In-pt  Chief Complaint: None  Subjective:  Acute cholecystitis s/p percutaneous cholecystostomy placement 05/09/2018 with Dr. Pascal Lux. Patient awake and alert sitting in bed with no complaints at this time. Accompanied by son. States that she is going home today. Percutaneous cholecystostomy site c/d/i with minimal bilious drainage in gravity bag.   Allergies: Sulfamethoxazole  Medications: Prior to Admission medications   Medication Sig Start Date End Date Taking? Authorizing Provider  amLODipine (NORVASC) 10 MG tablet Take 10 mg by mouth at bedtime.  06/16/15  Yes [provider]  hydrocortisone (ANUSOL-HC) 2.5 % rectal cream Place 1 application rectally 2 (two) times daily as needed for hemorrhoids or anal itching. 03/22/18  Yes Alla Feeling, NP  ibuprofen (ADVIL,MOTRIN) 100 MG chewable tablet Chew 200 mg by mouth daily as needed for mild pain.   Yes [provider]  levothyroxine (SYNTHROID, LEVOTHROID) 25 MCG tablet Take 25 mcg by mouth daily before breakfast.   Yes [provider]  lipase/protease/amylase (CREON) 12000 units CPEP capsule Take 2 capsules (24,000 Units total) by mouth 3 (three) times daily before meals. Patient taking differently: Take 24,000 Units by mouth 2 (two) times daily.  07/29/17  Yes Truitt Merle, MD  lisinopril (PRINIVIL,ZESTRIL) 5 MG tablet Take 5 mg by mouth at bedtime.    Yes [provider]  Multiple Vitamins-Minerals (MULTIVITAMIN GUMMIES ADULT) CHEW Chew 1 tablet by mouth daily.   Yes [provider]  polyethylene glycol powder (GLYCOLAX/MIRALAX) powder Take 17 g by mouth daily as needed for mild constipation.    Yes [provider]  amoxicillin-clavulanate (AUGMENTIN) 500-125 MG tablet Take 1 tablet (500 mg total) by mouth 2 (two) times daily for 3 days. 05/18/18 05/21/18  Oswald Hillock, MD    apixaban (ELIQUIS) 2.5 MG TABS tablet Take 1 tablet (2.5 mg total) by mouth 2 (two) times daily. 05/18/18   Oswald Hillock, MD  Cholecalciferol (VITAMIN D3) 400 units CAPS Take 2 capsules by mouth daily.    [provider]  diltiazem (CARDIZEM) 120 MG tablet Take 1 tablet (120 mg total) by mouth every 12 (twelve) hours. 05/18/18   Oswald Hillock, MD  LORazepam (ATIVAN) 0.5 MG tablet Take 0.5 tablets (0.25 mg total) by mouth every 6 (six) hours as needed for anxiety (or nausea). 05/18/18   Oswald Hillock, MD  metoprolol tartrate (LOPRESSOR) 25 MG tablet Take 1 tablet (25 mg total) by mouth 2 (two) times daily. 05/18/18   Oswald Hillock, MD  ondansetron (ZOFRAN) 4 MG tablet Take 1 tablet (4 mg total) by mouth every 6 (six) hours as needed for nausea. 05/18/18   Oswald Hillock, MD  traMADol (ULTRAM) 50 MG tablet Take 1 tablet (50 mg total) by mouth every 6 (six) hours as needed for moderate pain. 05/18/18   Oswald Hillock, MD     Vital Signs: BP (!) 130/58   Pulse 70   Temp (!) 96.5 F (35.8 C) (Oral)   Resp 17   Ht 4' 11.49" (1.511 m)   Wt 78 lb 12.8 oz (35.7 kg)   SpO2 99%   BMI 15.66 kg/m   Physical Exam  Constitutional: She is oriented to person, place, and time. She appears well-developed and well-nourished. No distress.  Pulmonary/Chest: Effort normal. No respiratory distress.  Abdominal: Soft. There is no tenderness.  Percutaneous cholecystostomy site without erythema, drainage, or tenderness; minimal bilious  drainage in gravity bag; drain flushes/aspirates without resistance.  Neurological: She is alert and oriented to person, place, and time.  Skin: Skin is warm and dry.  Psychiatric: She has a normal mood and affect. Her behavior is normal. Judgment and thought content normal.  Nursing note and vitals reviewed.   Imaging: Dg Abd 1 View  Result Date: 05/18/2018 CLINICAL DATA:  Dyspnea and abdominal pain EXAM: ABDOMEN - 1 VIEW COMPARISON:  CT 05/08/2018 FINDINGS: Small pleural  effusions and basilar consolidation. Biliary stent. Placement of cholecystostomy tube in the right abdomen. Mild diffuse increased bowel gas. Postsurgical changes in the left abdomen. Scoliosis of the spine. IMPRESSION: 1. Placement of right-sided cholecystostomy tube. Mild diffuse increased bowel gas, possible mild ileus 2. Pleural effusions and basilar consolidations. Electronically Signed   By: Donavan Foil M.D.   On: 05/18/2018 17:05   Dg Chest Port 1v Same Day  Result Date: 05/18/2018 CLINICAL DATA:  Dyspnea EXAM: PORTABLE CHEST 1 VIEW COMPARISON:  03/05/2010, CT 05/08/2018 FINDINGS: Small bila right greater than left pleural effusions. Bibasilar consolidations. Enlarged cardiomediastinal silhouette with vascular congestion and pulmonary edema. Bulky distortion of the right hilar contour, suspicious for a mass or adenopathy. Aortic atherosclerosis. No pneumothorax. Hiatal hernia. IMPRESSION: 1. Right greater than left pleural effusions with bibasilar consolidations. Cardiomegaly with vascular congestion and pulmonary edema 2. Bulky distortion of the right hilar region suspect for mass and/or adenopathy. Recommend contrast-enhanced CT to further evaluate. Electronically Signed   By: Donavan Foil M.D.   On: 05/18/2018 17:07    Labs:  CBC: Recent Labs    05/14/18 0320 05/15/18 0603 05/16/18 0539 05/19/18 0332  WBC 12.7* 13.5* 14.8* 16.6*  HGB 10.0* 10.5* 10.1* 10.2*  HCT 28.6* 31.2* 30.1* 31.3*  PLT 406* 507* 505* 409*    COAGS: Recent Labs    05/09/18 1540  INR 1.61    BMP: Recent Labs    05/14/18 0320 05/18/18 1419 05/18/18 2126 05/19/18 0332  NA 138  137 125* 127* 129*  K 3.8  3.7 6.3* 5.9* 5.3*  CL 106  105 90* 92* 95*  CO2 25  25 27 27 23   GLUCOSE 149*  148* 189* 153* 108*  BUN 9  9 10 12 10   CALCIUM 8.5*  8.5* 8.3* 8.5* 8.3*  CREATININE 0.40*  0.50 0.56 0.53 0.44  GFRNONAA >60  >60 >60 >60 >60  GFRAA >60  >60 >60 >60 >60    LIVER FUNCTION  TESTS: Recent Labs    03/22/18 1614 05/08/18 1420 05/09/18 0420 05/14/18 0320 05/19/18 0332  BILITOT 0.6 1.4* 1.1  --  0.5  AST 39 29 41  --  35  ALT 38 21 20  --  23  ALKPHOS 229* 223* 180*  --  199*  PROT 7.4 5.9* 5.3*  --  5.5*  ALBUMIN 4.2 2.8* 2.3* 2.4* 2.2*    Assessment and Plan:  Acute cholecystitis s/p percutaneous cholecystostomy placement 05/09/2018 with Dr. Pascal Lux. Percutaneous cholecystostomy stable. Continue with Qshift flushes and monitoring of output for now; begin Qdaily flushes and change dressing every 1-2 days once outpatient. Will need F/U cholangiogram/drain exchange at clinic in 4-6 weeks from discharge- order in place.   Electronically Signed: Earley Abide, PA-C 05/19/2018, 2:36 PM   I spent a total of 15 Minutes at the the patient's bedside AND on the patient's hospital floor or unit, greater than 50% of which was counseling/coordinating care for acute cholecystitis s/p percutaneous cholecystostomy placement.

## 2018-05-20 ENCOUNTER — Ambulatory Visit: Payer: Medicare Other

## 2018-05-20 DIAGNOSIS — I480 Paroxysmal atrial fibrillation: Secondary | ICD-10-CM

## 2018-05-20 LAB — BASIC METABOLIC PANEL
Anion gap: 8 (ref 5–15)
BUN: 9 mg/dL (ref 8–23)
CALCIUM: 8.3 mg/dL — AB (ref 8.9–10.3)
CHLORIDE: 91 mmol/L — AB (ref 98–111)
CO2: 29 mmol/L (ref 22–32)
CREATININE: 0.4 mg/dL — AB (ref 0.44–1.00)
GFR calc Af Amer: >60 mL/min (ref >60)
Glucose, Bld: 129 mg/dL — ABNORMAL HIGH (ref 70–99)
POTASSIUM: 4.4 mmol/L (ref 3.5–5.1)
SODIUM: 128 mmol/L — AB (ref 135–145)

## 2018-05-20 MED ORDER — DILTIAZEM HCL 60 MG PO TABS
60.0000 mg | ORAL_TABLET | Freq: Two times a day (BID) | ORAL | Status: DC
  Administered 2018-05-20 – 2018-05-21 (×3): 60 mg via ORAL
  Filled 2018-05-20 (×3): qty 1

## 2018-05-20 MED ORDER — METOPROLOL TARTRATE 12.5 MG HALF TABLET
12.5000 mg | ORAL_TABLET | Freq: Once | ORAL | Status: AC
  Administered 2018-05-20: 12.5 mg via ORAL
  Filled 2018-05-20: qty 1

## 2018-05-20 MED ORDER — DILTIAZEM HCL 30 MG PO TABS
30.0000 mg | ORAL_TABLET | Freq: Once | ORAL | Status: AC
  Administered 2018-05-20: 30 mg via ORAL
  Filled 2018-05-20: qty 1

## 2018-05-20 MED ORDER — DILTIAZEM HCL 60 MG PO TABS
60.0000 mg | ORAL_TABLET | ORAL | Status: AC
  Administered 2018-05-20: 60 mg via ORAL
  Filled 2018-05-20: qty 1

## 2018-05-20 MED ORDER — METOPROLOL TARTRATE 5 MG/5ML IV SOLN
2.5000 mg | INTRAVENOUS | Status: AC
  Administered 2018-05-20: 2.5 mg via INTRAVENOUS
  Filled 2018-05-20: qty 5

## 2018-05-20 MED ORDER — METOPROLOL TARTRATE 12.5 MG HALF TABLET
12.5000 mg | ORAL_TABLET | Freq: Two times a day (BID) | ORAL | Status: DC
  Administered 2018-05-20 – 2018-05-24 (×8): 12.5 mg via ORAL
  Filled 2018-05-20 (×8): qty 1

## 2018-05-20 MED ORDER — DIGOXIN 0.25 MG/ML IJ SOLN
0.2500 mg | INTRAMUSCULAR | Status: AC
  Administered 2018-05-20: 0.25 mg via INTRAVENOUS
  Filled 2018-05-20: qty 1

## 2018-05-20 NOTE — Consult Note (Signed)
Ref: Crist Infante, MD   Subjective:  Back to atrial fibrillation with RVR. Improved heart rate in 70's to 90's with oral diltiazem, IV lanoxin and IV metoprolol 2.5 mg. one time dose. VS stable. Patient is anxious but refuses to take alprazolam.   Objective:  Vital Signs in the last 24 hours: Temp:  [96.4 F (35.8 C)-98.7 F (37.1 C)] 98.1 F (36.7 C) (10/04 0352) Pulse Rate:  [53-95] 95 (10/04 0500) Cardiac Rhythm: (P) Atrial fibrillation (10/04 0450) Resp:  [16-29] 24 (10/04 0500) BP: (107-148)/(42-78) 110/78 (10/04 0500) SpO2:  [96 %-100 %] 96 % (10/04 0500)  Physical Exam: BP Readings from Last 1 Encounters:  05/20/18 110/78     Wt Readings from Last 1 Encounters:  05/08/18 35.7 kg    Weight change:  Body mass index is 15.66 kg/m. HEENT: [place]/AT, Eyes-Blue, PERL, EOMI, Conjunctiva-Pink, Sclera-Non-icteric Neck: No JVD, No bruit, Trachea midline. Lungs:  Clear, Bilateral. Cardiac:  Regular rhythm, normal S1 and S2, no S3. II/VI systolic murmur. Abdomen:  Soft, non-tender. BS present. Extremities:  1 + edema present. No cyanosis. No clubbing. CNS: AxOx3, Cranial nerves grossly intact, moves all 4 extremities.  Skin: Warm and dry.   Intake/Output from previous day: 10/03 0701 - 10/04 0700 In: 276.7 [I.V.:266.7] Out: 675 [Urine:600; Drains:75]    Lab Results: BMET    Component Value Date/Time   NA 129 (L) 05/19/2018 0332   NA 127 (L) 05/18/2018 2126   NA 125 (L) 05/18/2018 1419   NA 137 08/16/2017 1449   NA 135 (L) 06/28/2017 1427   K 5.3 (H) 05/19/2018 0332   K 5.9 (H) 05/18/2018 2126   K 6.3 (HH) 05/18/2018 1419   K 4.0 08/16/2017 1449   K 4.0 06/28/2017 1427   CL 95 (L) 05/19/2018 0332   CL 92 (L) 05/18/2018 2126   CL 90 (L) 05/18/2018 1419   CO2 23 05/19/2018 0332   CO2 27 05/18/2018 2126   CO2 27 05/18/2018 1419   CO2 27 08/16/2017 1449   CO2 25 06/28/2017 1427   GLUCOSE 108 (H) 05/19/2018 0332   GLUCOSE 153 (H) 05/18/2018 2126   GLUCOSE 189 (H)  05/18/2018 1419   GLUCOSE 114 08/16/2017 1449   GLUCOSE 103 06/28/2017 1427   BUN 10 05/19/2018 0332   BUN 12 05/18/2018 2126   BUN 10 05/18/2018 1419   BUN 8.6 08/16/2017 1449   BUN 8.7 06/28/2017 1427   CREATININE 0.44 05/19/2018 0332   CREATININE 0.53 05/18/2018 2126   CREATININE 0.56 05/18/2018 1419   CREATININE 0.71 10/19/2017 1142   CREATININE 0.8 08/16/2017 1449   CREATININE 0.7 06/28/2017 1427   CALCIUM 8.3 (L) 05/19/2018 0332   CALCIUM 8.5 (L) 05/18/2018 2126   CALCIUM 8.3 (L) 05/18/2018 1419   CALCIUM 9.5 08/16/2017 1449   CALCIUM 9.4 06/28/2017 1427   GFRNONAA >60 05/19/2018 0332   GFRNONAA >60 05/18/2018 2126   GFRNONAA >60 05/18/2018 1419   GFRNONAA >60 10/19/2017 1142   GFRAA >60 05/19/2018 0332   GFRAA >60 05/18/2018 2126   GFRAA >60 05/18/2018 1419   GFRAA >60 10/19/2017 1142   CBC    Component Value Date/Time   WBC 16.6 (H) 05/19/2018 0332   RBC 3.48 (L) 05/19/2018 0332   HGB 10.2 (L) 05/19/2018 0332   HGB 12.1 10/19/2017 1142   HGB 12.4 08/16/2017 1449   HCT 31.3 (L) 05/19/2018 0332   HCT 38.1 08/16/2017 1449   PLT 409 (H) 05/19/2018 0332   PLT 284 10/19/2017  1142   PLT 258 08/16/2017 1449   MCV 89.9 05/19/2018 0332   MCV 93.3 08/16/2017 1449   MCH 29.3 05/19/2018 0332   MCHC 32.6 05/19/2018 0332   RDW 16.7 (H) 05/19/2018 0332   RDW 13.8 08/16/2017 1449   LYMPHSABS 1.1 05/16/2018 0539   LYMPHSABS 0.9 08/16/2017 1449   MONOABS 1.2 (H) 05/16/2018 0539   MONOABS 0.5 08/16/2017 1449   EOSABS 0.2 05/16/2018 0539   EOSABS 0.1 08/16/2017 1449   BASOSABS 0.0 05/16/2018 0539   BASOSABS 0.0 08/16/2017 1449   HEPATIC Function Panel Recent Labs    05/08/18 1420 05/09/18 0420 05/19/18 0332  PROT 5.9* 5.3* 5.5*   HEMOGLOBIN A1C No components found for: HGA1C,  MPG CARDIAC ENZYMES Lab Results  Component Value Date   TROPONINI <0.03 05/19/2018   TROPONINI 0.03 (HH) 05/18/2018   TROPONINI 0.04 (HH) 05/18/2018   BNP No results for input(s):  PROBNP in the last 8760 hours. TSH Recent Labs    05/13/18 0314  TSH 7.152*   CHOLESTEROL No results for input(s): CHOL in the last 8760 hours.  Scheduled Meds: . acetaminophen (TYLENOL) oral liquid 160 mg/5 mL  500 mg Oral Q8H  . amoxicillin-clavulanate  500 mg Oral BID  . apixaban  2.5 mg Oral BID  . diltiazem  60 mg Oral Q12H  . docusate sodium  100 mg Oral Daily  . feeding supplement (ENSURE ENLIVE)  237 mL Oral BID BM  . levothyroxine  50 mcg Oral QAC breakfast  . metoprolol tartrate  12.5 mg Oral BID  . polyethylene glycol  17 g Oral Daily  . sodium chloride flush  5 mL Intracatheter Q8H   Continuous Infusions: . sodium chloride Stopped (05/09/18 1709)  . sodium chloride Stopped (05/15/18 1751)   PRN Meds:.sodium chloride, ALPRAZolam, hydrocortisone, morphine injection, ondansetron **OR** ondansetron (ZOFRAN) IV  Assessment/Plan: Atrial fibrillation with RVR to controlled ventricular response S/P acute cholecystitis S/P perc cholecystostomy Severe protein calorie malnutrition Anxiety, generalized Hypothyroidism Dysphagia  Add small dose oral B-blocker and increase diltiazem to 60 mg. twice daily.   LOS: 12 days    Dixie Dials  MD  05/20/2018, 5:45 AM

## 2018-05-20 NOTE — Progress Notes (Signed)
This RN notified Dr. Doylene Canard regarding pt's heartrate above 80 (85-100 fluctuating). Verbal order given for Diltiazem 30mg  and Metoprolol 12.5 mg PO once. Will continue to monitor heartrate.

## 2018-05-20 NOTE — Progress Notes (Signed)
PT Cancellation Note  Patient Details Name: WYNEMA GAROUTTE MRN: [id number] DOB: Jan 05, 1930   Cancelled Treatment:    Reason Eval/Treat Not Completed: Medical issues which prohibited therapy, was in Afib, now with low BP. Check back another time.    Claretha Cooper 05/20/2018, 12:19 PM [place] Pager [phone] Office [phone]

## 2018-05-20 NOTE — Progress Notes (Signed)
[facility]  PROGRESS NOTE  Summer Jones [id number]:[id number] DOB: 02-27-1930 DOA: 05/08/2018 PCP: Crist Infante, MD   Brief HPI:    8 WF history of gastric outlet obstruction-- gastro-jejunostomy on 11/2017,  history of CBD obstruction status post biliary stents,  pancreatic cancer with possible liver mets,  hypertension  Admit 9/22 RUQ abdominal pain, with associated distention, nausea, poor p.o. intake for the past 3 days.   In the ED, CT abdomen and ultrasound showed possible acute cholecystitis, small amount of ascites, with known pancreatic cancer with mets to the liver. Patient admitted for further management.  Patient also developed new onset A. fib in the hospital, started on Eliquis, metoprolol and Cardizem.  Patient developed bradycardia due to medications required transfer to stepdown unit.  Also found to be hyperkalemic.  Potassium supplementation was stopped.  At this time Cardizem has been started at low-dose per cardiology.  Subjective   Patient seen and examined, heart rate is well controlled.  Denies chest pain or shortness of breath.   Assessment/Plan:     1. Bradycardia-resolved, secondary to combination of Cardizem 120 mg twice a day, metoprolol 25 mg twice a day.  Also patient was hyperkalemic with potassium of 6.3.  At this time, bradycardia has resolved.   2. New onset atrial fibrillation-patient is on anticoagulation with Eliquis, cardiology has changed Cardizem to 60 mg twice a day also added Lopressor 12.5 mg p.o. twice daily  3. Hyperkalemia-resolved, yesterday patient's potassium was 6.3, she was getting p.o. supplementation.  Received calcium gluconate, 1 ampule of sodium bicarbonate, at this time potassium is 4.4.    4. Acute cholecystitis status post percutaneous cholecystostomy on 05/09/2018 by IR-patient is afebrile, surgical deep wound showed Klebsiella and viridans streptococci.  Patient is currently on a p.o. Augmentin.  Earlier CT abdomen showed  persistent gallbladder dilatation suggesting cholecystitis, general surgery was consulted, patient was noted to be a poor surgical candidate and status post percutaneous cholecystostomy drain.  Patient to follow-up with IR as outpatient.  Initially she was started on IV Zosyn which has been changed to Augmentin.  For total 2 weeks of therapy stop date on 05/21/2018.  5. Metastatic pancreatic cancer with mets to liver-plan was to start palliative radiation 05/09/2018, follow up with oncology as outpatient.  6. Hypertension-amlodipine and lisinopril have been discontinued due to soft BP and also patient started on p.o. Cardizem.  7. Hyponatremia-slowly improving, today sodium is 128.     CBG: Recent Labs  Lab 05/18/18 1943  GLUCAP 156*    CBC: Recent Labs  Lab 05/14/18 0320 05/15/18 0603 05/16/18 0539 05/19/18 0332  WBC 12.7* 13.5* 14.8* 16.6*  NEUTROABS 10.6* 10.7* 12.3*  --   HGB 10.0* 10.5* 10.1* 10.2*  HCT 28.6* 31.2* 30.1* 31.3*  MCV 83.9 85.7 86.7 89.9  PLT 406* 507* 505* 409*    Basic Metabolic Panel: Recent Labs  Lab 05/14/18 0320 05/18/18 1419 05/18/18 2126 05/19/18 0332 05/20/18 0634  NA 138  137 125* 127* 129* 128*  K 3.8  3.7 6.3* 5.9* 5.3* 4.4  CL 106  105 90* 92* 95* 91*  CO2 25  25 27 27 23 29   GLUCOSE 149*  148* 189* 153* 108* 129*  BUN 9  9 10 12 10 9   CREATININE 0.40*  0.50 0.56 0.53 0.44 0.40*  CALCIUM 8.5*  8.5* 8.3* 8.5* 8.3* 8.3*  MG 1.7 1.8  --   --   --   PHOS 2.5  --   --   --   --  DVT prophylaxis: Apixaban  Code Status: DNR  Family Communication: Discussed with patient's husband at bedside  Disposition Plan: likely skilled nursing facility in next 24 to 48 hours   Consultants:  Cardiology  General surgery  Procedures:  None   Antibiotics:   Anti-infectives (From admission, onward)   Start     Dose/Rate Route Frequency Ordered Stop   05/18/18 0000  amoxicillin-clavulanate (AUGMENTIN) 500-125 MG tablet      500 mg Oral 2 times daily 05/18/18 1228 05/21/18 2359   05/13/18 2200  amoxicillin-clavulanate (AUGMENTIN) 500-125 MG per tablet 500 mg     500 mg Oral 2 times daily 05/13/18 1556     05/12/18 1415  amoxicillin-clavulanate (AUGMENTIN) 875-125 MG per tablet 1 tablet  Status:  Discontinued     1 tablet Oral Every 12 hours 05/12/18 1402 05/13/18 1556   05/09/18 0200  piperacillin-tazobactam (ZOSYN) IVPB 3.375 g  Status:  Discontinued     3.375 g 12.5 mL/hr over 240 Minutes Intravenous Every 8 hours 05/08/18 1915 05/12/18 1402   05/08/18 1845  piperacillin-tazobactam (ZOSYN) IVPB 3.375 g     3.375 g 100 mL/hr over 30 Minutes Intravenous  Once 05/08/18 1833 05/08/18 1908       Objective   Vitals:   05/20/18 0940 05/20/18 1000 05/20/18 1100 05/20/18 1200  BP: (!) 91/52 (!) 92/52 101/81 93/61  Pulse: 71 65 76 82  Resp: (!) 23 (!) 23 (!) 27 (!) 25  Temp:    97.6 F (36.4 C)  TempSrc:    Oral  SpO2: 98% 99% 97% 97%  Weight:      Height:        Intake/Output Summary (Last 24 hours) at 05/20/2018 1423 Last data filed at 05/20/2018 1100 Gross per 24 hour  Intake 135 ml  Output 740 ml  Net -605 ml   Filed Weights   05/08/18 1335  Weight: 35.7 kg     Physical Examination:   Mouth: Oral mucosa is moist, no lesions on palate,  Neck: Supple, no deformities, masses, or tenderness Lungs: Normal respiratory effort, bilateral clear to auscultation, no crackles or wheezes.  Heart: Irregular rhythm,  no murmurs, rubs auscultated Abdomen: BS normoactive,soft,nondistended,non-tender to palpation,no organomegaly Extremities: No pretibial edema, no erythema, no cyanosis, no clubbing Neuro : Alert and oriented to time, place and person, No focal deficits Skin: No rashes seen on exam    Data Reviewed: I have personally reviewed following labs and imaging studies   Recent Results (from the past 240 hour(s))  MRSA PCR Screening     Status: None   Collection Time: 05/12/18  7:19 PM   Result Value Ref Range Status   MRSA by PCR NEGATIVE NEGATIVE Final    Comment:        The GeneXpert MRSA Assay (FDA approved for NASAL specimens only), is one component of a comprehensive MRSA colonization surveillance program. It is not intended to diagnose MRSA infection nor to guide or monitor treatment for MRSA infections. Performed at [facility], [place] [address] [place], [place] [address]      Liver Function Tests: Recent Labs  Lab 05/14/18 0320 05/19/18 0332  AST  --  35  ALT  --  23  ALKPHOS  --  199*  BILITOT  --  0.5  PROT  --  5.5*  ALBUMIN 2.4* 2.2*   No results for input(s): LIPASE, AMYLASE in the last 168 hours. No results for input(s): AMMONIA in the last 168 hours.  Cardiac Enzymes: Recent Labs  Lab 05/18/18 1419 05/18/18 2126 05/19/18 0332  TROPONINI 0.04* 0.03* <0.03   BNP (last 3 results) No results for input(s): BNP in the last 8760 hours.  ProBNP (last 3 results) No results for input(s): PROBNP in the last 8760 hours.    Studies: Dg Abd 1 View  Result Date: 05/18/2018 CLINICAL DATA:  Dyspnea and abdominal pain EXAM: ABDOMEN - 1 VIEW COMPARISON:  CT 05/08/2018 FINDINGS: Small pleural effusions and basilar consolidation. Biliary stent. Placement of cholecystostomy tube in the right abdomen. Mild diffuse increased bowel gas. Postsurgical changes in the left abdomen. Scoliosis of the spine. IMPRESSION: 1. Placement of right-sided cholecystostomy tube. Mild diffuse increased bowel gas, possible mild ileus 2. Pleural effusions and basilar consolidations. Electronically Signed   By: Donavan Foil M.D.   On: 05/18/2018 17:05   Dg Chest Port 1v Same Day  Result Date: 05/18/2018 CLINICAL DATA:  Dyspnea EXAM: PORTABLE CHEST 1 VIEW COMPARISON:  03/05/2010, CT 05/08/2018 FINDINGS: Small bila right greater than left pleural effusions. Bibasilar consolidations. Enlarged cardiomediastinal silhouette with vascular congestion and  pulmonary edema. Bulky distortion of the right hilar contour, suspicious for a mass or adenopathy. Aortic atherosclerosis. No pneumothorax. Hiatal hernia. IMPRESSION: 1. Right greater than left pleural effusions with bibasilar consolidations. Cardiomegaly with vascular congestion and pulmonary edema 2. Bulky distortion of the right hilar region suspect for mass and/or adenopathy. Recommend contrast-enhanced CT to further evaluate. Electronically Signed   By: Donavan Foil M.D.   On: 05/18/2018 17:07    Scheduled Meds: . acetaminophen (TYLENOL) oral liquid 160 mg/5 mL  500 mg Oral Q8H  . amoxicillin-clavulanate  500 mg Oral BID  . apixaban  2.5 mg Oral BID  . diltiazem  60 mg Oral Q12H  . docusate sodium  100 mg Oral Daily  . feeding supplement (ENSURE ENLIVE)  237 mL Oral BID BM  . levothyroxine  50 mcg Oral QAC breakfast  . metoprolol tartrate  12.5 mg Oral BID  . polyethylene glycol  17 g Oral Daily  . sodium chloride flush  5 mL Intracatheter Q8H      Time spent: 25 min  [employer] [facility] Pager [phone]. If 7PM-7AM, please contact night-coverage at [email or web address], Office  [phone]  password [email or web address]  05/20/2018, 2:23 PM  LOS: 12 days

## 2018-05-20 NOTE — Progress Notes (Signed)
At 0800, this nurse helped pt up to bedside commode, HR increased from 70s to 140s in A Fib. Returned to bed and HR remained 80-100. Dr. Doylene Canard notified and ordered early administration of scheduled lopressor and diltiazem. 30 minutes later HR 71. Will continue to monitor.

## 2018-05-20 NOTE — Care Management Note (Signed)
Case Management Note  Patient Details  Name: Summer Jones MRN: [id number] Date of Birth: [birth year]  Subjective/Objective:                  Back to atrial fibrillation with RVR. Improved heart rate in 70's to 90's with oral diltiazem, IV lanoxin and IV metoprolol 2.5 mg. one time dose. VS stable. Patient is anxious but refuses to take alprazolam.   Action/Plan: Following for progression of care and discharge planning No cm needs present at this time.  Expected Discharge Date:  05/18/18               Expected Discharge Plan:  Skilled Nursing Facility  In-House Referral:  Clinical Social Work  Discharge planning Services  CM Consult  Post Acute Care Choice:    Choice offered to:     DME Arranged:    DME Agency:     HH Arranged:    [place] Agency:     Status of Service:  Completed, signed off  If discussed at [employer], dates discussed:    Additional Comments:  Leeroy Cha, RN 05/20/2018, 10:14 AM

## 2018-05-20 NOTE — Progress Notes (Addendum)
Patient became very anxious around 4 am and she went into afib w/RVR HR 120s-140s. She stated she was very anxious about leaving the hospital and going to rehab. Called Dr. Doylene Canard. He ordered 60mg  PO cardizem, 0.25 mg IV digoxin and 2.5mg  IV metoprolol. Administered medications. Continue to monitor.

## 2018-05-21 LAB — BASIC METABOLIC PANEL WITH GFR
Anion gap: 8 (ref 5–15)
BUN: 8 mg/dL (ref 8–23)
CO2: 34 mmol/L — ABNORMAL HIGH (ref 22–32)
Calcium: 8.3 mg/dL — ABNORMAL LOW (ref 8.9–10.3)
Chloride: 90 mmol/L — ABNORMAL LOW (ref 98–111)
Creatinine, Ser: 0.42 mg/dL — ABNORMAL LOW (ref 0.44–1.00)
GFR calc Af Amer: >60 mL/min (ref >60)
GFR calc non Af Amer: >60 mL/min (ref >60)
Glucose, Bld: 147 mg/dL — ABNORMAL HIGH (ref 70–99)
Potassium: 4.3 mmol/L (ref 3.5–5.1)
Sodium: 132 mmol/L — ABNORMAL LOW (ref 135–145)

## 2018-05-21 LAB — MAGNESIUM: Magnesium: 1.9 mg/dL (ref 1.7–2.4)

## 2018-05-21 MED ORDER — DILTIAZEM HCL 60 MG PO TABS
60.0000 mg | ORAL_TABLET | Freq: Three times a day (TID) | ORAL | Status: DC
  Administered 2018-05-21 – 2018-05-24 (×7): 60 mg via ORAL
  Filled 2018-05-21 (×8): qty 1

## 2018-05-21 MED ORDER — ALBUMIN HUMAN 25 % IV SOLN
12.5000 g | Freq: Once | INTRAVENOUS | Status: AC
  Administered 2018-05-21: 12.5 g via INTRAVENOUS
  Filled 2018-05-21: qty 50

## 2018-05-21 NOTE — Plan of Care (Signed)
  Problem: Elimination: Goal: Will not experience complications related to bowel motility Outcome: Progressing   

## 2018-05-21 NOTE — Progress Notes (Signed)
[facility]  PROGRESS NOTE  Summer Jones [id number]:[id number] DOB: May 23, 1930 DOA: 05/08/2018 PCP: Crist Infante, MD   Brief HPI:    61 WF history of gastric outlet obstruction-- gastro-jejunostomy on 11/2017,  history of CBD obstruction status post biliary stents,  pancreatic cancer with possible liver mets,  hypertension  Admit 9/22 RUQ abdominal pain, with associated distention, nausea, poor p.o. intake for the past 3 days.   In the ED, CT abdomen and ultrasound showed possible acute cholecystitis, small amount of ascites, with known pancreatic cancer with mets to the liver. Patient admitted for further management.  Patient also developed new onset A. fib in the hospital, started on Eliquis, metoprolol and Cardizem.  Patient developed bradycardia due to medications required transfer to stepdown unit.  Also found to be hyperkalemic.  Potassium supplementation was stopped.  At this time Cardizem has been started at low-dose per cardiology.  Subjective   Patient seen and examined, lethargic this morning.  Denies shortness of breath.  Heart rate is still fluctuating.  Started on Cardizem 60 mg twice a day with Lopressor 12.5 mg p.o. twice daily   Assessment/Plan:     1. Bradycardia-resolved, secondary to combination of Cardizem 120 mg twice a day, metoprolol 25 mg twice a day.  Also patient was hyperkalemic with potassium of 6.3.  At this time, bradycardia has resolved.   2. New onset atrial fibrillation-patient is on anticoagulation with Eliquis, cardiology has changed Cardizem to 60 mg twice a day also added Lopressor 12.5 mg p.o. twice daily.  We will continue to monitor for next 24 hours.  If stable likely can be discharged in a.m.  3. Hyperkalemia-resolved, yesterday patient's potassium was 6.3, she was getting p.o. supplementation.  Received calcium gluconate, 1 ampule of sodium bicarbonate, at this time potassium is 4.4.    4. Acute cholecystitis status post percutaneous  cholecystostomy on 05/09/2018 by IR-patient is afebrile, surgical deep wound showed Klebsiella and viridans streptococci.  Patient is currently on a p.o. Augmentin.  Earlier CT abdomen showed persistent gallbladder dilatation suggesting cholecystitis, general surgery was consulted, patient was noted to be a poor surgical candidate and status post percutaneous cholecystostomy drain.  Patient to follow-up with IR as outpatient.  Initially she was started on IV Zosyn which has been changed to Augmentin.  For total 2 weeks of therapy stop date on 05/21/2018.  5. Metastatic pancreatic cancer with mets to liver-plan was to start palliative radiation 05/09/2018, follow up with oncology as outpatient.  6. Hypertension-amlodipine and lisinopril have been discontinued due to soft BP and also patient started on p.o. Cardizem.  7. Hyponatremia-slowly improving, today sodium is 128.     CBG: Recent Labs  Lab 05/18/18 1943  GLUCAP 156*    CBC: Recent Labs  Lab 05/15/18 0603 05/16/18 0539 05/19/18 0332  WBC 13.5* 14.8* 16.6*  NEUTROABS 10.7* 12.3*  --   HGB 10.5* 10.1* 10.2*  HCT 31.2* 30.1* 31.3*  MCV 85.7 86.7 89.9  PLT 507* 505* 409*    Basic Metabolic Panel: Recent Labs  Lab 05/18/18 1419 05/18/18 2126 05/19/18 0332 05/20/18 0634  NA 125* 127* 129* 128*  K 6.3* 5.9* 5.3* 4.4  CL 90* 92* 95* 91*  CO2 27 27 23 29   GLUCOSE 189* 153* 108* 129*  BUN 10 12 10 9   CREATININE 0.56 0.53 0.44 0.40*  CALCIUM 8.3* 8.5* 8.3* 8.3*  MG 1.8  --   --   --      DVT prophylaxis: Apixaban  Code  Status: DNR  Family Communication: Discussed with patient's husband at bedside  Disposition Plan: likely skilled nursing facility in next 24 to 48 hours   Consultants:  Cardiology  General surgery  Procedures:  None   Antibiotics:   Anti-infectives (From admission, onward)   Start     Dose/Rate Route Frequency Ordered Stop   05/18/18 0000  amoxicillin-clavulanate (AUGMENTIN) 500-125 MG  tablet     500 mg Oral 2 times daily 05/18/18 1228 05/21/18 2359   05/13/18 2200  amoxicillin-clavulanate (AUGMENTIN) 500-125 MG per tablet 500 mg     500 mg Oral 2 times daily 05/13/18 1556     05/12/18 1415  amoxicillin-clavulanate (AUGMENTIN) 875-125 MG per tablet 1 tablet  Status:  Discontinued     1 tablet Oral Every 12 hours 05/12/18 1402 05/13/18 1556   05/09/18 0200  piperacillin-tazobactam (ZOSYN) IVPB 3.375 g  Status:  Discontinued     3.375 g 12.5 mL/hr over 240 Minutes Intravenous Every 8 hours 05/08/18 1915 05/12/18 1402   05/08/18 1845  piperacillin-tazobactam (ZOSYN) IVPB 3.375 g     3.375 g 100 mL/hr over 30 Minutes Intravenous  Once 05/08/18 1833 05/08/18 1908       Objective   Vitals:   05/21/18 0200 05/21/18 0346 05/21/18 0450 05/21/18 0700  BP: (!) 93/50  (!) 105/54 (!) 125/41  Pulse: 73   83  Resp: (!) 23   (!) 22  Temp:  (!) 97.5 F (36.4 C)    TempSrc:  Axillary    SpO2: 99%   98%  Weight:      Height:        Intake/Output Summary (Last 24 hours) at 05/21/2018 1211 Last data filed at 05/21/2018 0500 Gross per 24 hour  Intake 240 ml  Output 1125 ml  Net -885 ml   Filed Weights   05/08/18 1335  Weight: 35.7 kg     Physical Examination:   Neck: Supple, no deformities, masses, or tenderness Lungs: Normal respiratory effort, bilateral clear to auscultation, no crackles or wheezes.  Heart: Irregular rhythm, no murmurs auscultated Abdomen: BS normoactive,soft,nondistended,non-tender to palpation,no organomegaly Extremities: No pretibial edema, no erythema, no cyanosis, no clubbing Neuro : Alert and oriented to time, place and person, No focal deficits Skin: No rashes seen on exam    Data Reviewed: I have personally reviewed following labs and imaging studies   Recent Results (from the past 240 hour(s))  MRSA PCR Screening     Status: None   Collection Time: 05/12/18  7:19 PM  Result Value Ref Range Status   MRSA by PCR NEGATIVE NEGATIVE  Final    Comment:        The GeneXpert MRSA Assay (FDA approved for NASAL specimens only), is one component of a comprehensive MRSA colonization surveillance program. It is not intended to diagnose MRSA infection nor to guide or monitor treatment for MRSA infections. Performed at [facility], [place] [address] [place], [place] [address]   Aerobic Culture (superficial specimen)     Status: None (Preliminary result)   Collection Time: 05/20/18 11:36 AM  Result Value Ref Range Status   Specimen Description   Final    BILE Performed at [place] [address] [place], [place] [address]    Special Requests   Final    NONE Performed at [facility], [place] [address] [place],  [address]    Gram Stain PENDING  Incomplete   Culture   Final  CULTURE REINCUBATED FOR BETTER GROWTH Performed at [facility], [place] [address], [place], [place] [address]    Report Status PENDING  Incomplete     Liver Function Tests: Recent Labs  Lab 05/19/18 0332  AST 35  ALT 23  ALKPHOS 199*  BILITOT 0.5  PROT 5.5*  ALBUMIN 2.2*   No results for input(s): LIPASE, AMYLASE in the last 168 hours. No results for input(s): AMMONIA in the last 168 hours.  Cardiac Enzymes: Recent Labs  Lab 05/18/18 1419 05/18/18 2126 05/19/18 0332  TROPONINI 0.04* 0.03* <0.03   BNP (last 3 results) No results for input(s): BNP in the last 8760 hours.  ProBNP (last 3 results) No results for input(s): PROBNP in the last 8760 hours.    Studies: No results found.  Scheduled Meds: . acetaminophen (TYLENOL) oral liquid 160 mg/5 mL  500 mg Oral Q8H  . amoxicillin-clavulanate  500 mg Oral BID  . apixaban  2.5 mg Oral BID  . diltiazem  60 mg Oral Q12H  . docusate sodium  100 mg Oral Daily  . feeding supplement (ENSURE ENLIVE)  237 mL Oral BID BM  . levothyroxine  50 mcg Oral QAC breakfast  . metoprolol tartrate  12.5 mg Oral BID  .  polyethylene glycol  17 g Oral Daily  . sodium chloride flush  5 mL Intracatheter Q8H      Time spent: 25 min  [facility] Pager [phone]. If 7PM-7AM, please contact night-coverage at [email or web address], Office  [phone]  password [place]  05/21/2018, 12:11 PM  LOS: 13 days

## 2018-05-21 NOTE — Consult Note (Addendum)
Ref: Summer Infante, MD   Subjective:  Awake. VS stable. Mild bilateral lower leg edema. No chest pain.   Objective:  Vital Signs in the last 24 hours: Temp:  [97.4 F (36.3 C)-98.3 F (36.8 C)] 97.5 F (36.4 C) (10/05 1200) Pulse Rate:  [36-89] 83 (10/05 0700) Cardiac Rhythm: Atrial fibrillation (10/05 0711) Resp:  [17-30] 26 (10/05 1001) BP: (93-127)/(41-76) 106/48 (10/05 1001) SpO2:  [96 %-100 %] 98 % (10/05 0700)  Physical Exam: BP Readings from Last 1 Encounters:  05/21/18 (!) 106/48     Wt Readings from Last 1 Encounters:  05/08/18 35.7 kg    Weight change:  Body mass index is 15.66 kg/m. HEENT: Summer Jones/AT, Eyes-Blue, PERL, EOMI, Conjunctiva-Pink, Sclera-Non-icteric Neck: No JVD, No bruit, Trachea midline. Lungs:  Clear, Bilateral. Cardiac:  Regular rhythm, normal S1 and S2, no S3. II/VI systolic murmur. Abdomen:  Soft, non-tender. BS present. Extremities:  1 + edema present. No cyanosis. No clubbing. CNS: AxOx3, Cranial nerves grossly intact, moves all 4 extremities.  Skin: Warm and dry.   Intake/Output from previous day: 10/04 0701 - 10/05 0700 In: 365 [P.O.:120; I.V.:240] Out: 1190 [Urine:1175; Drains:15]    Lab Results: BMET    Component Value Date/Time   NA 132 (L) 05/21/2018 1344   NA 128 (L) 05/20/2018 0634   NA 129 (L) 05/19/2018 0332   NA 137 08/16/2017 1449   NA 135 (L) 06/28/2017 1427   K 4.3 05/21/2018 1344   K 4.4 05/20/2018 0634   K 5.3 (H) 05/19/2018 0332   K 4.0 08/16/2017 1449   K 4.0 06/28/2017 1427   CL 90 (L) 05/21/2018 1344   CL 91 (L) 05/20/2018 0634   CL 95 (L) 05/19/2018 0332   CO2 34 (H) 05/21/2018 1344   CO2 29 05/20/2018 0634   CO2 23 05/19/2018 0332   CO2 27 08/16/2017 1449   CO2 25 06/28/2017 1427   GLUCOSE 147 (H) 05/21/2018 1344   GLUCOSE 129 (H) 05/20/2018 0634   GLUCOSE 108 (H) 05/19/2018 0332   GLUCOSE 114 08/16/2017 1449   GLUCOSE 103 06/28/2017 1427   BUN 8 05/21/2018 1344   BUN 9 05/20/2018 0634   BUN 10  05/19/2018 0332   BUN 8.6 08/16/2017 1449   BUN 8.7 06/28/2017 1427   CREATININE 0.42 (L) 05/21/2018 1344   CREATININE 0.40 (L) 05/20/2018 0634   CREATININE 0.44 05/19/2018 0332   CREATININE 0.71 10/19/2017 1142   CREATININE 0.8 08/16/2017 1449   CREATININE 0.7 06/28/2017 1427   CALCIUM 8.3 (L) 05/21/2018 1344   CALCIUM 8.3 (L) 05/20/2018 0634   CALCIUM 8.3 (L) 05/19/2018 0332   CALCIUM 9.5 08/16/2017 1449   CALCIUM 9.4 06/28/2017 1427   GFRNONAA >60 05/21/2018 1344   GFRNONAA >60 05/20/2018 0634   GFRNONAA >60 05/19/2018 0332   GFRNONAA >60 10/19/2017 1142   GFRAA >60 05/21/2018 1344   GFRAA >60 05/20/2018 0634   GFRAA >60 05/19/2018 0332   GFRAA >60 10/19/2017 1142   CBC    Component Value Date/Time   WBC 16.6 (H) 05/19/2018 0332   RBC 3.48 (L) 05/19/2018 0332   HGB 10.2 (L) 05/19/2018 0332   HGB 12.1 10/19/2017 1142   HGB 12.4 08/16/2017 1449   HCT 31.3 (L) 05/19/2018 0332   HCT 38.1 08/16/2017 1449   PLT 409 (H) 05/19/2018 0332   PLT 284 10/19/2017 1142   PLT 258 08/16/2017 1449   MCV 89.9 05/19/2018 0332   MCV 93.3 08/16/2017 1449   MCH 29.3  05/19/2018 0332   MCHC 32.6 05/19/2018 0332   RDW 16.7 (H) 05/19/2018 0332   RDW 13.8 08/16/2017 1449   LYMPHSABS 1.1 05/16/2018 0539   LYMPHSABS 0.9 08/16/2017 1449   MONOABS 1.2 (H) 05/16/2018 0539   MONOABS 0.5 08/16/2017 1449   EOSABS 0.2 05/16/2018 0539   EOSABS 0.1 08/16/2017 1449   BASOSABS 0.0 05/16/2018 0539   BASOSABS 0.0 08/16/2017 1449   HEPATIC Function Panel Recent Labs    05/08/18 1420 05/09/18 0420 05/19/18 0332  PROT 5.9* 5.3* 5.5*   HEMOGLOBIN A1C No components found for: HGA1C,  MPG CARDIAC ENZYMES Lab Results  Component Value Date   TROPONINI <0.03 05/19/2018   TROPONINI 0.03 (HH) 05/18/2018   TROPONINI 0.04 (HH) 05/18/2018   BNP No results for input(s): PROBNP in the last 8760 hours. TSH Recent Labs    05/13/18 0314  TSH 7.152*   CHOLESTEROL No results for input(s): CHOL in the  last 8760 hours.  Scheduled Meds: . acetaminophen (TYLENOL) oral liquid 160 mg/5 mL  500 mg Oral Q8H  . amoxicillin-clavulanate  500 mg Oral BID  . apixaban  2.5 mg Oral BID  . diltiazem  60 mg Oral Q8H  . docusate sodium  100 mg Oral Daily  . feeding supplement (ENSURE ENLIVE)  237 mL Oral BID BM  . levothyroxine  50 mcg Oral QAC breakfast  . metoprolol tartrate  12.5 mg Oral BID  . polyethylene glycol  17 g Oral Daily  . sodium chloride flush  5 mL Intracatheter Q8H   Continuous Infusions: . sodium chloride Stopped (05/09/18 1709)  . sodium chloride Stopped (05/15/18 1751)   PRN Meds:.sodium chloride, ALPRAZolam, hydrocortisone, morphine injection, ondansetron **OR** ondansetron (ZOFRAN) IV  Assessment/Plan: Atrial fibrillation, controlled, [id number] score of 4 and on Eliquis S/P acute cholecystitis S/P perc cholecystostomy Severe protein calorie malnutrition Generalized anxiety Hypothyroidism Dysphagia Hypoalbuminemia  Increase marginally Diltiazem dose. IV albumin x one    LOS: 13 days    Summer Dials  MD  05/21/2018, 3:35 PM

## 2018-05-22 LAB — AEROBIC CULTURE W GRAM STAIN (SUPERFICIAL SPECIMEN)

## 2018-05-22 LAB — BASIC METABOLIC PANEL
Anion gap: 6 (ref 5–15)
BUN: 8 mg/dL (ref 8–23)
CO2: 31 mmol/L (ref 22–32)
CREATININE: 0.42 mg/dL — AB (ref 0.44–1.00)
Calcium: 7.9 mg/dL — ABNORMAL LOW (ref 8.9–10.3)
Chloride: 92 mmol/L — ABNORMAL LOW (ref 98–111)
GFR calc Af Amer: >60 mL/min (ref >60)
GLUCOSE: 128 mg/dL — AB (ref 70–99)
POTASSIUM: 4.3 mmol/L (ref 3.5–5.1)
Sodium: 129 mmol/L — ABNORMAL LOW (ref 135–145)

## 2018-05-22 LAB — AEROBIC CULTURE  (SUPERFICIAL SPECIMEN)

## 2018-05-22 MED ORDER — METOPROLOL TARTRATE 25 MG PO TABS: 12.5000 mg | ORAL_TABLET | Freq: Two times a day (BID) | ORAL | Status: DC

## 2018-05-22 MED ORDER — DILTIAZEM HCL 60 MG PO TABS: 60.0000 mg | ORAL_TABLET | Freq: Three times a day (TID) | ORAL | Status: DC

## 2018-05-22 MED ORDER — PANTOPRAZOLE SODIUM 40 MG PO PACK
40.0000 mg | PACK | Freq: Every day | ORAL | Status: DC
  Administered 2018-05-22 – 2018-05-23 (×2): 40 mg via ORAL
  Filled 2018-05-22 (×2): qty 20

## 2018-05-22 MED ORDER — PANTOPRAZOLE SODIUM 40 MG PO PACK: 40.0000 mg | PACK | Freq: Every day | ORAL | Status: AC

## 2018-05-22 NOTE — Progress Notes (Addendum)
CSW received call from patient's nurse. Patient likely to be discharged today.  CSW aware plan was for patient to discharge to [facility] last week, but the discharge was put on hold for continued medical workup and stabilization.   CSW reached out to [place], Engineer, site at [employer] [phone]). CSW waiting for confirmation that facility can accept patient today, will update with follow up.   UPDATE:  Patient will need new insurance authorization. The facility received updated notes, patient will not be able to go today.  Stephanie Acre, [place] Social Worker [phone]

## 2018-05-22 NOTE — Consult Note (Signed)
Ref: Crist Infante, MD   Subjective:  Awake. 98 % oxygen saturation on 2 L of oxygen. BP improved to 110's/50's. Heart rate 50-80's.   Objective:  Vital Signs in the last 24 hours: Temp:  [97.4 F (36.3 C)-97.6 F (36.4 C)] 97.4 F (36.3 C) (10/06 0800) Pulse Rate:  [56-96] 56 (10/06 0509) Cardiac Rhythm: Atrial fibrillation (10/06 0701) Resp:  [18-27] 27 (10/06 0509) BP: (82-127)/(44-68) 115/51 (10/06 0509) SpO2:  [91 %-99 %] 98 % (10/06 0509)  Physical Exam: BP Readings from Last 1 Encounters:  05/22/18 (!) 115/51     Wt Readings from Last 1 Encounters:  05/08/18 35.7 kg    Weight change:  Body mass index is 15.66 kg/m. HEENT: [place]/AT, Eyes-Blue, PERL, EOMI, Conjunctiva-Pink, Sclera-Non-icteric Neck: No JVD, No bruit, Trachea midline. Lungs:  Clear, Bilateral. Cardiac:  Regular rhythm, normal S1 and S2, no S3. II/VI systolic murmur. Abdomen:  Soft, non-tender. BS present. Extremities:  1 + edema present. No cyanosis. No clubbing. CNS: AxOx3, Cranial nerves grossly intact, moves all 4 extremities.  Skin: Warm and dry.   Intake/Output from previous day: 10/05 0701 - 10/06 0700 In: 115 [I.V.:100] Out: 380 [Urine:350; Drains:30]    Lab Results: BMET    Component Value Date/Time   NA 129 (L) 05/22/2018 0349   NA 132 (L) 05/21/2018 1344   NA 128 (L) 05/20/2018 0634   NA 137 08/16/2017 1449   NA 135 (L) 06/28/2017 1427   K 4.3 05/22/2018 0349   K 4.3 05/21/2018 1344   K 4.4 05/20/2018 0634   K 4.0 08/16/2017 1449   K 4.0 06/28/2017 1427   CL 92 (L) 05/22/2018 0349   CL 90 (L) 05/21/2018 1344   CL 91 (L) 05/20/2018 0634   CO2 31 05/22/2018 0349   CO2 34 (H) 05/21/2018 1344   CO2 29 05/20/2018 0634   CO2 27 08/16/2017 1449   CO2 25 06/28/2017 1427   GLUCOSE 128 (H) 05/22/2018 0349   GLUCOSE 147 (H) 05/21/2018 1344   GLUCOSE 129 (H) 05/20/2018 0634   GLUCOSE 114 08/16/2017 1449   GLUCOSE 103 06/28/2017 1427   BUN 8 05/22/2018 0349   BUN 8 05/21/2018 1344   BUN 9 05/20/2018 0634   BUN 8.6 08/16/2017 1449   BUN 8.7 06/28/2017 1427   CREATININE 0.42 (L) 05/22/2018 0349   CREATININE 0.42 (L) 05/21/2018 1344   CREATININE 0.40 (L) 05/20/2018 0634   CREATININE 0.71 10/19/2017 1142   CREATININE 0.8 08/16/2017 1449   CREATININE 0.7 06/28/2017 1427   CALCIUM 7.9 (L) 05/22/2018 0349   CALCIUM 8.3 (L) 05/21/2018 1344   CALCIUM 8.3 (L) 05/20/2018 0634   CALCIUM 9.5 08/16/2017 1449   CALCIUM 9.4 06/28/2017 1427   GFRNONAA >60 05/22/2018 0349   GFRNONAA >60 05/21/2018 1344   GFRNONAA >60 05/20/2018 0634   GFRNONAA >60 10/19/2017 1142   GFRAA >60 05/22/2018 0349   GFRAA >60 05/21/2018 1344   GFRAA >60 05/20/2018 0634   GFRAA >60 10/19/2017 1142   CBC    Component Value Date/Time   WBC 16.6 (H) 05/19/2018 0332   RBC 3.48 (L) 05/19/2018 0332   HGB 10.2 (L) 05/19/2018 0332   HGB 12.1 10/19/2017 1142   HGB 12.4 08/16/2017 1449   HCT 31.3 (L) 05/19/2018 0332   HCT 38.1 08/16/2017 1449   PLT 409 (H) 05/19/2018 0332   PLT 284 10/19/2017 1142   PLT 258 08/16/2017 1449   MCV 89.9 05/19/2018 0332   MCV 93.3 08/16/2017 1449  MCH 29.3 05/19/2018 0332   MCHC 32.6 05/19/2018 0332   RDW 16.7 (H) 05/19/2018 0332   RDW 13.8 08/16/2017 1449   LYMPHSABS 1.1 05/16/2018 0539   LYMPHSABS 0.9 08/16/2017 1449   MONOABS 1.2 (H) 05/16/2018 0539   MONOABS 0.5 08/16/2017 1449   EOSABS 0.2 05/16/2018 0539   EOSABS 0.1 08/16/2017 1449   BASOSABS 0.0 05/16/2018 0539   BASOSABS 0.0 08/16/2017 1449   HEPATIC Function Panel Recent Labs    05/08/18 1420 05/09/18 0420 05/19/18 0332  PROT 5.9* 5.3* 5.5*   HEMOGLOBIN A1C No components found for: HGA1C,  MPG CARDIAC ENZYMES Lab Results  Component Value Date   TROPONINI <0.03 05/19/2018   TROPONINI 0.03 (HH) 05/18/2018   TROPONINI 0.04 (HH) 05/18/2018   BNP No results for input(s): PROBNP in the last 8760 hours. TSH Recent Labs    05/13/18 0314  TSH 7.152*   CHOLESTEROL No results for input(s):  CHOL in the last 8760 hours.  Scheduled Meds: . acetaminophen (TYLENOL) oral liquid 160 mg/5 mL  500 mg Oral Q8H  . amoxicillin-clavulanate  500 mg Oral BID  . apixaban  2.5 mg Oral BID  . diltiazem  60 mg Oral Q8H  . docusate sodium  100 mg Oral Daily  . feeding supplement (ENSURE ENLIVE)  237 mL Oral BID BM  . levothyroxine  50 mcg Oral QAC breakfast  . metoprolol tartrate  12.5 mg Oral BID  . polyethylene glycol  17 g Oral Daily  . sodium chloride flush  5 mL Intracatheter Q8H   Continuous Infusions: . sodium chloride Stopped (05/09/18 1709)  . sodium chloride Stopped (05/15/18 1751)   PRN Meds:.sodium chloride, ALPRAZolam, hydrocortisone, morphine injection, ondansetron **OR** ondansetron (ZOFRAN) IV  Assessment/Plan: Atrial fibrillation S/P acute cholecystitis S/P perc cholecystostomy Severe protein calorie malnutrition Generalized anxiety Hypothyroidism Dysphagia Hypoalbuminemia  Continue Eliquis, low dose diltiazem and metoprolol. Add pantoprazole 40 mg. suspension daily Increase activity as tolerated. May use humidified oxygen at 2 L/min as needed. F/U 1 month.   LOS: 14 days    Dixie Dials  MD  05/22/2018, 11:05 AM

## 2018-05-23 ENCOUNTER — Ambulatory Visit: Payer: Medicare Other | Admitting: Radiation Oncology

## 2018-05-23 ENCOUNTER — Ambulatory Visit: Payer: Medicare Other

## 2018-05-23 MED ORDER — SIMETHICONE 80 MG PO CHEW
80.0000 mg | CHEWABLE_TABLET | Freq: Four times a day (QID) | ORAL | Status: DC
  Administered 2018-05-23 – 2018-05-24 (×7): 80 mg via ORAL
  Filled 2018-05-23 (×7): qty 1

## 2018-05-23 MED ORDER — FUROSEMIDE 20 MG PO TABS: 20.0000 mg | ORAL_TABLET | Freq: Every day | ORAL | 11 refills | Status: DC

## 2018-05-23 MED ORDER — FUROSEMIDE 10 MG/ML IJ SOLN
20.0000 mg | Freq: Two times a day (BID) | INTRAMUSCULAR | Status: DC
  Administered 2018-05-23 – 2018-05-24 (×3): 20 mg via INTRAVENOUS
  Filled 2018-05-23 (×3): qty 2

## 2018-05-23 NOTE — Consult Note (Signed)
Ref: Crist Infante, MD   Subjective:  Afebrile. Heart rate in 80's, atrial fibrillation on monitor.   Objective:  Vital Signs in the last 24 hours: Temp:  [96.8 F (36 C)-98.2 F (36.8 C)] 96.8 F (36 C) (10/07 1153) Pulse Rate:  [53-135] 135 (10/07 1200) Cardiac Rhythm: Atrial fibrillation (10/07 0800) Resp:  [19-31] 19 (10/07 1200) BP: (86-126)/(43-94) 86/55 (10/07 1200) SpO2:  [91 %-98 %] 91 % (10/07 1100)  Physical Exam: BP Readings from Last 1 Encounters:  05/23/18 (!) 86/55     Wt Readings from Last 1 Encounters:  05/08/18 35.7 kg    Weight change:  Body mass index is 15.66 kg/m. HEENT: [place]/AT, Eyes-Blue, PERL, EOMI, Conjunctiva-Pink, Sclera-Non-icteric Neck: No JVD, No bruit, Trachea midline. Lungs:  Clear, Bilateral. Cardiac:  Regular rhythm, normal S1 and S2, no S3. II/VI systolic murmur. Abdomen:  Soft, non-tender. BS present. Extremities:  1 + edema present. No cyanosis. No clubbing. CNS: AxOx3, Cranial nerves grossly intact, moves all 4 extremities.  Skin: Warm and dry.   Intake/Output from previous day: 10/06 0701 - 10/07 0700 In: 46 [P.O.:60] Out: 375 [Urine:375]    Lab Results: BMET    Component Value Date/Time   NA 129 (L) 05/22/2018 0349   NA 132 (L) 05/21/2018 1344   NA 128 (L) 05/20/2018 0634   NA 137 08/16/2017 1449   NA 135 (L) 06/28/2017 1427   K 4.3 05/22/2018 0349   K 4.3 05/21/2018 1344   K 4.4 05/20/2018 0634   K 4.0 08/16/2017 1449   K 4.0 06/28/2017 1427   CL 92 (L) 05/22/2018 0349   CL 90 (L) 05/21/2018 1344   CL 91 (L) 05/20/2018 0634   CO2 31 05/22/2018 0349   CO2 34 (H) 05/21/2018 1344   CO2 29 05/20/2018 0634   CO2 27 08/16/2017 1449   CO2 25 06/28/2017 1427   GLUCOSE 128 (H) 05/22/2018 0349   GLUCOSE 147 (H) 05/21/2018 1344   GLUCOSE 129 (H) 05/20/2018 0634   GLUCOSE 114 08/16/2017 1449   GLUCOSE 103 06/28/2017 1427   BUN 8 05/22/2018 0349   BUN 8 05/21/2018 1344   BUN 9 05/20/2018 0634   BUN 8.6 08/16/2017 1449    BUN 8.7 06/28/2017 1427   CREATININE 0.42 (L) 05/22/2018 0349   CREATININE 0.42 (L) 05/21/2018 1344   CREATININE 0.40 (L) 05/20/2018 0634   CREATININE 0.71 10/19/2017 1142   CREATININE 0.8 08/16/2017 1449   CREATININE 0.7 06/28/2017 1427   CALCIUM 7.9 (L) 05/22/2018 0349   CALCIUM 8.3 (L) 05/21/2018 1344   CALCIUM 8.3 (L) 05/20/2018 0634   CALCIUM 9.5 08/16/2017 1449   CALCIUM 9.4 06/28/2017 1427   GFRNONAA >60 05/22/2018 0349   GFRNONAA >60 05/21/2018 1344   GFRNONAA >60 05/20/2018 0634   GFRNONAA >60 10/19/2017 1142   GFRAA >60 05/22/2018 0349   GFRAA >60 05/21/2018 1344   GFRAA >60 05/20/2018 0634   GFRAA >60 10/19/2017 1142   CBC    Component Value Date/Time   WBC 16.6 (H) 05/19/2018 0332   RBC 3.48 (L) 05/19/2018 0332   HGB 10.2 (L) 05/19/2018 0332   HGB 12.1 10/19/2017 1142   HGB 12.4 08/16/2017 1449   HCT 31.3 (L) 05/19/2018 0332   HCT 38.1 08/16/2017 1449   PLT 409 (H) 05/19/2018 0332   PLT 284 10/19/2017 1142   PLT 258 08/16/2017 1449   MCV 89.9 05/19/2018 0332   MCV 93.3 08/16/2017 1449   MCH 29.3 05/19/2018 0332  MCHC 32.6 05/19/2018 0332   RDW 16.7 (H) 05/19/2018 0332   RDW 13.8 08/16/2017 1449   LYMPHSABS 1.1 05/16/2018 0539   LYMPHSABS 0.9 08/16/2017 1449   MONOABS 1.2 (H) 05/16/2018 0539   MONOABS 0.5 08/16/2017 1449   EOSABS 0.2 05/16/2018 0539   EOSABS 0.1 08/16/2017 1449   BASOSABS 0.0 05/16/2018 0539   BASOSABS 0.0 08/16/2017 1449   HEPATIC Function Panel Recent Labs    05/08/18 1420 05/09/18 0420 05/19/18 0332  PROT 5.9* 5.3* 5.5*   HEMOGLOBIN A1C No components found for: HGA1C,  MPG CARDIAC ENZYMES Lab Results  Component Value Date   TROPONINI <0.03 05/19/2018   TROPONINI 0.03 (HH) 05/18/2018   TROPONINI 0.04 (HH) 05/18/2018   BNP No results for input(s): PROBNP in the last 8760 hours. TSH Recent Labs    05/13/18 0314  TSH 7.152*   CHOLESTEROL No results for input(s): CHOL in the last 8760 hours.  Scheduled Meds: .  acetaminophen (TYLENOL) oral liquid 160 mg/5 mL  500 mg Oral Q8H  . amoxicillin-clavulanate  500 mg Oral BID  . apixaban  2.5 mg Oral BID  . diltiazem  60 mg Oral Q8H  . feeding supplement (ENSURE ENLIVE)  237 mL Oral BID BM  . furosemide  20 mg Intravenous Q12H  . levothyroxine  50 mcg Oral QAC breakfast  . metoprolol tartrate  12.5 mg Oral BID  . pantoprazole sodium  40 mg Oral Q1200  . polyethylene glycol  17 g Oral Daily  . simethicone  80 mg Oral QID  . sodium chloride flush  5 mL Intracatheter Q8H   Continuous Infusions: . sodium chloride Stopped (05/09/18 1709)  . sodium chloride Stopped (05/15/18 1751)   PRN Meds:.sodium chloride, ALPRAZolam, hydrocortisone, morphine injection, ondansetron **OR** ondansetron (ZOFRAN) IV  Assessment/Plan: Atrial fibrillation, CHA2DS2VASc score of 4  S/P acute cholecystitis S/P perc cholecystestomy Severe protein calorie malnutrition Generalized anxiety Dysphagia Hypothyroidism Hypoalbuminemia  Continue medical treatment   LOS: 15 days    Dixie Dials  MD  05/23/2018, 1:55 PM

## 2018-05-23 NOTE — Progress Notes (Signed)
Physical Therapy Treatment Patient Details Name: Summer Jones MRN: [id number] DOB: 08/07/1930 Today's Date: 05/23/2018    History of Present Illness 84 year old female with history of gastric outlet obstruction, history of CBD obstruction status post biliary stents, pancreatic cancer with possible liver mets, hypertension presented to the ED 05/08/18  with complaints of worsening abdominal pain.,  distention, nausea, poor p.o. intake.  S/P  cholecystostomy placed 05/09/18, new onsety A fib.     PT Comments    The patient is weaker than previous PT encounter. Has had medical issues preventing PT. Today patient required min assist for transfers to [facility] then to recliner. Patient should progress to return to prior  Functional level with rehab. . Continue PT. Recommend OT if remains in hospital.   Follow Up Recommendations  SNF;Supervision for mobility/OOB     Equipment Recommendations  None recommended by PT    Recommendations for Other Services       Precautions / Restrictions Precautions Precautions: Fall Precaution Comments: right jtube  BP runs low.   Mobility  Bed Mobility Overal bed mobility: Needs Assistance Bed Mobility: Supine to Sit     Supine to sit: Min assist     General bed mobility comments: HOB raised, min assist for trunk to upright  Transfers Overall transfer level: Needs assistance Equipment used: 1 person hand held assist Transfers: Sit to/from [facility] Sit to Stand: Min assist Stand pivot transfers: Mod assist       General transfer comment: patient supporting on bed and BSC armrest. Able to pivot to [facility] then ot Recliner with 1 HHA. BP taken but patient moving arm and read  abnormally high.   Ambulation/Gait                 Stairs             Wheelchair Mobility    Modified Rankin (Stroke Patients Only)       Balance Overall balance assessment: Needs assistance Sitting-balance support: Feet  supported Sitting balance-Leahy Scale: Good     Standing balance support: During functional activity;Single extremity supported Standing balance-Leahy Scale: Poor Standing balance comment: relies on at least 1 UE support.                            Cognition Arousal/Alertness: Awake/alert                                     General Comments: expresses feeling downabout current mobility status, not being independent      Exercises      General Comments        Pertinent Vitals/Pain Pain Location: abd Pain Descriptors / Indicators: Discomfort;Cramping Pain Intervention(s): Monitored during session;Limited activity within patient's tolerance;Repositioned    Home Living                      Prior Function            PT Goals (current goals can now be found in the care plan section) Progress towards PT goals: Progressing toward goals    Frequency    Min 2X/week      PT Plan Current plan remains appropriate    Co-evaluation              AM-PAC PT "6 Clicks" Daily Activity  Outcome Measure  Difficulty turning over  in bed (including adjusting bedclothes, sheets and blankets)?: A Lot Difficulty moving from lying on back to sitting on the side of the bed? : A Lot Difficulty sitting down on and standing up from a chair with arms (e.g., wheelchair, bedside commode, etc,.)?: Unable Help needed moving to and from a bed to chair (including a wheelchair)?: A Lot Help needed walking in hospital room?: A Lot Help needed climbing 3-5 steps with a railing? : Total 6 Click Score: 10    End of Session   Activity Tolerance: Patient tolerated treatment well Patient left: in chair;with call bell/phone within reach;with family/visitor present Nurse Communication: Mobility status PT Visit Diagnosis: Unsteadiness on feet (R26.81);Difficulty in walking, not elsewhere classified (R26.2)     Time: 1595-3967 PT Time Calculation (min)  (ACUTE ONLY): 35 min  Charges:  $Therapeutic Activity: 23-37 mins                     Tresa Endo PT Acute Rehabilitation Services Pager [phone] Office [phone]    Claretha Cooper 05/23/2018, 9:08 AM

## 2018-05-23 NOTE — Progress Notes (Signed)
Nutrition Follow-up  DOCUMENTATION CODES:   Non-severe (moderate) malnutrition in context of chronic illness, Underweight  INTERVENTION:  - Continue Ensure Enlive BID. - Continue to encourage PO intakes.   NUTRITION DIAGNOSIS:   Moderate Malnutrition related to chronic illness, cancer and cancer related treatments as evidenced by energy intake < or equal to 75% for > or equal to 1 month, severe fat depletion, moderate muscle depletion. -ongoing  GOAL:   Patient will meet greater than or equal to 90% of their needs -minimally met on average.   MONITOR:   PO intake, Supplement acceptance, Weight trends, Labs  ASSESSMENT:   84 year old female with history of gastric outlet obstruction status post gastro-jejunostomy on 11/2017, history of CBD obstruction status post biliary stents, pancreatic cancer with possible liver mets, hypertension presented to the ED with complaints of worsening RUQ abdominal pain, with associated distention, nausea, poor p.o. intake for the past 3 days.   Patient has not been weighed since the day of admission (9/22). She was last seen by a RD on 10/1 and has been eating 50-75% of meals since that time. She has been consuming Ensure supplements ~50% of the time offered. Discharge order for SNF in from yesterday. Spoke with RN who reports plan is for patient to go to SNF later today.    Medications reviewed; 20 mg IV Lasix BID, 50 mcg oral Synthroid/day, 1 packet Miralax/day, 80 mg Mylicon QID. Labs reviewed; Na: 129 mmol/L, Cl: 92 mmol/L, creatinine: 0.42 mg/dL, Ca: 7.9 mg/dL.    Diet Order:   Diet Order            Diet - low sodium heart healthy        Diet - low sodium heart healthy        Diet regular Room service appropriate? Yes; Fluid consistency: Thin  Diet effective now              EDUCATION NEEDS:   Education needs have been addressed  Skin:  Skin Assessment: Reviewed RN Assessment  Last BM:  10/6  Height:   Ht Readings from  Last 1 Encounters:  05/08/18 4' 11.49" (1.511 m)    Weight:   Wt Readings from Last 1 Encounters:  05/08/18 35.7 kg    Ideal Body Weight:  44.7 kg  BMI:  Body mass index is 15.66 kg/m.  Estimated Nutritional Needs:   Kcal:  1350-1550  Protein:  60-70g  Fluid:  1.5L/day     Jarome Matin, MS, RD, LDN, CNSC Inpatient Clinical Dietitian Pager # [phone] After hours/weekend pager # [phone]

## 2018-05-23 NOTE — Progress Notes (Addendum)
[facility] initatied insurance authorization 10/6. The Insurance Authorization is still pending at this time.   Per nurse- patient medically ready for discharge.   CSW will inform medical staff when the insurance authorization is received.   3:00pm CSW updated the patient and her son at bedside.   Kathrin Greathouse, Marlinda Mike, MSW Clinical Social Worker  [phone] 05/23/2018  12:10 PM

## 2018-05-23 NOTE — Progress Notes (Signed)
[facility]  PROGRESS NOTE  Summer Jones WGN:[id number] DOB: 11-04-1929 DOA: 05/08/2018 PCP: Crist Infante, MD   Brief HPI:    85 WF history of gastric outlet obstruction-- gastro-jejunostomy on 11/2017,  history of CBD obstruction status post biliary stents,  pancreatic cancer with possible liver mets,  hypertension  Admit 9/22 RUQ abdominal pain, with associated distention, nausea, poor p.o. intake for the past 3 days.   In the ED, CT abdomen and ultrasound showed possible acute cholecystitis, small amount of ascites, with known pancreatic cancer with mets to the liver. Patient admitted for further management.  Patient also developed new onset A. fib in the hospital, started on Eliquis, metoprolol and Cardizem.  Patient developed bradycardia due to medications required transfer to stepdown unit.  Also found to be hyperkalemic.  Potassium supplementation was stopped.  At this time Cardizem has been started at low-dose per cardiology.  Subjective   Patient seen and examined, she was discharged yesterday.  Awaiting discharge to skilled facility pending insurance authorization.  Complains of lower extremity edema and vaginal swelling.   Assessment/Plan:     1. Bradycardia-resolved, secondary to combination of Cardizem 120 mg twice a day, metoprolol 25 mg twice a day.  Also patient was hyperkalemic with potassium of 6.3.  At this time, bradycardia has resolved.   2. New onset atrial fibrillation-patient is on anticoagulation with Eliquis, cardiology has changed Cardizem to 60 mg 3 times a day also added Lopressor 12.5 mg p.o. twice daily.  We will continue to monitor for next 24 hours.  If stable likely can be discharged in a.m.  3. Hyperkalemia-resolved, yesterday patient's potassium was 6.3, she was getting p.o. supplementation.  Received calcium gluconate, 1 ampule of sodium bicarbonate, at this time potassium is 4.4.    4. Acute cholecystitis status post percutaneous  cholecystostomy on 05/09/2018 by IR-patient is afebrile, surgical deep wound showed Klebsiella and viridans streptococci.  Patient is currently on a p.o. Augmentin.  Earlier CT abdomen showed persistent gallbladder dilatation suggesting cholecystitis, general surgery was consulted, patient was noted to be a poor surgical candidate and status post percutaneous cholecystostomy drain.  Patient to follow-up with IR as outpatient.  Initially she was started on IV Zosyn which has been changed to Augmentin.  For total 2 weeks of therapy stop date on 05/21/2018.  5. Metastatic pancreatic cancer with mets to liver-plan was to start palliative radiation 05/09/2018, follow up with oncology as outpatient.  6. Hypertension-amlodipine and lisinopril have been discontinued due to soft BP and also patient started on p.o. Cardizem.  7. Hyponatremia-slowly improving, today sodium is 129.  8. Anasarca-we will start patient on Lasix 20 mg IV every 12 hours.  Might have to send her on Lasix 20 mg p.o. daily     CBG: Recent Labs  Lab 05/18/18 1943  GLUCAP 156*    CBC: Recent Labs  Lab 05/19/18 0332  WBC 16.6*  HGB 10.2*  HCT 31.3*  MCV 89.9  PLT 409*    Basic Metabolic Panel: Recent Labs  Lab 05/18/18 1419 05/18/18 2126 05/19/18 0332 05/20/18 0634 05/21/18 1344 05/22/18 0349  NA 125* 127* 129* 128* 132* 129*  K 6.3* 5.9* 5.3* 4.4 4.3 4.3  CL 90* 92* 95* 91* 90* 92*  CO2 27 27 23 29  34* 31  GLUCOSE 189* 153* 108* 129* 147* 128*  BUN 10 12 10 9 8 8   CREATININE 0.56 0.53 0.44 0.40* 0.42* 0.42*  CALCIUM 8.3* 8.5* 8.3* 8.3* 8.3* 7.9*  MG 1.8  --   --   --  1.9  --      DVT prophylaxis: Apixaban  Code Status: DNR  Family Communication: Discussed with patient's husband at bedside  Disposition Plan: likely skilled nursing facility in next 24 to 48 hours   Consultants:  Cardiology  General surgery  Procedures:  None   Antibiotics:   Anti-infectives (From admission, onward)    Start     Dose/Rate Route Frequency Ordered Stop   05/18/18 0000  amoxicillin-clavulanate (AUGMENTIN) 500-125 MG tablet  Status:  Discontinued     500 mg Oral 2 times daily 05/18/18 1228 05/22/18    05/13/18 2200  amoxicillin-clavulanate (AUGMENTIN) 500-125 MG per tablet 500 mg     500 mg Oral 2 times daily 05/13/18 1556     05/12/18 1415  amoxicillin-clavulanate (AUGMENTIN) 875-125 MG per tablet 1 tablet  Status:  Discontinued     1 tablet Oral Every 12 hours 05/12/18 1402 05/13/18 1556   05/09/18 0200  piperacillin-tazobactam (ZOSYN) IVPB 3.375 g  Status:  Discontinued     3.375 g 12.5 mL/hr over 240 Minutes Intravenous Every 8 hours 05/08/18 1915 05/12/18 1402   05/08/18 1845  piperacillin-tazobactam (ZOSYN) IVPB 3.375 g     3.375 g 100 mL/hr over 30 Minutes Intravenous  Once 05/08/18 1833 05/08/18 1908       Objective   Vitals:   05/23/18 0900 05/23/18 1000 05/23/18 1100 05/23/18 1153  BP:  (!) 104/55    Pulse: 78 88 82   Resp: (!) 31 (!) 21 (!) 24   Temp:    (!) 96.8 F (36 C)  TempSrc:    Axillary  SpO2: 95% 98% 91%   Weight:      Height:        Intake/Output Summary (Last 24 hours) at 05/23/2018 1205 Last data filed at 05/22/2018 2305 Gross per 24 hour  Intake 70 ml  Output 375 ml  Net -305 ml   Filed Weights   05/08/18 1335  Weight: 35.7 kg     Physical Examination:   Neck: Supple, no deformities, masses, or tenderness Lungs: Normal respiratory effort, bilateral clear to auscultation, no crackles or wheezes.  Heart: Irregular rhythm, no murmurs auscultated Abdomen: BS normoactive,soft,nondistended,non-tender to palpation,no organomegaly Extremities: Bilateral 2+  pitting edema of the lower extremities  Neuro : Alert and oriented to time, place and person, No focal deficits Skin: No rashes seen on exam    Data Reviewed: I have personally reviewed following labs and imaging studies   Recent Results (from the past 240 hour(s))  Aerobic Culture  (superficial specimen)     Status: Abnormal   Collection Time: 05/20/18 11:36 AM  Result Value Ref Range Status   Specimen Description   Final    BILE Performed at [facility] [address], [place], [place] [address]    Special Requests   Final    NONE Performed at [facility], [place] [address] [place], [place] [address]    Gram Stain   Final    RARE WBC PRESENT, PREDOMINANTLY PMN RARE GRAM VARIABLE ROD Performed at [facility], [place] [address], [place], [place] [address]    Culture MULTIPLE ORGANISMS PRESENT, NONE PREDOMINANT (A)  Final   Report Status 05/22/2018 FINAL  Final     Liver Function Tests: Recent Labs  Lab 05/19/18 0332  AST 35  ALT 23  ALKPHOS 199*  BILITOT 0.5  PROT 5.5*  ALBUMIN 2.2*   No results for input(s): LIPASE, AMYLASE in the  last 168 hours. No results for input(s): AMMONIA in the last 168 hours.  Cardiac Enzymes: Recent Labs  Lab 05/18/18 1419 05/18/18 2126 05/19/18 0332  TROPONINI 0.04* 0.03* <0.03   BNP (last 3 results) No results for input(s): BNP in the last 8760 hours.  ProBNP (last 3 results) No results for input(s): PROBNP in the last 8760 hours.    Studies: No results found.  Scheduled Meds: . acetaminophen (TYLENOL) oral liquid 160 mg/5 mL  500 mg Oral Q8H  . amoxicillin-clavulanate  500 mg Oral BID  . apixaban  2.5 mg Oral BID  . diltiazem  60 mg Oral Q8H  . feeding supplement (ENSURE ENLIVE)  237 mL Oral BID BM  . furosemide  20 mg Intravenous Q12H  . levothyroxine  50 mcg Oral QAC breakfast  . metoprolol tartrate  12.5 mg Oral BID  . pantoprazole sodium  40 mg Oral Q1200  . polyethylene glycol  17 g Oral Daily  . simethicone  80 mg Oral QID  . sodium chloride flush  5 mL Intracatheter Q8H      Time spent: 25 min  [facility] Pager [phone]. If 7PM-7AM, please contact night-coverage at [email or web address], Office  [phone]  password  [email or web address]  05/23/2018, 12:05 PM  LOS: 15 days

## 2018-05-23 NOTE — Care Management Note (Signed)
Case Management Note  Patient Details  Name: Summer Jones MRN: [id number] Date of Birth: 1930/03/08  Subjective/Objective:                  Discharged to [facility]  Action/Plan: nO CM NEEDS PRESENT AT THIS TIME.  Expected Discharge Date:  05/22/18               Expected Discharge Plan:  Skilled Nursing Facility  In-House Referral:  Clinical Social Work  Discharge planning Services  CM Consult  Post Acute Care Choice:    Choice offered to:     DME Arranged:    DME Agency:     HH Arranged:    [place] Agency:     Status of Service:  Completed, signed off  If discussed at [employer], dates discussed:    Additional Comments:  Leeroy Cha, RN 05/23/2018, 9:05 AM

## 2018-05-24 ENCOUNTER — Ambulatory Visit: Payer: Medicare Other

## 2018-05-24 ENCOUNTER — Ambulatory Visit: Payer: Medicare Other | Admitting: Radiation Oncology

## 2018-05-24 LAB — BASIC METABOLIC PANEL
Anion gap: 8 (ref 5–15)
BUN: 9 mg/dL (ref 8–23)
CO2: 34 mmol/L — ABNORMAL HIGH (ref 22–32)
CREATININE: 0.46 mg/dL (ref 0.44–1.00)
Calcium: 8.3 mg/dL — ABNORMAL LOW (ref 8.9–10.3)
Chloride: 94 mmol/L — ABNORMAL LOW (ref 98–111)
GFR calc Af Amer: >60 mL/min (ref >60)
GLUCOSE: 118 mg/dL — AB (ref 70–99)
Potassium: 3.9 mmol/L (ref 3.5–5.1)
SODIUM: 136 mmol/L (ref 135–145)

## 2018-05-24 MED ORDER — DILTIAZEM HCL 60 MG PO TABS: 60.0000 mg | ORAL_TABLET | Freq: Two times a day (BID) | ORAL | Status: AC

## 2018-05-24 MED ORDER — FUROSEMIDE 20 MG PO TABS: 20.0000 mg | ORAL_TABLET | ORAL | Status: DC

## 2018-05-24 MED ORDER — PANTOPRAZOLE SODIUM 40 MG PO TBEC
40.0000 mg | DELAYED_RELEASE_TABLET | Freq: Every day | ORAL | Status: DC
  Administered 2018-05-24: 40 mg via ORAL
  Filled 2018-05-24: qty 1

## 2018-05-24 MED ORDER — FUROSEMIDE 20 MG PO TABS: 20.0000 mg | ORAL_TABLET | ORAL | Status: AC

## 2018-05-24 MED ORDER — METOPROLOL TARTRATE 25 MG PO TABS: 12.5000 mg | ORAL_TABLET | Freq: Every day | ORAL | Status: AC

## 2018-05-24 MED ORDER — METOPROLOL TARTRATE 12.5 MG HALF TABLET: 12.5000 mg | ORAL_TABLET | Freq: Every day | ORAL | Status: DC

## 2018-05-24 MED ORDER — DILTIAZEM HCL 60 MG PO TABS: 60.0000 mg | ORAL_TABLET | Freq: Two times a day (BID) | ORAL | Status: DC

## 2018-05-24 NOTE — Progress Notes (Addendum)
[facility]  PROGRESS NOTE  Summer Jones GEX:[id number] DOB: 01-02-1930 DOA: 05/08/2018 PCP: Crist Infante, MD   Brief HPI:    30 WF history of gastric outlet obstruction-- gastro-jejunostomy on 11/2017,  history of CBD obstruction status post biliary stents,  pancreatic cancer with possible liver mets,  hypertension  Admit 9/22 RUQ abdominal pain, with associated distention, nausea, poor p.o. intake for the past 3 days.   In the ED, CT abdomen and ultrasound showed possible acute cholecystitis, small amount of ascites, with known pancreatic cancer with mets to the liver. Patient admitted for further management.  Patient also developed new onset A. fib in the hospital, started on Eliquis, metoprolol and Cardizem.  Patient developed bradycardia due to medications required transfer to stepdown unit.  Also found to be hyperkalemic.  Potassium supplementation was stopped.  At this time Cardizem has been started at low-dose per cardiology.  Subjective   Patient seen and examined, heart rate is controlled.  Dose of Cardizem and metoprolol has been changed per cardiology.  Good diuresis with IV Lasix.   Assessment/Plan:     1. Bradycardia-resolved, secondary to combination of Cardizem 120 mg twice a day, metoprolol 25 mg twice a day.  Also patient was hyperkalemic with potassium of 6.3.  At this time, bradycardia has resolved.   2. New onset atrial fibrillation-patient is on anticoagulation with Eliquis, cardiology has changed Cardizem to 60 mg 2 times a day also added Lopressor 12.5 mg p.o. once daily.  We will continue to monitor for next 24 hours.  If stable likely can be discharged in a.m.  3. Hyperkalemia-resolved, yesterday patient's potassium was 6.3, she was getting p.o. supplementation.  Received calcium gluconate, 1 ampule of sodium bicarbonate, at this time potassium is 4.4.    4. Acute cholecystitis status post percutaneous cholecystostomy on 05/09/2018 by IR-patient is  afebrile, surgical deep wound showed Klebsiella and viridans streptococci.  Patient is currently on a p.o. Augmentin.  Earlier CT abdomen showed persistent gallbladder dilatation suggesting cholecystitis, general surgery was consulted, patient was noted to be a poor surgical candidate and status post percutaneous cholecystostomy drain.  Patient to follow-up with IR as outpatient.  Initially she was started on IV Zosyn which has been changed to Augmentin.  Patient completed 2 weeks of therapy stopped on 05/21/2018.  5. Metastatic pancreatic cancer with mets to liver-plan was to start palliative radiation 05/09/2018, follow up with oncology as outpatient.  6. Hypertension-amlodipine and lisinopril have been discontinued due to soft BP and also patient started on p.o. Cardizem.  7. Hyponatremia-slowly improving, today sodium is 136  8. Anasarca-improved, patient was started on  Lasix 20 mg IV every 12 hours.  Discussed with cardiology, will change Lasix to 20 mg p.o. 3 times a week.       CBG: Recent Labs  Lab 05/18/18 1943  GLUCAP 156*    CBC: Recent Labs  Lab 05/19/18 0332  WBC 16.6*  HGB 10.2*  HCT 31.3*  MCV 89.9  PLT 409*    Basic Metabolic Panel: Recent Labs  Lab 05/18/18 1419  05/19/18 0332 05/20/18 0634 05/21/18 1344 05/22/18 0349 05/24/18 0354  NA 125*   < > 129* 128* 132* 129* 136  K 6.3*   < > 5.3* 4.4 4.3 4.3 3.9  CL 90*   < > 95* 91* 90* 92* 94*  CO2 27   < > 23 29 34* 31 34*  GLUCOSE 189*   < > 108* 129* 147* 128* 118*  BUN  10   < > 10 9 8 8 9   CREATININE 0.56   < > 0.44 0.40* 0.42* 0.42* 0.46  CALCIUM 8.3*   < > 8.3* 8.3* 8.3* 7.9* 8.3*  MG 1.8  --   --   --  1.9  --   --    < > = values in this interval not displayed.     DVT prophylaxis: Apixaban  Code Status: DNR  Family Communication: Discussed with patient's husband at bedside  Disposition Plan: likely skilled nursing facility in next 24 to 48 hours   Consultants:  Cardiology  General  surgery  Procedures:  None   Antibiotics:   Anti-infectives (From admission, onward)   Start     Dose/Rate Route Frequency Ordered Stop   05/18/18 0000  amoxicillin-clavulanate (AUGMENTIN) 500-125 MG tablet  Status:  Discontinued     500 mg Oral 2 times daily 05/18/18 1228 05/22/18    05/13/18 2200  amoxicillin-clavulanate (AUGMENTIN) 500-125 MG per tablet 500 mg     500 mg Oral 2 times daily 05/13/18 1556     05/12/18 1415  amoxicillin-clavulanate (AUGMENTIN) 875-125 MG per tablet 1 tablet  Status:  Discontinued     1 tablet Oral Every 12 hours 05/12/18 1402 05/13/18 1556   05/09/18 0200  piperacillin-tazobactam (ZOSYN) IVPB 3.375 g  Status:  Discontinued     3.375 g 12.5 mL/hr over 240 Minutes Intravenous Every 8 hours 05/08/18 1915 05/12/18 1402   05/08/18 1845  piperacillin-tazobactam (ZOSYN) IVPB 3.375 g     3.375 g 100 mL/hr over 30 Minutes Intravenous  Once 05/08/18 1833 05/08/18 1908       Objective   Vitals:   05/24/18 0700 05/24/18 0800 05/24/18 0815 05/24/18 1120  BP:  (!) 99/46    Pulse: 64 72    Resp: 16 15    Temp:   97.6 F (36.4 C) (!) 97.5 F (36.4 C)  TempSrc:   Oral Oral  SpO2: 100% 100%    Weight:      Height:        Intake/Output Summary (Last 24 hours) at 05/24/2018 1211 Last data filed at 05/24/2018 2671 Gross per 24 hour  Intake 15 ml  Output 275 ml  Net -260 ml   Filed Weights   05/08/18 1335  Weight: 35.7 kg     Physical Examination:   Mouth: Oral mucosa is moist, no lesions on palate,  Neck: Supple, no deformities, masses, or tenderness Lungs: Normal respiratory effort, bilateral clear to auscultation, no crackles or wheezes.  Heart: Regular rate and rhythm, S1 and S2 normal, no murmurs, rubs auscultated Abdomen: BS normoactive,soft,nondistended,non-tender to palpation,no organomegaly Extremities: Trace pretibial edema bilaterally, no erythema, no cyanosis, no clubbing Neuro : Alert and oriented to time, place and person, No  focal deficits     Data Reviewed: I have personally reviewed following labs and imaging studies   Recent Results (from the past 240 hour(s))  Aerobic Culture (superficial specimen)     Status: Abnormal   Collection Time: 05/20/18 11:36 AM  Result Value Ref Range Status   Specimen Description   Final    BILE Performed at [facility] [address] [place], [place] [address]    Special Requests   Final    NONE Performed at [facility], [place] [address], [place],  [address]    Gram Stain   Final    RARE WBC PRESENT, PREDOMINANTLY PMN RARE GRAM VARIABLE ROD Performed at  [facility], [place] [address] [place], [place] [address]    Culture MULTIPLE ORGANISMS PRESENT, NONE PREDOMINANT (A)  Final   Report Status 05/22/2018 FINAL  Final     Liver Function Tests: Recent Labs  Lab 05/19/18 0332  AST 35  ALT 23  ALKPHOS 199*  BILITOT 0.5  PROT 5.5*  ALBUMIN 2.2*   No results for input(s): LIPASE, AMYLASE in the last 168 hours. No results for input(s): AMMONIA in the last 168 hours.  Cardiac Enzymes: Recent Labs  Lab 05/18/18 1419 05/18/18 2126 05/19/18 0332  TROPONINI 0.04* 0.03* <0.03   BNP (last 3 results) No results for input(s): BNP in the last 8760 hours.  ProBNP (last 3 results) No results for input(s): PROBNP in the last 8760 hours.    Studies: No results found.  Scheduled Meds: . acetaminophen (TYLENOL) oral liquid 160 mg/5 mL  500 mg Oral Q8H  . amoxicillin-clavulanate  500 mg Oral BID  . apixaban  2.5 mg Oral BID  . diltiazem  60 mg Oral Q12H  . feeding supplement (ENSURE ENLIVE)  237 mL Oral BID BM  . [START ON 05/25/2018] furosemide  20 mg Oral Q M,W,F  . levothyroxine  50 mcg Oral QAC breakfast  . [START ON 05/25/2018] metoprolol tartrate  12.5 mg Oral Daily  . pantoprazole  40 mg Oral Daily  . polyethylene glycol  17 g Oral Daily  . simethicone  80 mg Oral QID  . sodium chloride flush  5 mL  Intracatheter Q8H      Time spent: 25 min  [facility] Pager [phone]. If 7PM-7AM, please contact night-coverage at [email or web address], Office  [phone]  password [place]  05/24/2018, 12:11 PM  LOS: 16 days

## 2018-05-24 NOTE — Consult Note (Signed)
Ref: Crist Infante, MD   Subjective:  Monitor shows sinus thythm. HR in 80's. Afebrile. Mild respiratory distress continues.  Objective:  Vital Signs in the last 24 hours: Temp:  [96.8 F (36 C)-98 F (36.7 C)] 97.6 F (36.4 C) (10/08 0815) Pulse Rate:  [57-135] 72 (10/08 0800) Cardiac Rhythm: Atrial fibrillation (10/08 0800) Resp:  [13-25] 15 (10/08 0800) BP: (86-140)/(38-65) 99/46 (10/08 0800) SpO2:  [91 %-100 %] 100 % (10/08 0800)  Physical Exam: BP Readings from Last 1 Encounters:  05/24/18 (!) 99/46     Wt Readings from Last 1 Encounters:  05/08/18 35.7 kg    Weight change:  Body mass index is 15.66 kg/m. HEENT: [place]/AT, Eyes-Blue, PERL, EOMI, Conjunctiva-Pink, Sclera-Non-icteric Neck: No JVD, No bruit, Trachea midline. Lungs:  Clear, Bilateral. Cardiac:  Regular rhythm, normal S1 and S2, no S3. II/VI systolic murmur. Abdomen:  Soft, non-tender. BS present. Extremities:  1 + edema present. No cyanosis. No clubbing. CNS: AxOx3, Cranial nerves grossly intact, moves all 4 extremities.  Skin: Warm and dry.   Intake/Output from previous day: 10/07 0701 - 10/08 0700 In: 15 [I.V.:10] Out: 275 [Urine:275]    Lab Results: BMET    Component Value Date/Time   NA 136 05/24/2018 0354   NA 129 (L) 05/22/2018 0349   NA 132 (L) 05/21/2018 1344   NA 137 08/16/2017 1449   NA 135 (L) 06/28/2017 1427   K 3.9 05/24/2018 0354   K 4.3 05/22/2018 0349   K 4.3 05/21/2018 1344   K 4.0 08/16/2017 1449   K 4.0 06/28/2017 1427   CL 94 (L) 05/24/2018 0354   CL 92 (L) 05/22/2018 0349   CL 90 (L) 05/21/2018 1344   CO2 34 (H) 05/24/2018 0354   CO2 31 05/22/2018 0349   CO2 34 (H) 05/21/2018 1344   CO2 27 08/16/2017 1449   CO2 25 06/28/2017 1427   GLUCOSE 118 (H) 05/24/2018 0354   GLUCOSE 128 (H) 05/22/2018 0349   GLUCOSE 147 (H) 05/21/2018 1344   GLUCOSE 114 08/16/2017 1449   GLUCOSE 103 06/28/2017 1427   BUN 9 05/24/2018 0354   BUN 8 05/22/2018 0349   BUN 8 05/21/2018 1344    BUN 8.6 08/16/2017 1449   BUN 8.7 06/28/2017 1427   CREATININE 0.46 05/24/2018 0354   CREATININE 0.42 (L) 05/22/2018 0349   CREATININE 0.42 (L) 05/21/2018 1344   CREATININE 0.71 10/19/2017 1142   CREATININE 0.8 08/16/2017 1449   CREATININE 0.7 06/28/2017 1427   CALCIUM 8.3 (L) 05/24/2018 0354   CALCIUM 7.9 (L) 05/22/2018 0349   CALCIUM 8.3 (L) 05/21/2018 1344   CALCIUM 9.5 08/16/2017 1449   CALCIUM 9.4 06/28/2017 1427   GFRNONAA >60 05/24/2018 0354   GFRNONAA >60 05/22/2018 0349   GFRNONAA >60 05/21/2018 1344   GFRNONAA >60 10/19/2017 1142   GFRAA >60 05/24/2018 0354   GFRAA >60 05/22/2018 0349   GFRAA >60 05/21/2018 1344   GFRAA >60 10/19/2017 1142   CBC    Component Value Date/Time   WBC 16.6 (H) 05/19/2018 0332   RBC 3.48 (L) 05/19/2018 0332   HGB 10.2 (L) 05/19/2018 0332   HGB 12.1 10/19/2017 1142   HGB 12.4 08/16/2017 1449   HCT 31.3 (L) 05/19/2018 0332   HCT 38.1 08/16/2017 1449   PLT 409 (H) 05/19/2018 0332   PLT 284 10/19/2017 1142   PLT 258 08/16/2017 1449   MCV 89.9 05/19/2018 0332   MCV 93.3 08/16/2017 1449   MCH 29.3 05/19/2018 0332  MCHC 32.6 05/19/2018 0332   RDW 16.7 (H) 05/19/2018 0332   RDW 13.8 08/16/2017 1449   LYMPHSABS 1.1 05/16/2018 0539   LYMPHSABS 0.9 08/16/2017 1449   MONOABS 1.2 (H) 05/16/2018 0539   MONOABS 0.5 08/16/2017 1449   EOSABS 0.2 05/16/2018 0539   EOSABS 0.1 08/16/2017 1449   BASOSABS 0.0 05/16/2018 0539   BASOSABS 0.0 08/16/2017 1449   HEPATIC Function Panel Recent Labs    05/08/18 1420 05/09/18 0420 05/19/18 0332  PROT 5.9* 5.3* 5.5*   HEMOGLOBIN A1C No components found for: HGA1C,  MPG CARDIAC ENZYMES Lab Results  Component Value Date   TROPONINI <0.03 05/19/2018   TROPONINI 0.03 (HH) 05/18/2018   TROPONINI 0.04 (HH) 05/18/2018   BNP No results for input(s): PROBNP in the last 8760 hours. TSH Recent Labs    05/13/18 0314  TSH 7.152*   CHOLESTEROL No results for input(s): CHOL in the last 8760  hours.  Scheduled Meds: . acetaminophen (TYLENOL) oral liquid 160 mg/5 mL  500 mg Oral Q8H  . amoxicillin-clavulanate  500 mg Oral BID  . apixaban  2.5 mg Oral BID  . diltiazem  60 mg Oral Q12H  . feeding supplement (ENSURE ENLIVE)  237 mL Oral BID BM  . furosemide  20 mg Intravenous Q12H  . levothyroxine  50 mcg Oral QAC breakfast  . [START ON 05/25/2018] metoprolol tartrate  12.5 mg Oral Daily  . pantoprazole  40 mg Oral Daily  . polyethylene glycol  17 g Oral Daily  . simethicone  80 mg Oral QID  . sodium chloride flush  5 mL Intracatheter Q8H   Continuous Infusions: . sodium chloride Stopped (05/09/18 1709)  . sodium chloride Stopped (05/15/18 1751)   PRN Meds:.sodium chloride, ALPRAZolam, hydrocortisone, morphine injection, ondansetron **OR** ondansetron (ZOFRAN) IV  Assessment/Plan: Paroxysmal atrial fibrillation S/P acute cholecystitis S/P perc cholecystostomy Hypothyroidism Generalized anxiety Severe protein calorie malnutrition Dysphagia  Decrease evening dose of B-blocker and diltiazem.   LOS: 16 days    Dixie Dials  MD  05/24/2018, 10:58 AM

## 2018-05-24 NOTE — Progress Notes (Signed)
Held PM Diltiazem and Metoprolol Tartrate per verbal order from Caridiology Dr. Doylene Canard. Also noted to call physician if HR >90. RN will continue to monitor.

## 2018-05-24 NOTE — Clinical Social Work Placement (Addendum)
D/C Summary sent.  2:15 pm [facility] arranged for transport.  Nurse given the number to call report.   CLINICAL SOCIAL WORK PLACEMENT  NOTE  Date:  05/24/2018  Patient Details  Name: Summer Jones MRN: [id number] Date of Birth: July 01, 1930  Clinical Social Work is seeking post-discharge placement for this patient at the [place] level of care (*CSW will initial, date and re-position this form in  chart as items are completed):  Yes   Patient/family provided with [facility] Work Department's list of facilities offering this level of care within the geographic area requested by the patient (or if unable, by the patient's family).  Yes   Patient/family informed of their freedom to choose among providers that offer the needed level of care, that participate in Medicare, Medicaid or managed care program needed by the patient, have an available bed and are willing to accept the patient.  Yes   Patient/family informed of [facility]'s ownership interest in [facility] and [facility], as well as of the fact that they are under no obligation to receive care at these facilities.  PASRR submitted to EDS on 05/12/18     PASRR number received on 05/12/18     Existing PASRR number confirmed on       FL2 transmitted to all facilities in geographic area requested by pt/family on 05/12/18     FL2 transmitted to all facilities within larger geographic area on 05/12/18     Patient informed that his/her managed care company has contracts with or will negotiate with certain facilities, including the following:        Yes   Patient/family informed of bed offers received.  Patient chooses bed at [facility]     Physician recommends and patient chooses bed at      Patient to be transferred to [facility] on 05/24/18.  Patient to be transferred to facility by [facility]     Patient family notified on 05/24/18 of transfer.  Name of family member notified: Son at  bedside.  PHYSICIAN       Additional Comment:    _______________________________________________ Lia Hopping, LCSW 05/24/2018, 4:34 PM

## 2018-05-25 ENCOUNTER — Ambulatory Visit: Payer: Medicare Other | Admitting: Radiation Oncology

## 2018-05-26 ENCOUNTER — Ambulatory Visit: Payer: Medicare Other

## 2018-05-27 ENCOUNTER — Ambulatory Visit: Payer: Medicare Other

## 2018-05-30 ENCOUNTER — Ambulatory Visit: Payer: Medicare Other

## 2018-05-30 ENCOUNTER — Ambulatory Visit: Payer: Medicare Other | Admitting: Radiation Oncology

## 2018-05-30 ENCOUNTER — Ambulatory Visit: Admission: RE | Admit: 2018-05-30 | Payer: Medicare Other | Source: Ambulatory Visit | Admitting: Radiation Oncology

## 2018-05-31 ENCOUNTER — Ambulatory Visit: Payer: Medicare Other

## 2018-05-31 ENCOUNTER — Ambulatory Visit: Payer: Medicare Other | Admitting: Radiation Oncology

## 2018-06-01 ENCOUNTER — Ambulatory Visit: Payer: Medicare Other

## 2018-06-01 ENCOUNTER — Ambulatory Visit: Payer: Medicare Other | Admitting: Radiation Oncology

## 2018-06-02 ENCOUNTER — Ambulatory Visit: Payer: Medicare Other | Admitting: Radiation Oncology

## 2018-06-02 ENCOUNTER — Ambulatory Visit: Payer: Medicare Other

## 2018-06-03 ENCOUNTER — Ambulatory Visit: Payer: Medicare Other

## 2018-06-03 ENCOUNTER — Ambulatory Visit: Payer: Medicare Other | Admitting: Radiation Oncology

## 2018-06-06 ENCOUNTER — Ambulatory Visit: Payer: Medicare Other

## 2018-06-06 ENCOUNTER — Ambulatory Visit: Payer: Medicare Other | Admitting: Radiation Oncology

## 2018-06-06 ENCOUNTER — Telehealth: Payer: Self-pay | Admitting: Radiation Oncology

## 2018-06-06 NOTE — Telephone Encounter (Signed)
I spoke with the patient's son and after reviewing her clinical course, the patient is currently in a facility, continuing to deal with her cholecystotomy tube, and prolonged healing from her cholecystitis, we have decided to end her treatment plan, and reconvene in another week.  She was to of started over a week and a half ago, but her cholecystitis has put a damper on this.

## 2018-06-07 ENCOUNTER — Ambulatory Visit: Payer: Medicare Other

## 2018-06-08 ENCOUNTER — Ambulatory Visit: Payer: Medicare Other

## 2018-06-08 NOTE — Progress Notes (Signed)
[place]  [phone] [phone]) [phone]  Clinic Follow Up Note   Patient Care Team: Crist Infante, MD as PCP - General (Internal Medicine) [name], MD as Attending Physician (Gastroenterology) Truitt Merle, MD as Consulting Physician (Hematology) 06/09/2018  CHIEF COMPLAINTS:  Follow up pancreatic adenocarcinoma   Oncology History   Cancer Staging Pancreatic adenocarcinoma [facility]) Staging form: Exocrine Pancreas, AJCC 8th Edition - Clinical stage from 05/19/2017: Stage IB (cT2, cN0, cM0) - Signed by Truitt Merle, MD on 05/25/2017       Malignant neoplasm of head of pancreas ([place])   08/06/2015 Imaging    CT ABD/PELVIS IMPRESSION: 1. Intra and extrahepatic biliary dilatation with an abrupt cut off of the common bile duct near its entry into the pancreatic head. Findings most suspicious for a biliary tumor (cholangiocarcinoma) but a common bile duct stone and pancreatic lesion are also possible. MRCP may be helpful for further evaluation if the patient can hold her breath. Otherwise, ERCP is suggested for both diagnostic and therapeutic purposes. 2. Moderate distention of the gallbladder without obvious gallstones. 3. No liver lesions or abdominal lymphadenopathy.     08/08/2015 Procedure    EUS and ERCP per Dr. [name]: indicated for painless juandice, dilated biliary tree  Very vaguely abnormal region in the pancreatic head. This was hypoechoic and SOMEWHAT masslike, measured 2cm Maximally.  2. CBD was dilated up to 36m, tapered abruptly in the head of pancreas. 3. Main pancreatic duct was non-dilated 4. No peripancreatic adenopathy.    08/08/2015 Pathology Results    EUS path: Common Bile Duct , bx SMALL FRAGMENT OF BILE DUCT EPITHELIUM, PERIDUCTAL GLANDS AND FIBROMUSCULAR TISSUE NEGATIVE FOR MALIGNANCY    08/08/2015 Procedure    IR biliary stent placement     09/05/2015 Pathology Results    1. Soft tissue, biopsy, common bile  duct - SCANT SOFT TISSUE AND NECROSIS WITH ACUTE INFLAMMATION. - SCANT BENIGN GLANDULAR EPITHLEIUM. - NO MALIGNANCY IDENTIFIED. 2. Soft tissue, biopsy, common bile duct - SOFT TISSUE AND NECROSIS WITH ACUTE INFLAMMATION. - BENIGN PANCREATIC TISSUE. - NO MALIGNANCY IDENTIFIED.    09/05/2015 Imaging    CT ABD/PELVIS IMPRESSION: 1. No acute abnormality seen to explain the patient's symptoms. 2. Scattered pneumobilia is slightly more prominent, status post placement of a common bile duct stent. External biliary drainage catheter noted in expected position, adjacent to the upper aspect of the common bile duct stent. 3. Vague soft tissue edema about the pancreatic head and second segment of duodenum is relatively stable from the prior CT, and likely reflects the recent procedure. 4. Minimal diverticulosis at the cecum, without evidence of diverticulitis. 5. Mild degenerative change along the lumbar spine.     12/26/2015 Procedure    ERCP for stent replacement to bile duct stricture     12/26/2015 Pathology Results    BILE DUCT BRUSHING WITH STENT (SPECIMEN 1 OF 1, COLLECTED ON 12/26/15): NO MALIGNANT CELLS IDENTIFIED.    11/27/2016 Initial Biopsy    Pancreas, biopsy, mass Small focus of atypical glands, suspicious for infiltrating adenocarcinoma    11/27/2016 Procedure    POST-OPERATIVE DIAGNOSIS:  Gastric outlet obstruction  PROCEDURE:   GASTROJEJUNOSTOMY, BIOPSY PANCREATIC HEAD MASS     04/23/2017 Imaging    CT AP 04/23/17 IMPRESSION: 3.5 cm pancreatic head mass surrounding the common bile duct stent, consistent with pancreatic adenocarcinoma.  No evidence of metastatic disease within the abdomen or pelvis.    05/03/2017 Initial Diagnosis    Pancreatic adenocarcinoma ([place]  05/13/2017 Imaging    CT Chest WO Contrast 05/13/17 IMPRESSION: No definite evidence of intrathoracic metastatic disease. Old granulomatous disease. Aortic Atherosclerosis      05/19/2017  Pathology Results    Diagnosis 05/19/17 Pancreas, biopsy - ADENOCARCINOMA.  MMR was normal     11/02/2017 Imaging    CT AP W Contrast  IMPRESSION: 1. Interval increase in size of ill-defined pancreatic head mass consistent with known adenocarcinoma. 2. Interval development of a small perigastric lymph node. Continued close attention on follow-up recommended. 3. Slight increase and mass-effect on the portal splenic confluence by the pancreatic head mass although no vascular complication evident on the current study.    12/14/2017 Imaging    CT AP W Contrast  IMPRESSION: 1.  No acute intra-abdominal process. 2. Relatively unchanged ill-defined pancreatic head mass, consistent with adenocarcinoma. 3.  Prominent stool throughout the colon favors constipation.    03/29/2018 Imaging    IMPRESSION: 1. Masslike soft tissue in the region of the pancreatic head is difficult to measure but subjectively, appears larger. This is supported by increasing biliary ductal, gallbladder and pancreatic ductal dilatation, as well as enlarging peripancreatic adenopathy. Associated vascular encasement and narrowing of the portal vein with gastric varices. 2.  Aortic atherosclerosis (ICD10-170.0).     05/08/2018 - 05/24/2018 Hospital Admission    Admit date: 05/08/2018 Admission diagnosis: Cholecystitis  Additional comments: she was hospitalized on 05/08/2018 for abdominal pain, nausea and poor oral intake of 3 days duration. She was later diagnosed with acute cholecystitis that was treated with cholecystectomy on 05/09/2018. She also developed new onset atrial fibrillation during her hospital stay and was started on Eliquis now. She was later discharged on 05/24/2018.    HISTORY OF PRESENTING ILLNESS:   Summer Jones 84 y.o. female is here because of suspicion for pancreatic adenocarcinoma. She has been under the care of Dr. [name] since 2016. She was referred by Dr. [name], she presents to the clinic with  her husband and 2 sons.  In 2016, constipation and scleral juandice prompted the patient to seek care with her PCP. She was found to have intrahepatic and extrahepatic biliary dilation on CT abd/pelvis on 08/06/2015. A percutaneous drainage tube was placed then later exchanged for biliary stent. Jaundice resolved at that time. The stent was replaced in 12/2015. She was hospitalized in April 2018 for constipation. She had one episode of nausea and vomiting on the way to the ER at that time. Surgical biopsy by Dr. [name] on 11/27/16 showed atypical cells concerning for pancreatic adenocarcinoma but EUS brushings by Dr. [name] showed no malignancy. In 04/2017 she developed bilateral lower abdominal pain and soreness, rating this 8/10, and constipation. She had minimal blood on tissue with wiping and has known hemorrhoids. CT abdomen and pelvis revealed 3.5 pancreatic head mass surrounding the common bile duct stent, consistent with pancreatic adenocarcinoma. For her pain, she takes ibuprofen but does not like to take medication due to chronic constipation. Associated with bloating, distention, and constipation. She uses miralax and prune juice. Last BM 05/04/17 which was small, soft and difficult. No nausea, vomiting, weight loss, or decreased appetite. However, she limits food due to fear of subsequent abdominal pain. She has minimal fatigue. Chronic dry cough from lisinopril.    CURRENT THERAPY: Supportive care, transition to hospice   INTERVAL HISTORY:  Summer Jones is here for a follow up. She was last seen by me 5 months ago. She has been following up with NP Lacie in the interim  and was noted to still have worsening abdominal pain. Unfortunately, she was hospitalized on 05/08/2018 for abdominal pain, nausea and poor oral intake of 3 days duration. She was later diagnosed with acute cholecystitis that was treated with cholecystectomy on 05/09/2018. She also developed new onset atrial fibrillation during her  hospital stay and was started on Eliquis. She was later discharged on 05/24/2018. Today, she is here with her family members. She has a gallbladder tube drain attached. She noticed some leaking at the insertion site almost 3 days ago. Her RUQ pain has now improved on Tramadol. She now has generalized abdominal pain that is worsening compared to 2 months ago. Her abdomen is distended and she states that she eats well and thought that she was gaining weight, but found out her weight gain was just fluid weight. She was given Lactulose. She also noticed SOB and was started on O2 canula during her last hospital stay.      MEDICAL HISTORY:  Past Medical History:  Diagnosis Date  . Arthritis   . Common bile duct (CBD) obstruction   . Difficult intubation Anterior glottis, short TMD, limited mouth opening. Required glidescope and bougie stylet with multiple attempts.  . Diverticular disease   . Diverticulosis   . Essential hypertension   . Hypertension   . Hypothyroidism   . Jaundice   . Mitral valve prolapse   . Pancreatic cancer ([place]) 05/2017  . Thyroid disease    SURGICAL HISTORY: Past Surgical History:  Procedure Laterality Date  . BREAST BIOPSY Right   . BREAST CYST EXCISION Right   . ENDOSCOPIC RETROGRADE CHOLANGIOPANCREATOGRAPHY (ERCP) WITH PROPOFOL N/A 08/08/2015   Procedure: ENDOSCOPIC RETROGRADE CHOLANGIOPANCREATOGRAPHY (ERCP) WITH PROPOFOL;  Surgeon: [name], MD;  Location: [facility];  Service: Endoscopy;  Laterality: N/A;  . 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 ESOPHAGOGASTRODUODENOSCOPY (EGD) WITH PROPOFOL N/A 11/25/2016   Procedure: ESOPHAGOGASTRODUODENOSCOPY (EGD) WITH PROPOFOL;  Surgeon: [name], MD;   Location: [facility];  Service: Endoscopy;  Laterality: N/A;  . EUS N/A 08/08/2015   Procedure: UPPER ENDOSCOPIC ULTRASOUND (EUS) RADIAL;  Surgeon: [name], MD;  Location: [facility];  Service: Endoscopy;  Laterality: N/A;  . INGUINAL HERNIA REPAIR     right  . IR PERC CHOLECYSTOSTOMY  05/09/2018  . LAPAROTOMY N/A 11/27/2016   Procedure: GASTROJEJUNOSTOMY;  Surgeon: Leighton Ruff, MD;  Location: [facility];  Service: General;  Laterality: N/A;  . PANCREAS BIOPSY  11/27/2016   Procedure: PANCREACTIC BIOPSY;  Surgeon: Leighton Ruff, MD;  Location: [facility];  Service: General;;  . Bess Kinds CHOLANGIOSCOPY N/A 12/26/2015   Procedure: CNOBSJGG CHOLANGIOSCOPY;  Surgeon: [name], MD;  Location: [facility];  Service: Endoscopy;  Laterality: N/A;  . TONSILLECTOMY     SOCIAL HISTORY: Social History   Socioeconomic History  . Marital status: Married    Spouse name: Not on file  . Number of children: 2  . Years of education: Not on file  . Highest education level: Not on file  Occupational History  . Occupation: Retired  Scientific laboratory technician  . Financial resource strain: Not on file  . Food insecurity:    Worry: Not on file    Inability: Not on file  . Transportation needs:    Medical: Not on file  Non-medical: Not on file  Tobacco Use  . Smoking status: Former Smoker    Packs/day: 1.00    Years: 10.00    Pack years: 10.00    Types: Cigarettes    Last attempt to quit: 1957    Years since quitting: 62.8  . Smokeless tobacco: Never Used  Substance and Sexual Activity  . Alcohol use: No    Comment: no recent   . Drug use: No  . Sexual activity: Not on file  Lifestyle  . Physical activity:    Days per week: Not on file    Minutes per session: Not on file  . Stress: Not on file  Relationships  . Social connections:    Talks on phone: Not on file    Gets together: Not on file    Attends religious service: Not on file    Active member of club or organization: Not on file     Attends meetings of clubs or organizations: Not on file    Relationship status: Not on file  . Intimate partner violence:    Fear of current or ex partner: Not on file    Emotionally abused: Not on file    Physically abused: Not on file    Forced sexual activity: Not on file  Other Topics Concern  . Not on file  Social History Narrative  . Not on file   FAMILY HISTORY: Family History  Problem Relation Age of Onset  . Breast cancer Sister 12       treated with radiation  . Heart disease Father   . Colon cancer Neg Hx    ALLERGIES:  is allergic to sulfamethoxazole.  MEDICATIONS:  Current Outpatient Medications  Medication Sig Dispense Refill  . apixaban (ELIQUIS) 2.5 MG TABS tablet Take 1 tablet (2.5 mg total) by mouth 2 (two) times daily. 60 tablet   . cefUROXime (CEFTIN) 500 MG tablet Take 500 mg by mouth 2 (two) times daily with a meal.    . diltiazem (CARDIZEM) 60 MG tablet Take 1 tablet (60 mg total) by mouth every 12 (twelve) hours.    . furosemide (LASIX) 20 MG tablet Take 1 tablet (20 mg total) by mouth every Monday, Wednesday, and Friday. 30 tablet   . hydrocortisone (ANUSOL-HC) 2.5 % rectal cream Place 1 application rectally 2 (two) times daily as needed for hemorrhoids or anal itching. 30 g 0  . hydrocortisone (ANUSOL-HC) 25 MG suppository Place 25 mg rectally 2 (two) times daily as needed for hemorrhoids or anal itching.    . lactulose (CEPHULAC) 10 g packet Take 10 g by mouth daily.    Marland Kitchen levothyroxine (SYNTHROID, LEVOTHROID) 25 MCG tablet Take 25 mcg by mouth daily before breakfast.    . lipase/protease/amylase (CREON) 12000 units CPEP capsule Take 2 capsules (24,000 Units total) by mouth 3 (three) times daily before meals. (Patient taking differently: Take 24,000 Units by mouth 2 (two) times daily. ) 540 capsule 1  . metoprolol tartrate (LOPRESSOR) 25 MG tablet Take 0.5 tablets (12.5 mg total) by mouth daily.    . Multiple Vitamins-Minerals (MULTIVITAMIN GUMMIES ADULT)  CHEW Chew 1 tablet by mouth daily.    . ondansetron (ZOFRAN) 4 MG tablet Take 1 tablet (4 mg total) by mouth every 6 (six) hours as needed for nausea. 20 tablet 0  . pantoprazole sodium (PROTONIX) 40 mg/20 mL PACK Take 20 mLs (40 mg total) by mouth daily at 12 noon. 30 each   . traMADol (ULTRAM) 50 MG tablet  Take 1 tablet (50 mg total) by mouth every 6 (six) hours as needed for moderate pain. 10 tablet 0  . Cholecalciferol (VITAMIN D3) 400 units CAPS Take 2 capsules by mouth daily.    Marland Kitchen LORazepam (ATIVAN) 0.5 MG tablet Take 0.5 tablets (0.25 mg total) by mouth every 6 (six) hours as needed for anxiety (or nausea). (Patient not taking: Reported on 06/09/2018) 10 tablet 0  . polyethylene glycol powder (GLYCOLAX/MIRALAX) powder Take 17 g by mouth daily as needed for mild constipation.      No current facility-administered medications for this visit.    Facility-Administered Medications Ordered in Other Visits  Medication Dose Route Frequency Provider Last Rate Last Dose  . lidocaine (XYLOCAINE) 1 % (with pres) injection           . lidocaine (XYLOCAINE) 1 % (with pres) injection   Infiltration PRN Jacqulynn Cadet, MD   5 mL at 06/09/18 1846   REVIEW OF SYSTEMS:   Constitutional: Denies fevers, chills or abnormal night sweats. (+) weight gain Eyes: Denies blurriness of vision, double vision or watery eyes Ears, nose, mouth, throat, and face: Denies mucositis or sore throat Respiratory: Denies dyspnea or wheezes (+) chronic dry cough, secondary to lisinopril Cardiovascular: Denies palpitation, chest discomfort or lower extremity swelling Gastrointestinal:  Denies nausea, heartburn or change in bowel habits (+) bloating (+) diffuse abdominal pain, worsening (+) Gallbladder drainage tube  Skin: Denies abnormal skin rashes Lymphatics: Denies new lymphadenopathy or easy bruising Neurological:Denies numbness, tingling or new weaknesses Behavioral/Psych: Mood is stable, no new changes  All other  systems were reviewed with the patient and are negative.  PHYSICAL EXAMINATION:  ECOG PERFORMANCE STATUS: 3  Vitals:   06/09/18 1519  BP: (!) 142/75  Pulse: 78  Resp: 18  Temp: 98.3 F (36.8 C)  SpO2: 96%   Filed Weights   06/09/18 1519  Weight: 98 lb 8 oz (44.7 kg)     GENERAL:alert, no distress and comfortable (+) wheelchair (+) O2 canula SKIN: skin color, texture, turgor are normal, no rashes or significant lesions EYES: normal, conjunctiva are pink and non-injected, sclera clear OROPHARYNX:no exudate, no erythema and lips, buccal mucosa, and tongue normal  NECK: supple, thyroid normal size, non-tender, without nodularity LYMPH:  no palpable lymphadenopathy in the cervical, axillary or inguinal LUNGS: clear to auscultation (+) bilateral dullness on percussion with normal breathing effort (+) O2 canula HEART: regular rate & rhythm and no murmurs and no lower extremity edema ABDOMEN:abdomen soft and normal bowel sounds.  Abdomen is significantly distended, she has a percutaneous drain tube in the right upper quadrant, with yellowish fluids stain at the covering gauze.  (+) moderate ascitics (+) generalized abdominal pain  Musculoskeletal:no cyanosis of digits and no clubbing  PSYCH: alert & oriented x 3 with fluent speech NEURO: no focal motor/sensory deficits  LABORATORY DATA:  I have reviewed the data as listed CBC Latest Ref Rng & Units 06/09/2018 05/19/2018 05/16/2018  WBC 4.0 - 10.5 K/uL 8.0 16.6(H) 14.8(H)  Hemoglobin 12.0 - 15.0 g/dL 9.9(L) 10.2(L) 10.1(L)  Hematocrit 36.0 - 46.0 % 31.8(L) 31.3(L) 30.1(L)  Platelets 150 - 400 K/uL 301 409(H) 505(H)   CMP Latest Ref Rng & Units 06/09/2018 05/24/2018 05/22/2018  Glucose 70 - 99 mg/dL 185(H) 118(H) 128(H)  BUN 8 - 23 mg/dL _0 Creatinine 0.44 - 1.00 mg/dL 0.57 0.46 0.42(L)  Sodium 135 - 145 mmol/L 133(L) 136 129(L)  Potassium 3.5 - 5.1 mmol/L 3.6 3.9 4.3  Chloride 98 -  111 mmol/L 91(L) 94(L) 92(L)  CO2 22 - 32  mmol/L 34(H) 34(H) 31  Calcium 8.9 - 10.3 mg/dL 8.5(L) 8.3(L) 7.9(L)  Total Protein 6.5 - 8.1 g/dL 6.5 - -  Total Bilirubin 0.3 - 1.2 mg/dL 0.3 - -  Alkaline Phos 38 - 126 U/L 244(H) - -  AST 15 - 41 U/L 20 - -  ALT 0 - 44 U/L 20 - -   PATHOLOGY  Diagnosis 05/19/17 Pancreas, biopsy - ADENOCARCINOMA. - SEE COMMENT. Microscopic Comment Dr. Claudette Laws has reviewed the case and concurs with this interpretation. Dr. Burr Medico was paged on 05/20/2017. Per request, MMR by [facility] testing will be performed and the results reported separately. (JBK:ah 05/20/17)   Diagnosis 11/27/2016 Pancreas, biopsy, mass SMALL FOCUS OF ATYPICAL GLANDS, SUSPICIOUS FOR INFILTRATING ADENOCARCINOMA. Microscopic Comment Multiple levels of tissue has been exam, due to limited atypical cells, a definitive diagnosis can not be reached. This case also reviewed by Drs. Saralyn Pilar and Smir and agree.  RADIOGRAPHIC STUDIES: I have personally reviewed the radiological images as listed and agreed with the findings in the report.  05/08/2018 US Abdomen IMPRESSION: Distended gallbladder with gallbladder wall thickening, pericholecystic fluid and positive sonographic Murphy sign compatible with acute cholecystitis. No biliary dilatation.  Small amount of ascites.   05/08/2018 CT AP IMPRESSION: 1. Small amount of abdominal and pelvic ascites, new since previous. 2. Marked distention of the urinary bladder 3. New small bilateral pleural effusions 4. Persistent gallbladder dilatation with thickened enhancing gallbladder wall, suggesting cholecystitis. 5. New low-attenuation liver lesions suggesting metastatic disease.  Dg Abd 1 View  Result Date: 05/18/2018 CLINICAL DATA:  Dyspnea and abdominal pain EXAM: ABDOMEN - 1 VIEW COMPARISON:  CT 05/08/2018 FINDINGS: Small pleural effusions and basilar consolidation. Biliary stent. Placement of cholecystostomy tube in the right abdomen. Mild diffuse increased bowel gas. Postsurgical  changes in the left abdomen. Scoliosis of the spine. IMPRESSION: 1. Placement of right-sided cholecystostomy tube. Mild diffuse increased bowel gas, possible mild ileus 2. Pleural effusions and basilar consolidations. Electronically Signed   By: Donavan Foil M.D.   On: 05/18/2018 17:05   Dg Chest Port 1v Same Day  Result Date: 05/18/2018 CLINICAL DATA:  Dyspnea EXAM: PORTABLE CHEST 1 VIEW COMPARISON:  03/05/2010, CT 05/08/2018 FINDINGS: Small bila right greater than left pleural effusions. Bibasilar consolidations. Enlarged cardiomediastinal silhouette with vascular congestion and pulmonary edema. Bulky distortion of the right hilar contour, suspicious for a mass or adenopathy. Aortic atherosclerosis. No pneumothorax. Hiatal hernia. IMPRESSION: 1. Right greater than left pleural effusions with bibasilar consolidations. Cardiomegaly with vascular congestion and pulmonary edema 2. Bulky distortion of the right hilar region suspect for mass and/or adenopathy. Recommend contrast-enhanced CT to further evaluate. Electronically Signed   By: Donavan Foil M.D.   On: 05/18/2018 17:07   ASSESSMENT & PLAN:  Summer Jones is a 84 y.o.  female with arthritis, HTN, thyroid disease, history of bilary duct stent, and pancreas mass suspicious for adenocarcinoma.   1. Pancreatic adenocarcinoma in head, cT2N0Mo, stage IB, MMR normal, untreated, likely metastatic now  -I previously reviewed the patient's medical records extensively with the patient and family.  -She had multiple bile duct brushings, cytologies were negative. Her last fine-needle biopsy of the pancreatic mass during her gastric bypass surgery for gastric outlet obstructions in April 2018 showed small cluster of atypical cells, suspicious for adenocarcinoma, but not definitive. -she finally underwent CT-guided biopsy of the pancreatic mass, which confirmed adenocarcinoma  -Her cancer appears to be indolent, mostly asymptomatic  over the past two years.    -surgery is not a likely option due to the location of her tumor and her advanced age, although there is no clear vascular invasion by the tumor on the CT scan.  -The goal of therapy is palliative, if no surgery. We previously discussed various options including palliative radiation and chemotherapy -Chemotherapy is a possible option to control symptoms and improve quality of life, such as single agent Gemcitabine  -I previously discussed that her MMR came back normal which means she is not eligable for immunotherapy  -Due to her advanced age, relatively asymptomatic, she has decided not to pursue any anticancer treatment or surgery. -She developed worsening abdominal pain lately, exam from 10/19/17 showed a 2-3 cm palpable mass in the right umbilical area.  I recommended a CT abdomen and pelvis with contrast to rule out metastasis. -I called pt and discussed CT AP W Contrast from 11/02/17 findings with her. The primary pancreatic mass has increased in size, but no evidence of metastasis.  I think part of her abdominal pain is related to her constipation, CT scan did show moderate to large volume of stool in the colon. She voiced good understanding, and appreciated the call. -Pt went to the ED on 12/14/17 with complaints of abdominal pain, her CT AP W Contrast was unchanged from last scan.  -Her tumor marker CA19.9 is slowly increasing, 990 on 10/19/17. I advised her to call if she has any new symptoms or concerns.  Her cancer is overall indolent. -She was hospitalized for acute cholecystitis 1 months ago, felt to be a poor candidate for surgery, underwent percutaneous gallbladder drainage tube placement.  Her CT scan at that time showed a new liver lesion, likely metastasis.  She has also developed ascites and bilateral pleural effusion, concerning for malignant ascites. -Her overall performance status has significantly decreased, she is on oxygen, most time wheelchair-bound.  She is meeting palliative care  and hospice tomorrow, I recommend hospice to manage her symptoms.  Patient and her family agrees. -Due to the increased bursitis, I will order abdominal ultrasound and paracentesis.  I will also get a chest x-ray to see if he needs thoracentesis -She's been complaining of generalized abdominal pain that is worsening. She is noted to have moderate ascites.  She is taking tramadol as needed, but complains of lightheadedness, I gave her a prescription of Norco today, will start a low dose.  3. Ascites and pleural effusion  -Likely malignant, started about months ago -I will order abdominal ultrasound and paracentesis, and x-ray today to see if she needs thoracentesis  4. Goal of care discussion  -We again discussed the incurable nature of her cancer, and the overall poor prognosis, especially she has had rapid progression in metastatic disease now. -The patient understands the goal of care is palliative. -I recommend hospice, she is scheduled to meet palliative care and hospice team tomorrow.  Plan: -I will contact IR to schedule abdominal US and paracenthesis, will get chest x-ray today to see if she needs thoracocentesis  -Due to the leakage from the gallbladder drain tube, I spoke with IR PA, and sent her to [facility] long radiology for further evaluation. -f/u open, she will discuss with palliative care and hospice tomorrow, likely transition to hospice care.  Orders Placed This Encounter  Procedures  . US Abdomen Complete    Standing Status:   Future    Standing Expiration Date:   06/09/2019    Order Specific Question:  Reason for Exam (SYMPTOM  OR DIAGNOSIS REQUIRED)    Answer:   evaluate liver lesion/metastasis, gallbladder, and ascites    Order Specific Question:   Preferred imaging location?    Answer:   [facility]  . DG Chest 2 View    Standing Status:   Future    Number of Occurrences:   1    Standing Expiration Date:   06/09/2019    Order Specific Question:   Reason  for Exam (SYMPTOM  OR DIAGNOSIS REQUIRED)    Answer:   evaluate pleural effusion    Order Specific Question:   Preferred imaging location?    Answer:   [facility]    Order Specific Question:   Radiology Contrast Protocol - do NOT remove file path    Answer:   \\[email or web address]   All questions were answered. The patient knows to call the clinic with any problems, questions or concerns.  I spent 30 minutes counseling the patient face to face. The total time spent in the appointment was 40 minutes and more than 50% was on counseling.  Dierdre Searles Dweik am acting as scribe for Dr. Truitt Merle.  I have reviewed the above documentation for accuracy and completeness, and I agree with the above.     Truitt Merle, MD 06/09/2018 7:20 PM

## 2018-06-09 ENCOUNTER — Inpatient Hospital Stay (HOSPITAL_BASED_OUTPATIENT_CLINIC_OR_DEPARTMENT_OTHER): Payer: Medicare Other | Admitting: Hematology

## 2018-06-09 ENCOUNTER — Other Ambulatory Visit (HOSPITAL_COMMUNITY): Payer: Self-pay | Admitting: Student

## 2018-06-09 ENCOUNTER — Inpatient Hospital Stay: Payer: Medicare Other | Attending: Hematology

## 2018-06-09 ENCOUNTER — Encounter: Payer: Self-pay | Admitting: Hematology

## 2018-06-09 ENCOUNTER — Ambulatory Visit: Payer: Medicare Other

## 2018-06-09 ENCOUNTER — Ambulatory Visit (HOSPITAL_COMMUNITY)
Admission: RE | Admit: 2018-06-09 | Discharge: 2018-06-09 | Disposition: A | Discharge status: Home / Self Care | Payer: Medicare Other | Source: Ambulatory Visit | Attending: Student | Admitting: Student

## 2018-06-09 ENCOUNTER — Ambulatory Visit (HOSPITAL_COMMUNITY)
Admission: RE | Admit: 2018-06-09 | Discharge: 2018-06-09 | Disposition: A | Discharge status: Home / Self Care | Payer: Medicare Other | Source: Ambulatory Visit | Attending: Hematology | Admitting: Hematology

## 2018-06-09 VITALS — BP 142/75 | HR 78 | Temp 98.3°F | Resp 18 | Ht 59.0 in | Wt 98.5 lb

## 2018-06-09 DIAGNOSIS — C259 Malignant neoplasm of pancreas, unspecified: Secondary | ICD-10-CM

## 2018-06-09 DIAGNOSIS — C250 Malignant neoplasm of head of pancreas: Secondary | ICD-10-CM

## 2018-06-09 DIAGNOSIS — Z9049 Acquired absence of other specified parts of digestive tract: Secondary | ICD-10-CM

## 2018-06-09 DIAGNOSIS — K769 Liver disease, unspecified: Secondary | ICD-10-CM | POA: Insufficient documentation

## 2018-06-09 DIAGNOSIS — Z79899 Other long term (current) drug therapy: Secondary | ICD-10-CM | POA: Insufficient documentation

## 2018-06-09 DIAGNOSIS — K5900 Constipation, unspecified: Secondary | ICD-10-CM | POA: Diagnosis not present

## 2018-06-09 DIAGNOSIS — Z87891 Personal history of nicotine dependence: Secondary | ICD-10-CM | POA: Insufficient documentation

## 2018-06-09 DIAGNOSIS — R188 Other ascites: Secondary | ICD-10-CM | POA: Diagnosis not present

## 2018-06-09 DIAGNOSIS — K810 Acute cholecystitis: Secondary | ICD-10-CM

## 2018-06-09 DIAGNOSIS — I700 Atherosclerosis of aorta: Secondary | ICD-10-CM | POA: Diagnosis not present

## 2018-06-09 DIAGNOSIS — I10 Essential (primary) hypertension: Secondary | ICD-10-CM

## 2018-06-09 DIAGNOSIS — I341 Nonrheumatic mitral (valve) prolapse: Secondary | ICD-10-CM | POA: Diagnosis not present

## 2018-06-09 DIAGNOSIS — E039 Hypothyroidism, unspecified: Secondary | ICD-10-CM | POA: Diagnosis not present

## 2018-06-09 DIAGNOSIS — J90 Pleural effusion, not elsewhere classified: Secondary | ICD-10-CM | POA: Insufficient documentation

## 2018-06-09 DIAGNOSIS — Z4682 Encounter for fitting and adjustment of non-vascular catheter: Secondary | ICD-10-CM | POA: Insufficient documentation

## 2018-06-09 DIAGNOSIS — I4891 Unspecified atrial fibrillation: Secondary | ICD-10-CM

## 2018-06-09 DIAGNOSIS — Z7901 Long term (current) use of anticoagulants: Secondary | ICD-10-CM

## 2018-06-09 HISTORY — PX: IR EXCHANGE BILIARY DRAIN: IMG6046

## 2018-06-09 LAB — COMPREHENSIVE METABOLIC PANEL
ALBUMIN: 2.5 g/dL — AB (ref 3.5–5.0)
ALT: 20 U/L (ref 0–44)
AST: 20 U/L (ref 15–41)
Alkaline Phosphatase: 244 U/L — ABNORMAL HIGH (ref 38–126)
Anion gap: 8 (ref 5–15)
BILIRUBIN TOTAL: 0.3 mg/dL (ref 0.3–1.2)
BUN: 8 mg/dL (ref 8–23)
CALCIUM: 8.5 mg/dL — AB (ref 8.9–10.3)
CO2: 34 mmol/L — ABNORMAL HIGH (ref 22–32)
Chloride: 91 mmol/L — ABNORMAL LOW (ref 98–111)
Creatinine, Ser: 0.57 mg/dL (ref 0.44–1.00)
GFR calc Af Amer: >60 mL/min (ref >60)
GLUCOSE: 185 mg/dL — AB (ref 70–99)
POTASSIUM: 3.6 mmol/L (ref 3.5–5.1)
Sodium: 133 mmol/L — ABNORMAL LOW (ref 135–145)
TOTAL PROTEIN: 6.5 g/dL (ref 6.5–8.1)

## 2018-06-09 LAB — CBC WITH DIFFERENTIAL/PLATELET
ABS IMMATURE GRANULOCYTES: 0.03 10*3/uL (ref 0.00–0.07)
BASOS PCT: 0 %
Basophils Absolute: 0 10*3/uL (ref 0.0–0.1)
Eosinophils Absolute: 0 10*3/uL (ref 0.0–0.5)
Eosinophils Relative: 0 %
HCT: 31.8 % — ABNORMAL LOW (ref 36.0–46.0)
Hemoglobin: 9.9 g/dL — ABNORMAL LOW (ref 12.0–15.0)
IMMATURE GRANULOCYTES: 0 %
Lymphocytes Relative: 5 %
Lymphs Abs: 0.4 10*3/uL — ABNORMAL LOW (ref 0.7–4.0)
MCH: 29.5 pg (ref 26.0–34.0)
MCHC: 31.1 g/dL (ref 30.0–36.0)
MCV: 94.6 fL (ref 80.0–100.0)
MONOS PCT: 9 %
Monocytes Absolute: 0.7 10*3/uL (ref 0.1–1.0)
NEUTROS ABS: 6.9 10*3/uL (ref 1.7–7.7)
NEUTROS PCT: 86 %
PLATELETS: 301 10*3/uL (ref 150–400)
RBC: 3.36 MIL/uL — AB (ref 3.87–5.11)
RDW: 17.5 % — ABNORMAL HIGH (ref 11.5–15.5)
WBC: 8 10*3/uL (ref 4.0–10.5)
nRBC: 0 % (ref 0.0–0.2)

## 2018-06-09 MED ORDER — LIDOCAINE HCL 1 % IJ SOLN
INTRAMUSCULAR | Status: AC
  Filled 2018-06-09: qty 20

## 2018-06-09 MED ORDER — IOPAMIDOL (ISOVUE-300) INJECTION 61%
INTRAVENOUS | Status: AC
  Administered 2018-06-09: 10 mL
  Filled 2018-06-09: qty 50

## 2018-06-09 MED ORDER — LIDOCAINE HCL 1 % IJ SOLN
INTRAMUSCULAR | Status: DC | PRN
  Administered 2018-06-09: 5 mL

## 2018-06-10 ENCOUNTER — Ambulatory Visit: Payer: Medicare Other

## 2018-06-10 ENCOUNTER — Telehealth: Payer: Self-pay | Admitting: Hematology

## 2018-06-10 ENCOUNTER — Encounter (HOSPITAL_COMMUNITY): Payer: Self-pay | Admitting: Interventional Radiology

## 2018-06-10 ENCOUNTER — Telehealth: Payer: Self-pay

## 2018-06-10 ENCOUNTER — Other Ambulatory Visit (HOSPITAL_COMMUNITY): Payer: Medicare Other

## 2018-06-10 LAB — CANCER ANTIGEN 19-9: CAN 19-9: 7444 U/mL — AB (ref 0–35)

## 2018-06-10 NOTE — Telephone Encounter (Signed)
Spoke with patient gave her information about appointment for abdominal US and US paracentesis for Thursday 10/31 at 10:00 to arrive by 9:40, nothing to eat or drink past midnight.  Patient verbalized an understanding.

## 2018-06-10 NOTE — Telephone Encounter (Signed)
No los 10/24

## 2018-06-13 ENCOUNTER — Other Ambulatory Visit (HOSPITAL_COMMUNITY): Payer: Medicare Other

## 2018-06-13 ENCOUNTER — Ambulatory Visit: Payer: Medicare Other

## 2018-06-14 ENCOUNTER — Ambulatory Visit: Payer: Medicare Other

## 2018-06-14 ENCOUNTER — Telehealth: Payer: Self-pay

## 2018-06-14 NOTE — Telephone Encounter (Signed)
-----   Message from Truitt Merle, MD sent at 06/12/2018  7:50 PM EDT ----- Please let pt's sone know the CXR result, she has moderate pleural effusion, probably does not need thoracentesis for now, may need it down the road. Please ask how the meeting with hospice team go, if anything else we can do for her. Thanks   Truitt Merle  06/12/2018

## 2018-06-14 NOTE — Telephone Encounter (Signed)
Spoke with patient's husband informed him that CXR show moderate pleural infusion, per Dr. Burr Medico does not think she needs thoracentesis at this time.  He didn't have a lot of information regarding Hospice.

## 2018-06-15 ENCOUNTER — Ambulatory Visit: Payer: Medicare Other

## 2018-06-16 ENCOUNTER — Ambulatory Visit (HOSPITAL_COMMUNITY)
Admission: RE | Admit: 2018-06-16 | Discharge: 2018-06-16 | Disposition: A | Discharge status: Home / Self Care | Payer: Medicare Other | Source: Ambulatory Visit | Attending: Radiology | Admitting: Radiology

## 2018-06-16 ENCOUNTER — Ambulatory Visit (HOSPITAL_COMMUNITY)
Admission: RE | Admit: 2018-06-16 | Discharge: 2018-06-16 | Disposition: A | Discharge status: Home / Self Care | Payer: Medicare Other | Source: Ambulatory Visit | Attending: Hematology | Admitting: Hematology

## 2018-06-16 ENCOUNTER — Encounter (HOSPITAL_COMMUNITY): Payer: Self-pay

## 2018-06-16 ENCOUNTER — Ambulatory Visit: Payer: Medicare Other

## 2018-06-16 ENCOUNTER — Other Ambulatory Visit: Payer: Self-pay

## 2018-06-16 DIAGNOSIS — J939 Pneumothorax, unspecified: Secondary | ICD-10-CM | POA: Insufficient documentation

## 2018-06-16 DIAGNOSIS — K7689 Other specified diseases of liver: Secondary | ICD-10-CM | POA: Insufficient documentation

## 2018-06-16 DIAGNOSIS — C250 Malignant neoplasm of head of pancreas: Secondary | ICD-10-CM

## 2018-06-16 DIAGNOSIS — Z9889 Other specified postprocedural states: Secondary | ICD-10-CM

## 2018-06-16 DIAGNOSIS — R932 Abnormal findings on diagnostic imaging of liver and biliary tract: Secondary | ICD-10-CM | POA: Diagnosis not present

## 2018-06-16 DIAGNOSIS — R188 Other ascites: Secondary | ICD-10-CM | POA: Diagnosis not present

## 2018-06-16 DIAGNOSIS — J90 Pleural effusion, not elsewhere classified: Secondary | ICD-10-CM | POA: Insufficient documentation

## 2018-06-16 MED ORDER — LIDOCAINE HCL 1 % IJ SOLN
INTRAMUSCULAR | Status: AC
  Filled 2018-06-16: qty 10

## 2018-06-16 NOTE — Procedures (Signed)
Ultrasound-guided therapeutic right thoracentesis performed yielding 900 cc of yellow  fluid. No immediate complications. Follow-up chest x-ray pending. There was only minimal ascites present on today's [place] abd, therefore paracentesis was not performed. Above d/w Dr. Ernestina Penna office.

## 2018-06-16 NOTE — Progress Notes (Signed)
thor

## 2018-06-16 NOTE — Progress Notes (Signed)
Patient ID: Summer Jones, female   DOB: 1930-02-02, 84 y.o.   MRN: [id number] Pt's f/u CXR post rt thoracentesis shows rt ptx, ?exvacuole; pt currently without sig CP, worsening dyspnea; does have some mild chest "heaviness"; case reviewed by Dr. Earleen Newport and will plan repeat film in 2 hrs; pt/family updated

## 2018-06-16 NOTE — Progress Notes (Signed)
Patient ID: Summer Jones, female   DOB: 12-16-1929, 84 y.o.   MRN: [id number] Patient's follow-up chest x-ray shows stable unchanged right apical pneumothorax from earlier today with improved gas component at the right lung base.  She is clinically stable with no worsening chest pain/dyspnea.  Findings discussed with Dr. Earleen Newport and will plan to discharge patient home with instructions to return to the ED if she experiences worsening chest pain or significant dyspnea.

## 2018-06-17 ENCOUNTER — Ambulatory Visit: Payer: Medicare Other

## 2018-06-20 ENCOUNTER — Ambulatory Visit: Payer: Medicare Other

## 2018-06-21 ENCOUNTER — Ambulatory Visit: Payer: Medicare Other

## 2018-06-22 ENCOUNTER — Ambulatory Visit: Payer: Medicare Other

## 2018-06-23 ENCOUNTER — Telehealth: Payer: Self-pay | Admitting: Physician Assistant

## 2018-06-23 ENCOUNTER — Ambulatory Visit: Payer: Medicare Other

## 2018-06-23 NOTE — Telephone Encounter (Signed)
Spoke with Hospice RN Emmamarie Kluender [phone]) today regarding questions about cholecystostomy care and follow-up appointments. Instructed to continue QD flushes with 3-5 cc NS, record output daily, monitor insertion site for signs of infection/fluid leakage with flushing and to change dressing 1-2 days or earlier if wet/soiled.   Gave Ms. Scott patient's next follow up appointment in IR at [facility] which is 07/11/18 at 11:30 am (patient to arrive at 11 am) for cholangiogram and possible drain exchange.   All questions answered, encouraged to call IR with further questions or concerns.  Candiss Norse, PA-C

## 2018-06-24 ENCOUNTER — Ambulatory Visit: Payer: Medicare Other

## 2018-07-11 ENCOUNTER — Inpatient Hospital Stay (HOSPITAL_COMMUNITY): Admission: RE | Admit: 2018-07-11 | Payer: Medicare Other | Source: Ambulatory Visit

## 2018-07-17 DEATH — deceased

## 2018-08-23 NOTE — Progress Notes (Signed)
  Radiation Oncology         ([phone]) [phone] ________________________________  Name: Summer Jones MRN: [id number]  Date: 04/29/2018  DOB: 03/18/1930  SIMULATION AND TREATMENT PLANNING NOTE  DIAGNOSIS:     ICD-10-CM   1. Malignant neoplasm of head of pancreas ([place]) C25.0      Site:  pancreas  NARRATIVE:  The patient was brought to the [facility].  Identity was confirmed.  All relevant records and images related to the planned course of therapy were reviewed.   Written consent to proceed with treatment was confirmed which was freely given after reviewing the details related to the planned course of therapy had been reviewed with the patient.  Then, the patient was set-up in a stable reproducible  supine position for radiation therapy.  CT images were obtained.  Surface markings were placed.    Medically necessary complex treatment device(s) for immobilization:  Vac-lock bag.   The CT images were loaded into the planning software.  Then the target and avoidance structures were contoured.  Treatment planning then occurred.  The radiation prescription was entered and confirmed.  A total of 7 complex treatment devices were fabricated which relate to the designed radiation treatment fields. Each of these customized fields/ complex treatment devices will be used on a daily basis during the radiation course. I have requested : 3D Simulation  I have requested a DVH of the following structures: targert volume, cord, left kidney, right kidney, liver.   PLAN:  The patient will receive 37.5 Gy in 15 fractions.  ________________________________   Jodelle Gross, MD, PhD
